# Patient Record
Sex: Female | Born: 1935 | Race: White | Hispanic: No | Marital: Married | Smoking: Former smoker
Health system: Southern US, Community
[De-identification: ages and names within clinical notes are randomized; demographics above are authoritative.]

## PROBLEM LIST (undated history)

## (undated) DIAGNOSIS — K5520 Angiodysplasia of colon without hemorrhage: Secondary | ICD-10-CM

## (undated) DIAGNOSIS — G500 Trigeminal neuralgia: Secondary | ICD-10-CM

## (undated) DIAGNOSIS — L03116 Cellulitis of left lower limb: Secondary | ICD-10-CM

## (undated) DIAGNOSIS — N183 Chronic kidney disease, stage 3 unspecified: Secondary | ICD-10-CM

## (undated) DIAGNOSIS — D369 Benign neoplasm, unspecified site: Secondary | ICD-10-CM

## (undated) DIAGNOSIS — I739 Peripheral vascular disease, unspecified: Secondary | ICD-10-CM

## (undated) DIAGNOSIS — I429 Cardiomyopathy, unspecified: Secondary | ICD-10-CM

## (undated) DIAGNOSIS — A498 Other bacterial infections of unspecified site: Secondary | ICD-10-CM

## (undated) DIAGNOSIS — Q279 Congenital malformation of peripheral vascular system, unspecified: Secondary | ICD-10-CM

## (undated) DIAGNOSIS — T8859XA Other complications of anesthesia, initial encounter: Secondary | ICD-10-CM

## (undated) DIAGNOSIS — I10 Essential (primary) hypertension: Secondary | ICD-10-CM

## (undated) DIAGNOSIS — C539 Malignant neoplasm of cervix uteri, unspecified: Secondary | ICD-10-CM

## (undated) DIAGNOSIS — E1122 Type 2 diabetes mellitus with diabetic chronic kidney disease: Secondary | ICD-10-CM

## (undated) DIAGNOSIS — I509 Heart failure, unspecified: Secondary | ICD-10-CM

## (undated) DIAGNOSIS — D509 Iron deficiency anemia, unspecified: Secondary | ICD-10-CM

## (undated) DIAGNOSIS — E785 Hyperlipidemia, unspecified: Secondary | ICD-10-CM

## (undated) DIAGNOSIS — R945 Abnormal results of liver function studies: Secondary | ICD-10-CM

## (undated) DIAGNOSIS — M1990 Unspecified osteoarthritis, unspecified site: Secondary | ICD-10-CM

## (undated) DIAGNOSIS — D7589 Other specified diseases of blood and blood-forming organs: Secondary | ICD-10-CM

## (undated) DIAGNOSIS — I2510 Atherosclerotic heart disease of native coronary artery without angina pectoris: Secondary | ICD-10-CM

## (undated) DIAGNOSIS — Z9289 Personal history of other medical treatment: Secondary | ICD-10-CM

## (undated) DIAGNOSIS — K589 Irritable bowel syndrome without diarrhea: Secondary | ICD-10-CM

## (undated) DIAGNOSIS — R0989 Other specified symptoms and signs involving the circulatory and respiratory systems: Secondary | ICD-10-CM

## (undated) DIAGNOSIS — K760 Fatty (change of) liver, not elsewhere classified: Secondary | ICD-10-CM

## (undated) DIAGNOSIS — Z720 Tobacco use: Secondary | ICD-10-CM

## (undated) DIAGNOSIS — I350 Nonrheumatic aortic (valve) stenosis: Secondary | ICD-10-CM

## (undated) DIAGNOSIS — K219 Gastro-esophageal reflux disease without esophagitis: Secondary | ICD-10-CM

## (undated) DIAGNOSIS — J449 Chronic obstructive pulmonary disease, unspecified: Secondary | ICD-10-CM

## (undated) DIAGNOSIS — M109 Gout, unspecified: Secondary | ICD-10-CM

## (undated) DIAGNOSIS — T4145XA Adverse effect of unspecified anesthetic, initial encounter: Secondary | ICD-10-CM

## (undated) DIAGNOSIS — K279 Peptic ulcer, site unspecified, unspecified as acute or chronic, without hemorrhage or perforation: Secondary | ICD-10-CM

## (undated) DIAGNOSIS — N6019 Diffuse cystic mastopathy of unspecified breast: Secondary | ICD-10-CM

## (undated) DIAGNOSIS — H348122 Central retinal vein occlusion, left eye, stable: Secondary | ICD-10-CM

## (undated) DIAGNOSIS — L03115 Cellulitis of right lower limb: Secondary | ICD-10-CM

## (undated) DIAGNOSIS — K922 Gastrointestinal hemorrhage, unspecified: Secondary | ICD-10-CM

## (undated) DIAGNOSIS — R609 Edema, unspecified: Secondary | ICD-10-CM

## (undated) DIAGNOSIS — Z87891 Personal history of nicotine dependence: Secondary | ICD-10-CM

## (undated) DIAGNOSIS — R7989 Other specified abnormal findings of blood chemistry: Secondary | ICD-10-CM

## (undated) HISTORY — DX: Angiodysplasia of colon without hemorrhage: K55.20

## (undated) HISTORY — DX: Cellulitis of right lower limb: L03.115

## (undated) HISTORY — PX: NASAL SINUS SURGERY: SHX719

## (undated) HISTORY — DX: Other bacterial infections of unspecified site: A49.8

## (undated) HISTORY — DX: Fatty (change of) liver, not elsewhere classified: K76.0

## (undated) HISTORY — DX: Gastro-esophageal reflux disease without esophagitis: K21.9

## (undated) HISTORY — DX: Type 2 diabetes mellitus with diabetic chronic kidney disease: E11.22

## (undated) HISTORY — DX: Congenital malformation of peripheral vascular system, unspecified: Q27.9

## (undated) HISTORY — DX: Iron deficiency anemia, unspecified: D50.9

## (undated) HISTORY — DX: Chronic kidney disease, stage 3 unspecified: N18.30

## (undated) HISTORY — DX: Irritable bowel syndrome, unspecified: K58.9

## (undated) HISTORY — DX: Benign neoplasm, unspecified site: D36.9

## (undated) HISTORY — DX: Diffuse cystic mastopathy of unspecified breast: N60.19

## (undated) HISTORY — DX: Malignant neoplasm of cervix uteri, unspecified: C53.9

## (undated) HISTORY — DX: Peripheral vascular disease, unspecified: I73.9

## (undated) HISTORY — DX: Gastrointestinal hemorrhage, unspecified: K92.2

## (undated) HISTORY — DX: Personal history of nicotine dependence: Z87.891

## (undated) HISTORY — DX: Atherosclerotic heart disease of native coronary artery without angina pectoris: I25.10

## (undated) HISTORY — DX: Edema, unspecified: R60.9

## (undated) HISTORY — DX: Chronic obstructive pulmonary disease, unspecified: J44.9

## (undated) HISTORY — DX: Other specified symptoms and signs involving the circulatory and respiratory systems: R09.89

## (undated) HISTORY — PX: OTHER SURGICAL HISTORY: SHX169

## (undated) HISTORY — DX: Hyperlipidemia, unspecified: E78.5

## (undated) HISTORY — DX: Gout, unspecified: M10.9

## (undated) HISTORY — PX: CHOLECYSTECTOMY: SHX55

## (undated) HISTORY — DX: Abnormal results of liver function studies: R94.5

## (undated) HISTORY — PX: ESOPHAGOGASTRODUODENOSCOPY: SHX1529

## (undated) HISTORY — DX: Cardiomyopathy, unspecified: I42.9

## (undated) HISTORY — DX: Central retinal vein occlusion, left eye, stable: H34.8122

## (undated) HISTORY — DX: Tobacco use: Z72.0

## (undated) HISTORY — DX: Other specified diseases of blood and blood-forming organs: D75.89

## (undated) HISTORY — DX: Nonrheumatic aortic (valve) stenosis: I35.0

## (undated) HISTORY — DX: Trigeminal neuralgia: G50.0

## (undated) HISTORY — PX: ABDOMINAL HYSTERECTOMY: SHX81

## (undated) HISTORY — DX: Cellulitis of left lower limb: L03.116

## (undated) HISTORY — DX: Peptic ulcer, site unspecified, unspecified as acute or chronic, without hemorrhage or perforation: K27.9

## (undated) HISTORY — DX: Heart failure, unspecified: I50.9

## (undated) HISTORY — DX: Chronic kidney disease, stage 3 (moderate): N18.3

## (undated) HISTORY — DX: Personal history of other medical treatment: Z92.89

## (undated) HISTORY — PX: TONSILLECTOMY: SUR1361

## (undated) HISTORY — DX: Other specified abnormal findings of blood chemistry: R79.89

## (undated) HISTORY — PX: THORACENTESIS: SHX235

## (undated) HISTORY — DX: Unspecified osteoarthritis, unspecified site: M19.90

## (undated) HISTORY — PX: COLONOSCOPY: SHX174

---

## 1997-06-14 DIAGNOSIS — Q279 Congenital malformation of peripheral vascular system, unspecified: Secondary | ICD-10-CM

## 1997-06-14 HISTORY — DX: Congenital malformation of peripheral vascular system, unspecified: Q27.9

## 1997-12-23 HISTORY — PX: BONE MARROW BIOPSY: SHX199

## 2005-12-11 ENCOUNTER — Ambulatory Visit: Payer: Self-pay | Admitting: Internal Medicine

## 2006-10-30 ENCOUNTER — Ambulatory Visit: Payer: Self-pay | Admitting: Internal Medicine

## 2006-12-23 DIAGNOSIS — D509 Iron deficiency anemia, unspecified: Secondary | ICD-10-CM

## 2006-12-23 HISTORY — DX: Iron deficiency anemia, unspecified: D50.9

## 2007-03-17 ENCOUNTER — Ambulatory Visit: Payer: Self-pay | Admitting: Internal Medicine

## 2007-03-24 ENCOUNTER — Ambulatory Visit: Payer: Self-pay | Admitting: Internal Medicine

## 2007-04-23 ENCOUNTER — Ambulatory Visit: Payer: Self-pay | Admitting: Internal Medicine

## 2007-05-06 ENCOUNTER — Ambulatory Visit: Payer: Self-pay | Admitting: Internal Medicine

## 2007-05-24 ENCOUNTER — Ambulatory Visit: Payer: Self-pay | Admitting: Internal Medicine

## 2007-06-23 ENCOUNTER — Ambulatory Visit: Payer: Self-pay | Admitting: Internal Medicine

## 2007-07-24 ENCOUNTER — Ambulatory Visit: Payer: Self-pay | Admitting: Internal Medicine

## 2010-11-01 ENCOUNTER — Ambulatory Visit: Payer: Self-pay | Admitting: Ophthalmology

## 2010-11-12 ENCOUNTER — Ambulatory Visit: Payer: Self-pay | Admitting: Ophthalmology

## 2011-07-03 ENCOUNTER — Ambulatory Visit: Payer: Self-pay | Admitting: Internal Medicine

## 2012-02-07 ENCOUNTER — Observation Stay: Payer: Self-pay | Admitting: Internal Medicine

## 2012-02-17 ENCOUNTER — Ambulatory Visit: Payer: Self-pay | Admitting: Internal Medicine

## 2012-02-17 LAB — CBC CANCER CENTER
Eosinophil #: 0.1 x10 3/mm (ref 0.0–0.7)
Eosinophil: 2 %
Lymphocyte %: 11.2 %
Lymphocytes: 11 %
MCH: 27.3 pg (ref 26.0–34.0)
MCHC: 32.8 g/dL (ref 32.0–36.0)
Monocyte %: 8.4 %
Monocytes: 7 %
Neutrophil %: 78.9 %
Platelet: 144 x10 3/mm — ABNORMAL LOW (ref 150–440)
RBC: 2.69 10*6/uL — ABNORMAL LOW (ref 3.80–5.20)
RDW: 17.1 % — ABNORMAL HIGH (ref 11.5–14.5)
WBC: 5.7 x10 3/mm (ref 3.6–11.0)

## 2012-02-17 LAB — FERRITIN: Ferritin (ARMC): 9 ng/mL (ref 8–388)

## 2012-02-17 LAB — RETICULOCYTES
Absolute Retic Count: 0.119 10*6/uL — ABNORMAL HIGH (ref 0.024–0.084)
Reticulocyte: 4.4 % — ABNORMAL HIGH (ref 0.5–1.5)

## 2012-02-17 LAB — LACTATE DEHYDROGENASE: LDH: 193 U/L (ref 84–246)

## 2012-02-17 LAB — IRON AND TIBC
Iron Bind.Cap.(Total): 461 ug/dL — ABNORMAL HIGH (ref 250–450)
Iron Saturation: 8 %
Unbound Iron-Bind.Cap.: 425 ug/dL

## 2012-02-18 LAB — CANCER CENTER HEMOGLOBIN: HGB: 8.2 g/dL — ABNORMAL LOW (ref 12.0–16.0)

## 2012-02-18 LAB — URINE IEP, RANDOM

## 2012-02-18 LAB — PROT IMMUNOELECTROPHORES(ARMC)

## 2012-02-21 ENCOUNTER — Ambulatory Visit: Payer: Self-pay | Admitting: Internal Medicine

## 2012-02-24 ENCOUNTER — Inpatient Hospital Stay: Payer: Self-pay | Admitting: Internal Medicine

## 2012-02-24 LAB — COMPREHENSIVE METABOLIC PANEL
Albumin: 3.1 g/dL — ABNORMAL LOW (ref 3.4–5.0)
Alkaline Phosphatase: 79 U/L (ref 50–136)
Anion Gap: 12 (ref 7–16)
BUN: 25 mg/dL — ABNORMAL HIGH (ref 7–18)
Bilirubin,Total: 0.5 mg/dL (ref 0.2–1.0)
Calcium, Total: 9.2 mg/dL (ref 8.5–10.1)
Chloride: 99 mmol/L (ref 98–107)
EGFR (African American): 46 — ABNORMAL LOW
EGFR (Non-African Amer.): 38 — ABNORMAL LOW
Potassium: 4.8 mmol/L (ref 3.5–5.1)
SGOT(AST): 18 U/L (ref 15–37)
SGPT (ALT): 20 U/L

## 2012-02-24 LAB — CBC WITH DIFFERENTIAL/PLATELET
Basophil #: 0 10*3/uL (ref 0.0–0.1)
Basophil %: 0.4 %
Eosinophil #: 0 10*3/uL (ref 0.0–0.7)
Eosinophil %: 0.5 %
Lymphocyte %: 11.4 %
MCH: 27.8 pg (ref 26.0–34.0)
Neutrophil #: 4.7 10*3/uL (ref 1.4–6.5)
Neutrophil %: 79 %
Platelet: 136 10*3/uL — ABNORMAL LOW (ref 150–440)
RBC: 2.53 10*6/uL — ABNORMAL LOW (ref 3.80–5.20)
RDW: 18.1 % — ABNORMAL HIGH (ref 11.5–14.5)
WBC: 5.9 10*3/uL (ref 3.6–11.0)

## 2012-02-24 LAB — PROTIME-INR
INR: 1
Prothrombin Time: 13.2 secs (ref 11.5–14.7)

## 2012-02-24 LAB — HEMOGLOBIN: HGB: 7.8 g/dL — ABNORMAL LOW (ref 12.0–16.0)

## 2012-02-25 LAB — CBC WITH DIFFERENTIAL/PLATELET
Basophil %: 0.8 %
Eosinophil %: 1.2 %
HCT: 26.4 % — ABNORMAL LOW (ref 35.0–47.0)
HGB: 8.7 g/dL — ABNORMAL LOW (ref 12.0–16.0)
Lymphocyte #: 0.8 10*3/uL — ABNORMAL LOW (ref 1.0–3.6)
MCH: 27.5 pg (ref 26.0–34.0)
MCHC: 32.8 g/dL (ref 32.0–36.0)
MCV: 84 fL (ref 80–100)
Monocyte #: 0.4 10*3/uL (ref 0.0–0.7)
Neutrophil #: 3.1 10*3/uL (ref 1.4–6.5)
Platelet: 120 10*3/uL — ABNORMAL LOW (ref 150–440)
RBC: 3.15 10*6/uL — ABNORMAL LOW (ref 3.80–5.20)

## 2012-02-26 ENCOUNTER — Ambulatory Visit: Payer: Self-pay | Admitting: Internal Medicine

## 2012-03-02 LAB — CANCER CENTER HEMOGLOBIN: HGB: 9.2 g/dL — ABNORMAL LOW (ref 12.0–16.0)

## 2012-03-05 ENCOUNTER — Ambulatory Visit: Payer: Self-pay | Admitting: Unknown Physician Specialty

## 2012-03-05 DIAGNOSIS — D369 Benign neoplasm, unspecified site: Secondary | ICD-10-CM

## 2012-03-05 HISTORY — DX: Benign neoplasm, unspecified site: D36.9

## 2012-03-05 LAB — HEMOGLOBIN: HGB: 8.9 g/dL — ABNORMAL LOW (ref 12.0–16.0)

## 2012-03-09 LAB — PATHOLOGY REPORT

## 2012-03-13 LAB — BASIC METABOLIC PANEL
BUN: 14 mg/dL (ref 7–18)
Co2: 30 mmol/L (ref 21–32)
Creatinine: 1.35 mg/dL — ABNORMAL HIGH (ref 0.60–1.30)
EGFR (African American): 49 — ABNORMAL LOW
EGFR (Non-African Amer.): 41 — ABNORMAL LOW
Glucose: 191 mg/dL — ABNORMAL HIGH (ref 65–99)
Osmolality: 285 (ref 275–301)
Potassium: 5 mmol/L (ref 3.5–5.1)
Sodium: 140 mmol/L (ref 136–145)

## 2012-03-13 LAB — CANCER CENTER HEMOGLOBIN: HGB: 9 g/dL — ABNORMAL LOW (ref 12.0–16.0)

## 2012-03-23 ENCOUNTER — Ambulatory Visit: Payer: Self-pay | Admitting: Internal Medicine

## 2012-03-26 LAB — CBC CANCER CENTER
Basophil %: 0.8 %
Eosinophil %: 1.5 %
HCT: 26.3 % — ABNORMAL LOW (ref 35.0–47.0)
Lymphocyte #: 0.7 x10 3/mm — ABNORMAL LOW (ref 1.0–3.6)
Lymphocyte %: 11.2 %
MCH: 28.8 pg (ref 26.0–34.0)
MCHC: 33.3 g/dL (ref 32.0–36.0)
MCV: 87 fL (ref 80–100)
Monocyte %: 8.6 %
Neutrophil #: 5 x10 3/mm (ref 1.4–6.5)
Platelet: 170 x10 3/mm (ref 150–440)
RBC: 3.04 10*6/uL — ABNORMAL LOW (ref 3.80–5.20)
RDW: 20.4 % — ABNORMAL HIGH (ref 11.5–14.5)
WBC: 6.4 x10 3/mm (ref 3.6–11.0)

## 2012-04-07 LAB — IRON AND TIBC: Unbound Iron-Bind.Cap.: 341 ug/dL

## 2012-04-07 LAB — OCCULT BLOOD X 1 CARD TO LAB, STOOL: Occult Blood, Feces: POSITIVE

## 2012-04-09 LAB — RETICULOCYTES: Reticulocyte: 4.22 % — ABNORMAL HIGH (ref 0.5–1.5)

## 2012-04-09 LAB — CANCER CENTER HEMOGLOBIN: HGB: 8.4 g/dL — ABNORMAL LOW (ref 12.0–16.0)

## 2012-04-10 ENCOUNTER — Observation Stay: Payer: Self-pay | Admitting: Internal Medicine

## 2012-04-13 LAB — RETICULOCYTES
Absolute Retic Count: 0.1421 10*6/uL — ABNORMAL HIGH (ref 0.024–0.084)
Reticulocyte: 4.07 % — ABNORMAL HIGH (ref 0.5–1.5)

## 2012-04-13 LAB — CANCER CENTER HEMOGLOBIN: HGB: 10.4 g/dL — ABNORMAL LOW (ref 12.0–16.0)

## 2012-04-22 ENCOUNTER — Ambulatory Visit: Payer: Self-pay | Admitting: Internal Medicine

## 2012-04-22 LAB — CANCER CENTER HEMOGLOBIN: HGB: 9.7 g/dL — ABNORMAL LOW (ref 12.0–16.0)

## 2012-04-30 LAB — CREATININE, SERUM: EGFR (African American): 46 — ABNORMAL LOW

## 2012-04-30 LAB — CANCER CENTER HEMOGLOBIN: HGB: 7.7 g/dL — ABNORMAL LOW (ref 12.0–16.0)

## 2012-04-30 LAB — RETICULOCYTES
Absolute Retic Count: 0.1854 10*6/uL — ABNORMAL HIGH (ref 0.024–0.084)
Reticulocyte: 7.45 % — ABNORMAL HIGH (ref 0.5–1.5)

## 2012-05-06 LAB — CANCER CENTER HEMOGLOBIN: HGB: 9.6 g/dL — ABNORMAL LOW (ref 12.0–16.0)

## 2012-05-08 LAB — LACTATE DEHYDROGENASE: LDH: 222 U/L (ref 84–246)

## 2012-05-08 LAB — CANCER CENTER HEMOGLOBIN: HGB: 9.8 g/dL — ABNORMAL LOW (ref 12.0–16.0)

## 2012-05-19 LAB — BILIRUBIN, DIRECT: Bilirubin, Direct: 0.2 mg/dL (ref 0.00–0.20)

## 2012-05-19 LAB — CREATININE, SERUM
Creatinine: 1.68 mg/dL — ABNORMAL HIGH (ref 0.60–1.30)
EGFR (Non-African Amer.): 30 — ABNORMAL LOW

## 2012-05-22 LAB — CANCER CENTER HEMOGLOBIN: HGB: 10.2 g/dL — ABNORMAL LOW (ref 12.0–16.0)

## 2012-05-23 ENCOUNTER — Ambulatory Visit: Payer: Self-pay | Admitting: Internal Medicine

## 2012-05-28 LAB — CANCER CENTER HEMOGLOBIN: HGB: 9.2 g/dL — ABNORMAL LOW (ref 12.0–16.0)

## 2012-05-28 LAB — CREATININE, SERUM
Creatinine: 1.44 mg/dL — ABNORMAL HIGH (ref 0.60–1.30)
EGFR (African American): 41 — ABNORMAL LOW

## 2012-06-01 LAB — RETICULOCYTES
Absolute Retic Count: 0.0909 10*6/uL — ABNORMAL HIGH (ref 0.024–0.084)
Reticulocyte: 2.92 % — ABNORMAL HIGH (ref 0.5–1.5)

## 2012-06-01 LAB — IRON AND TIBC
Iron Saturation: 15 %
Iron: 71 ug/dL (ref 50–170)
Unbound Iron-Bind.Cap.: 395 ug/dL

## 2012-06-01 LAB — BILIRUBIN, TOTAL: Bilirubin,Total: 0.4 mg/dL (ref 0.2–1.0)

## 2012-06-01 LAB — FERRITIN: Ferritin (ARMC): 26 ng/mL (ref 8–388)

## 2012-06-01 LAB — CANCER CENTER HEMOGLOBIN: HGB: 9.4 g/dL — ABNORMAL LOW (ref 12.0–16.0)

## 2012-06-04 LAB — CANCER CENTER HEMOGLOBIN: HGB: 11.7 g/dL — ABNORMAL LOW (ref 12.0–16.0)

## 2012-06-11 LAB — CBC CANCER CENTER
Basophil #: 0 x10 3/mm (ref 0.0–0.1)
Basophil %: 0.9 %
Eosinophil #: 0 x10 3/mm (ref 0.0–0.7)
Lymphocyte %: 15.4 %
MCH: 30.1 pg (ref 26.0–34.0)
Monocyte #: 0.4 x10 3/mm (ref 0.2–0.9)
Neutrophil #: 3.4 x10 3/mm (ref 1.4–6.5)
RDW: 17.4 % — ABNORMAL HIGH (ref 11.5–14.5)
WBC: 4.6 x10 3/mm (ref 3.6–11.0)

## 2012-06-11 LAB — BASIC METABOLIC PANEL
Anion Gap: 7 (ref 7–16)
BUN: 38 mg/dL — ABNORMAL HIGH (ref 7–18)
Chloride: 101 mmol/L (ref 98–107)
EGFR (Non-African Amer.): 30 — ABNORMAL LOW
Glucose: 133 mg/dL — ABNORMAL HIGH (ref 65–99)
Osmolality: 287 (ref 275–301)
Potassium: 4.9 mmol/L (ref 3.5–5.1)
Sodium: 138 mmol/L (ref 136–145)

## 2012-06-11 LAB — LIPID PANEL
Cholesterol: 123 mg/dL (ref 0–200)
HDL Cholesterol: 51 mg/dL (ref 40–60)
Ldl Cholesterol, Calc: 49 mg/dL (ref 0–100)
Triglycerides: 115 mg/dL (ref 0–200)
VLDL Cholesterol, Calc: 23 mg/dL (ref 5–40)

## 2012-06-11 LAB — HEMOGLOBIN A1C: Hemoglobin A1C: 6.1 % (ref 4.2–6.3)

## 2012-06-11 LAB — HEPATIC FUNCTION PANEL A (ARMC)
Alkaline Phosphatase: 128 U/L (ref 50–136)
Bilirubin, Direct: 0.1 mg/dL (ref 0.00–0.20)
Bilirubin,Total: 0.5 mg/dL (ref 0.2–1.0)
SGPT (ALT): 28 U/L

## 2012-06-11 LAB — CANCER CENTER HEMOGLOBIN: HGB: 10.8 g/dL — ABNORMAL LOW (ref 12.0–16.0)

## 2012-06-16 LAB — CANCER CENTER HEMOGLOBIN: HGB: 10.8 g/dL — ABNORMAL LOW (ref 12.0–16.0)

## 2012-06-22 ENCOUNTER — Ambulatory Visit: Payer: Self-pay | Admitting: Internal Medicine

## 2012-06-22 LAB — CANCER CENTER HEMOGLOBIN: HGB: 10.7 g/dL — ABNORMAL LOW (ref 12.0–16.0)

## 2012-06-29 LAB — CANCER CENTER HEMOGLOBIN: HGB: 9.9 g/dL — ABNORMAL LOW (ref 12.0–16.0)

## 2012-07-02 ENCOUNTER — Ambulatory Visit: Payer: Self-pay | Admitting: Surgery

## 2012-07-03 LAB — RETICULOCYTES: Reticulocyte: 5.31 % — ABNORMAL HIGH (ref 0.5–1.5)

## 2012-07-08 LAB — CANCER CENTER HEMOGLOBIN: HGB: 10.5 g/dL — ABNORMAL LOW (ref 12.0–16.0)

## 2012-07-08 LAB — POTASSIUM: Potassium: 4.7 mmol/L (ref 3.5–5.1)

## 2012-07-08 LAB — CREATININE, SERUM
Creatinine: 1.65 mg/dL — ABNORMAL HIGH (ref 0.60–1.30)
EGFR (Non-African Amer.): 30 — ABNORMAL LOW

## 2012-07-08 LAB — PROTIME-INR
INR: 0.9
Prothrombin Time: 12.1 secs (ref 11.5–14.7)

## 2012-07-08 LAB — FERRITIN: Ferritin (ARMC): 147 ng/mL (ref 8–388)

## 2012-07-08 LAB — CANCER CTR PLATELET CT: Platelet: 131 x10 3/mm — ABNORMAL LOW (ref 150–440)

## 2012-07-10 ENCOUNTER — Ambulatory Visit: Payer: Self-pay | Admitting: Surgery

## 2012-07-15 LAB — CANCER CENTER HEMOGLOBIN: HGB: 10.7 g/dL — ABNORMAL LOW (ref 12.0–16.0)

## 2012-07-23 ENCOUNTER — Ambulatory Visit: Payer: Self-pay | Admitting: Internal Medicine

## 2012-08-04 LAB — FERRITIN: Ferritin (ARMC): 40 ng/mL (ref 8–388)

## 2012-08-04 LAB — CANCER CENTER HEMOGLOBIN: HGB: 10.4 g/dL — ABNORMAL LOW (ref 12.0–16.0)

## 2012-08-12 LAB — CANCER CENTER HEMOGLOBIN: HGB: 9.2 g/dL — ABNORMAL LOW (ref 12.0–16.0)

## 2012-08-18 LAB — CBC CANCER CENTER
Basophil #: 0.1 x10 3/mm (ref 0.0–0.1)
Eosinophil #: 0.1 x10 3/mm (ref 0.0–0.7)
HCT: 30.3 % — ABNORMAL LOW (ref 35.0–47.0)
Lymphocyte #: 0.7 x10 3/mm — ABNORMAL LOW (ref 1.0–3.6)
Lymphocyte %: 15.2 %
MCH: 34.4 pg — ABNORMAL HIGH (ref 26.0–34.0)
MCHC: 33 g/dL (ref 32.0–36.0)
MCV: 104 fL — ABNORMAL HIGH (ref 80–100)
Monocyte %: 10.2 %
Neutrophil #: 3.2 x10 3/mm (ref 1.4–6.5)
Neutrophil %: 71.5 %
Platelet: 143 x10 3/mm — ABNORMAL LOW (ref 150–440)
RDW: 18 % — ABNORMAL HIGH (ref 11.5–14.5)
WBC: 4.5 x10 3/mm (ref 3.6–11.0)

## 2012-08-23 ENCOUNTER — Ambulatory Visit: Payer: Self-pay | Admitting: Internal Medicine

## 2012-08-28 LAB — CBC CANCER CENTER
Basophil #: 0.1 x10 3/mm (ref 0.0–0.1)
Eosinophil #: 0.1 x10 3/mm (ref 0.0–0.7)
HCT: 33.2 % — ABNORMAL LOW (ref 35.0–47.0)
Lymphocyte #: 0.9 x10 3/mm — ABNORMAL LOW (ref 1.0–3.6)
Lymphocyte %: 15.2 %
MCHC: 33.3 g/dL (ref 32.0–36.0)
Monocyte #: 0.6 x10 3/mm (ref 0.2–0.9)
Neutrophil #: 4.1 x10 3/mm (ref 1.4–6.5)
Neutrophil %: 72 %
RDW: 16.5 % — ABNORMAL HIGH (ref 11.5–14.5)

## 2012-09-11 LAB — CANCER CENTER HEMOGLOBIN: HGB: 9.9 g/dL — ABNORMAL LOW (ref 12.0–16.0)

## 2012-09-11 LAB — FERRITIN: Ferritin (ARMC): 47 ng/mL (ref 8–388)

## 2012-09-22 ENCOUNTER — Ambulatory Visit: Payer: Self-pay | Admitting: Internal Medicine

## 2012-10-14 LAB — CANCER CENTER HEMOGLOBIN: HGB: 9.7 g/dL — ABNORMAL LOW (ref 12.0–16.0)

## 2012-10-22 LAB — FERRITIN: Ferritin (ARMC): 104 ng/mL (ref 8–388)

## 2012-10-23 ENCOUNTER — Ambulatory Visit: Payer: Self-pay | Admitting: Internal Medicine

## 2012-11-02 LAB — CANCER CENTER HEMOGLOBIN: HGB: 10.3 g/dL — ABNORMAL LOW (ref 12.0–16.0)

## 2012-11-16 LAB — CANCER CENTER HEMOGLOBIN: HGB: 7.5 g/dL — ABNORMAL LOW (ref 12.0–16.0)

## 2012-11-20 LAB — CANCER CENTER HEMOGLOBIN: HGB: 9.2 g/dL — ABNORMAL LOW (ref 12.0–16.0)

## 2012-11-22 ENCOUNTER — Ambulatory Visit: Payer: Self-pay | Admitting: Internal Medicine

## 2012-11-25 LAB — CANCER CENTER HEMOGLOBIN: HGB: 10.1 g/dL — ABNORMAL LOW (ref 12.0–16.0)

## 2012-12-14 LAB — FERRITIN: Ferritin (ARMC): 41 ng/mL (ref 8–388)

## 2012-12-23 ENCOUNTER — Ambulatory Visit: Payer: Self-pay | Admitting: Internal Medicine

## 2012-12-25 LAB — CBC CANCER CENTER
Basophil #: 0 x10 3/mm (ref 0.0–0.1)
Eosinophil #: 0.1 x10 3/mm (ref 0.0–0.7)
Eosinophil %: 1.3 %
HCT: 23 % — ABNORMAL LOW (ref 35.0–47.0)
Lymphocyte %: 13.1 %
MCH: 34.1 pg — ABNORMAL HIGH (ref 26.0–34.0)
MCV: 101 fL — ABNORMAL HIGH (ref 80–100)
Neutrophil %: 75.6 %
Platelet: 150 x10 3/mm (ref 150–440)
WBC: 4.6 x10 3/mm (ref 3.6–11.0)

## 2012-12-31 LAB — CBC CANCER CENTER
Basophil #: 0 x10 3/mm (ref 0.0–0.1)
Basophil %: 0.7 %
Eosinophil #: 0.1 x10 3/mm (ref 0.0–0.7)
Eosinophil %: 2 %
HGB: 9.1 g/dL — ABNORMAL LOW (ref 12.0–16.0)
Lymphocyte #: 0.4 x10 3/mm — ABNORMAL LOW (ref 1.0–3.6)
Lymphocyte %: 10 %
MCH: 32.6 pg (ref 26.0–34.0)
MCV: 97 fL (ref 80–100)
Monocyte #: 0.6 x10 3/mm (ref 0.2–0.9)
Monocyte %: 15.4 %
Platelet: 127 x10 3/mm — ABNORMAL LOW (ref 150–440)

## 2013-01-11 LAB — CBC CANCER CENTER
Eosinophil #: 0.1 x10 3/mm (ref 0.0–0.7)
HCT: 30.5 % — ABNORMAL LOW (ref 35.0–47.0)
Lymphocyte %: 10.3 %
MCHC: 33.3 g/dL (ref 32.0–36.0)
MCV: 95 fL (ref 80–100)
Monocyte #: 0.5 x10 3/mm (ref 0.2–0.9)
Monocyte %: 8.2 %
Neutrophil #: 4.6 x10 3/mm (ref 1.4–6.5)
Neutrophil %: 78.2 %
WBC: 5.8 x10 3/mm (ref 3.6–11.0)

## 2013-01-11 LAB — IRON AND TIBC
Iron Saturation: 22 %
Iron: 79 ug/dL (ref 50–170)

## 2013-01-11 LAB — FERRITIN: Ferritin (ARMC): 57 ng/mL (ref 8–388)

## 2013-01-22 ENCOUNTER — Inpatient Hospital Stay: Payer: Self-pay | Admitting: Internal Medicine

## 2013-01-22 LAB — CREATININE, SERUM
Creatinine: 1.48 mg/dL — ABNORMAL HIGH (ref 0.60–1.30)
EGFR (African American): 40 — ABNORMAL LOW
EGFR (Non-African Amer.): 34 — ABNORMAL LOW

## 2013-01-22 LAB — CBC WITH DIFFERENTIAL/PLATELET
Basophil #: 0.1 10*3/uL (ref 0.0–0.1)
Basophil %: 1.1 %
Eosinophil %: 1.9 %
HGB: 9.1 g/dL — ABNORMAL LOW (ref 12.0–16.0)
Lymphocyte #: 0.6 10*3/uL — ABNORMAL LOW (ref 1.0–3.6)
Lymphocyte %: 12.7 %
MCV: 95 fL (ref 80–100)
Monocyte #: 0.5 x10 3/mm (ref 0.2–0.9)
Neutrophil #: 3.5 10*3/uL (ref 1.4–6.5)
Platelet: 134 10*3/uL — ABNORMAL LOW (ref 150–440)
RDW: 16.2 % — ABNORMAL HIGH (ref 11.5–14.5)
WBC: 4.7 10*3/uL (ref 3.6–11.0)

## 2013-01-22 LAB — HEPATIC FUNCTION PANEL A (ARMC)
Albumin: 3 g/dL — ABNORMAL LOW (ref 3.4–5.0)
Alkaline Phosphatase: 101 U/L (ref 50–136)
Bilirubin,Total: 0.3 mg/dL (ref 0.2–1.0)
SGOT(AST): 24 U/L (ref 15–37)
SGPT (ALT): 23 U/L (ref 12–78)
Total Protein: 6.4 g/dL (ref 6.4–8.2)

## 2013-01-22 LAB — CANCER CENTER HEMOGLOBIN: HGB: 9.3 g/dL — ABNORMAL LOW (ref 12.0–16.0)

## 2013-01-22 LAB — PROTIME-INR: INR: 0.9

## 2013-01-22 LAB — POTASSIUM: Potassium: 4.1 mmol/L (ref 3.5–5.1)

## 2013-01-23 ENCOUNTER — Ambulatory Visit: Payer: Self-pay | Admitting: Internal Medicine

## 2013-01-23 LAB — BASIC METABOLIC PANEL
Chloride: 107 mmol/L (ref 98–107)
Co2: 28 mmol/L (ref 21–32)
EGFR (African American): 45 — ABNORMAL LOW
EGFR (Non-African Amer.): 39 — ABNORMAL LOW

## 2013-01-23 LAB — HEMOGLOBIN: HGB: 10.1 g/dL — ABNORMAL LOW (ref 12.0–16.0)

## 2013-01-24 LAB — CBC WITH DIFFERENTIAL/PLATELET
Basophil %: 1.1 %
Eosinophil #: 0.1 10*3/uL (ref 0.0–0.7)
Eosinophil %: 3.8 %
HCT: 28.6 % — ABNORMAL LOW (ref 35.0–47.0)
Lymphocyte #: 0.6 10*3/uL — ABNORMAL LOW (ref 1.0–3.6)
MCH: 31.3 pg (ref 26.0–34.0)
MCHC: 33.4 g/dL (ref 32.0–36.0)
MCV: 94 fL (ref 80–100)
Monocyte #: 0.4 x10 3/mm (ref 0.2–0.9)
Monocyte %: 11.6 %
Neutrophil %: 67.2 %
Platelet: 128 10*3/uL — ABNORMAL LOW (ref 150–440)
RDW: 17.2 % — ABNORMAL HIGH (ref 11.5–14.5)
WBC: 3.7 10*3/uL (ref 3.6–11.0)

## 2013-01-25 LAB — CBC WITH DIFFERENTIAL/PLATELET
Basophil %: 1.3 %
Eosinophil %: 4.4 %
Lymphocyte #: 0.6 10*3/uL — ABNORMAL LOW (ref 1.0–3.6)
Lymphocyte %: 17.2 %
MCHC: 33.8 g/dL (ref 32.0–36.0)
MCV: 95 fL (ref 80–100)
Monocyte #: 0.5 x10 3/mm (ref 0.2–0.9)
Neutrophil #: 2.4 10*3/uL (ref 1.4–6.5)
Neutrophil %: 64 %
RBC: 3.05 10*6/uL — ABNORMAL LOW (ref 3.80–5.20)
WBC: 3.7 10*3/uL (ref 3.6–11.0)

## 2013-01-26 LAB — BASIC METABOLIC PANEL
Anion Gap: 8 (ref 7–16)
Calcium, Total: 8.7 mg/dL (ref 8.5–10.1)
EGFR (Non-African Amer.): 43 — ABNORMAL LOW
Glucose: 130 mg/dL — ABNORMAL HIGH (ref 65–99)
Osmolality: 283 (ref 275–301)

## 2013-01-26 LAB — HEMOGLOBIN: HGB: 10.4 g/dL — ABNORMAL LOW (ref 12.0–16.0)

## 2013-01-29 ENCOUNTER — Ambulatory Visit: Payer: Self-pay | Admitting: Internal Medicine

## 2013-01-29 LAB — MAGNESIUM: Magnesium: 1.6 mg/dL — ABNORMAL LOW

## 2013-01-29 LAB — POTASSIUM: Potassium: 4.2 mmol/L (ref 3.5–5.1)

## 2013-02-10 LAB — CANCER CENTER HEMOGLOBIN: HGB: 11.1 g/dL — ABNORMAL LOW (ref 12.0–16.0)

## 2013-02-10 LAB — POTASSIUM: Potassium: 3.9 mmol/L (ref 3.5–5.1)

## 2013-02-17 LAB — IRON AND TIBC
Iron Bind.Cap.(Total): 397 ug/dL (ref 250–450)
Iron Saturation: 13 %
Iron: 50 ug/dL (ref 50–170)
Unbound Iron-Bind.Cap.: 347 ug/dL

## 2013-02-17 LAB — MAGNESIUM: Magnesium: 2.2 mg/dL

## 2013-02-17 LAB — FERRITIN: Ferritin (ARMC): 44 ng/mL (ref 8–388)

## 2013-02-18 LAB — CANCER CENTER HEMOGLOBIN: HGB: 9.3 g/dL — ABNORMAL LOW (ref 12.0–16.0)

## 2013-02-19 LAB — CANCER CENTER HEMOGLOBIN: HGB: 10.4 g/dL — ABNORMAL LOW (ref 12.0–16.0)

## 2013-02-20 ENCOUNTER — Ambulatory Visit: Payer: Self-pay | Admitting: Internal Medicine

## 2013-02-22 LAB — CANCER CENTER HEMOGLOBIN: HGB: 10.5 g/dL — ABNORMAL LOW (ref 12.0–16.0)

## 2013-03-01 LAB — CANCER CENTER HEMOGLOBIN: HGB: 9.5 g/dL — ABNORMAL LOW (ref 12.0–16.0)

## 2013-03-05 LAB — CANCER CENTER HEMOGLOBIN: HGB: 11.6 g/dL — ABNORMAL LOW (ref 12.0–16.0)

## 2013-03-22 LAB — CANCER CENTER HEMOGLOBIN: HGB: 10.3 g/dL — ABNORMAL LOW (ref 12.0–16.0)

## 2013-03-23 ENCOUNTER — Ambulatory Visit: Payer: Self-pay | Admitting: Internal Medicine

## 2013-03-26 LAB — CANCER CENTER HEMOGLOBIN: HGB: 10.6 g/dL — ABNORMAL LOW (ref 12.0–16.0)

## 2013-04-08 LAB — CANCER CENTER HEMOGLOBIN: HGB: 10.9 g/dL — ABNORMAL LOW (ref 12.0–16.0)

## 2013-04-15 LAB — CANCER CENTER HEMOGLOBIN: HGB: 10.9 g/dL — ABNORMAL LOW (ref 12.0–16.0)

## 2013-04-22 ENCOUNTER — Ambulatory Visit: Payer: Self-pay | Admitting: Internal Medicine

## 2013-04-26 LAB — FERRITIN: Ferritin (ARMC): 32 ng/mL (ref 8–388)

## 2013-04-29 ENCOUNTER — Ambulatory Visit: Payer: Self-pay | Admitting: Physician Assistant

## 2013-05-06 LAB — BASIC METABOLIC PANEL
BUN: 16 mg/dL (ref 7–18)
Chloride: 98 mmol/L (ref 98–107)
Co2: 36 mmol/L — ABNORMAL HIGH (ref 21–32)
Creatinine: 1.36 mg/dL — ABNORMAL HIGH (ref 0.60–1.30)
EGFR (Non-African Amer.): 38 — ABNORMAL LOW
Glucose: 190 mg/dL — ABNORMAL HIGH (ref 65–99)
Osmolality: 288 (ref 275–301)
Potassium: 3.5 mmol/L (ref 3.5–5.1)
Sodium: 141 mmol/L (ref 136–145)

## 2013-05-06 LAB — FERRITIN: Ferritin (ARMC): 155 ng/mL (ref 8–388)

## 2013-05-19 LAB — CANCER CENTER HEMOGLOBIN: HGB: 11.4 g/dL — ABNORMAL LOW (ref 12.0–16.0)

## 2013-05-23 ENCOUNTER — Ambulatory Visit: Payer: Self-pay | Admitting: Internal Medicine

## 2013-05-26 LAB — BASIC METABOLIC PANEL
Anion Gap: 7 (ref 7–16)
Calcium, Total: 9.1 mg/dL (ref 8.5–10.1)
EGFR (African American): 42 — ABNORMAL LOW
EGFR (Non-African Amer.): 36 — ABNORMAL LOW
Glucose: 173 mg/dL — ABNORMAL HIGH (ref 65–99)
Osmolality: 287 (ref 275–301)
Sodium: 140 mmol/L (ref 136–145)

## 2013-05-26 LAB — CBC CANCER CENTER
Basophil #: 0.1 x10 3/mm (ref 0.0–0.1)
Basophil %: 1.1 %
Eosinophil #: 0.1 x10 3/mm (ref 0.0–0.7)
Eosinophil %: 1.1 %
HCT: 31.6 % — ABNORMAL LOW (ref 35.0–47.0)
HGB: 10.8 g/dL — ABNORMAL LOW (ref 12.0–16.0)
Lymphocyte #: 0.5 x10 3/mm — ABNORMAL LOW (ref 1.0–3.6)
Lymphocyte %: 10.3 %
MCH: 31.8 pg (ref 26.0–34.0)
MCHC: 34.2 g/dL (ref 32.0–36.0)
Monocyte #: 0.5 x10 3/mm (ref 0.2–0.9)
Monocyte %: 10 %
Platelet: 147 x10 3/mm — ABNORMAL LOW (ref 150–440)
RBC: 3.4 10*6/uL — ABNORMAL LOW (ref 3.80–5.20)

## 2013-05-31 ENCOUNTER — Ambulatory Visit: Payer: Self-pay | Admitting: Internal Medicine

## 2013-06-04 ENCOUNTER — Ambulatory Visit: Payer: Self-pay | Admitting: Specialist

## 2013-06-04 LAB — PROTIME-INR
INR: 1
Prothrombin Time: 13.8 secs (ref 11.5–14.7)

## 2013-06-04 LAB — BODY FLUID CELL COUNT WITH DIFFERENTIAL
Neutrophils: 28 %
Nucleated Cell Count: 65 /mm3

## 2013-06-04 LAB — PLATELET COUNT: Platelet: 129 10*3/uL — ABNORMAL LOW (ref 150–440)

## 2013-06-04 LAB — GLUCOSE, SEROUS FLUID: Glucose, Body Fluid: 175 mg/dL

## 2013-06-08 LAB — CANCER CENTER HEMOGLOBIN: HGB: 10.9 g/dL — ABNORMAL LOW (ref 12.0–16.0)

## 2013-06-16 LAB — FERRITIN: Ferritin (ARMC): 129 ng/mL (ref 8–388)

## 2013-06-22 ENCOUNTER — Ambulatory Visit: Payer: Self-pay | Admitting: Internal Medicine

## 2013-07-01 LAB — CANCER CENTER HEMOGLOBIN: HGB: 11.4 g/dL — ABNORMAL LOW (ref 12.0–16.0)

## 2013-07-15 LAB — FERRITIN: Ferritin (ARMC): 43 ng/mL (ref 8–388)

## 2013-07-15 LAB — POTASSIUM: Potassium: 5.1 mmol/L (ref 3.5–5.1)

## 2013-07-22 LAB — FERRITIN: Ferritin (ARMC): 270 ng/mL (ref 8–388)

## 2013-07-23 ENCOUNTER — Ambulatory Visit: Payer: Self-pay | Admitting: Internal Medicine

## 2013-07-23 LAB — BASIC METABOLIC PANEL
BUN: 58 mg/dL — ABNORMAL HIGH (ref 7–18)
Co2: 25 mmol/L (ref 21–32)
Creatinine: 1.77 mg/dL — ABNORMAL HIGH (ref 0.60–1.30)
EGFR (African American): 32 — ABNORMAL LOW
EGFR (Non-African Amer.): 28 — ABNORMAL LOW
Osmolality: 294 (ref 275–301)
Potassium: 5.6 mmol/L — ABNORMAL HIGH (ref 3.5–5.1)
Sodium: 137 mmol/L (ref 136–145)

## 2013-07-23 LAB — CANCER CENTER HEMOGLOBIN: HGB: 8.8 g/dL — ABNORMAL LOW (ref 12.0–16.0)

## 2013-07-28 LAB — CREATININE, SERUM: Creatinine: 2.03 mg/dL — ABNORMAL HIGH (ref 0.60–1.30)

## 2013-07-28 LAB — CANCER CENTER HEMOGLOBIN: HGB: 9.9 g/dL — ABNORMAL LOW (ref 12.0–16.0)

## 2013-07-28 LAB — POTASSIUM: Potassium: 5.3 mmol/L — ABNORMAL HIGH (ref 3.5–5.1)

## 2013-08-02 LAB — FERRITIN: Ferritin (ARMC): 112 ng/mL (ref 8–388)

## 2013-08-02 LAB — CANCER CENTER HEMOGLOBIN: HGB: 10.7 g/dL — ABNORMAL LOW (ref 12.0–16.0)

## 2013-08-09 LAB — CBC CANCER CENTER
Eosinophil #: 0.1 x10 3/mm (ref 0.0–0.7)
MCH: 34.7 pg — ABNORMAL HIGH (ref 26.0–34.0)
MCHC: 35.4 g/dL (ref 32.0–36.0)
MCV: 98 fL (ref 80–100)
Neutrophil #: 3.6 x10 3/mm (ref 1.4–6.5)
Neutrophil %: 75.7 %
Platelet: 132 x10 3/mm — ABNORMAL LOW (ref 150–440)
RDW: 16.1 % — ABNORMAL HIGH (ref 11.5–14.5)
WBC: 4.8 x10 3/mm (ref 3.6–11.0)

## 2013-08-09 LAB — FERRITIN: Ferritin (ARMC): 78 ng/mL (ref 8–388)

## 2013-08-09 LAB — BASIC METABOLIC PANEL
Anion Gap: 4 — ABNORMAL LOW (ref 7–16)
BUN: 53 mg/dL — ABNORMAL HIGH (ref 7–18)
Chloride: 105 mmol/L (ref 98–107)
Co2: 28 mmol/L (ref 21–32)
EGFR (African American): 25 — ABNORMAL LOW
EGFR (Non-African Amer.): 22 — ABNORMAL LOW
Osmolality: 292 (ref 275–301)
Potassium: 4.9 mmol/L (ref 3.5–5.1)
Sodium: 137 mmol/L (ref 136–145)

## 2013-08-20 LAB — CANCER CENTER HEMOGLOBIN: HGB: 10.8 g/dL — ABNORMAL LOW (ref 12.0–16.0)

## 2013-08-23 ENCOUNTER — Ambulatory Visit: Payer: Self-pay | Admitting: Internal Medicine

## 2013-09-05 ENCOUNTER — Emergency Department: Payer: Self-pay | Admitting: Internal Medicine

## 2013-09-05 LAB — COMPREHENSIVE METABOLIC PANEL
Alkaline Phosphatase: 161 U/L — ABNORMAL HIGH (ref 50–136)
Anion Gap: 9 (ref 7–16)
BUN: 30 mg/dL — ABNORMAL HIGH (ref 7–18)
Bilirubin,Total: 0.9 mg/dL (ref 0.2–1.0)
Calcium, Total: 8.7 mg/dL (ref 8.5–10.1)
Chloride: 98 mmol/L (ref 98–107)
EGFR (Non-African Amer.): 29 — ABNORMAL LOW
Glucose: 200 mg/dL — ABNORMAL HIGH (ref 65–99)
Osmolality: 278 (ref 275–301)
Potassium: 3.3 mmol/L — ABNORMAL LOW (ref 3.5–5.1)

## 2013-09-05 LAB — URINALYSIS, COMPLETE
Bilirubin,UR: NEGATIVE
Cellular Cast: 3
Hyaline Cast: 3
Ketone: NEGATIVE
Ph: 5 (ref 4.5–8.0)
Protein: 150
RBC,UR: 15 /HPF (ref 0–5)
Specific Gravity: 1.015 (ref 1.003–1.030)
Squamous Epithelial: 1

## 2013-09-05 LAB — CBC
HGB: 11.9 g/dL — ABNORMAL LOW (ref 12.0–16.0)
Platelet: 127 10*3/uL — ABNORMAL LOW (ref 150–440)
RBC: 3.44 10*6/uL — ABNORMAL LOW (ref 3.80–5.20)
WBC: 9.2 10*3/uL (ref 3.6–11.0)

## 2013-09-05 LAB — LIPASE, BLOOD: Lipase: 107 U/L (ref 73–393)

## 2013-09-13 LAB — FERRITIN: Ferritin (ARMC): 149 ng/mL (ref 8–388)

## 2013-09-13 LAB — CANCER CENTER HEMOGLOBIN: HGB: 11.2 g/dL — ABNORMAL LOW (ref 12.0–16.0)

## 2013-09-22 ENCOUNTER — Ambulatory Visit: Payer: Self-pay | Admitting: Internal Medicine

## 2013-09-29 LAB — CANCER CENTER HEMOGLOBIN: HGB: 11.2 g/dL — ABNORMAL LOW (ref 12.0–16.0)

## 2013-10-13 LAB — HEMOGLOBIN: HGB: 10.9 g/dL — ABNORMAL LOW (ref 12.0–16.0)

## 2013-10-13 LAB — FERRITIN: Ferritin (ARMC): 33 ng/mL (ref 8–388)

## 2013-10-23 ENCOUNTER — Ambulatory Visit: Payer: Self-pay | Admitting: Internal Medicine

## 2013-11-22 ENCOUNTER — Ambulatory Visit: Payer: Self-pay | Admitting: Internal Medicine

## 2013-12-08 LAB — CANCER CENTER HEMOGLOBIN: HGB: 11.4 g/dL — ABNORMAL LOW (ref 12.0–16.0)

## 2013-12-08 LAB — FERRITIN: Ferritin (ARMC): 28 ng/mL (ref 8–388)

## 2013-12-20 LAB — CANCER CENTER HEMOGLOBIN: HGB: 11.7 g/dL — ABNORMAL LOW (ref 12.0–16.0)

## 2013-12-23 ENCOUNTER — Ambulatory Visit: Payer: Self-pay | Admitting: Internal Medicine

## 2013-12-30 LAB — FERRITIN: Ferritin (ARMC): 86 ng/mL (ref 8–388)

## 2013-12-30 LAB — CANCER CENTER HEMOGLOBIN: HGB: 11.8 g/dL — AB (ref 12.0–16.0)

## 2014-01-20 LAB — CANCER CENTER HEMOGLOBIN: HGB: 11.1 g/dL — AB (ref 12.0–16.0)

## 2014-01-20 LAB — FERRITIN: FERRITIN (ARMC): 43 ng/mL (ref 8–388)

## 2014-01-23 ENCOUNTER — Ambulatory Visit: Payer: Self-pay | Admitting: Internal Medicine

## 2014-02-16 LAB — CANCER CENTER HEMOGLOBIN: HGB: 11.7 g/dL — ABNORMAL LOW (ref 12.0–16.0)

## 2014-02-16 LAB — FERRITIN: Ferritin (ARMC): 130 ng/mL (ref 8–388)

## 2014-02-20 ENCOUNTER — Ambulatory Visit: Payer: Self-pay | Admitting: Internal Medicine

## 2014-02-22 IMAGING — CR DG C-ARM 1-60 MIN
1 series · 1 of 1 positions shown · non-contrast
Comparison: none

[cont.]
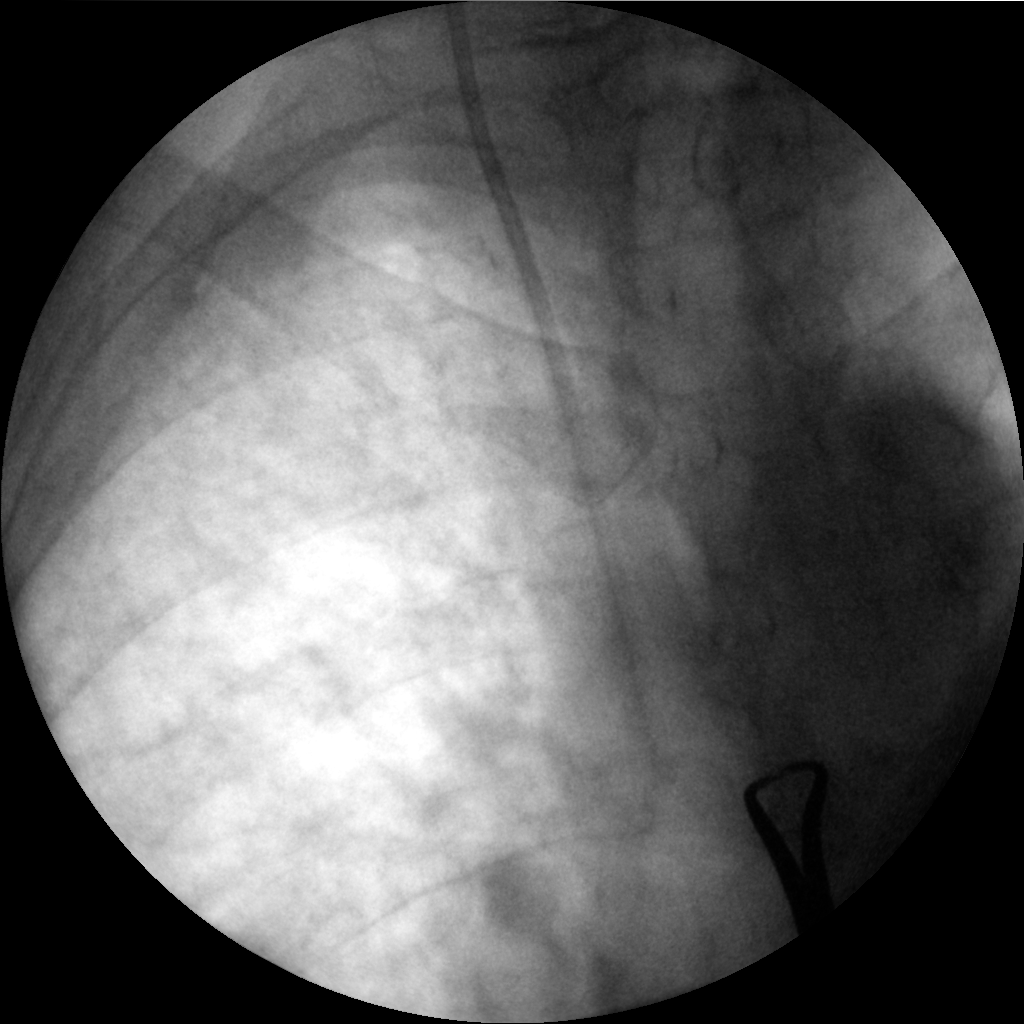

[1 of 1 positions shown; findings below may reference images not displayed]

IMAGES IMPORTED FROM THE [employer] WORKFLOW SYSTEM
NO DICTATION FOR STUDY

## 2014-03-09 LAB — FERRITIN: Ferritin (ARMC): 108 ng/mL (ref 8–388)

## 2014-03-09 LAB — HEMOGLOBIN: HGB: 11.4 g/dL — ABNORMAL LOW (ref 12.0–16.0)

## 2014-03-23 ENCOUNTER — Ambulatory Visit: Payer: Self-pay | Admitting: Internal Medicine

## 2014-03-23 LAB — CBC CANCER CENTER
BASOS ABS: 0.1 x10 3/mm (ref 0.0–0.1)
Basophil %: 1.2 %
EOS ABS: 0.1 x10 3/mm (ref 0.0–0.7)
EOS PCT: 1 %
HCT: 33.7 % — ABNORMAL LOW (ref 35.0–47.0)
HGB: 11.2 g/dL — ABNORMAL LOW (ref 12.0–16.0)
Lymphocyte #: 0.8 x10 3/mm — ABNORMAL LOW (ref 1.0–3.6)
Lymphocyte %: 12.6 %
MCH: 31.7 pg (ref 26.0–34.0)
MCHC: 33.2 g/dL (ref 32.0–36.0)
MCV: 96 fL (ref 80–100)
Monocyte #: 0.5 x10 3/mm (ref 0.2–0.9)
Monocyte %: 7.9 %
Neutrophil #: 4.9 x10 3/mm (ref 1.4–6.5)
Neutrophil %: 77.3 %
Platelet: 166 x10 3/mm (ref 150–440)
RBC: 3.52 10*6/uL — ABNORMAL LOW (ref 3.80–5.20)
RDW: 14.7 % — AB (ref 11.5–14.5)
WBC: 6.3 x10 3/mm (ref 3.6–11.0)

## 2014-03-30 LAB — CANCER CENTER HEMOGLOBIN: HGB: 10.7 g/dL — ABNORMAL LOW (ref 12.0–16.0)

## 2014-03-30 LAB — FERRITIN: FERRITIN (ARMC): 41 ng/mL (ref 8–388)

## 2014-04-08 LAB — CANCER CENTER HEMOGLOBIN: HGB: 10.3 g/dL — ABNORMAL LOW (ref 12.0–16.0)

## 2014-04-22 ENCOUNTER — Ambulatory Visit: Payer: Self-pay | Admitting: Internal Medicine

## 2014-04-22 LAB — FERRITIN: Ferritin (ARMC): 77 ng/mL (ref 8–388)

## 2014-04-22 LAB — CANCER CENTER HEMOGLOBIN: HGB: 9.3 g/dL — ABNORMAL LOW (ref 12.0–16.0)

## 2014-04-28 LAB — CANCER CENTER HEMOGLOBIN: HGB: 10.2 g/dL — AB (ref 12.0–16.0)

## 2014-05-05 LAB — CANCER CENTER HEMOGLOBIN: HGB: 9.9 g/dL — ABNORMAL LOW (ref 12.0–16.0)

## 2014-05-11 LAB — FERRITIN: FERRITIN (ARMC): 191 ng/mL (ref 8–388)

## 2014-05-11 LAB — RETICULOCYTES
ABSOLUTE RETIC COUNT: 0.123 10*6/uL (ref 0.019–0.186)
Reticulocyte: 3.76 % — ABNORMAL HIGH (ref 0.4–3.1)

## 2014-05-11 LAB — CANCER CENTER HEMOGLOBIN: HGB: 10.8 g/dL — ABNORMAL LOW (ref 12.0–16.0)

## 2014-05-18 LAB — CANCER CENTER HEMOGLOBIN: HGB: 10 g/dL — ABNORMAL LOW (ref 12.0–16.0)

## 2014-05-23 ENCOUNTER — Ambulatory Visit: Payer: Self-pay | Admitting: Internal Medicine

## 2014-05-23 LAB — FERRITIN: Ferritin (ARMC): 71 ng/mL (ref 8–388)

## 2014-05-23 LAB — CANCER CENTER HEMOGLOBIN: HGB: 9.4 g/dL — ABNORMAL LOW (ref 12.0–16.0)

## 2014-05-27 LAB — CANCER CENTER HEMOGLOBIN: HGB: 10.7 g/dL — ABNORMAL LOW (ref 12.0–16.0)

## 2014-06-06 LAB — CANCER CENTER HEMOGLOBIN: HGB: 9.9 g/dL — ABNORMAL LOW (ref 12.0–16.0)

## 2014-06-06 LAB — FERRITIN: Ferritin (ARMC): 182 ng/mL (ref 8–388)

## 2014-06-18 DIAGNOSIS — J449 Chronic obstructive pulmonary disease, unspecified: Secondary | ICD-10-CM | POA: Insufficient documentation

## 2014-06-18 DIAGNOSIS — N184 Chronic kidney disease, stage 4 (severe): Secondary | ICD-10-CM

## 2014-06-18 DIAGNOSIS — I429 Cardiomyopathy, unspecified: Secondary | ICD-10-CM | POA: Insufficient documentation

## 2014-06-18 DIAGNOSIS — M109 Gout, unspecified: Secondary | ICD-10-CM | POA: Insufficient documentation

## 2014-06-18 DIAGNOSIS — E1149 Type 2 diabetes mellitus with other diabetic neurological complication: Secondary | ICD-10-CM | POA: Insufficient documentation

## 2014-06-18 DIAGNOSIS — D649 Anemia, unspecified: Secondary | ICD-10-CM | POA: Insufficient documentation

## 2014-06-18 DIAGNOSIS — R270 Ataxia, unspecified: Secondary | ICD-10-CM | POA: Insufficient documentation

## 2014-06-18 DIAGNOSIS — E1122 Type 2 diabetes mellitus with diabetic chronic kidney disease: Secondary | ICD-10-CM | POA: Insufficient documentation

## 2014-06-18 DIAGNOSIS — N183 Chronic kidney disease, stage 3 unspecified: Secondary | ICD-10-CM | POA: Insufficient documentation

## 2014-06-18 DIAGNOSIS — I10 Essential (primary) hypertension: Secondary | ICD-10-CM | POA: Insufficient documentation

## 2014-06-18 DIAGNOSIS — I350 Nonrheumatic aortic (valve) stenosis: Secondary | ICD-10-CM | POA: Insufficient documentation

## 2014-06-18 DIAGNOSIS — I2510 Atherosclerotic heart disease of native coronary artery without angina pectoris: Secondary | ICD-10-CM | POA: Insufficient documentation

## 2014-06-18 DIAGNOSIS — G500 Trigeminal neuralgia: Secondary | ICD-10-CM | POA: Insufficient documentation

## 2014-06-20 LAB — BASIC METABOLIC PANEL
Anion Gap: 7 (ref 7–16)
BUN: 40 mg/dL — AB (ref 7–18)
CREATININE: 1.73 mg/dL — AB (ref 0.60–1.30)
Calcium, Total: 8.8 mg/dL (ref 8.5–10.1)
Chloride: 104 mmol/L (ref 98–107)
Co2: 27 mmol/L (ref 21–32)
EGFR (African American): 33 — ABNORMAL LOW
GFR CALC NON AF AMER: 28 — AB
Glucose: 186 mg/dL — ABNORMAL HIGH (ref 65–99)
Osmolality: 290 (ref 275–301)
Potassium: 4.6 mmol/L (ref 3.5–5.1)
Sodium: 138 mmol/L (ref 136–145)

## 2014-06-20 LAB — HEPATIC FUNCTION PANEL A (ARMC)
Albumin: 3.1 g/dL — ABNORMAL LOW (ref 3.4–5.0)
Alkaline Phosphatase: 125 U/L — ABNORMAL HIGH
Bilirubin,Total: 0.3 mg/dL (ref 0.2–1.0)
SGOT(AST): 32 U/L (ref 15–37)
SGPT (ALT): 31 U/L (ref 12–78)
Total Protein: 6.6 g/dL (ref 6.4–8.2)

## 2014-06-20 LAB — URINALYSIS, COMPLETE
Bilirubin,UR: NEGATIVE
Glucose,UR: 50 mg/dL (ref 0–75)
Ketone: NEGATIVE
Nitrite: NEGATIVE
Ph: 5 (ref 4.5–8.0)
Protein: 100
Specific Gravity: 1.015 (ref 1.003–1.030)
WBC UR: 373 /HPF (ref 0–5)

## 2014-06-20 LAB — RETICULOCYTES
Absolute Retic Count: 0.1445 10*6/uL (ref 0.019–0.186)
Reticulocyte: 5.4 % — ABNORMAL HIGH (ref 0.4–3.1)

## 2014-06-20 LAB — LIPID PANEL
Cholesterol: 144 mg/dL (ref 0–200)
HDL: 59 mg/dL (ref 40–60)
Ldl Cholesterol, Calc: 52 mg/dL (ref 0–100)
Triglycerides: 167 mg/dL (ref 0–200)
VLDL CHOLESTEROL, CALC: 33 mg/dL (ref 5–40)

## 2014-06-20 LAB — FERRITIN: Ferritin (ARMC): 71 ng/mL (ref 8–388)

## 2014-06-20 LAB — CANCER CENTER HEMOGLOBIN: HGB: 8.8 g/dL — ABNORMAL LOW (ref 12.0–16.0)

## 2014-06-20 LAB — HEMOGLOBIN A1C: HEMOGLOBIN A1C: 6.2 % (ref 4.2–6.3)

## 2014-06-22 ENCOUNTER — Ambulatory Visit: Payer: Self-pay | Admitting: Internal Medicine

## 2014-06-23 LAB — CANCER CENTER HEMOGLOBIN: HGB: 10.3 g/dL — AB (ref 12.0–16.0)

## 2014-06-24 LAB — URINE CULTURE

## 2014-06-30 LAB — FERRITIN: FERRITIN (ARMC): 215 ng/mL (ref 8–388)

## 2014-06-30 LAB — CANCER CENTER HEMOGLOBIN: HGB: 9.1 g/dL — ABNORMAL LOW (ref 12.0–16.0)

## 2014-07-04 LAB — CANCER CENTER HEMOGLOBIN: HGB: 9.7 g/dL — ABNORMAL LOW (ref 12.0–16.0)

## 2014-07-06 ENCOUNTER — Ambulatory Visit: Payer: Self-pay | Admitting: Unknown Physician Specialty

## 2014-07-08 ENCOUNTER — Ambulatory Visit: Payer: Self-pay | Admitting: Unknown Physician Specialty

## 2014-07-08 LAB — FERRITIN: FERRITIN (ARMC): 201 ng/mL (ref 8–388)

## 2014-07-08 LAB — CANCER CENTER HEMOGLOBIN: HGB: 10.8 g/dL — AB (ref 12.0–16.0)

## 2014-07-12 LAB — CANCER CENTER HEMOGLOBIN: HGB: 11.2 g/dL — AB (ref 12.0–16.0)

## 2014-07-20 LAB — CANCER CENTER HEMOGLOBIN: HGB: 10.2 g/dL — ABNORMAL LOW (ref 12.0–16.0)

## 2014-07-20 LAB — FERRITIN: Ferritin (ARMC): 98 ng/mL (ref 8–388)

## 2014-07-23 ENCOUNTER — Ambulatory Visit: Payer: Self-pay | Admitting: Internal Medicine

## 2014-07-27 LAB — LACTATE DEHYDROGENASE: LDH: 165 U/L (ref 81–246)

## 2014-07-27 LAB — CANCER CENTER HEMOGLOBIN
HGB: 8.6 g/dL — ABNORMAL LOW (ref 12.0–16.0)
HGB: 8.2 g/dL — ABNORMAL LOW (ref 12.0–16.0)

## 2014-07-27 LAB — RETICULOCYTES
ABSOLUTE RETIC COUNT: 0.1345 10*6/uL (ref 0.019–0.186)
Reticulocyte: 5.55 % — ABNORMAL HIGH (ref 0.4–3.1)

## 2014-07-29 LAB — CANCER CENTER HEMOGLOBIN: HGB: 10.2 g/dL — ABNORMAL LOW (ref 12.0–16.0)

## 2014-08-01 LAB — CANCER CENTER HEMOGLOBIN: HGB: 10.6 g/dL — ABNORMAL LOW (ref 12.0–16.0)

## 2014-08-01 LAB — FERRITIN: Ferritin (ARMC): 235 ng/mL (ref 8–388)

## 2014-08-15 LAB — CANCER CENTER HEMOGLOBIN
HGB: 8.1 g/dL — AB (ref 12.0–16.0)
HGB: 9.5 g/dL — ABNORMAL LOW (ref 12.0–16.0)

## 2014-08-15 LAB — FERRITIN: FERRITIN (ARMC): 82 ng/mL (ref 8–388)

## 2014-08-22 LAB — CANCER CENTER HEMOGLOBIN: HGB: 9.3 g/dL — AB (ref 12.0–16.0)

## 2014-08-23 ENCOUNTER — Ambulatory Visit: Payer: Self-pay | Admitting: Internal Medicine

## 2014-08-31 LAB — RETICULOCYTES
Absolute Retic Count: 0.1143 10*6/uL (ref 0.019–0.186)
Reticulocyte: 3.86 % — ABNORMAL HIGH (ref 0.4–3.1)

## 2014-08-31 LAB — FERRITIN: Ferritin (ARMC): 166 ng/mL (ref 8–388)

## 2014-08-31 LAB — CANCER CENTER HEMOGLOBIN: HGB: 10 g/dL — AB (ref 12.0–16.0)

## 2014-09-07 LAB — RETICULOCYTES
Absolute Retic Count: 0.221 10*6/uL — ABNORMAL HIGH (ref 0.019–0.186)
RETICULOCYTE: 8.19 % — AB (ref 0.4–3.1)

## 2014-09-07 LAB — CANCER CENTER HEMOGLOBIN: HGB: 9.2 g/dL — ABNORMAL LOW (ref 12.0–16.0)

## 2014-09-07 LAB — FERRITIN: Ferritin (ARMC): 88 ng/mL (ref 8–388)

## 2014-09-12 LAB — CANCER CENTER HEMOGLOBIN: HGB: 10.7 g/dL — AB (ref 12.0–16.0)

## 2014-09-19 LAB — CANCER CENTER HEMOGLOBIN: HGB: 9.8 g/dL — AB (ref 12.0–16.0)

## 2014-09-19 LAB — FERRITIN: Ferritin (ARMC): 185 ng/mL (ref 8–388)

## 2014-09-22 ENCOUNTER — Ambulatory Visit: Payer: Self-pay | Admitting: Internal Medicine

## 2014-09-23 LAB — CANCER CENTER HEMOGLOBIN: HGB: 8.6 g/dL — ABNORMAL LOW (ref 12.0–16.0)

## 2014-09-26 LAB — FERRITIN: FERRITIN (ARMC): 130 ng/mL (ref 8–388)

## 2014-09-26 LAB — CANCER CENTER HEMOGLOBIN: HGB: 10.1 g/dL — AB (ref 12.0–16.0)

## 2014-10-03 LAB — CANCER CENTER HEMOGLOBIN: HGB: 9.4 g/dL — AB (ref 12.0–16.0)

## 2014-10-12 LAB — FERRITIN: Ferritin (ARMC): 54 ng/mL (ref 8–388)

## 2014-10-12 LAB — CANCER CENTER HEMOGLOBIN: HGB: 9.6 g/dL — ABNORMAL LOW (ref 12.0–16.0)

## 2014-10-20 LAB — CANCER CENTER HEMATOCRIT: HCT: 28.2 % — AB (ref 35.0–47.0)

## 2014-10-20 LAB — CANCER CENTER HEMOGLOBIN: HGB: 9.5 g/dL — AB (ref 12.0–16.0)

## 2014-10-20 LAB — FERRITIN: FERRITIN (ARMC): 320 ng/mL (ref 8–388)

## 2014-10-23 ENCOUNTER — Ambulatory Visit: Payer: Self-pay | Admitting: Internal Medicine

## 2014-10-25 DIAGNOSIS — E119 Type 2 diabetes mellitus without complications: Secondary | ICD-10-CM | POA: Insufficient documentation

## 2014-10-26 ENCOUNTER — Ambulatory Visit: Payer: Self-pay | Admitting: Ophthalmology

## 2014-10-26 DIAGNOSIS — I10 Essential (primary) hypertension: Secondary | ICD-10-CM

## 2014-10-26 LAB — HEMOGLOBIN: HGB: 10.6 g/dL — AB (ref 12.0–16.0)

## 2014-10-26 LAB — POTASSIUM: POTASSIUM: 4.3 mmol/L (ref 3.5–5.1)

## 2014-10-31 ENCOUNTER — Ambulatory Visit: Payer: Self-pay | Admitting: Ophthalmology

## 2014-11-04 ENCOUNTER — Ambulatory Visit: Payer: Self-pay | Admitting: Internal Medicine

## 2014-11-04 LAB — CANCER CENTER HEMOGLOBIN: HGB: 10.8 g/dL — ABNORMAL LOW (ref 12.0–16.0)

## 2014-11-04 LAB — FERRITIN: FERRITIN (ARMC): 137 ng/mL (ref 8–388)

## 2014-11-10 LAB — FERRITIN: Ferritin (ARMC): 127 ng/mL (ref 8–388)

## 2014-11-10 LAB — CANCER CENTER HEMOGLOBIN: HGB: 11.1 g/dL — AB (ref 12.0–16.0)

## 2014-11-21 LAB — FERRITIN: FERRITIN (ARMC): 70 ng/mL (ref 8–388)

## 2014-11-21 LAB — CANCER CENTER HEMOGLOBIN: HGB: 10.8 g/dL — ABNORMAL LOW (ref 12.0–16.0)

## 2014-11-22 ENCOUNTER — Ambulatory Visit: Payer: Self-pay | Admitting: Internal Medicine

## 2014-11-28 LAB — CANCER CENTER HEMOGLOBIN: HGB: 10.9 g/dL — ABNORMAL LOW (ref 12.0–16.0)

## 2014-12-08 LAB — FERRITIN: Ferritin (ARMC): 174 ng/mL (ref 8–388)

## 2014-12-08 LAB — CANCER CENTER HEMOGLOBIN: HGB: 11.2 g/dL — AB (ref 12.0–16.0)

## 2014-12-22 LAB — CANCER CENTER HEMOGLOBIN: HGB: 10.7 g/dL — ABNORMAL LOW (ref 12.0–16.0)

## 2014-12-22 LAB — FERRITIN: FERRITIN (ARMC): 106 ng/mL (ref 8–388)

## 2014-12-23 ENCOUNTER — Ambulatory Visit: Payer: Self-pay | Admitting: Internal Medicine

## 2014-12-27 DIAGNOSIS — R609 Edema, unspecified: Secondary | ICD-10-CM | POA: Insufficient documentation

## 2015-01-04 LAB — FERRITIN: Ferritin (ARMC): 60 ng/mL (ref 8–388)

## 2015-01-04 LAB — CANCER CENTER HEMOGLOBIN: HGB: 9.1 g/dL — ABNORMAL LOW (ref 12.0–16.0)

## 2015-01-05 LAB — CANCER CENTER HEMOGLOBIN: HGB: 9.4 g/dL — ABNORMAL LOW (ref 12.0–16.0)

## 2015-01-06 LAB — CANCER CENTER HEMOGLOBIN: HGB: 9.6 g/dL — ABNORMAL LOW (ref 12.0–16.0)

## 2015-01-10 LAB — CANCER CENTER HEMOGLOBIN: HGB: 10.9 g/dL — ABNORMAL LOW (ref 12.0–16.0)

## 2015-01-17 ENCOUNTER — Inpatient Hospital Stay: Payer: Self-pay | Admitting: Internal Medicine

## 2015-01-17 LAB — HEMOGLOBIN
HGB: 9 g/dL — AB (ref 12.0–16.0)
HGB: 9.5 g/dL — ABNORMAL LOW (ref 12.0–16.0)

## 2015-01-17 LAB — CANCER CENTER HEMOGLOBIN: HGB: 9 g/dL — ABNORMAL LOW (ref 12.0–16.0)

## 2015-01-18 LAB — CBC WITH DIFFERENTIAL/PLATELET
BASOS ABS: 0.1 10*3/uL (ref 0.0–0.1)
Basophil %: 1 %
Eosinophil #: 0.1 10*3/uL (ref 0.0–0.7)
Eosinophil %: 1.1 %
HCT: 29.9 % — AB (ref 35.0–47.0)
HGB: 10.1 g/dL — ABNORMAL LOW (ref 12.0–16.0)
LYMPHS ABS: 0.6 10*3/uL — AB (ref 1.0–3.6)
Lymphocyte %: 8 %
MCH: 31.9 pg (ref 26.0–34.0)
MCHC: 33.9 g/dL (ref 32.0–36.0)
MCV: 94 fL (ref 80–100)
Monocyte #: 0.6 x10 3/mm (ref 0.2–0.9)
Monocyte %: 8.9 %
NEUTROS ABS: 5.7 10*3/uL (ref 1.4–6.5)
Neutrophil %: 81 %
Platelet: 135 10*3/uL — ABNORMAL LOW (ref 150–440)
RBC: 3.17 10*6/uL — ABNORMAL LOW (ref 3.80–5.20)
RDW: 16.4 % — AB (ref 11.5–14.5)
WBC: 7 10*3/uL (ref 3.6–11.0)

## 2015-01-18 LAB — BASIC METABOLIC PANEL
ANION GAP: 9 (ref 7–16)
BUN: 29 mg/dL — AB (ref 7–18)
Calcium, Total: 8.7 mg/dL (ref 8.5–10.1)
Chloride: 105 mmol/L (ref 98–107)
Co2: 25 mmol/L (ref 21–32)
Creatinine: 1.8 mg/dL — ABNORMAL HIGH (ref 0.60–1.30)
EGFR (African American): 35 — ABNORMAL LOW
EGFR (Non-African Amer.): 29 — ABNORMAL LOW
GLUCOSE: 179 mg/dL — AB (ref 65–99)
Osmolality: 288 (ref 275–301)
Potassium: 3.9 mmol/L (ref 3.5–5.1)
Sodium: 139 mmol/L (ref 136–145)

## 2015-01-18 LAB — HEMOGLOBIN
HGB: 9 g/dL — ABNORMAL LOW (ref 12.0–16.0)
HGB: 8.7 g/dL — AB (ref 12.0–16.0)

## 2015-01-18 LAB — PROTIME-INR
INR: 1.1
Prothrombin Time: 13.6 secs (ref 11.5–14.7)

## 2015-01-19 LAB — HEMOGLOBIN: HGB: 9.5 g/dL — ABNORMAL LOW (ref 12.0–16.0)

## 2015-01-23 ENCOUNTER — Ambulatory Visit: Payer: Self-pay | Admitting: Internal Medicine

## 2015-01-23 LAB — CANCER CENTER HEMOGLOBIN: HGB: 12.1 g/dL (ref 12.0–16.0)

## 2015-02-21 ENCOUNTER — Ambulatory Visit
Admit: 2015-02-21 | Disposition: A | Discharge status: Home / Self Care | Payer: Self-pay | Attending: Internal Medicine | Admitting: Internal Medicine

## 2015-03-02 DIAGNOSIS — A498 Other bacterial infections of unspecified site: Secondary | ICD-10-CM

## 2015-03-02 HISTORY — DX: Other bacterial infections of unspecified site: A49.8

## 2015-03-06 DIAGNOSIS — E7800 Pure hypercholesterolemia, unspecified: Secondary | ICD-10-CM | POA: Insufficient documentation

## 2015-03-17 LAB — CANCER CENTER HEMOGLOBIN: HGB: 11.5 g/dL — ABNORMAL LOW (ref 12.0–16.0)

## 2015-03-22 LAB — CANCER CENTER HEMOGLOBIN: HGB: 11.2 g/dL — AB (ref 12.0–16.0)

## 2015-03-24 ENCOUNTER — Ambulatory Visit
Admit: 2015-03-24 | Disposition: A | Discharge status: Home / Self Care | Payer: Self-pay | Attending: Internal Medicine | Admitting: Internal Medicine

## 2015-04-03 LAB — FERRITIN: Ferritin (ARMC): 67 ng/mL

## 2015-04-03 LAB — CANCER CENTER HEMOGLOBIN: HGB: 10.5 g/dL — ABNORMAL LOW (ref 12.0–16.0)

## 2015-04-10 LAB — CANCER CENTER HEMOGLOBIN: HGB: 11 g/dL — AB (ref 12.0–16.0)

## 2015-04-11 NOTE — Op Note (Signed)
PATIENT NAME:  [name], [name] MR#:  [id number] DATE OF BIRTH:  Aug 14, 1935  DATE OF PROCEDURE:  07/10/2012  PREOPERATIVE DIAGNOSIS: Anemia.   POSTOPERATIVE DIAGNOSIS: Anemia.   PROCEDURE PERFORMED: Insertion of central venous catheter with subcutaneous infusion port.   SURGEON: Rochel Brome, M.D.   ANESTHESIA: Local 1% Xylocaine with monitored anesthesia care.   INDICATIONS: This 80 year old female has a history of slow blood loss from her gastrointestinal tract and iron deficiency anemia needing frequent blood transfusions and needing intravenous iron infusion and insertion of a catheter was recommended for intravenous access.  DESCRIPTION OF PROCEDURE: The patient was placed on the operating table in the supine position. A rolled sheet was placed behind the shoulder blades. Her head was placed on a doughnut ring so that the neck was extended. The head was turned 20 degrees to the left. The right side of her neck and the subclavian areas were prepared with ChloraPrep and draped in a sterile manner.   The skin beneath the clavicle was infiltrated with 1% Xylocaine. A transversely oriented 3 cm incision was made and carried down through subcutaneous tissues. A subcutaneous pouch was created just anterior to the deep fascia large enough to admit the PFM port. Next, the jugular vein was identified with ultrasound, also identified the carotid artery, both of which appeared normal. The skin overlying the jugular vein was infiltrated with 1% Xylocaine. A transversely oriented 6 mm incision was made. Next, with the patient in the Trendelenburg position and using ultrasound guidance, a needle was inserted into the jugular vein, a guidewire was inserted and the needle removed. An ultrasound image was saved for the paper chart. Next, fluoroscopy was used to see the location of the wire in the vena cava. The dilator and introducer sheath were advanced over the wire. The wire and dilator were removed. The  catheter was advanced down through the sheath and the sheath was peeled away. Fluoroscopy was used to position the tip in the vena cava and a fluoroscopic image was saved for the paper chart. Next, the catheter was tunneled down to the subclavian incision and cut to fit and was attached to the PFM port using the accompanying sleeve. The port was accessed with a Huber needle, aspirated a trace of blood, and then flushed with 10 mL of saline solution. The port was placed into the subcutaneous pouch and was sutured to the deep fascia with 4-0 silk. It is noted during the course of the procedure a number of small bleeding points were cauterized. Hemostasis was subsequently intact. The pouch was closed with 5-0 Vicryl and then both skin incisions were closed with 5-0 Vicryl subcuticular suture and Dermabond. The patient tolerated surgery satisfactorily and was then prepared for transfer to the recovery room. ____________________________ Lenna Sciara. Rochel Brome, MD jws:slb D: 07/10/2012 10:07:12 ET T: 07/10/2012 11:40:28 ET JOB#: [id number]  cc: Loreli Dollar, MD, <Dictator> Loreli Dollar MD ELECTRONICALLY SIGNED 07/11/2012 13:59

## 2015-04-14 NOTE — Discharge Summary (Signed)
PATIENT NAME:  Morgan [name], Morgan [name] MR#:  [id number] DATE OF BIRTH:  26-Apr-1935  DATE OF ADMISSION:  01/22/2013  DATE OF DISCHARGE:  01/26/2013  FINAL DIAGNOSES: 1.  Gastrointestinal bleed, recurrent and acute on chronic from arteriovenous malformations.  2.  Mild chronic thrombocytopenia.  3.  Diabetes.  4.  Stable hypertension.  5.  Chronic obstructive pulmonary disease and shortness of breath.  6.  Mild electrolyte disorder, hypokalemia and hypomagnesemia.   HISTORY AND PHYSICAL:  As dictated on admission.   CONSULTATIONS:  With pulmonary medicine, Dr. Raul Del; and with cardiology, Dr. Nehemiah Massed; and GI, Dr. Gustavo Lah.   PROCEDURES: EGD was performed by Dr. Gustavo Lah, and patient also received blood transfusion.   HOSPITAL COURSE:  As in the admission history, the patient was admitted. A unit of blood had been given in the clinic with Lasix, and a second unit of blood was given the evening of admission. Oral iron was held initially. Zocor was held. Nexium was held. The patient was given IV Protonix and Carafate was added. After fluids, these medication changes, her shortness of breath and bilateral extremity edema was noted. Cardiology and pulmonary medicine also evaluated the patient, respiratory medications were added in the form of inhalers. On the day of discharge, the patient was stable, lower extremity edema had improved, the patient subjectively reported that she felt better than she has in some time. She had not had a bowel movement in 2 days. There was still bilateral 1+ lower extremity edema. There was no rhonchi. There was decreased air entry.  The heart was regular. Abdomen was benign. On labs, her phosphorus was 3.2, magnesium was 1.4, creatinine was 1.23. The blood pressure was under good control. Was tolerating p.o.   DISCHARGE MEDICATIONS:  She discharged home on the following medications, see also discharge instructions. Diltiazem 240 mg extended-release once a day at lunch time,  lisinopril 40 mg once a day at lunchtime, torsemide 20 mg once a day, Colace 100 mg twice a day, Actos 45 mg once a day at lunch, Carafate, initially prescribed liquid, but the patient wanted tablets, then agreed to take the liquid 1 gram in 10 mL orally 4 times a day. Budesonide 180 mcg inhaler 1 puff every 12 hours, Protonix 40 mg 1 twice a day.  Tandem, Nexium and Zocor were held. Aspirin was held with instructions to hold aspirin so she has a bowel movement that is brown, not black, and then she is to resume her aspirin.   FOLLOWUP PLANS:  Will be seen in the cancer center with labs and hemoglobin and electrolytes. She was also to call Dr. Raul Del for a followup appointment within 2 weeks.     ____________________________ Simonne Come Inez Pilgrim, MD rgg:dm D: 02/10/2013 10:48:13 ET T: 02/10/2013 11:23:00 ET JOB#: [id number]  cc: Simonne Come. Inez Pilgrim, MD, <Dictator> Dallas Schimke MD ELECTRONICALLY SIGNED 02/12/2013 14:19

## 2015-04-14 NOTE — H&P (Signed)
PATIENT NAME:  Morgan [name], Morgan [name] MR#:  [id number] DATE OF BIRTH:  01-Aug-1935  DATE OF ADMISSION:  01/22/2013  HISTORY OF PRESENT ILLNESS:  The patient is a 80 year old patient well known to me and was admitted today after evaluation in the clinic and later re-evaluated up in the hospital floor.  The patient has a past history of angiodysplasias of the colon and has had chronic intermittent bleeding requiring iron and transfusion support.  In the past she had had a therapeutic procedure, but later with additional angiodysplasias being noted, supportive care was recommended.  Recently however her requirement for iron and then recently for blood had increased and earlier this week had melena stool, received blood in the clinic two days ago and was referred back to GI.  Yesterday and then today patient had melena stool and hemoglobin was  at 8.1 and 8.6, and after transfusion was 9.3.  It is recognized that patient has an increased blood requirement, even hemoglobin 9.3 was weak with orthostasis, slight dizziness, shortness of breath on minimal exertion, although she did not have any palpitations or retrosternal pain or focal weakness.  The patient is hospitalized for fluid support as needed.  Additional blood, at least one more unit tonight, GI consultation and possible therapeutic intervention.   PAST MEDICAL HISTORY:  Also includes diabetes, reflux and peptic ulcer disease, cervix cancer in the past, irritable bowel, hyperlipidemia and fatty liver plus coronary artery disease and stenting years ago.   ALLERGIES:  TO DARVON, REACTION UNKNOWN.   RECENT MEDICATIONS:  Aspirin 81 mg daily, although did not take an aspirin today, oral tandem tablets one a day, diltiazem CD 240 mg daily, lisinopril 40 mg daily, Nexium 40 mg daily, torsemide 20 mg daily, Colace 100 mg twice daily, Zocor 20 mg daily, Actos 45 mg daily and had taken lactulose as needed for constipation.   FAMILY HISTORY:  Noncontributory.  One member  had lung cancer.   SOCIAL HISTORY:  No alcohol, was a smoker about 1/2 pack of cigarettes a day for over 50 years.   ADDITIONAL REVIEW OF SYSTEMS:   Patient did not have headache or visual disturbances.  No cough, wheezing, no hemoptysis.  No palpitations.  No retrosternal chest pain.  No abdominal pain.  No vomiting.  No dysuria or hematuria.  She has chronic lower extremity symmetric edema that is stable.  No rash.  No bruising.   PHYSICAL EXAMINATION: GENERAL:  Alert and cooperative, had pallor.  No jaundice.  HEENT:  Sclerae clear.  MOUTH:  No thrush.  LYMPHATIC:  No palpable lymph nodes in the neck, supraclavicular, submandibular or axillae.  LUNGS:  Clear.  No wheezing or rales.  NEUROLOGIC:  Grossly nonfocal.  Cranial nerves intact.  Some general weakness.  HEART:  Regular.  ABDOMEN:  Nontender with no palpable mass, organomegaly.  EXTREMITIES:  Have bilateral 2+ to 3 symmetric edema.  No rash.     LABORATORY DATA:  The hemoglobin was 8.6 earlier and after a unit of blood was 9.3.  The creatinine was 1.46.  At the time of admission full CBC was pending and then showing platelets 133,000, which is consistent with a prior chronic mild thrombocytopenia.  Pro Time is pending.   IMPRESSION AND PLAN:  The patient with previous iron deficiency ferriheme has > replaced and chronic gastrointestinal bleed with arteriovenous malformations previously known in the colon.  Also colon polyps.  Mild chronic thrombocytopenia.  Prior work-ups including a negative HIV and hepatitis C.  There  has been fatty liver in the past.  Currently with recently increased bleeding and symptomatic as in current illness.  Also underlying diabetes, on oral agents.   PLAN:  The patient is admitted.  IV port is accessed.  A unit of blood was given at clinic.  60 mg by mouth Lasix was given and then good urine output after delay.  A second unit of blood is given tonight.  The patient will continue on diltiazem, lisinopril,  torsemide, Colace, Actos.  Can hold tandem, can hold Zocor.  We will hold Nexium and as discussed with GI would instead give Protonix IV.  Add Carafate.  Consult GI.  I have discussed with Dr. Gustavo Lah.  Clear liquid diet.  Telemetry.  Vital signs every 4 hours.     ____________________________ Simonne Come Inez Pilgrim, MD rgg:ea D: 01/22/2013 21:21:33 ET T: 01/22/2013 23:32:21 ET JOB#: [id number]  cc: Simonne Come. Inez Pilgrim, MD, <Dictator> Dallas Schimke MD ELECTRONICALLY SIGNED 02/12/2013 14:22

## 2015-04-14 NOTE — Consult Note (Signed)
General Aspect 80 year old female admitted with acute on chronic GI bleed with history of AV malformations with history of CAD PAD that has not been seen in cardiology since 2005 at which time she had a cardiac catheterization showing restenosis of the mid RCA and PDA with mild decrease in LV function receiving 2 new Cypher stents in the area of restenosis.  Patient's  has been off Plavix and aspirin for  some time when she started having intermittent GI bleeds.  When she is more anemic she does complain of feeling more weak having shortness of breath and feeling heavy in her shoulders.  Now in the hospital with hemoglobin at 9.7 she feels much improved and does not have any worrisome cardiac symptoms.  The patient is deconditioned to the point that she usually does not walk too far without experiencing  shortness of breath.  She is mildly short of breath at baseline but becomes very short of breath when she is anemic.  GI has seen patient  and is planning to do an endoscopy and is  requesting cardiac clearance.  Patient did have a chemical stress test in March of 2012, which showed normal perfusion.  But right after that patient had significant GI distress with bleed and blames the stress test and states she will never do another chemical stress test.  She does have a systolic murmur with her last echo in 2011 showing mild elevated pulmonary pressures, LVH and normal LV function with mild aortic stenosis.She does have intermittent lower extremity edema, which is worse when she is more anemic.  Her past medical history includes diabetes mellitus hypertension and GERD IBS hyperlipidemia PVD status post right endarterectomy in 2001 and previous tobacco abuse COPD.  Telemetry strips have shown normal sinus rhythm.   Physical Exam:   GEN well developed, no acute distress    HEENT pink conjunctivae    RESP normal resp effort  clear BS    CARD Regular rate and rhythm  Normal, S1, S2  Murmur    Murmur  Systolic    Systolic Murmur Out flow    ABD denies tenderness  normal BS    EXTR positive edema, mild edema    SKIN skin turgor decreased    NEURO cranial nerves intact, motor/sensory function intact    PSYCH alert, A+O to time, place, person   Review of Systems:   Subjective/Chief Complaint Anemia, weakness    General: Fatigue  Weakness    Skin: Color changes  lower extremities    ENT: No Complaints    Eyes: No Complaints    Neck: No Complaints    Respiratory: No Complaints    Cardiovascular: shoulder heaviness when most anemic    Gastrointestinal: Black tarry stools    Genitourinary: No Complaints    Vascular: No Complaints    Musculoskeletal: No Complaints    Neurologic: No Complaints    Hematologic: history of anemia    Endocrine: No Complaints    Psychiatric: No Complaints    Review of Systems: All other systems were reviewed and found to be negative    Medications/Allergies Reviewed Medications/Allergies reviewed    Darvon: Other  Other -Explain in Comment Field: Unknown  Vital Signs/Nurse's Notes: **Vital Signs.:   03-Feb-14 13:11   Temperature Temperature (F) 97.8   Celsius 36.5   Temperature Source oral   Pulse Pulse 67   Respirations Respirations 18   Systolic BP Systolic BP 878   Diastolic BP (mmHg) Diastolic BP (mmHg) 64  Mean BP 94   Pulse Ox % Pulse Ox % 95   Pulse Ox Activity Level  At rest   Oxygen Delivery Room Air/ 21 %     Impression 80 year old female with history of anemia and GI bleed with AV malformations also with history of CAD,  mild aortic stenosis, diabetes mellitus PAD, s/p endarterectomy, prior tobacco abuse ,  COPD, admitted with acute on chronic GI bleed much improved after transfusion.  Patient is pending evaluation with endoscopy and needs cardiac clearance.  She does have much shortness of breath, fatigue and some shoulder heaviness when she is most anemic probably secondary to anemia.  However, she denies any  recent exertional chest pain, recent episodes of congestive heart failure,  tachyarrhythmias and appears stable from a cardiac standpoint. She has exertional dyspnea at baseline which is usually worsened with acute anemia.  Currently she appears stable and is thought to be at low risk to proceed to endoscopy and or discharge home depending on hematology/ GI plans.  Patient will be followed up in cardiology at a later date for possible ETT and/or  stress test, but are not necessary at this point for patient to proceed with endoscopy and/ or discharge home.   Patient was discussed with Dr. Nehemiah Massed who also interviewed/ examined patient and devised plan of care.   Electronic Signatures: Roderic Palau (NP)  (Signed 03-Feb-14 14:38)  Authored: General Aspect/Present Illness, History and Physical Exam, Review of System, Allergies, Vital Signs/Nurse's Notes, Impression/Plan   Last Updated: 03-Feb-14 14:38 by Roderic Palau (NP)

## 2015-04-14 NOTE — Consult Note (Signed)
Chief Complaint:   Subjective/Chief Complaint seen for anemia and melena.  Patient with a watery black bm earlier today.  denies nausea and abdominal pain.  becomes very short of breath with little effort.   VITAL SIGNS/ANCILLARY NOTES: **Vital Signs.:   02-Feb-14 13:20   Vital Signs Type Routine   Temperature Temperature (F) 97.8   Celsius 36.5   Temperature Source Oral   Pulse Pulse 70   Respirations Respirations 20   Systolic BP Systolic BP 354   Diastolic BP (mmHg) Diastolic BP (mmHg) 52   Mean BP 81   Pulse Ox % Pulse Ox % 93   Pulse Ox Activity Level  At rest   Oxygen Delivery Room Air/ 21 %  *Intake and Output.:   02-Feb-14 12:00   Stool  x1 large dark loose   Brief Assessment:   Cardiac Regular    Respiratory wheezing  rhonchi    Gastrointestinal details normal Soft  Nontender  Nondistended  Bowel sounds normal  No rebound tenderness   Lab Results: Routine Chem:  02-Feb-14 04:01    Result Comment LABS - This specimen was collected through an   - indwelling catheter or arterial line.  - A minimum of 56ms of blood was wasted prior    - to collecting the sample.  Interpret  - results with caution.  Result(s) reported on 24 Jan 2013 at 04:24AM.  Routine Coag:  31-Jan-14 18:19    INR 0.9 (INR reference interval applies to patients on anticoagulant therapy. A single INR therapeutic range for coumarins is not optimal for all indications; however, the suggested range for most indications is 2.0 - 3.0. Exceptions to the INR Reference Range may include: Prosthetic heart valves, acute myocardial infarction, prevention of myocardial infarction, and combinations of aspirin and anticoagulant. The need for a higher or lower target INR must be assessed individually. Reference: The Pharmacology and Management of the Vitamin K  antagonists: the seventh ACCP Conference on Antithrombotic and Thrombolytic Therapy. CSFKCL.2751Sept:126 (3suppl): [id number] A HCT value >55% may  artifactually increase the PT.  In one study,  the increase was an average of 25%. Reference:  "Effect on Routine and Special Coagulation Testing Values of Citrate Anticoagulant Adjustment in Patients with High HCT Values." American Journal of Clinical Pathology 2006;126:400-405.)  Routine Hem:  02-Feb-14 04:01    WBC (CBC) 3.7   RBC (CBC)  3.06   Hemoglobin (CBC)  9.6   Hematocrit (CBC)  28.6   Platelet Count (CBC)  128   MCV 94   MCH 31.3   MCHC 33.4   RDW  17.2   Neutrophil % 67.2   Lymphocyte % 16.3   Monocyte % 11.6   Eosinophil % 3.8   Basophil % 1.1   Neutrophil # 2.5   Lymphocyte #  0.6   Monocyte # 0.4   Eosinophil # 0.1   Basophil # 0.0   Assessment/Plan:  Assessment/Plan:   Assessment 1) anemia in the setting of known h/o colonic avms.  patietn presenting with black stool, more c/w upper GI blood loss, such as ulcer or possible gastric involvement.  Patient did take more asa than useal for several days prior to black stools.   2) mutliple medical problems to include CAD/stents, fatty liver, DM,  3) marked dyspnea on exertion, atypical chest pain symptoms.    Plan 1) agree with further evaluation via egd, however I am very concerned in regard to her shortness of breath and atypical chest pain.  lungs  do not sound "wet" but there is periphaeal edema and evidence of LE circulatory issues. Recommend cardiology evaluation, possible pulmonary evaluation prior to sedated luminal evaluation.  It is of note that the hgb has been stable. Discusse at length with patietn who is reticent to see cardiology, even though she has not seen her cardiologist for quite some time per patiene.   Electronic Signatures: Loistine Simas (MD)  (Signed 02-Feb-14 15:39)  Authored: Chief Complaint, VITAL SIGNS/ANCILLARY NOTES, Brief Assessment, Lab Results, Assessment/Plan   Last Updated: 02-Feb-14 15:39 by Loistine Simas (MD)

## 2015-04-14 NOTE — Consult Note (Signed)
Chief Complaint:   Subjective/Chief Complaint Seen for anemia and melena. breathing some better today. no nausea or abdominal pain.   VITAL SIGNS/ANCILLARY NOTES: **Vital Signs.:   04-Feb-14 09:32   Vital Signs Type Q 4hr   Temperature Temperature (F) 97.7   Celsius 36.5   Temperature Source oral   Pulse Pulse 67   Respirations Respirations 20   Systolic BP Systolic BP 299   Diastolic BP (mmHg) Diastolic BP (mmHg) 62   Mean BP 91   Pulse Ox % Pulse Ox % 94   Pulse Ox Activity Level  At rest   Oxygen Delivery Room Air/ 21 %   Brief Assessment:   Cardiac Regular    Respiratory clear BS    Gastrointestinal details normal Soft  Nontender  Nondistended  No masses palpable  Bowel sounds normal   Lab Results: Routine Chem:  04-Feb-14 05:27    Phosphorus, Serum 3.2 (Result(s) reported on 26 Jan 2013 at 05:59AM.)   Magnesium, Serum  1.5 (1.8-2.4 THERAPEUTIC RANGE: 4-7 mg/dL TOXIC: > 10 mg/dL  -----------------------)  Routine Hem:  04-Feb-14 05:27    Hemoglobin (CBC)  9.6 (Result(s) reported on 26 Jan 2013 at [facility].)   Assessment/Plan:  Assessment/Plan:   Assessment 1) melena, anemia-stable.  h.o avms in colon.  h/o transfusion dependant anemia.    Plan 1) egd today.  I have discussed the risks benefits and complicatiosn of egd to include not  limited to bleeding infection perofration and sedation and she wishes to proceed.   Electronic Signatures: Loistine Simas (MD)  (Signed 04-Feb-14 12:47)  Authored: Chief Complaint, VITAL SIGNS/ANCILLARY NOTES, Brief Assessment, Lab Results, Assessment/Plan   Last Updated: 04-Feb-14 12:47 by Loistine Simas (MD)

## 2015-04-14 NOTE — Consult Note (Signed)
Brief Consult Note: Diagnosis: anemia, failure to respond to transfusion.   Patient was seen by consultant.   Recommend further assessment or treatment.   Comments: Please see full GI consult.  Patient admitted to hospital with black stool/melena.  Known h/o colonic avms, treated by cautery in the past.  No other GI sx.  It is of note that pateient states she had episode of bilaterla shoulder pain and pressure last week prior to the development of the black stools, and increased her asa to twice a day.   This is likely contributory to cuttent clinical situation.  Recommend bid ppi and carafate.  EGD when clinically feasible with further evaluation depending on result.  However  with the sx she indiacted needin to increase he asa for, will need cardiac and pulmonary evaluation prior to considering sedated luminal evaluation.  following.  Electronic Signatures: Loistine Simas (MD)  (Signed 01-Feb-14 16:33)  Authored: Brief Consult Note   Last Updated: 01-Feb-14 16:33 by Loistine Simas (MD)

## 2015-04-14 NOTE — Consult Note (Signed)
Chief Complaint:   Subjective/Chief Complaint seen for melena-no bm today, denies abdominal pain or nausea.   VITAL SIGNS/ANCILLARY NOTES: **Vital Signs.:   03-Feb-14 13:11   Temperature Temperature (F) 97.8   Celsius 36.5   Temperature Source oral   Pulse Pulse 67   Respirations Respirations 18   Systolic BP Systolic BP 416   Diastolic BP (mmHg) Diastolic BP (mmHg) 64   Mean BP 94   Pulse Ox % Pulse Ox % 95   Pulse Ox Activity Level  At rest   Oxygen Delivery Room Air/ 21 %   Brief Assessment:   Cardiac Regular    Respiratory clear BS    Gastrointestinal details normal Soft  Nontender  Nondistended  No masses palpable  Bowel sounds normal   Lab Results: Routine BB:  03-Feb-14 04:17    ABO Group + Rh Type B Positive   Antibody Screen NEGATIVE (Result(s) reported on 25 Jan 2013 at [facility].)  Routine Chem:  03-Feb-14 04:17    Result Comment LABS - This specimen was collected through an   - indwelling catheter or arterial line.  - A minimum of 69ms of blood was wasted prior    - to collecting the sample.  Interpret  - results with caution.  Result(s) reported on 25 Jan 2013 at 04:32AM.  Routine Hem:  03-Feb-14 04:17    WBC (CBC) 3.7   RBC (CBC)  3.05   Hemoglobin (CBC)  9.7   Hematocrit (CBC)  28.8   Platelet Count (CBC)  120   MCV 95   MCH 31.9   MCHC 33.8   RDW  16.8   Neutrophil % 64.0   Lymphocyte % 17.2   Monocyte % 13.1   Eosinophil % 4.4   Basophil % 1.3   Neutrophil # 2.4   Lymphocyte #  0.6   Monocyte # 0.5   Eosinophil # 0.2   Basophil # 0.0 (Result(s) reported on 25 Jan 2013 at 04:32AM.)   Assessment/Plan:  Assessment/Plan:   Assessment 1) patietn with h/o avms, chronic anemia with transfusion dependance. stable hgb this week after episode of melena.  Unable to assess with EGD due to marked shortness of breath on exertion and atypical chest pain.  now seen by cardiology, no further cardiology evaluation planned.  also seen this evening by Dr  FRaul Del awaiting further pulmonary input.    Plan 1) awaiting pulmonary input prior to sedated proceedure.  If patient needs more extended period of time to improve pulmonary function, egd can be arranged for a week or 2 in future, however, depending on pulmonary evaluation, could do as early as tomorrow.  will tentatively place on schedule for tomorrow late pm.  I have discussed the risks benefits and complicatiosn of egd to include not limited to bleeding infection perforation and sedation and she wishes to proceed.   Electronic Signatures: SLoistine Simas(MD)  (Signed 03-Feb-14 18:39)  Authored: Chief Complaint, VITAL SIGNS/ANCILLARY NOTES, Brief Assessment, Lab Results, Assessment/Plan   Last Updated: 03-Feb-14 18:39 by SLoistine Simas(MD)

## 2015-04-14 NOTE — Consult Note (Signed)
PATIENT NAME:  Morgan [name], Morgan [name] MR#:  [id number] DATE OF BIRTH:  1935-08-25  DATE OF CONSULTATION:  01/23/2013  REFERRING PHYSICIAN:   CONSULTING PHYSICIAN:  Lollie Sails, MD  REASON FOR CONSULTATION: GI bleed/melena.   HISTORY OF PRESENT ILLNESS: The patient[ is a 80 year old Caucasian female, who has been followed by Dr. Inez Pilgrim for at least a year in regards to iron deficiency anemia in the setting of angiectasias of the gastrointestinal tract. Apparently over the period of the past week or two, there was transfusion given, which did not give appropriate response and the patient related having multiple tarry-type stools. She typically will see dark stools and black stools even on a regular basis; however, has been able to differentiate when she seems to be bleeding further. This past Wednesday she had an iron infusion and was given 1 unit of PRBC, which did not seem to appropriately increase her hemoglobin. She saw Dr. Inez Pilgrim yesterday, related seeing some black stools and was admitted to the hospital for further evaluation. She states she did see some bright red rectal bleeding that occurred once about 3 or 4 weeks ago. She has had extensive GI evaluation in the past, the last time she had an EGD was 02/25/2012 showing some mild gastritis, otherwise normal. She had a colonoscopy on 03/05/2012 showing angiectasias in the ascending colon as well as multiple colon polyps. The polyps were removed and the angiectasias were cauterized. She did have a video capsule endoscopy, which though I do not have a result on this. The patient states that it "didn't show anything." She does have a history of internal hemorrhoids. The patient currently denies any problems with nausea, vomiting, or abdominal pain. There is no heartburn or dysphagia. She does take a single dose of Nexium 40 mg once a day. She has a bowel movement about once a day.   PAST MEDICAL HISTORY: Gastroesophageal reflux, peptic ulcer disease,  remote, diabetes, history of cervical cancer, remote irritable bowel syndrome, hyperlipidemia, fatty liver, coronary artery disease with coronary artery stenting and history of iron deficiency anemia.   FAMILY HISTORY: Her gastrointestinal family history is negative for colorectal cancer, liver disease, or ulcers. She states she has no family members that have similar problems with GI bleed and chronic anemia.   SOCIAL HISTORY: She does not use alcohol. She smokes 1/2 pack of cigarettes daily for 50 years.   OUTPATIENT MEDICATIONS: Actos 45 mg once a day, aspirin 81 mg once a day, Colace 100 mg twice a day, diltiazem CD 240 once a day, lisinopril 40 mg once a day, Nexium 40 mg once a day, Tandem 162 mg/115.2 mg once a day, torsemide 20 mg once a day and Zocor 20 mg once a day.   ALLERGIES: DARVON.   PHYSICAL EXAMINATION:  VITAL SIGNS: Temperature 97.8, pulse 67, respirations 18, blood pressure 151/64 and pulse oximetry 93%.  GENERAL: She is a 80 year old Caucasian female in no acute distress. However, the patient had just returned from going to the bathroom and was quite short of breath. She states that "she is like this all the time."  HEENT: Normocephalic, atraumatic. Eyes: Anicteric. Nose: Septum midline. Oropharynx: No lesions.  NECK: No JVD.  HEART: Regular rate and rhythm.  LUNGS: Clear to occasional coarse wheeze and rhonchi.  ABDOMEN: Soft, nontender, nondistended. Bowel sounds positive, normoactive.  RECTAL: Shows a watery blackish effluent that is brightly Hemoccult positive.  EXTREMITIES: The patient has a peripheral vascular disease/stasis dermatitis distally in her lower extremities. There  is no ulceration. There is a 3+ lower extremity indurated edema. There is no clubbing, cyanosis, or edema.  NEUROLOGICAL: Cranial nerves II through XII grossly intact. Muscle strength bilaterally equal and symmetric. Deep tendon reflexes bilaterally equal and symmetric.   LABORATORIES: Include  the following: Yesterday, she had a creatinine of 1.48, potassium 4.1. Hepatic profile was normal with an albumin slightly low at 3.0. Her hemogram showed a white count of 4.7, hemoglobin and hematocrit 9.1/27.1, platelet count 134, MCV was 95 and RDW is slightly up at 16.2. Her pro time was 12.9, INR 0.9. Repeat laboratories this morning showed a glucose of 117, BUN of 30, creatinine 1.33, sodium 143, potassium 3.9, chloride 107, bicarbonate 28, hemoglobin 9.8. She has had no imaging studies since admission.   ASSESSMENT: Melena/Hemoccult-positive stool in the setting of known iron deficiency anemia secondary to angiectasias. Angiectasias were treated less than a year ago in the colon. Other evaluation has been uninformative. It is of note that the patient states that before she had this episode of black, tarry stools that she had been noticing some increased chest pressure and pressure in her shoulders and stated she doubled her aspirin she normally takes for at least 4 days. Currently, the patient is hemodynamically stable and her hemoglobin is up from her pre-transfusion level. There is evidence of a strong Hemoccult-positive stool. It is of note as above,  the patient had increased her aspirin prior to the development of the blacker stools.   RECOMMENDATION:  1.  Consideration of holding aspirin-type products due to her chronic anemia and chronic GI bleeding/blood loss. She seems to be fairly sensitive to aspirin products.  2,  I would recommend repeat EGD in this clinical setting. However, the patient has a very brittle respiratory status currently and would require consultation with cardiology and/or pulmonology prior to sedated procedure. We will discuss further with Dr. Inez Pilgrim. Continue daily hemoglobins.    ____________________________ Lollie Sails, MD mus:aw D: 01/23/2013 16:28:03 ET T: 01/24/2013 13:18:40 ET JOB#: [id number]  cc: Lollie Sails, MD, <Dictator> Simonne Come. Inez Pilgrim,  MD Lollie Sails MD ELECTRONICALLY SIGNED 01/29/2013 6:15

## 2015-04-15 NOTE — Op Note (Signed)
PATIENT NAME:  Morgan [name], Morgan [name] MR#:  [id number] DATE OF BIRTH:  17-Aug-1935  DATE OF PROCEDURE:  10/31/2014  PREOPERATIVE DIAGNOSIS: Cataract left eye.   POSTOPERATIVE DIAGNOSIS: Cataract left eye.   PROCEDURE PERFORMED: Extracapsular cataract extraction using phacoemulsification with placement of Alcon [id number], 23.5 diopter posterior chamber lens, serial number [id number].    ANESTHESIA: 4% lidocaine and 0.75% Marcaine, 50-50 mixture with 10 units/mL of Hylenex  added given as a peribulbar.   ANESTHESIOLOGIST: Dr. Boston Service.   COMPLICATIONS: None.   ESTIMATED BLOOD LOSS: Less than 1 mL.   DESCRIPTION OF PROCEDURE:  The patient was brought to the operating room and given a peribulbar block.  The patient was then prepped and draped in the usual fashion.  The vertical rectus muscles were imbricated using 5-0 silk sutures.  These sutures were then clamped to the sterile drapes as bridle sutures.  A limbal peritomy was performed extending two clock hours and hemostasis was obtained with cautery.  A partial thickness scleral groove was made at the surgical limbus and dissected anteriorly in a lamellar dissection using an Alcon crescent knife.  The anterior chamber was entered supero-temporally with a Superblade and through the lamellar dissection with a 2.6 mm keratome.  DisCoVisc was used to replace the aqueous and a continuous tear capsulorrhexis was carried out.  Hydrodissection and hydrodelineation were carried out with balanced salt and a 27 gauge canula.  The nucleus was rotated to confirm the effectiveness of the hydrodissection.  Phacoemulsification was carried out using a divide-and-conquer technique.  Total ultrasound time was 1 minutes and 57 seconds with an average power of 24.8 percent.  CDE of 59.61.   Irrigation/aspiration was used to remove the residual cortex.  DisCoVisc was used to inflate the capsule and the internal incision was enlarged to 3 mm with the crescent knife.  The  intraocular lens was folded and inserted into the capsular bag using the AcrySert delivery system.  Irrigation/aspiration was used to remove the residual DisCoVisc.  Miostat was injected into the anterior chamber through the paracentesis track to inflate the anterior chamber and induce miosis.  0.1 mL of cefuroxime containing 1 mg of drug was injected via the paracentesis track.  The wound was checked for leaks and none were found. The conjunctiva was closed with cautery and the bridle sutures were removed.  Two drops of 0.3% Vigamox were placed on the eye.   An eye shield was placed on the eye.  The patient was discharged to the recovery room in good condition.   ____________________________ Loura Back Katheryn Culliton, MD sad:bu D: 10/31/2014 13:14:00 ET T: 10/31/2014 14:08:51 ET JOB#: [id number]  cc: Remo Lipps A. Antonius Hartlage, MD, <Dictator> Martie Lee MD ELECTRONICALLY SIGNED 11/14/2014 13:31

## 2015-04-16 NOTE — H&P (Signed)
PATIENT NAME:  Morgan [name], Morgan [name] MR#:  [id number] DATE OF BIRTH:  04-13-1935  DATE OF ADMISSION:  02/24/2012  HISTORY OF PRESENT ILLNESS: Morgan [name] is a 80 year old patient who takes primary care with Dr. Ouida Sills who is known to me from prior and office evaluation today with iron deficiency anemia. The patient has had recent hospitalizations, which includes 2 units of blood, in mid February, and was documented to have iron deficiency. She was seen back on 02/17/2012 in the clinic, had a unit of blood, started on oral iron, is followed today, but is hospitalized now because of symptomatic anemia and persistent anemia. Hemoglobin was 7.2 last week. After transfusion it was 8.2. Today it is back down to 7.2 after one week. The patient saw no gross bleeding and stools were slightly dark from oral iron, but the patient is now admitting that prior to starting iron her stools had been intermittently dark. The patient also complains of feeling slightly lightheaded and swimmy headed today with clear orthostasis and slight dizziness when sitting. No palpitations, no retrosternal chest pain, not short of breath at rest but short of breath on mild to minimal exertion. No ear, jaw, or back pain. No nausea or vomiting, but she has had loose stools in the last few days since starting iron. The patient has persistent lower extremity edema and says her legs are more swollen since course of prednisone early in February and she has had waxing and waning similar symmetric edema in the past. No fever, chills, or sweats. She is not coughing, not wheezing.  PAST MEDICAL HISTORY:  1. Hypertension. 2. Diabetes. 3. Cervical cancer.  4. Gastroesophageal reflux disease and peptic ulcer disease. 5. Fibrocystic breast disease. 6. Irritable bowel syndrome. 7. Heart murmur.  8. Hyperlipidemia.  9. Prior macrocytosis. 10. Anxiety. 11. Fatty liver. 12. Coronary artery disease and prior stent placement. Cardiac catheterization in 2004  and 2005. Cardiolite stress test in March 2012.  13. Iron deficiency anemia in the past. One time prior to 1999 was iron deficient. Bone marrow in 1999 was unremarkable. In 2008 had iron deficiency. In 2004 had endoscopy and colonoscopy. In March 2012 the patient said she had about three days of gross blood, rectal bleeding, which she did not call to medical attention.   FAMILY HISTORY: A family member had lung cancer. Otherwise noncontributory.   SOCIAL HISTORY: She is a smoker, but no alcohol.   ALLERGIES: Hydrochlorothiazide and Darvon.   MEDICATIONS:  1. Recently placed on ferrous fumarate and polysaccharide iron. 2. Zocor 20 mg in the evening. 3. Nexium 40 mg daily.  4. Lisinopril 40 mg daily.  5. Actos 45 mg daily.  6. Plavix 75 mg daily.  7. Diltiazem CD 240 mg. 8. Torsemide 10 mg daily, but recently on her own she has doubled up and been taking 20 mg daily.  9. Lactulose p.r.n., but has not needed it.  10. Recent Lasix with transfusion.   ADDITIONAL REVIEW OF SYSTEMS: She denies visual disturbances. No retrosternal chest pain. No palpitations. No vomiting. Appetite is adequate. No chills, sweats, or fever. No focal weakness. No back pain. She has dry skin on the legs and static edema. She has easy bruising in the extremities. She does not feel anxious currently.   PHYSICAL EXAMINATION:   VITAL SIGNS: Stable.   GENERAL: She is alert and cooperative. There is pallor.   HEENT: Sclerae no jaundice. Mouth no thrush.   NECK: No mass in the neck.   LYMPH: No palpable  lymph nodes in the neck, supraclavicular, submandibular, or axilla.   LUNGS: Decreased air entry and few rhonchi that clear with cough. No wheezing. No rales.   ABDOMEN: Benign. Nontender. No obvious palpable mass or organomegaly.   NEUROLOGIC: Grossly nonfocal. Moving all extremities, but did not test the gait. The patient is currently in a wheelchair.   HEART: Regular. There is left sternal border murmur. Her  affect and mood are appropriate. There is 2+ symmetric lower extremity edema with pitting and dry skin in the lower extremities.   LABS/STUDIES: Recently on 02/17/2012 the ferritin was 9 and the iron saturation was also low. The hemoglobin was 7.4. A unit of blood was given and the next day the hemoglobin was 8.2. Today the hemoglobin is 7.2. Platelets are 148,000. White count is unremarkable.   IMPRESSION: The patient is with symptomatic anemia. She is iron deficient. There is a history of prior bleeding. There is a history of prior iron deficiency. There is no clear history of recent gross bleeding, but there has been intermittent dark stools prior to taking oral iron. The patient has dropped her hemoglobin 1 gram in a week since transfused last week. The patient has some orthostasis, some dizziness, and some fatigue and is symptomatic of anemia. The patient has borderline thrombocytopenia that is likely spurious, consumption could be due to iron deficiency. The patient had borderline lymphopenia that is likely spurious or due to recent steroids. There is underlying heart disease, although she had been evaluated with a stress test in March 2012 and coronary artery disease in a patient status post a stent.   PLAN: The patient will be hospitalized initially for 1 unit of blood. Watch the vital signs and the hemoglobin. Recheck the platelets. Recheck the lymphocytes. Could add a HIV to blood check because of the lymphopenia. We will check stool guaiacs. We are planning a gastrointestinal consultation, which the patient prior declined but now agrees, even if the stool guaiacs are negative as there was prior gross bleeding last year not worked up and recent dark stools prior to taking iron. Would plan to likely supplement oral iron with intravenous iron. If stools are          negative and the patient does not need luminal examination, would continue p.o. iron, but otherwise looks like intravenous iron  would be appropriate to potentially overt for further transfusions or drop in hemoglobin.  ____________________________ Simonne Come Inez Pilgrim, MD rgg:slb D: 02/24/2012 15:08:04 ET T: 02/24/2012 15:42:36 ET JOB#: [id number]  cc: Simonne Come. Inez Pilgrim, MD, <Dictator> Dallas Schimke MD ELECTRONICALLY SIGNED 02/25/2012 14:08

## 2015-04-16 NOTE — Consult Note (Signed)
PATIENT NAME:  Morgan [name], Morgan [name] MR#:  [id number] DATE OF BIRTH:  21-Jul-1935  DATE OF CONSULTATION:  02/24/2012  REFERRING PHYSICIAN:   CONSULTING PHYSICIAN:  Manya Silvas, MD  HISTORY OF PRESENT ILLNESS: Patient is a 80 year old white female patient of Dr. Frazier Richards who has a history of iron deficiency anemia, recent hospitalization, transfused 2 units in mid February, another unit in late February and now hemoglobin is down to 7 and she has been readmitted for repeat transfusion. I was asked to see her in consultation for her iron deficiency anemia.   REVIEW OF SYSTEMS: She reports some lightheadedness, slight dizziness, mild shortness of breath with minimal exertion, swelling of her legs, reflux at times although taking Nexium. She denies any retrosternal pain, however, she does complain that when her hemoglobin gets low she has a pressure-like feeling in her upper chest and in her shoulders.   HABITS: Smokes. Does not drink.   FAMILY HISTORY: Positive for lung cancer.   PAST MEDICAL HISTORY:  1. Coronary artery disease with stent placement, cardiac catheterization 2004, 2005. 2. Colonoscopy, upper endoscopy performed last was in 2004.  3. She was told she had some birthmarks.   OTHER MEDICAL PROBLEMS: 1. Hypertension.  2. Diabetes.  3. History of cervical cancer. 4. Previous history of duodenal ulcer years ago.  5. Irritable bowel syndrome.  6. History of heart murmur.  7. History of thickening of the heart muscle.  8. Fatty liver.   MEDICATIONS:  1. Iron pills. 2. Zocor 20 mg at evening.  3. Nexium 40 mg a day.  4. Lisinopril 40 mg a day.  5. Actos 45 mg a day.  6. Plavix 75 mg a day.  7. Diltiazem CD 240 mg a day.  8. Torsemide 10 mg a day. She increased IT to 20 mg a day because of swelling of the legs.  9. Lactulose p.r.n.   PHYSICAL EXAMINATION:  GENERAL: White female looks somewhat pale, no acute distress.   HEENT: Sclera nonicteric. Conjunctivae  somewhat pale. Tongue is somewhat pale. Trachea is in the midline.   CHEST: Clear.   HEART: 2/6 murmur, few irregular beats.   ABDOMEN: Soft. Bowel sounds present. No hepatosplenomegaly. No masses. No bruits.   EXTREMITIES: Show edema with swelling of her legs as well.   LABORATORY, DIAGNOSTIC AND RADIOLOGICAL DATA: Glucose 168, BUN 25, creatinine 1.44, sodium 140, potassium 4.8, chloride 99, CO2 29, total protein 6.7, albumin 3.1, total bilirubin 0.5, alkaline phosphatase 79, SGOT 18, SGPT 20, white blood count 5.9, hemoglobin 7 that is after a unit of blood, platelet count 136. B+ blood with negative antibody screen. PT 13.2.   ASSESSMENT: Recurrent iron deficiency anemia with a history of possible AVMs.   PLAN:  1. Do an upper endoscopy for tomorrow depending on the findings of that do a colonoscopy possibly the following day or Thursday. Because of her history of duodenal ulcer I would like to be sure she does not have a duodenal ulcer or AVMs of the upper GI tract before proceeding with the colonoscopy prep.  2. Recommend transfuse hemoglobin above 8 to give some mild cushion for any further oozing. Dr. Inez Pilgrim informed me that she has been on iron and it may be very difficult to clean her colon out to the degree of necessary visualization to make an effective procedure. We may have to postpone her colonoscopy for a week.  ____________________________ Manya Silvas, MD rte:cms D: 02/24/2012 19:57:01 ET T: 02/25/2012 05:35:20 ET JOB#:  [id number]  cc: Manya Silvas, MD, <Dictator> Simonne Come. Inez Pilgrim, MD Ocie Cornfield. Ouida Sills, MD Manya Silvas MD ELECTRONICALLY SIGNED 02/26/2012 14:51

## 2015-04-16 NOTE — Consult Note (Signed)
Hgb up to 8.7, EGD showed no area of bleeding.  Talked to Dr. Cynda Acres and will try to do colonoscopy next week as outpatient.    Electronic Signatures: Manya Silvas (MD)  (Signed on 05-Mar-13 11:51)  Authored  Last Updated: 05-Mar-13 11:51 by Manya Silvas (MD)

## 2015-04-17 LAB — SURGICAL PATHOLOGY

## 2015-04-19 LAB — FERRITIN: Ferritin (ARMC): 191 ng/mL

## 2015-04-19 LAB — CANCER CENTER HEMOGLOBIN: HGB: 10.2 g/dL — ABNORMAL LOW (ref 12.0–16.0)

## 2015-04-23 NOTE — Discharge Summary (Signed)
PATIENT NAME:  Morgan [name], Morgan [name] MR#:  [id number] DATE OF BIRTH:  1935/09/26  DATE OF ADMISSION:  01/17/2015 DATE OF DISCHARGE:  01/19/2015  DISCHARGE DIAGNOSIS: Anemia, gastrointestinal bleed, had endoscopic evaluation by Dr. Vira Agar.   HISTORY OF PRESENT ILLNESS: The patient is a 80 year old female with known history of recurrent anemia and history of GI bleeding (status post EGD and colonoscopy in the past with history of colon polyps, angiodysplasias in the colon) who follows with Dr. Inez Pilgrim on a regular basis and gets transfusion support to maintain hemoglobin close to 10 or higher. The patient was seen at [facility] on January 26th in the absence of Dr. Inez Pilgrim when her hemoglobin had dropped to 9 grams from 10.9 grams on January 19th. The patient also complained of significant  amount of bright red blood in the stools and significant weakness and intermittent dizziness on getting up and ambulating. She was admitted to the hospital for further treatment. For her past medical history, social history, home medication, physical exam and review of systems please refer to history and physical for details.   HOSPITAL COURSE: Hemoglobin on admission was 9 grams, Dr. Inez Pilgrim had maintenance hemoglobin target close to 10 or higher since the patient developed significant symptoms below this number. Given her worsening fatigue and some dyspnea on exertion along with ongoing GI bleed, the patient was given transfusion support to try and get hemoglobin as close to 10 as possible. Gastroenterology was consulted. Dr. Vira Agar saw the patient and EGD done on January 28th reported 2 nonbleeding angiectasias in the stomach treated with argon plasma coagulation, and colonoscopy done on January 27th reported multiple nonbleeding colonic angiectasias treated with argon plasma coagulation, 1 small polyp in the transverse colon resected, internal hemorrhoids. The patient's diet was slowly advanced as per GI recommendations. On  January 28th, hemoglobin was 9.5, the patient received 1 more unit of packed red blood cells given her significant weakness and otherwise she was feeling better overall. She was tolerating full liquid diet well. She was therefore discharged home in stable condition with frequent follow-up appointments given at [facility] for continued anemia monitoring.   DISCHARGE MEDICATIONS: Diltiazem/hydrochloride CD 240 mg extended-release 1 daily, lisinopril 40 mg p.o. once daily, simvastatin 20 mg p.o. at bedtime, Tylenol 500 mg at bedtime p.r.n. for inability to sleep, docusate 50 mg p.o. b.i.d. as needed, pantoprazole 40 mg delayed release 1 tablet daily, Klor-Con 10 mEq extended release 1 tablet b.i.d., torsemide 20 mg 2 tablets q. a.m.   DISCHARGE DIET: Rest by gastroenterology, Dr. Vira Agar.   DISCHARGE ACTIVITY: As tolerated.   DISCHARGE FOLLOWUP: Lab appointment at the [facility] on February 1st for hemoglobin monitoring. Keep appointment on February 3rd with Dr. Inez Pilgrim, the same as already scheduled.   In between this patient was advised to call or come to the ER in case of any recurrent bleeding or acute sickness.   ____________________________ Rhett Bannister Ma Hillock, MD srp:sb D: 01/24/2015 09:00:58 ET T: 01/24/2015 10:54:54 ET JOB#: [id number]  cc: Luisfernando Brightwell R. Ma Hillock, MD, <Dictator> Alveta Heimlich MD ELECTRONICALLY SIGNED 01/26/2015 15:35

## 2015-04-23 NOTE — Consult Note (Signed)
Pt here for bleeding, hx of AVM, has aortic stenosis.  Chest clear, heart 3/6 systolic murmur. Plan to do colonoscopy and EGD tomorrow.  Electronic Signatures: Manya Silvas (MD)  (Signed on 26-Jan-16 17:25)  Authored  Last Updated: 26-Jan-16 17:25 by Manya Silvas (MD)

## 2015-04-23 NOTE — Consult Note (Signed)
Pt with AVM of stomach (small) cauterized and 2 in proximal ascending colon and 3 larger ones in the cecum that were difficult to [facility] in position and some of these were destroyed but not sure about how much remains.  Also 3 polyps removed from cecum and this confused the issue due to bleeding from the polyp removal.  Discussed with patient and family.  Can't be sure none in small bowel.  AVM like these are often seen more frequently in the presence of aortic stenosis.  Will need to repeat procedure in a few weeks.    Electronic Signatures: Manya Silvas (MD)  (Signed on 27-Jan-16 18:31)  Authored  Last Updated: 27-Jan-16 18:31 by Manya Silvas (MD)

## 2015-04-23 NOTE — H&P (Signed)
PATIENT NAME:  Morgan [name], Morgan [name] MR#:  [id number] DATE OF BIRTH:  25-Mar-1935  CHIEF COMPLAINT AND REASON FOR ADMISSION: Persistent, large, bright red blood in stools last few days, drop in hemoglobin from 10.9 to 9.0 grams in 1 week, diarrhea.   HISTORY OF PRESENT ILLNESS: Morgan [name] is a 80 year old female with past medical history remarkable for recurrent anemia with history of GI bleeding (status post EGD and colonoscopy in the past with history of colon polyps, angiodysplasias in the colon), who follows with Dr. Inez Pilgrim on a very regular basis and gets transfusion support to maintain hemoglobin close to 10 or higher, since she otherwise becomes very symptomatic from the anemia. She also has medical history remarkable for hypertension, GERD, hyperlipidemia, coronary artery disease, fatty liver, IBS, diabetes mellitus, partial hysterectomy, cardiac stent placement, and smoking.   The patient came to cancer center today and had labs drawn which showed hemoglobin had dropped to 9.0 grams from 10.9 grams on January 19. She stated that last few days she has had significant amount of bright red blood in the stools, along with 3-4 bowel movements daily with lower abdominal discomfort and pressure, but no major pain. She denies any vomiting or hematemesis. No fever or chills. She feels very weak, sometimes dizzy on getting up and ambulating. She has dyspnea on exertion, but no chest pain, palpitations, orthopnea, or PND.   PAST MEDICAL AND SURGICAL HISTORY: As in HPI above.   FAMILY HISTORY: Noncontributory.   SOCIAL HISTORY: History of chronic smoking. Denies alcohol usage. Limited physical activity.   ALLERGIES: INCLUDE DARVON, CONTRAST DYE.   HOME MEDICATIONS:  Diltiazem CD 240 mg daily, docusate 50 mg b.i.d. p.r.n., Klor-Con 10 mEq b.i.d., lisinopril 40 mg daily, pantoprazole extended release 40 mg daily, simvastatin 20 mg daily, torsemide 20 mg 2 tablets q. a.m. Tylenol 500 mg at bedtime p.r.n.    REVIEW OF SYSTEMS: CONSTITUTIONAL: As in HPI. No fevers, chills.  HEENT: No headaches, epistaxis, ear, or jaw pain. No sinus symptoms.  CARDIAC: As in HPI. LUNGS: No new cough, dyspnea at rest, hemoptysis, or chest pain.  GASTROINTESTINAL: As in HPI. GENITOURINARY: No dysuria or hematuria.  SKIN: No new rashes or pruritus.  HEMATOLOGIC: GI bleeding as above.  EXTREMITIES: No new pain or swelling.  MUSCULOSKELETAL: No new joint pains or swelling.  NEUROLOGIC: No new focal weakness, seizures, or loss of consciousness.  ENDOCRINE: No polyuria or polydipsia.   PHYSICAL EXAMINATION: GENERAL: Patient is sitting in wheelchair, weak, and tired-looking, otherwise alert and oriented and converses appropriately. No acute distress at rest. No icterus. Pallor present.  VITAL SIGNS: 97.6, 65, 18, 125/71, 99% on room air.  HEENT: Normocephalic, atraumatic. Extraocular movements intact. No icterus.  NECK: Supple without lymphadenopathy.  CARDIOVASCULAR: S1, S2, regular rate and rhythm.  LUNGS: Show bilateral good air entry, no crepitations or rhonchi.  ABDOMEN: Soft, nontender. No hepatomegaly. Bowel sounds present.  EXTREMITIES: Shows trace edema, no cyanosis.  SKIN: Shows no major bruising or ecchymosis.  NEUROLOGIC: Limited exam. Cranial nerves seem intact. Moves all extremities spontaneously. Gait not checked.   LABORATORY DATA: Hemoglobin 9.0 (was 10.9 on January 19).   IMPRESSION AND PLAN: 1.  Recurrent anemia, history of iron deficiency, and history of gastrointestinal blood loss in the past with ongoing bright red bleeding in the stools. The patient is having significant worsening of fatigue along with some dyspnea on exertion in the last week or so. Hemoglobin is down from 10.90 to 9.0. Given this, we will admit  to hospital and give 1 unit packed red blood cell transfusion, since Dr. Inez Pilgrim usually tries to maintain hemoglobin close to 10 or higher to control her symptoms. The patient was  explained about this and also about benefits and risks of blood transfusion. She is agreeable to pursue this and signed consent form. We will monitor hemoglobin once every 6 hours and continue transfusion as indicated.  2.  Consult gastroenterology. The patient is well known to Dr. Vira Agar, to see if she needs endoscopic evaluation. We will keep n.p.o. except ice and medications. We will keep in intravenous 1/2 normal saline 100 mL per hour for maintenance.  3.  Hypertension, blood pressure currently doing steady. Continue to monitor.  4.  Continue other home medications same as before. We will hold Lasix at this time given ongoing gastrointestinal bleed.   The patient and family present explained about details. They are agreeable to this plan.    ____________________________ Rhett Bannister. Ma Hillock, MD srp:LT D: 01/17/2015 17:25:06 ET T: 01/17/2015 18:42:41 ET JOB#: [id number]  cc: Chaundra Abreu R. Ma Hillock, MD, <Dictator> Alveta Heimlich MD ELECTRONICALLY SIGNED 01/18/2015 13:55

## 2015-04-23 NOTE — Consult Note (Signed)
PATIENT NAME:  Morgan [name], Morgan [name] MR#:  [id number] DATE OF BIRTH:  1935-05-10  DATE OF CONSULTATION:  01/17/2015  REFERRING PHYSICIAN:   CONSULTING PHYSICIAN:  Joelene Millin A. Jerelene Redden, ANP (Adult Nurse Practitioner)  REFERRING PHYSICIAN:  Leia Alf, MD.   CONSULTING PHYSICIAN: Gaylyn Cheers, MD/Gesselle Fitzsimons Jerelene Redden, ANP.    REASON FOR CONSULTATION: Bright red blood per rectum, acute on chronic anemia with known history of gastric and colonic angiectasias.   HISTORY OF PRESENT ILLNESS: This 80 year old patient reports that she had not experienced melena or rectal bleeding from October 20 2014 through 01/04/2015. She did have a drift in hemoglobin from 13 to 9 range and received 1 unit PRBC on January 14 and 01/06/2015. Last Thursday, she passed large amounts of bright red blood per rectum, that occurred intermittently, most recently yesterday x 4. She says every day she was passing more blood. There is blood with mucus and stools are loose. She has a heavy feeling in the abdomen. The patient was seen in the hematologist office today for her next unit of blood and mentioned this history. She was subsequently admitted for further evaluation and management. Vital signs are stable. The patient does notice mild shortness of breath, but no chest pain. She has had no further significant rectal bleeding since last night.   This patient has a long history of GI bleed and had an upper endoscopy done for melena most recently 07/06/2014 performed by Dr. Vira Agar, showing a normal esophagus, a single medium-sized angiectasia without bleeding found in the greater curvature of the gastric body. This was coagulated. The duodenum was normal. Her most recent colonoscopy was performed 03/05/2012 for heme-positive stool, IDA secondary to chronic GI blood loss and this revealed 9 small polyps, a few nonbleeding colonic angiectasias treated with thermal therapy as well as internal hemorrhoids. The patient reports she has had 2 capsule  endoscopies in the past. She avoids aspirin and other anticoagulants, nonsteroidal anti-inflammatory drugs,  and alcohol. She does report antibiotics last month for a UTI, and her stools have been more loose than normal and just not exactly normal in the last month. She does note decreased appetite and  food does not taste well.  The patient does disclose that she ate a bag of popcorn 3 weeks ago and she does actively eat nuts on a regular basis. She has had no nausea or vomiting, heartburn, reflux, dysphagia, or upper GI complaints. She thinks her dry weight has been stable.   PAST MEDICAL HISTORY:  1. Hyperlipidemia.  2. Chronic IDA.  3. CAD with stent.  4. PUD.  5. GERD.  6. Chronic angiectasia.  7. Fatty liver.  8. History of cervical cancer.  9. IBS.  10. Nicotine abuse.  11. Hypertension.  12. Diabetes mellitus.  13. Aortic Stenosis with murmur  PAST SURGICAL HISTORY:  1. Partial hysterectomy.  2. Cardiac catheterization with coronary artery stent.  3.  Port a Cath placed > 1 year ago  HABITS: Negative tobacco. Positive tobacco 1/2 pack per day x 50 years.   FAMILY HISTORY: Negative for colon cancer.   REVIEW OF SYSTEMS: 12 systems reviewed and negative with the exceptions as noted in the history of present illness. She does also reports that she is off-balance, but no falls.   PHYSICAL EXAMINATION:  VITAL SIGNS: 97.6, 65, 18, 125/71. Pulse oximetry on room air is 99%.  GENERAL: Elderly Caucasian female resting in bed, looks comfortable.  HEENT: Head is normocephalic. Conjunctivae are pink. Sclerae are anicteric. Oral  mucosa moist and intact.  NECK: Supple. Trachea is midline.  CARDIAC: S1, S2 with systolic murmur which is not new.  LUNGS: CTA. Respirations are eupneic.  ABDOMEN: Soft, positive adiposity noted, no organomegaly, no significant tenderness.  RECTAL: Deferred with pending colonoscopy tomorrow.  EXTREMITIES: Lower extremities with thickened legs, she reports  this is not edema.  SKIN:  Warm, very dry and scaly in the lower extremities. Some bruising noted.  CHEST:  Right anterior chest wall with Port-A-Cath intact and in use.  MUSCULOSKELETAL: No joint inflammation or swelling. She is ambulating to the bathroom with nursing assistance.  NEUROLOGIC: Cranial nerves II through XII grossly intact. The patient able to assist movement in bed.  PSYCHIATRIC: Affect and mood within normal, very pleasant.   LABORATORY DATA: Admission blood work per hematology with hemoglobin 9.0. No other laboratory studies to report.   IMPRESSION:  1.  The patient admitted with acute on chronic anemia with known history of gastric and colonic angiodysplasia. She has required cauterization with most recent esophagogastroduodenoscopy 07/06/2014 and colonoscopy 03/05/2012.  2.  She reports change in bowel habits a month proceeding her bright red blood per rectum last week. This is more frequent, but not severe diarrhea, no nocturnal awakening.  3.  Gastroesophageal reflux disease.   4.  History of coronary artery disease with stent, aortic stenosis, very slight shortness of breath. Stable vital signs.   PLAN:  1.  Recommend a.m. pro time with INR, we will check platelet count with CBC in the morning. 2.  Recommend colonoscopy and upper endoscopy early tomorrow afternoon by Dr. Vira Agar. The patient will be on clear liquids today, and drink 3 liters of her TriLyte this afternoon and 1 liter starting at 0600 tomorrow. Then she should be n.p.o. for the procedure.  3.  This case was discussed with Dr. Vira Agar in collaboration  of care.  4.  She does not require antibiotic prophylaxsis 5.  Follow up per Dr. Vira Agar pending study results.   Thank you for this consultation.   These services provided by Joelene Millin A. Jerelene Redden, MS, APRN, BC, ANP under collaborative agreement with Gaylyn Cheers, MD.     ____________________________ Janalyn Harder Jerelene Redden, ANP (Adult Nurse  Practitioner) kam:bu D: 01/17/2015 15:31:24 ET T: 01/17/2015 16:07:25 ET JOB#: [id number]  cc: Joelene Millin A. Jerelene Redden, ANP (Adult Nurse Practitioner), <Dictator> Janalyn Harder Sherlyn Hay, MSN, ANP-BC Adult Nurse Practitioner ELECTRONICALLY SIGNED 01/18/2015 10:03

## 2015-04-25 ENCOUNTER — Other Ambulatory Visit: Payer: Self-pay | Admitting: *Deleted

## 2015-04-25 ENCOUNTER — Encounter: Payer: Self-pay | Admitting: Emergency Medicine

## 2015-04-25 ENCOUNTER — Inpatient Hospital Stay: Payer: Medicare Other | Attending: Internal Medicine

## 2015-04-25 ENCOUNTER — Emergency Department
Admission: EM | Admit: 2015-04-25 | Discharge: 2015-04-25 | Disposition: A | Discharge status: Home / Self Care | Payer: Medicare Other | Attending: Emergency Medicine | Admitting: Emergency Medicine

## 2015-04-25 ENCOUNTER — Telehealth: Payer: Self-pay | Admitting: *Deleted

## 2015-04-25 ENCOUNTER — Other Ambulatory Visit: Payer: Self-pay | Admitting: Internal Medicine

## 2015-04-25 DIAGNOSIS — D509 Iron deficiency anemia, unspecified: Secondary | ICD-10-CM | POA: Diagnosis not present

## 2015-04-25 DIAGNOSIS — E119 Type 2 diabetes mellitus without complications: Secondary | ICD-10-CM | POA: Diagnosis not present

## 2015-04-25 DIAGNOSIS — K625 Hemorrhage of anus and rectum: Secondary | ICD-10-CM | POA: Diagnosis present

## 2015-04-25 DIAGNOSIS — Z79899 Other long term (current) drug therapy: Secondary | ICD-10-CM | POA: Insufficient documentation

## 2015-04-25 DIAGNOSIS — K31811 Angiodysplasia of stomach and duodenum with bleeding: Secondary | ICD-10-CM

## 2015-04-25 DIAGNOSIS — I10 Essential (primary) hypertension: Secondary | ICD-10-CM | POA: Insufficient documentation

## 2015-04-25 DIAGNOSIS — D649 Anemia, unspecified: Secondary | ICD-10-CM | POA: Diagnosis not present

## 2015-04-25 DIAGNOSIS — Z87891 Personal history of nicotine dependence: Secondary | ICD-10-CM | POA: Diagnosis not present

## 2015-04-25 HISTORY — DX: Essential (primary) hypertension: I10

## 2015-04-25 LAB — SAMPLE TO BLOOD BANK

## 2015-04-25 LAB — HEMOGLOBIN: Hemoglobin: 9.4 g/dL — ABNORMAL LOW (ref 12.0–16.0)

## 2015-04-25 MED ORDER — SODIUM CHLORIDE 0.9 % IV SOLN: Freq: Once | INTRAVENOUS | Status: DC

## 2015-04-25 NOTE — Telephone Encounter (Signed)
Patient in clinic. hgb is 9.4. Pt feels short of breath today and fatigue. She thinks she has a "GI bleed again." Waiting on md orders to proceed. Pt states that she can not do a blood transfusion on Thursdays.  She is requesting to come back tomorrow for blood transfusion.

## 2015-04-25 NOTE — Discharge Instructions (Signed)
As we discussed, Dr. Inez Pilgrim has arranged for you to come to the same-day surgery clinic at 10 AM tomorrow for a blood transfusion.  Information about how to get to that location as included in these documents. He will also most likely want to follow-up with you on Friday and will help you arrange that when he sees you tomorrow, or urine call his clinic for more details. If he develop new or worsening symptoms in the meantime, please return to the emergency department. Please note that as we discussed, are medical director, Dr. Corky Downs, plans to waive your ED visit charges today. Please keep these papers for future reference.  Anemia, Nonspecific Anemia is a condition in which the concentration of red blood cells or hemoglobin in the blood is below normal. Hemoglobin is a substance in red blood cells that carries oxygen to the tissues of the body. Anemia results in not enough oxygen reaching these tissues.  CAUSES  Common causes of anemia include:   Excessive bleeding. Bleeding may be internal or external. This includes excessive bleeding from periods (in women) or from the intestine.   Poor nutrition.   Chronic kidney, thyroid, and liver disease.  Bone marrow disorders that decrease red blood cell production.  Cancer and treatments for cancer.  HIV, AIDS, and their treatments.  Spleen problems that increase red blood cell destruction.  Blood disorders.  Excess destruction of red blood cells due to infection, medicines, and autoimmune disorders. SIGNS AND SYMPTOMS   Minor weakness.   Dizziness.   Headache.  Palpitations.   Shortness of breath, especially with exercise.   Paleness.  Cold sensitivity.  Indigestion.  Nausea.  Difficulty sleeping.  Difficulty concentrating. Symptoms may occur suddenly or they may develop slowly.  DIAGNOSIS  Additional blood tests are often needed. These help your health care provider determine the best treatment. Your health care  provider will check your stool for blood and look for other causes of blood loss.  TREATMENT  Treatment varies depending on the cause of the anemia. Treatment can include:   Supplements of iron, vitamin H06, or folic acid.   Hormone medicines.   A blood transfusion. This may be needed if blood loss is severe.   Hospitalization. This may be needed if there is significant continual blood loss.   Dietary changes.  Spleen removal. HOME CARE INSTRUCTIONS Keep all follow-up appointments. It often takes many weeks to correct anemia, and having your health care provider check on your condition and your response to treatment is very important. SEEK IMMEDIATE MEDICAL CARE IF:   You develop extreme weakness, shortness of breath, or chest pain.   You become dizzy or have trouble concentrating.  You develop heavy vaginal bleeding.   You develop a rash.   You have bloody or black, tarry stools.   You faint.   You vomit up blood.   You vomit repeatedly.   You have abdominal pain.  You have a fever or persistent symptoms for more than 2-3 days.   You have a fever and your symptoms suddenly get worse.   You are dehydrated.  MAKE SURE YOU:  Understand these instructions.  Will watch your condition.  Will get help right away if you are not doing well or get worse. Document Released: 01/16/2005 Document Revised: 08/11/2013 Document Reviewed: 06/04/2013 [facility] Patient Information 2015 [place], [place]. This information is not intended to replace advice given to you by your health care provider. Make sure you discuss any questions you have with your  health care provider.  Iron Deficiency Anemia Anemia is a condition in which there are less red blood cells or hemoglobin in the blood than normal. Hemoglobin is the part of red blood cells that carries oxygen. Iron deficiency anemia is anemia caused by too little iron. It is the most common type of anemia. It may leave you  tired and short of breath. CAUSES   Lack of iron in the diet.  Poor absorption of iron, as seen with intestinal disorders.  Intestinal bleeding.  Heavy periods. SIGNS AND SYMPTOMS  Mild anemia may not be noticeable. Symptoms may include:  Fatigue.  Headache.  Pale skin.  Weakness.  Tiredness.  Shortness of breath.  Dizziness.  Cold hands and feet.  Fast or irregular heartbeat. DIAGNOSIS  Diagnosis requires a thorough evaluation and physical exam by your health care provider. Blood tests are generally used to confirm iron deficiency anemia. Additional tests may be done to find the underlying cause of your anemia. These may include:  Testing for blood in the stool (fecal occult blood test).  A procedure to see inside the colon and rectum (colonoscopy).  A procedure to see inside the esophagus and stomach (endoscopy). TREATMENT  Iron deficiency anemia is treated by correcting the cause of the deficiency. Treatment may involve:  Adding iron-rich foods to your diet.  Taking iron supplements. Pregnant or breastfeeding women need to take extra iron because their normal diet usually does not provide the required amount.  Taking vitamins. Vitamin C improves the absorption of iron. Your health care provider may recommend that you take your iron tablets with a glass of orange juice or vitamin C supplement.  Medicines to make heavy menstrual flow lighter.  Surgery. HOME CARE INSTRUCTIONS   Take iron as directed by your health care provider.  If you cannot tolerate taking iron supplements by mouth, talk to your health care provider about taking them through a vein (intravenously) or an injection into a muscle.  For the best iron absorption, iron supplements should be taken on an empty stomach. If you cannot tolerate them on an empty stomach, you may need to take them with food.  Do not drink milk or take antacids at the same time as your iron supplements. Milk and  antacids may interfere with the absorption of iron.  Iron supplements can cause constipation. Make sure to include fiber in your diet to prevent constipation. A stool softener may also be recommended.  Take vitamins as directed by your health care provider.  Eat a diet rich in iron. Foods high in iron include liver, lean beef, whole-grain bread, eggs, dried fruit, and dark green leafy vegetables. SEEK IMMEDIATE MEDICAL CARE IF:   You faint. If this happens, do not drive. Call your local emergency services (911 in [place] if no other help is available.  You have chest pain.  You feel nauseous or vomit.  You have severe or increased shortness of breath with activity.  You feel weak.  You have a rapid heartbeat.  You have unexplained sweating.  You become light-headed when getting up from a chair or bed. MAKE SURE YOU:   Understand these instructions.  Will watch your condition.  Will get help right away if you are not doing well or get worse. Document Released: 12/06/2000 Document Revised: 12/14/2013 Document Reviewed: 08/16/2013 [facility] Patient Information 2015 [place], [place]. This information is not intended to replace advice given to you by your health care provider. Make sure you discuss any questions you have  with your health care provider. ° °

## 2015-04-25 NOTE — Telephone Encounter (Signed)
Dr. Inez Pilgrim returned call at 1200 noon. He advises that patient needs to be transfused today. However, he prefers that the patient go to the ED to be evaluated for bright red rectal bleeding.  Given the fact that she has multiple medical problems including heart failure, he feels that the ED would be the best place to manage the patient's care.  I spoke to the patient.  She is agreeable to transfer to the ED. Will proceed with call report to ED RN.

## 2015-04-25 NOTE — Telephone Encounter (Signed)
Spoke with patient and her brother. She is agreeable to go to ED.  Patient escorted to the ED at 1245. Handoff to ED RN Vaughan Basta performed. Lab to send extracrossmatch tube to blood bank band.  Pt needs evaluation of GI bleed and possible supportive blood products per Dr. Inez Pilgrim.

## 2015-04-25 NOTE — ED Provider Notes (Signed)
[facility] Emergency Department Provider Note    ____________________________________________  Time seen: 15:10  I have reviewed the triage vital signs and the nursing notes.   HISTORY  Chief Complaint Rectal Bleeding  acute on chronic anemia   HPI Morgan [name] is a 80 y.o. female with a history of persistent gastrointestinal bleeding, chronic anemia, and history of frequent blood transfusions who presents [facility] a [facility] transfusion. She has a history of this issue requiring weekly hemoglobin checks and frequent iron and/or PRBC transfusions; she reports that she has had a total of 52 pints of blood transfused over the last few years. She was sent to the emergency department today because the transfusion clinic is struggling with the recent computer changes and had limited the number of chairs they have available in their clinic for their chronic patients.  In this scenario where her hemoglobin has dropped slightly, they transfuse a unit of PRBCs and then recheck in about 2 days for another possible transfusion. Of note, she has been worked up extensively by Dr. Vira Agar (GI) as an inpatient and as an outpatient to discover the source of her GI bleeding.  The patient denies any acute symptoms symptoms including no weakness, fever, chills, chest pain, shortness of breath, nausea, vomiting, abdominal pain, or dysuria. She is comfortable and eating [employer] in the exam room.    Past Medical History  Diagnosis Date  . Blood transfusion without reported diagnosis   . Diabetes mellitus without complication   . Hypertension   . Cancer     cervical    There are no active problems to display for this patient.   Past Surgical History  Procedure Laterality Date  . Abdominal hysterectomy      No current outpatient prescriptions on file.  Allergies Darvon and Nylon  History reviewed. No pertinent family history.  Social  History History  Substance Use Topics  . Smoking status: Former Research scientist (life sciences)  . Smokeless tobacco: Not on file  . Alcohol Use: No    Review of Systems  Constitutional: Negative for fever. Eyes: Negative for visual changes. ENT: Negative for sore throat. Cardiovascular: Negative for chest pain. Respiratory: Negative for shortness of breath. Gastrointestinal: Negative for abdominal pain, vomiting and diarrhea. Genitourinary: Negative for dysuria. Musculoskeletal: Negative for back pain. Skin: Negative for rash. Neurological: Negative for headaches, focal weakness or numbness.   10-point ROS otherwise negative.  ____________________________________________   PHYSICAL EXAM:  VITAL SIGNS: ED Triage Vitals  Enc Vitals Group     BP 04/25/15 1330 147/78 mmHg     Pulse Rate 04/25/15 1330 68     Resp 04/25/15 1330 18     Temp 04/25/15 1330 97.9 F (36.6 C)     Temp Source 04/25/15 1330 Oral     SpO2 04/25/15 1330 97 %     Weight 04/25/15 1330 182 lb (82.555 kg)     Height 04/25/15 1330 '5\' 4"'$  (1.626 m)     Head Cir --      Peak Flow --      Pain Score 04/25/15 1331 0     Pain Loc --      Pain Edu? --      Excl. in [place]? --      Constitutional: Alert and oriented. Well appearing and in no distress. Eyes: Conjunctivae are normal. PERRL. Normal extraocular movements. ENT   Head: Normocephalic and atraumatic.   Nose: No congestion/rhinnorhea.   Mouth/Throat: Mucous membranes are moist.  Neck: No stridor. Hematological/Lymphatic/Immunilogical: No cervical lymphadenopathy. Cardiovascular: Normal rate, regular rhythm. Normal and symmetric distal pulses are present in all extremities. No murmurs, rubs, or gallops. Respiratory: Normal respiratory effort without tachypnea nor retractions. Breath sounds are clear and equal bilaterally. No wheezes/rales/rhonchi. Gastrointestinal: Soft and nontender. No distention. No abdominal bruits. There is no CVA tenderness. The patient  declined a rectal exam, stating that she knows she has GI bleeding and it would not tell us anything useful. Genitourinary: Deferred. Musculoskeletal: Nontender with normal range of motion in all extremities. No joint effusions.  No lower extremity tenderness nor edema. Neurologic:  Normal speech and language. No gross focal neurologic deficits are appreciated. Speech is normal. No gait instability. Skin:  Skin is warm, dry and intact. No rash noted. Psychiatric: Mood and affect are normal. Speech and behavior are normal. Patient exhibits appropriate insight and judgment.  ____________________________________________    LABS (pertinent positives/negatives)  Hemoglobin 9.4 (performed earlier today in the clinic)  ____________________________________________   EKG  Not indicated  ____________________________________________    RADIOLOGY  Not indicated  ____________________________________________   PROCEDURES  Procedure(s) performed: None  Critical Care performed: No  ____________________________________________   INITIAL IMPRESSION / ASSESSMENT AND PLAN / ED COURSE  Pertinent labs & imaging results that were available during my care of the patient were reviewed by me and considered in my medical decision making (see chart for details).  I paged Dr. Inez Pilgrim to discuss the case.  He explained more of the issue as described in history of present illness, which essentially is a result of computer changes with Epic and limited number of transfusion chairs in the clinic. He was unaware that the same-day surgery clinic and also perform an outpatient transfusions for stable patients. I expressed my concern that she would be better served in an outpatient setting as she is hemodynamically stable, well-appearing, in no acute distress, and has had this problem for years. After some discussions with hospital administration and schedulers, Dr. Inez Pilgrim stated that he was able to  accommodate her tomorrow morning at 10 AM in the same-day surgery clinic for transfusion and will see her on Friday as well to follow up.  I discussed the situation with the patient and her daughter and offered a unit of PRBCs transfusion here in the emergency department today or to follow up tomorrow at 10 AM as offered by Dr. Inez Pilgrim, and the patient prefers to leave today in follow-up tomorrow.  I discussed the case with Dr.  Corky Downs, the ED medical director, who stated that he would make sure that the patient's ED visit charges were waved given the confusion and the misunderstanding. The patient and her daughter understand and agree with the plan.  ____________________________________________   FINAL CLINICAL IMPRESSION(S) / ED DIAGNOSES  Final diagnoses:  Anemia, unspecified anemia type     Hinda Kehr, MD 04/25/15 [phone]

## 2015-04-25 NOTE — ED Notes (Signed)
Pt reports that she goes to the cancer center, she gets her blood checked. She was told that it was 9.4 down 10.2 from last Thursday. She gets transfusions when she gets below 10 at the cancer center, they told her to come here because they got a new computer system and they could not work her in. She does not want to be admitted.

## 2015-04-26 ENCOUNTER — Ambulatory Visit
Admit: 2015-04-26 | Discharge: 2015-04-26 | Disposition: A | Discharge status: Home / Self Care | Payer: Medicare Other | Source: Ambulatory Visit | Attending: Internal Medicine | Admitting: Internal Medicine

## 2015-04-26 ENCOUNTER — Other Ambulatory Visit: Payer: Self-pay | Admitting: Family Medicine

## 2015-04-26 VITALS — BP 157/77 | HR 60 | Temp 97.8°F | Resp 16

## 2015-04-26 DIAGNOSIS — D500 Iron deficiency anemia secondary to blood loss (chronic): Secondary | ICD-10-CM | POA: Diagnosis not present

## 2015-04-26 DIAGNOSIS — D509 Iron deficiency anemia, unspecified: Secondary | ICD-10-CM | POA: Diagnosis present

## 2015-04-26 LAB — ABO/RH: ABO/RH(D): B POS

## 2015-04-26 MED ORDER — SODIUM CHLORIDE 0.9 % IJ SOLN
INTRAMUSCULAR | Status: AC
  Filled 2015-04-26: qty 3

## 2015-04-26 MED ORDER — HEPARIN SOD (PORK) LOCK FLUSH 100 UNIT/ML IV SOLN
500.0000 [IU] | Freq: Every day | INTRAVENOUS | Status: AC | PRN
  Administered 2015-04-26: 500 [IU]
  Filled 2015-04-26: qty 5

## 2015-04-26 MED ORDER — SODIUM CHLORIDE 0.9 % IJ SOLN
3.0000 mL | INTRAMUSCULAR | Status: AC | PRN
  Administered 2015-04-26: 6 mL

## 2015-04-26 MED ORDER — SODIUM CHLORIDE 0.9 % IJ SOLN
INTRAMUSCULAR | Status: AC
  Administered 2015-04-26: 6 mL
  Filled 2015-04-26: qty 6

## 2015-04-26 MED ORDER — HEPARIN SOD (PORK) LOCK FLUSH 100 UNIT/ML IV SOLN
250.0000 [IU] | INTRAVENOUS | Status: DC | PRN
  Filled 2015-04-26: qty 3

## 2015-04-26 MED ORDER — FUROSEMIDE 10 MG/ML IJ SOLN
20.0000 mg | Freq: Once | INTRAMUSCULAR | Status: AC
  Administered 2015-04-26: 20 mg via INTRAVENOUS

## 2015-04-26 MED ORDER — FUROSEMIDE 10 MG/ML IJ SOLN
INTRAMUSCULAR | Status: AC
  Administered 2015-04-26: 20 mg via INTRAVENOUS
  Filled 2015-04-26: qty 2

## 2015-04-26 MED ORDER — ACETAMINOPHEN 325 MG PO TABS
650.0000 mg | ORAL_TABLET | Freq: Once | ORAL | Status: AC
  Administered 2015-04-26: 650 mg via ORAL

## 2015-04-26 MED ORDER — SODIUM CHLORIDE 0.9 % IV SOLN
250.0000 mL | Freq: Once | INTRAVENOUS | Status: AC
  Administered 2015-04-26: 500 mL via INTRAVENOUS

## 2015-04-26 MED ORDER — DIPHENHYDRAMINE HCL 50 MG/ML IJ SOLN
25.0000 mg | Freq: Once | INTRAMUSCULAR | Status: AC
  Administered 2015-04-26: 25 mg via INTRAVENOUS

## 2015-04-26 MED ORDER — SODIUM CHLORIDE 0.9 % IJ SOLN
10.0000 mL | INTRAMUSCULAR | Status: AC | PRN
  Administered 2015-04-26: 5 mL

## 2015-04-26 MED ORDER — DIPHENHYDRAMINE HCL 50 MG/ML IJ SOLN
INTRAMUSCULAR | Status: AC
  Administered 2015-04-26: 25 mg via INTRAVENOUS
  Filled 2015-04-26: qty 1

## 2015-04-26 MED ORDER — ACETAMINOPHEN 325 MG PO TABS
ORAL_TABLET | ORAL | Status: AC
  Administered 2015-04-26: 650 mg via ORAL
  Filled 2015-04-26: qty 2

## 2015-04-26 MED ORDER — SODIUM CHLORIDE 0.9 % IJ SOLN
INTRAMUSCULAR | Status: AC
  Administered 2015-04-26: 5 mL
  Filled 2015-04-26: qty 3

## 2015-04-28 ENCOUNTER — Ambulatory Visit: Payer: Medicare Other

## 2015-04-28 ENCOUNTER — Inpatient Hospital Stay: Payer: Medicare Other

## 2015-04-28 ENCOUNTER — Inpatient Hospital Stay: Payer: Medicare Other | Admitting: Internal Medicine

## 2015-04-28 DIAGNOSIS — D500 Iron deficiency anemia secondary to blood loss (chronic): Secondary | ICD-10-CM

## 2015-04-28 DIAGNOSIS — D509 Iron deficiency anemia, unspecified: Secondary | ICD-10-CM | POA: Diagnosis not present

## 2015-04-28 DIAGNOSIS — K31811 Angiodysplasia of stomach and duodenum with bleeding: Secondary | ICD-10-CM

## 2015-04-28 LAB — TYPE AND SCREEN
ABO/RH(D): B POS
ABO/RH(D): B POS
Antibody Screen: NEGATIVE
Antibody Screen: NEGATIVE
UNIT DIVISION: 0

## 2015-04-28 LAB — FERRITIN: Ferritin: 118 ng/mL (ref 11–307)

## 2015-04-28 LAB — HEMOGLOBIN AND HEMATOCRIT, BLOOD
HEMATOCRIT: 29.8 % — AB (ref 35.0–47.0)
Hemoglobin: 10.2 g/dL — ABNORMAL LOW (ref 12.0–16.0)

## 2015-05-01 ENCOUNTER — Ambulatory Visit: Payer: Medicare Other | Admitting: Internal Medicine

## 2015-05-01 ENCOUNTER — Other Ambulatory Visit: Payer: Medicare Other

## 2015-05-02 ENCOUNTER — Other Ambulatory Visit: Payer: Self-pay | Admitting: *Deleted

## 2015-05-02 DIAGNOSIS — D500 Iron deficiency anemia secondary to blood loss (chronic): Secondary | ICD-10-CM

## 2015-05-02 LAB — PREPARE RBC (CROSSMATCH)

## 2015-05-02 NOTE — Progress Notes (Signed)
A blood order was entered but an encounter was opened.  An order only encounter should have been opened

## 2015-05-03 ENCOUNTER — Other Ambulatory Visit: Payer: Self-pay | Admitting: Internal Medicine

## 2015-05-03 ENCOUNTER — Inpatient Hospital Stay: Payer: Medicare Other

## 2015-05-03 ENCOUNTER — Inpatient Hospital Stay: Payer: Medicare Other | Admitting: Internal Medicine

## 2015-05-03 DIAGNOSIS — D500 Iron deficiency anemia secondary to blood loss (chronic): Secondary | ICD-10-CM

## 2015-05-03 DIAGNOSIS — D509 Iron deficiency anemia, unspecified: Secondary | ICD-10-CM | POA: Diagnosis not present

## 2015-05-03 LAB — HEMOGLOBIN: Hemoglobin: 9.9 g/dL — ABNORMAL LOW (ref 12.0–16.0)

## 2015-05-03 LAB — SAMPLE TO BLOOD BANK

## 2015-05-04 ENCOUNTER — Other Ambulatory Visit: Payer: Self-pay | Admitting: Internal Medicine

## 2015-05-04 ENCOUNTER — Ambulatory Visit: Payer: Medicare Other

## 2015-05-04 DIAGNOSIS — D500 Iron deficiency anemia secondary to blood loss (chronic): Secondary | ICD-10-CM

## 2015-05-05 ENCOUNTER — Inpatient Hospital Stay: Payer: Medicare Other

## 2015-05-05 VITALS — BP 144/74 | HR 64 | Temp 96.0°F | Resp 18

## 2015-05-05 DIAGNOSIS — D500 Iron deficiency anemia secondary to blood loss (chronic): Secondary | ICD-10-CM

## 2015-05-05 DIAGNOSIS — D509 Iron deficiency anemia, unspecified: Secondary | ICD-10-CM | POA: Diagnosis not present

## 2015-05-05 LAB — PREPARE RBC (CROSSMATCH)

## 2015-05-05 MED ORDER — ACETAMINOPHEN 325 MG PO TABS
650.0000 mg | ORAL_TABLET | Freq: Once | ORAL | Status: AC
  Administered 2015-05-05: 650 mg via ORAL
  Filled 2015-05-05: qty 2

## 2015-05-05 MED ORDER — DIPHENHYDRAMINE HCL 25 MG PO CAPS
25.0000 mg | ORAL_CAPSULE | Freq: Once | ORAL | Status: AC
  Administered 2015-05-05: 25 mg via ORAL
  Filled 2015-05-05: qty 1

## 2015-05-05 MED ORDER — FUROSEMIDE 10 MG/ML IJ SOLN
20.0000 mg | Freq: Once | INTRAMUSCULAR | Status: AC
  Administered 2015-05-05: 20 mg via INTRAVENOUS
  Filled 2015-05-05: qty 2

## 2015-05-05 MED ORDER — SODIUM CHLORIDE 0.9 % IV SOLN
250.0000 mL | Freq: Once | INTRAVENOUS | Status: AC
  Administered 2015-05-05: 250 mL via INTRAVENOUS
  Filled 2015-05-05: qty 250

## 2015-05-05 MED ORDER — HEPARIN SOD (PORK) LOCK FLUSH 100 UNIT/ML IV SOLN
500.0000 [IU] | Freq: Every day | INTRAVENOUS | Status: AC | PRN
  Administered 2015-05-05: 500 [IU]
  Filled 2015-05-05: qty 5

## 2015-05-05 MED ORDER — SODIUM CHLORIDE 0.9 % IJ SOLN
10.0000 mL | INTRAMUSCULAR | Status: DC | PRN
  Filled 2015-05-05: qty 10

## 2015-05-08 ENCOUNTER — Other Ambulatory Visit: Payer: Self-pay | Admitting: *Deleted

## 2015-05-08 DIAGNOSIS — D509 Iron deficiency anemia, unspecified: Secondary | ICD-10-CM

## 2015-05-10 ENCOUNTER — Inpatient Hospital Stay: Payer: Medicare Other | Admitting: Internal Medicine

## 2015-05-10 ENCOUNTER — Inpatient Hospital Stay: Payer: Medicare Other

## 2015-05-10 DIAGNOSIS — D509 Iron deficiency anemia, unspecified: Secondary | ICD-10-CM

## 2015-05-10 DIAGNOSIS — D649 Anemia, unspecified: Secondary | ICD-10-CM

## 2015-05-10 DIAGNOSIS — D500 Iron deficiency anemia secondary to blood loss (chronic): Secondary | ICD-10-CM

## 2015-05-10 LAB — TYPE AND SCREEN
ABO/RH(D): B POS
Antibody Screen: NEGATIVE
Unit division: 0

## 2015-05-10 LAB — SAMPLE TO BLOOD BANK

## 2015-05-10 LAB — HEMOGLOBIN: Hemoglobin: 10.8 g/dL — ABNORMAL LOW (ref 12.0–16.0)

## 2015-05-10 LAB — FERRITIN: Ferritin: 110 ng/mL (ref 11–307)

## 2015-05-10 NOTE — Progress Notes (Signed)
This encounter was created in error - please disregard.

## 2015-05-19 ENCOUNTER — Inpatient Hospital Stay: Payer: Medicare Other

## 2015-05-19 ENCOUNTER — Inpatient Hospital Stay: Payer: Medicare Other | Admitting: Family Medicine

## 2015-05-19 ENCOUNTER — Encounter: Payer: Self-pay | Admitting: Internal Medicine

## 2015-05-19 VITALS — BP 180/67 | HR 76 | Temp 98.7°F | Resp 16 | Wt 182.5 lb

## 2015-05-19 DIAGNOSIS — D500 Iron deficiency anemia secondary to blood loss (chronic): Secondary | ICD-10-CM

## 2015-05-19 DIAGNOSIS — K5520 Angiodysplasia of colon without hemorrhage: Secondary | ICD-10-CM | POA: Insufficient documentation

## 2015-05-19 DIAGNOSIS — D649 Anemia, unspecified: Secondary | ICD-10-CM

## 2015-05-19 DIAGNOSIS — D509 Iron deficiency anemia, unspecified: Secondary | ICD-10-CM | POA: Diagnosis not present

## 2015-05-19 DIAGNOSIS — K760 Fatty (change of) liver, not elsewhere classified: Secondary | ICD-10-CM | POA: Insufficient documentation

## 2015-05-19 DIAGNOSIS — K279 Peptic ulcer, site unspecified, unspecified as acute or chronic, without hemorrhage or perforation: Secondary | ICD-10-CM | POA: Insufficient documentation

## 2015-05-19 DIAGNOSIS — D7589 Other specified diseases of blood and blood-forming organs: Secondary | ICD-10-CM | POA: Insufficient documentation

## 2015-05-19 DIAGNOSIS — C539 Malignant neoplasm of cervix uteri, unspecified: Secondary | ICD-10-CM | POA: Insufficient documentation

## 2015-05-19 LAB — FERRITIN: FERRITIN: 102 ng/mL (ref 11–307)

## 2015-05-19 LAB — SAMPLE TO BLOOD BANK

## 2015-05-19 LAB — HEMOGLOBIN: Hemoglobin: 10.1 g/dL — ABNORMAL LOW (ref 12.0–16.0)

## 2015-05-23 ENCOUNTER — Telehealth: Payer: Self-pay | Admitting: *Deleted

## 2015-05-23 ENCOUNTER — Other Ambulatory Visit: Payer: Self-pay | Admitting: Family Medicine

## 2015-05-23 ENCOUNTER — Emergency Department
Admission: EM | Admit: 2015-05-23 | Discharge: 2015-05-23 | Disposition: A | Discharge status: Home / Self Care | Payer: Medicare Other | Attending: Emergency Medicine | Admitting: Emergency Medicine

## 2015-05-23 DIAGNOSIS — Z87891 Personal history of nicotine dependence: Secondary | ICD-10-CM | POA: Insufficient documentation

## 2015-05-23 DIAGNOSIS — Z79899 Other long term (current) drug therapy: Secondary | ICD-10-CM | POA: Diagnosis not present

## 2015-05-23 DIAGNOSIS — K625 Hemorrhage of anus and rectum: Secondary | ICD-10-CM | POA: Diagnosis present

## 2015-05-23 DIAGNOSIS — E1149 Type 2 diabetes mellitus with other diabetic neurological complication: Secondary | ICD-10-CM | POA: Diagnosis not present

## 2015-05-23 DIAGNOSIS — I10 Essential (primary) hypertension: Secondary | ICD-10-CM | POA: Insufficient documentation

## 2015-05-23 DIAGNOSIS — K922 Gastrointestinal hemorrhage, unspecified: Secondary | ICD-10-CM | POA: Diagnosis not present

## 2015-05-23 LAB — CBC
HCT: 29.2 % — ABNORMAL LOW (ref 35.0–47.0)
Hemoglobin: 9.8 g/dL — ABNORMAL LOW (ref 12.0–16.0)
MCH: 32.3 pg (ref 26.0–34.0)
MCHC: 33.5 g/dL (ref 32.0–36.0)
MCV: 96.4 fL (ref 80.0–100.0)
Platelets: 137 10*3/uL — ABNORMAL LOW (ref 150–440)
RBC: 3.03 MIL/uL — ABNORMAL LOW (ref 3.80–5.20)
RDW: 14.2 % (ref 11.5–14.5)
WBC: 7.1 10*3/uL (ref 3.6–11.0)

## 2015-05-23 LAB — COMPREHENSIVE METABOLIC PANEL
ALBUMIN: 3.5 g/dL (ref 3.5–5.0)
ALK PHOS: 128 U/L — AB (ref 38–126)
ALT: 21 U/L (ref 14–54)
ANION GAP: 10 (ref 5–15)
AST: 24 U/L (ref 15–41)
BUN: 28 mg/dL — AB (ref 6–20)
CHLORIDE: 102 mmol/L (ref 101–111)
CO2: 28 mmol/L (ref 22–32)
Calcium: 9.2 mg/dL (ref 8.9–10.3)
Creatinine, Ser: 1.58 mg/dL — ABNORMAL HIGH (ref 0.44–1.00)
GFR calc Af Amer: 35 mL/min — ABNORMAL LOW (ref >60)
GFR calc non Af Amer: 30 mL/min — ABNORMAL LOW (ref >60)
GLUCOSE: 195 mg/dL — AB (ref 65–99)
Potassium: 5.1 mmol/L (ref 3.5–5.1)
Sodium: 140 mmol/L (ref 135–145)
Total Bilirubin: 0.4 mg/dL (ref 0.3–1.2)
Total Protein: 7.2 g/dL (ref 6.5–8.1)

## 2015-05-23 LAB — TYPE AND SCREEN
ABO/RH(D): B POS
Antibody Screen: NEGATIVE

## 2015-05-23 NOTE — ED Provider Notes (Signed)
[facility] Emergency Department Provider Note   ____________________________________________  Time seen: 6:15 PM I have reviewed the triage vital signs and the triage nursing note.  HISTORY  Chief Complaint GI Bleeding   Historian Patient and her son  HPI Verbie Babic Treiber is a 80 y.o. female who came in for multiple days of bright red blood per rectum, since Friday. She's had multiple loose stools usually in the mornings. She has had this on and off and has had a workup with GI. She has had a history of numerous blood transfusions over the past 3 years. She's not had any dizziness, lightheadedness, passing out, chest pain, or shortness breath. She's had no abdominal pain, she's had no vomiting, she's had no diarrhea or constipation. Symptoms are mild, however she did want to check and see if her hemoglobin had dropped.    Past Medical History  Diagnosis Date  . Blood transfusion without reported diagnosis   . Diabetes mellitus without complication   . Hypertension   . Cancer     cervical    Patient Active Problem List   Diagnosis Date Noted  . Angiodysplasia 05/19/2015  . CA cervix 05/19/2015  . Fatty infiltration of liver 05/19/2015  . Increased MCV 05/19/2015  . Gastroduodenal ulcer 05/19/2015  . Hypercholesterolemia without hypertriglyceridemia 03/06/2015  . Accumulation of fluid in tissues 12/27/2014  . Diabetes 10/25/2014  . Absolute anemia 06/18/2014  . Aortic heart valve narrowing 06/18/2014  . 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 Arteriosclerosis of coronary artery 06/18/2014  . Cardiomyopathy 06/18/2014  . Chronic kidney disease (CKD), stage III (moderate) 06/18/2014  . CAFL (chronic airflow limitation) 06/18/2014  . Type II diabetes mellitus with neurological manifestations 06/18/2014  . Gout 06/18/2014  . Benign hypertension 06/18/2014  . Fothergill's neuralgia 06/18/2014    Past Surgical History  Procedure Laterality Date  .  Abdominal hysterectomy      Current Outpatient Rx  Name  Route  Sig  Dispense  Refill  . CARTIA XT 240 MG 24 hr capsule   Oral   Take 240 mg by mouth daily.           Dispense as written.   Marland Kitchen lisinopril (PRINIVIL,ZESTRIL) 40 MG tablet   Oral   Take 40 mg by mouth daily.         . simvastatin (ZOCOR) 20 MG tablet   Oral   Take 20 mg by mouth at bedtime.            Allergies Atenolol; Darvon; Hydrochlorothiazide; Iodinated diagnostic agents; Nylon; Other; and Pravastatin  No family history on file.  Social History History  Substance Use Topics  . Smoking status: Former Research scientist (life sciences)  . Smokeless tobacco: Not on file  . Alcohol Use: No    Review of Systems  Constitutional: Negative for fever. Eyes: Negative for visual changes. ENT: Negative for sore throat. Cardiovascular: Negative for chest pain. Respiratory: Negative for shortness of breath. Gastrointestinal: Negative for abdominal pain, vomiting and diarrhea. Genitourinary: Negative for dysuria. Musculoskeletal: Negative for back pain. Skin: Negative for rash. Neurological: Negative for headaches, focal weakness or numbness.  ____________________________________________   PHYSICAL EXAM:  VITAL SIGNS: ED Triage Vitals  Enc Vitals Group     BP 05/23/15 1416 173/73 mmHg     Pulse Rate 05/23/15 1416 76     Resp 05/23/15 1416 18     Temp 05/23/15 1416 97.8 F (36.6 C)     Temp Source 05/23/15 1416 Oral  SpO2 05/23/15 1416 99 %     Weight 05/23/15 1416 182 lb (82.555 kg)     Height 05/23/15 1416 '5\' 4"'$  (1.626 m)     Head Cir --      Peak Flow --      Pain Score 05/23/15 1418 0     Pain Loc --      Pain Edu? --      Excl. in [place]? --      Constitutional: Alert and oriented. Well appearing and in no distress. Eyes: Conjunctivae are normal. PERRL. Normal extraocular movements. ENT   Head: Normocephalic and atraumatic.   Nose: No congestion/rhinnorhea.   Mouth/Throat: Mucous membranes are  moist.   Neck: No stridor. Cardiovascular: Normal rate, regular rhythm.  No murmurs, rubs, or gallops. Respiratory: Normal respiratory effort without tachypnea nor retractions. Breath sounds are clear and equal bilaterally. No wheezes/rales/rhonchi. Gastrointestinal: Soft and nontender. No distention.  Genitourinary: Tiny external hemorrhoid which is not irritated. Rectal exam is nontender. There is small pink streaks on the glove. Musculoskeletal: Nontender with normal range of motion in all extremities. No joint effusions.  No lower extremity tenderness nor edema. Neurologic:  Normal speech and language. No gross focal neurologic deficits are appreciated. Skin:  Skin is warm, dry and intact. No rash noted. Psychiatric: Mood and affect are normal. Speech and behavior are normal. Patient exhibits appropriate insight and judgment.  ____________________________________________   EKG   ____________________________________________  LABS (pertinent positives/negatives)  Hemoglobin 9.8 BUN 28 and creatinine 1.58 with electrolytes within normal limits  ____________________________________________  RADIOLOGY Radiologist results reviewed  None __________________________________________  PROCEDURES  Procedure(s) performed: None Critical Care performed: None  ____________________________________________   ED COURSE / ASSESSMENT AND PLAN  Pertinent labs & imaging results that were available during my care of the patient were reviewed by me and considered in my medical decision making (see chart for details).   The patient has stable vital signs, soft and nontender abdomen, and a hemoglobin of 9.8 which is relatively unchanged from her recent hemoglobin a few days ago of 10.1. I discussed with her whether or not to be observed overnight versus going ahead and go home since she's been having bleeding for several days now and her hemoglobin is essentially stable and she is a  symptomatically otherwise. The patient lives with her son and they would both like for her to go home tonight. I've asked her to follow up with the cancer center or her primary care doctor or the emergency department in 1-2 days for recheck of her hemoglobin. She understands return precautions with respect to any additional bleeding or other symptoms including abdominal pain.   ___________________________________________   FINAL CLINICAL IMPRESSION(S) / ED DIAGNOSES   Final diagnoses:  Lower GI bleeding      Lisa Roca, MD 05/24/15 0031

## 2015-05-23 NOTE — Telephone Encounter (Signed)
Pt spoke with Georgeanne Nim, AGNP-C and myself; was advised to go straight to the ER. Pt refused at first, but then agreed to go on in and be evaluated and treated.Marland KitchenMarland Kitchen

## 2015-05-23 NOTE — ED Notes (Signed)
Pt c/o bright red bleeding from rectum since Friday..states she has had 3 loose red stools today..states seen at [facility] center on Friday and [facility] 10..denies pain at present.

## 2015-05-23 NOTE — ED Notes (Signed)
E sig pad not working - pt received instructions and had all questions answered prior to dc

## 2015-05-23 NOTE — Telephone Encounter (Signed)
States she was expecting a call from [facility] on Friday regarding her Ferritin level. Also states she will need an appt this week for labs since she is having rectal bleeding since Thursday

## 2015-05-23 NOTE — Discharge Instructions (Signed)
Your blood pressure heart rate, and hemoglobin are reassuring and stable today after your bloody stools for the past few mornings. We discussed admission overnight for observation or going home and decided to go ahead and send you home. Return to the emergency department for any dizziness, passing out chest pain, shortness breath, abdominal pain, black stools, or any worsening bloody stools. You will need to have your hemoglobin rechecked in one to 3 days. If you're having no symptoms you could probably wait until Friday when you have your appointment, however if you're having any worsening bleeding or any other symptoms she should come to the emergency department for recheck.  Bloody Stools Bloody stools often mean that there is a problem in the digestive tract. Your caregiver may use the term "melena" to describe black, tarry, and bad smelling stools or "hematochezia" to describe red or maroon-colored stools. Blood seen in the stool can be caused by bleeding anywhere along the intestinal tract.  A black stool usually means that blood is coming from the upper part of the gastrointestinal tract (esophagus, stomach, or small bowel). Passing maroon-colored stools or bright red blood usually means that blood is coming from lower down in the large bowel or the rectum. However, sometimes massive bleeding in the stomach or small intestine can cause bright red bloody stools.  Consuming black licorice, lead, iron pills, medicines containing bismuth subsalicylate, or blueberries can also cause black stools. Your caregiver can test black stools to see if blood is present. It is important that the cause of the bleeding be found. Treatment can then be started, and the problem can be corrected. Rectal bleeding may not be serious, but you should not assume everything is okay until you know the cause.It is very important to follow up with your caregiver or a specialist in gastrointestinal problems. CAUSES  Blood in the  stools can come from various underlying causes.Often, the cause is not found during your first visit. Testing is often needed to discover the cause of bleeding in the gastrointestinal tract. Causes range from simple to serious or even life-threatening.Possible causes include:  Hemorrhoids.These are veins that are full of blood (engorged) in the rectum. They cause pain, inflammation, and may bleed.  Anal fissures.These are areas of painful tearing which may bleed. They are often caused by passing hard stool.  Diverticulosis.These are pouches that form on the colon over time, with age, and may bleed significantly.  Diverticulitis.This is inflammation in areas with diverticulosis. It can cause pain, fever, and bloody stools, although bleeding is rare.  Proctitis and colitis. These are inflamed areas of the rectum or colon. They may cause pain, fever, and bloody stools.  Polyps and cancer. Colon cancer is a leading cause of preventable cancer death.It often starts out as precancerous polyps that can be removed during a colonoscopy, preventing progression into cancer. Sometimes, polyps and cancer may cause rectal bleeding.  Gastritis and ulcers.Bleeding from the upper gastrointestinal tract (near the stomach) may travel through the intestines and produce black, sometimes tarry, often bad smelling stools. In certain cases, if the bleeding is fast enough, the stools may not be black, but red and the condition may be life-threatening. SYMPTOMS  You may have stools that are bright red and bloody, that are normal color with blood on them, or that are dark black and tarry. In some cases, you may only have blood in the toilet bowl. Any of these cases need medical care. You may also have:  Pain at the anus  or anywhere in the rectum.  Lightheadedness or feeling faint.  Extreme weakness.  Nausea or vomiting.  Fever. DIAGNOSIS Your caregiver may use the following methods to find the cause of your  bleeding:  Taking a medical history. Age is important. Older people tend to develop polyps and cancer more often. If there is anal pain and a hard, large stool associated with bleeding, a tear of the anus may be the cause. If blood drips into the toilet after a bowel movement, bleeding hemorrhoids may be the problem. The color and frequency of the bleeding are additional considerations. In most cases, the medical history provides clues, but seldom the final answer.  A visual and finger (digital) exam. Your caregiver will inspect the anal area, looking for tears and hemorrhoids. A finger exam can provide information when there is tenderness or a growth inside. In men, the prostate is also examined.  Endoscopy. Several types of small, long scopes (endoscopes) are used to view the colon.  In the office, your caregiver may use a rigid, or more commonly, a flexible viewing sigmoidoscope. This exam is called flexible sigmoidoscopy. It is performed in 5 to 10 minutes.  A more thorough exam is accomplished with a colonoscope. It allows your caregiver to view the entire 5 to 6 foot long colon. Medicine to help you relax (sedative) is usually given for this exam. Frequently, a bleeding lesion may be present beyond the reach of the sigmoidoscope. So, a colonoscopy may be the best exam to start with. Both exams are usually done on an outpatient basis. This means the patient does not stay overnight in the hospital or surgery center.  An upper endoscopy may be needed to examine your stomach. Sedation is used and a flexible endoscope is put in your mouth, down to your stomach.  A barium enema X-ray. This is an X-ray exam. It uses liquid barium inserted by enema into the rectum. This test alone may not identify an actual bleeding point. X-rays highlight abnormal shadows, such as those made by lumps (tumors), diverticuli, or colitis. TREATMENT  Treatment depends on the cause of your bleeding.   For bleeding from  the stomach or colon, the caregiver doing your endoscopy or colonoscopy may be able to stop the bleeding as part of the procedure.  Inflammation or infection of the colon can be treated with medicines.  Many rectal problems can be treated with creams, suppositories, or warm baths.  Surgery is sometimes needed.  Blood transfusions are sometimes needed if you have lost a lot of blood.  For any bleeding problem, let your caregiver know if you take aspirin or other blood thinners regularly. HOME CARE INSTRUCTIONS   Take any medicines exactly as prescribed.  Keep your stools soft by eating a diet high in fiber. Prunes (1 to 3 a day) work well for many people.  Drink enough water and fluids to keep your urine clear or pale yellow.  Take sitz baths if advised. A sitz bath is when you sit in a bathtub with warm water for 10 to 15 minutes to soak, soothe, and cleanse the rectal area.  If enemas or suppositories are advised, be sure you know how to use them. Tell your caregiver if you have problems with this.  Monitor your bowel movements to look for signs of improvement or worsening. SEEK MEDICAL CARE IF:   You do not improve in the time expected.  Your condition worsens after initial improvement.  You develop any new symptoms. SEEK  IMMEDIATE MEDICAL CARE IF:   You develop severe or prolonged rectal bleeding.  You vomit blood.  You feel weak or faint.  You have a fever. MAKE SURE YOU:  Understand these instructions.  Will watch your condition.  Will get help right away if you are not doing well or get worse. Document Released: 11/29/2002 Document Revised: 03/02/2012 Document Reviewed: 04/26/2011 [facility] Patient Information 2015 [place], [place]. This information is not intended to replace advice given to you by your health care provider. Make sure you discuss any questions you have with your health care provider.

## 2015-05-24 ENCOUNTER — Other Ambulatory Visit: Payer: Self-pay | Admitting: Family Medicine

## 2015-05-24 DIAGNOSIS — D500 Iron deficiency anemia secondary to blood loss (chronic): Secondary | ICD-10-CM

## 2015-05-25 ENCOUNTER — Other Ambulatory Visit: Payer: Self-pay | Admitting: Family Medicine

## 2015-05-25 ENCOUNTER — Inpatient Hospital Stay: Payer: Medicare Other | Attending: Internal Medicine

## 2015-05-25 DIAGNOSIS — E119 Type 2 diabetes mellitus without complications: Secondary | ICD-10-CM | POA: Diagnosis not present

## 2015-05-25 DIAGNOSIS — Z79899 Other long term (current) drug therapy: Secondary | ICD-10-CM | POA: Insufficient documentation

## 2015-05-25 DIAGNOSIS — I129 Hypertensive chronic kidney disease with stage 1 through stage 4 chronic kidney disease, or unspecified chronic kidney disease: Secondary | ICD-10-CM | POA: Insufficient documentation

## 2015-05-25 DIAGNOSIS — Z87891 Personal history of nicotine dependence: Secondary | ICD-10-CM | POA: Diagnosis not present

## 2015-05-25 DIAGNOSIS — D509 Iron deficiency anemia, unspecified: Secondary | ICD-10-CM

## 2015-05-25 DIAGNOSIS — D696 Thrombocytopenia, unspecified: Secondary | ICD-10-CM | POA: Diagnosis not present

## 2015-05-25 DIAGNOSIS — Z8541 Personal history of malignant neoplasm of cervix uteri: Secondary | ICD-10-CM | POA: Insufficient documentation

## 2015-05-25 DIAGNOSIS — K5521 Angiodysplasia of colon with hemorrhage: Secondary | ICD-10-CM | POA: Insufficient documentation

## 2015-05-25 DIAGNOSIS — D631 Anemia in chronic kidney disease: Secondary | ICD-10-CM | POA: Diagnosis not present

## 2015-05-25 DIAGNOSIS — D500 Iron deficiency anemia secondary to blood loss (chronic): Secondary | ICD-10-CM

## 2015-05-25 DIAGNOSIS — N189 Chronic kidney disease, unspecified: Secondary | ICD-10-CM | POA: Insufficient documentation

## 2015-05-25 LAB — CBC WITH DIFFERENTIAL/PLATELET
Basophils Absolute: 0.1 10*3/uL (ref 0–0.1)
Basophils Relative: 1 %
EOS ABS: 0.1 10*3/uL (ref 0–0.7)
Eosinophils Relative: 2 %
HCT: 26 % — ABNORMAL LOW (ref 35.0–47.0)
HEMOGLOBIN: 8.6 g/dL — AB (ref 12.0–16.0)
Lymphocytes Relative: 11 %
Lymphs Abs: 0.6 10*3/uL — ABNORMAL LOW (ref 1.0–3.6)
MCH: 32 pg (ref 26.0–34.0)
MCHC: 33.1 g/dL (ref 32.0–36.0)
MCV: 96.7 fL (ref 80.0–100.0)
Monocytes Absolute: 0.3 10*3/uL (ref 0.2–0.9)
Monocytes Relative: 6 %
NEUTROS PCT: 80 %
Neutro Abs: 4.5 10*3/uL (ref 1.4–6.5)
PLATELETS: 140 10*3/uL — AB (ref 150–440)
RBC: 2.69 MIL/uL — AB (ref 3.80–5.20)
RDW: 14.8 % — ABNORMAL HIGH (ref 11.5–14.5)
WBC: 5.6 10*3/uL (ref 3.6–11.0)

## 2015-05-25 LAB — IRON AND TIBC
Iron: 75 ug/dL (ref 28–170)
Saturation Ratios: 25 % (ref 10.4–31.8)
TIBC: 296 ug/dL (ref 250–450)
UIBC: 221 ug/dL

## 2015-05-25 LAB — PREPARE RBC (CROSSMATCH)

## 2015-05-25 LAB — FERRITIN: FERRITIN: 74 ng/mL (ref 11–307)

## 2015-05-26 ENCOUNTER — Inpatient Hospital Stay: Payer: Medicare Other

## 2015-05-26 ENCOUNTER — Ambulatory Visit: Payer: Medicare Other | Admitting: Family Medicine

## 2015-05-26 ENCOUNTER — Ambulatory Visit: Payer: Medicare Other

## 2015-05-26 ENCOUNTER — Inpatient Hospital Stay (HOSPITAL_BASED_OUTPATIENT_CLINIC_OR_DEPARTMENT_OTHER): Payer: Medicare Other | Admitting: Internal Medicine

## 2015-05-26 ENCOUNTER — Encounter (INDEPENDENT_AMBULATORY_CARE_PROVIDER_SITE_OTHER): Payer: Self-pay

## 2015-05-26 ENCOUNTER — Other Ambulatory Visit: Payer: Medicare Other

## 2015-05-26 VITALS — BP 154/73

## 2015-05-26 VITALS — BP 185/63 | HR 69 | Temp 97.0°F | Ht 64.0 in | Wt 183.0 lb

## 2015-05-26 DIAGNOSIS — D509 Iron deficiency anemia, unspecified: Secondary | ICD-10-CM | POA: Diagnosis not present

## 2015-05-26 DIAGNOSIS — D631 Anemia in chronic kidney disease: Secondary | ICD-10-CM | POA: Diagnosis not present

## 2015-05-26 DIAGNOSIS — D696 Thrombocytopenia, unspecified: Secondary | ICD-10-CM

## 2015-05-26 DIAGNOSIS — E119 Type 2 diabetes mellitus without complications: Secondary | ICD-10-CM

## 2015-05-26 DIAGNOSIS — I129 Hypertensive chronic kidney disease with stage 1 through stage 4 chronic kidney disease, or unspecified chronic kidney disease: Secondary | ICD-10-CM | POA: Diagnosis not present

## 2015-05-26 DIAGNOSIS — N189 Chronic kidney disease, unspecified: Secondary | ICD-10-CM

## 2015-05-26 DIAGNOSIS — D500 Iron deficiency anemia secondary to blood loss (chronic): Secondary | ICD-10-CM

## 2015-05-26 DIAGNOSIS — Z8541 Personal history of malignant neoplasm of cervix uteri: Secondary | ICD-10-CM

## 2015-05-26 LAB — TYPE AND SCREEN
ABO/RH(D): B POS
Antibody Screen: NEGATIVE
UNIT DIVISION: 0

## 2015-05-26 MED ORDER — DIPHENHYDRAMINE HCL 25 MG PO CAPS: 25.0000 mg | ORAL_CAPSULE | Freq: Once | ORAL | Status: DC

## 2015-05-26 MED ORDER — EPOETIN ALFA 10000 UNIT/ML IJ SOLN
10000.0000 [IU] | Freq: Once | INTRAMUSCULAR | Status: AC
  Administered 2015-05-26: 10000 [IU] via SUBCUTANEOUS
  Filled 2015-05-26: qty 2

## 2015-05-26 MED ORDER — SODIUM CHLORIDE 0.9 % IV SOLN
250.0000 mL | Freq: Once | INTRAVENOUS | Status: DC
  Filled 2015-05-26: qty 250

## 2015-05-26 MED ORDER — HEPARIN SOD (PORK) LOCK FLUSH 100 UNIT/ML IV SOLN: 250.0000 [IU] | INTRAVENOUS | Status: DC | PRN

## 2015-05-26 MED ORDER — ACETAMINOPHEN 325 MG PO TABS: 650.0000 mg | ORAL_TABLET | Freq: Once | ORAL | Status: DC

## 2015-05-31 ENCOUNTER — Inpatient Hospital Stay: Payer: Medicare Other

## 2015-05-31 DIAGNOSIS — I129 Hypertensive chronic kidney disease with stage 1 through stage 4 chronic kidney disease, or unspecified chronic kidney disease: Secondary | ICD-10-CM | POA: Diagnosis not present

## 2015-05-31 DIAGNOSIS — D500 Iron deficiency anemia secondary to blood loss (chronic): Secondary | ICD-10-CM

## 2015-05-31 LAB — CBC WITH DIFFERENTIAL/PLATELET
Basophils Absolute: 0 10*3/uL (ref 0–0.1)
Basophils Relative: 1 %
EOS ABS: 0.1 10*3/uL (ref 0–0.7)
Eosinophils Relative: 2 %
HCT: 26.1 % — ABNORMAL LOW (ref 35.0–47.0)
HEMOGLOBIN: 8.7 g/dL — AB (ref 12.0–16.0)
LYMPHS PCT: 9 %
Lymphs Abs: 0.5 10*3/uL — ABNORMAL LOW (ref 1.0–3.6)
MCH: 32.6 pg (ref 26.0–34.0)
MCHC: 33.2 g/dL (ref 32.0–36.0)
MCV: 98 fL (ref 80.0–100.0)
Monocytes Absolute: 0.4 10*3/uL (ref 0.2–0.9)
Monocytes Relative: 8 %
Neutro Abs: 4.2 10*3/uL (ref 1.4–6.5)
Neutrophils Relative %: 80 %
PLATELETS: 119 10*3/uL — AB (ref 150–440)
RBC: 2.66 MIL/uL — ABNORMAL LOW (ref 3.80–5.20)
RDW: 15.1 % — ABNORMAL HIGH (ref 11.5–14.5)
WBC: 5.2 10*3/uL (ref 3.6–11.0)

## 2015-05-31 LAB — FERRITIN: Ferritin: 52 ng/mL (ref 11–307)

## 2015-06-04 ENCOUNTER — Other Ambulatory Visit: Payer: Self-pay | Admitting: *Deleted

## 2015-06-04 DIAGNOSIS — D509 Iron deficiency anemia, unspecified: Secondary | ICD-10-CM

## 2015-06-07 ENCOUNTER — Inpatient Hospital Stay: Payer: Medicare Other

## 2015-06-07 VITALS — BP 125/73 | HR 61 | Temp 97.3°F | Resp 17

## 2015-06-07 DIAGNOSIS — I129 Hypertensive chronic kidney disease with stage 1 through stage 4 chronic kidney disease, or unspecified chronic kidney disease: Secondary | ICD-10-CM | POA: Diagnosis not present

## 2015-06-07 DIAGNOSIS — D509 Iron deficiency anemia, unspecified: Secondary | ICD-10-CM

## 2015-06-07 DIAGNOSIS — D500 Iron deficiency anemia secondary to blood loss (chronic): Secondary | ICD-10-CM

## 2015-06-07 LAB — HEMOGLOBIN: HEMOGLOBIN: 9.2 g/dL — AB (ref 12.0–16.0)

## 2015-06-07 MED ORDER — EPOETIN ALFA 10000 UNIT/ML IJ SOLN
10000.0000 [IU] | Freq: Once | INTRAMUSCULAR | Status: AC
  Administered 2015-06-07: 10000 [IU] via SUBCUTANEOUS
  Filled 2015-06-07: qty 2

## 2015-06-11 NOTE — Progress Notes (Signed)
[facility]  Telephone:(336) [phone] Fax:(336) [phone]     ID: VINCIE LINN OB: [birth year]  MR#: [id number]  WYO#:[id number]  Patient Care Team: Kirk Ruths, MD as PCP - General (Internal Medicine)  CHIEF COMPLAINT/DIAGNOSIS:  Anemia - secondary to chronic renal insufficiency and anemia of iron deficiency, h/o GI blood loss possible AV malformations.. Followed by Dr.Gittin until now - HX IDA IN THE PAST,  2. ANEMIA , BM BX DONE 1999, NORMAL, LATER IDA 02/2007, 3. MILD TROMBOCYTOPENIA    HIV AND HEP C NEG  4. LYMPHOPENIA  5. EGD AND COLONOSCOPY 02/2012 , .ANGIODYSPLASIA COLON, CAUTERY , RESECTION 9 SMALL POLYPS, 03/05/12   6 CONTINUED TRANSFUSION REQUIREMENT. NO EVIDENCE HEMOLYSIS OR OTHER BM DISORDER, REPEAT EPO LEVEL LOWER, RETICS HAVE SHOWN ACTIVE MARROW, ALL CONSISTENT WITH CONTINUED INTERMITTENT GI BLOOD LOSS.  CAMERA STUDY  NEW FINDINGS OF 2 MORE ANGIODYSPLASIA. 7. HOSPITALIZED [facility] 01/2013, 2 UNITS PRBC, EGD DONE.. May 28, evaluated by cardiology ,  angiogram cardiac stenting, placed on home oxygen. On june 4, thoracentesis.  Continue to follow for iron support and monitior hgb, transfuse prn, procrit prn..first dose procrit 9/9. 8. GIB 12/2014, EGD AND COLON.  HISTORY OF PRESENT ILLNESS:  Patient returns for continued hemat. Repeat labs on 6/2 shows Hb low at 8.6 but overall steady. Patient has been getting PRBC tx to keep Hb closer to 10 but after detailed discussion today states she does not feel much benefit whether Hb is 9 or 10. She sometimes notices pinkish staining of water after BMs but denies BRBPR or clots. Denies other bleeding symptoms. Has chronic weakness and dyspnea on exertion. Denies angina, orthopnea or PND. No paresthesias in extremities.   REVIEW OF SYSTEMS:   ROS As in HPI above. In addition, no fever, chills. No new headaches or focal weakness.  No new sore throat, cough, hemoptysis or chest pain. No dizziness or palpitation. No abdominal pain,  constipation, diarrhea, dysuria or hematuria. No new skin rash or bleeding symptoms. No new paresthesias in extremities.    PAST MEDICAL HISTORY: Reviewed Past Medical History  Diagnosis Date  . Blood transfusion without reported diagnosis   . Diabetes mellitus without complication   . Hypertension   . Cancer     cervical    PAST SURGICAL HISTORY:Reviewed Past Surgical History  Procedure Laterality Date  . Abdominal hysterectomy      FAMILY HISTORY:Reviewed No family history on file.  ADVANCED DIRECTIVES:  <no information>  SOCIAL HISTORY: Reviewed History  Substance Use Topics  . Smoking status: Former Research scientist (life sciences)  . Smokeless tobacco: Not on file  . Alcohol Use: No    Allergies  Allergen Reactions  . Atenolol Other (See Comments)    fatigue  . Darvon [Propoxyphene] Hives  . Hydrochlorothiazide Other (See Comments)    Fatigue and cramps  . Iodinated Diagnostic Agents Other (See Comments)    Questionable GI bleed  . Nylon Hives  . Other Other (See Comments)    Nylon sutures  . Pravastatin Other (See Comments)    Severe cramping    Current Outpatient Prescriptions  Medication Sig Dispense Refill  . CARTIA XT 240 MG 24 hr capsule Take 240 mg by mouth daily.    Marland Kitchen lisinopril (PRINIVIL,ZESTRIL) 40 MG tablet Take 40 mg by mouth daily.    . pantoprazole (PROTONIX) 20 MG tablet Take 20 mg by mouth daily.    . simvastatin (ZOCOR) 20 MG tablet Take 20 mg by mouth at bedtime.     Marland Kitchen  torsemide (DEMADEX) 20 MG tablet Take 20 mg by mouth daily.     No current facility-administered medications for this visit.    OBJECTIVE: Filed Vitals:   05/26/15 0908  BP: 185/63  Pulse: 69  Temp: 97 F (36.1 C)     Body mass index is 31.4 kg/(m^2).       GENERAL: Patient is alert and oriented and in no acute distress. There is no icterus. Pallor present. HEENT: EOMs intact. No cervical lymphadenopathy. CVS: S1S2, regular LUNGS: Bilaterally clear to auscultation, no  rhonchi. ABDOMEN: Soft, nontender. No hepatosplenomegaly clinically.  EXTREMITIES: No pedal edema.  LAB RESULTS: 6/2 - WBC 5.6, Hb 8.6, plts 140. Ferritin 74, serum iron 75, iron saturation 25%.    Component Value Date/Time   NA 140 05/23/2015 1433   NA 139 01/18/2015 0545   K 5.1 05/23/2015 1433   K 3.9 01/18/2015 0545   CL 102 05/23/2015 1433   CL 105 01/18/2015 0545   CO2 28 05/23/2015 1433   CO2 25 01/18/2015 0545   GLUCOSE 195* 05/23/2015 1433   GLUCOSE 179* 01/18/2015 0545   BUN 28* 05/23/2015 1433   BUN 29* 01/18/2015 0545   CREATININE 1.58* 05/23/2015 1433   CREATININE 1.80* 01/18/2015 0545   CALCIUM 9.2 05/23/2015 1433   CALCIUM 8.7 01/18/2015 0545   PROT 7.2 05/23/2015 1433   PROT 6.6 06/20/2014 1101   ALBUMIN 3.5 05/23/2015 1433   ALBUMIN 3.1* 06/20/2014 1101   AST 24 05/23/2015 1433   AST 32 06/20/2014 1101   ALT 21 05/23/2015 1433   ALT 31 06/20/2014 1101   ALKPHOS 128* 05/23/2015 1433   ALKPHOS 125* 06/20/2014 1101   BILITOT 0.4 05/23/2015 1433   GFRNONAA 30* 05/23/2015 1433   GFRNONAA 28* 06/20/2014 1101   GFRAA 35* 05/23/2015 1433   GFRAA 33* 06/20/2014 1101     STUDIES:   ASSESSMENT / PLAN:   1.  Anemia - secondary to chronic renal insufficiency and anemia of iron deficiency, h/o GI blood loss possible AV malformations. Recent serum Creatinine 1.58 on 05/23/15, calculated Cr clearance 35-39 mL/min). Serum EPO level 29.9 on 07/27/14 - reviewed labs and d/w patient in detail. Recent iron study on 05/25/15 is unremarkable. No obvious ongoing/active GI bleeding. Patient was explained that anemia is also from underlying renal insufficiency and serum EPO level is inappropriately low for degree of anemia. Have also explained that with frequent PRBC tx she may develop alloimmunization and recommended judicious usage of blood tx. Have recommended trying ESA therapy with Procrit to see if it will improve Hb closer to 10 and help avoid transfusions as much as possible  and also explained possible response rates to Procrit and possible side effects including but not limited to risk of thromboembolic phenomena. Patient is agreeable to start with low dose Procrit and dose escalate if no side effects, will try Procrit 10000 units SQ qweekly if Hb is <10. Next MD f/u on 07/05/15 with labs and make further plan of management. 2. Thrombocytopenia - mild. Prior w/u by Dr.Gittin did not reveal obvious etiology. Continue to monitor fo now. 3. In between visits, she was advised to call or come to ER in case of progressive anemia symptoms or acute sickness. Patient is agreeable to this plan.    Leia Alf, MD   06/11/2015 11:21 PM

## 2015-06-13 ENCOUNTER — Other Ambulatory Visit: Payer: Self-pay

## 2015-06-14 ENCOUNTER — Inpatient Hospital Stay: Payer: Medicare Other

## 2015-06-14 VITALS — BP 144/82 | HR 70 | Temp 97.0°F | Resp 18

## 2015-06-14 DIAGNOSIS — D500 Iron deficiency anemia secondary to blood loss (chronic): Secondary | ICD-10-CM

## 2015-06-14 DIAGNOSIS — D509 Iron deficiency anemia, unspecified: Secondary | ICD-10-CM

## 2015-06-14 DIAGNOSIS — I129 Hypertensive chronic kidney disease with stage 1 through stage 4 chronic kidney disease, or unspecified chronic kidney disease: Secondary | ICD-10-CM | POA: Diagnosis not present

## 2015-06-14 LAB — HEMOGLOBIN: HEMOGLOBIN: 9 g/dL — AB (ref 12.0–16.0)

## 2015-06-14 MED ORDER — SODIUM CHLORIDE 0.9 % IJ SOLN
10.0000 mL | Freq: Once | INTRAMUSCULAR | Status: AC
  Administered 2015-06-14: 10 mL
  Filled 2015-06-14: qty 10

## 2015-06-14 MED ORDER — HEPARIN SOD (PORK) LOCK FLUSH 100 UNIT/ML IV SOLN
500.0000 [IU] | Freq: Once | INTRAVENOUS | Status: AC
  Administered 2015-06-14: 500 [IU] via INTRAVENOUS

## 2015-06-14 MED ORDER — EPOETIN ALFA 10000 UNIT/ML IJ SOLN
10000.0000 [IU] | Freq: Once | INTRAMUSCULAR | Status: AC
  Administered 2015-06-14: 10000 [IU] via SUBCUTANEOUS
  Filled 2015-06-14: qty 2

## 2015-06-21 ENCOUNTER — Inpatient Hospital Stay: Payer: Medicare Other

## 2015-06-21 VITALS — BP 157/72 | HR 69

## 2015-06-21 DIAGNOSIS — I129 Hypertensive chronic kidney disease with stage 1 through stage 4 chronic kidney disease, or unspecified chronic kidney disease: Secondary | ICD-10-CM | POA: Diagnosis not present

## 2015-06-21 DIAGNOSIS — D509 Iron deficiency anemia, unspecified: Secondary | ICD-10-CM

## 2015-06-21 DIAGNOSIS — D500 Iron deficiency anemia secondary to blood loss (chronic): Secondary | ICD-10-CM

## 2015-06-21 LAB — HEMOGLOBIN: Hemoglobin: 9.3 g/dL — ABNORMAL LOW (ref 12.0–16.0)

## 2015-06-21 MED ORDER — EPOETIN ALFA 10000 UNIT/ML IJ SOLN
10000.0000 [IU] | Freq: Once | INTRAMUSCULAR | Status: AC
  Administered 2015-06-21: 10000 [IU] via SUBCUTANEOUS
  Filled 2015-06-21: qty 2

## 2015-06-28 ENCOUNTER — Other Ambulatory Visit: Payer: Self-pay

## 2015-06-28 ENCOUNTER — Inpatient Hospital Stay: Payer: Medicare Other

## 2015-06-28 ENCOUNTER — Inpatient Hospital Stay: Payer: Medicare Other | Attending: Internal Medicine

## 2015-06-28 VITALS — BP 150/76 | HR 71 | Temp 97.0°F | Resp 18

## 2015-06-28 DIAGNOSIS — N189 Chronic kidney disease, unspecified: Secondary | ICD-10-CM | POA: Insufficient documentation

## 2015-06-28 DIAGNOSIS — D631 Anemia in chronic kidney disease: Secondary | ICD-10-CM | POA: Diagnosis not present

## 2015-06-28 DIAGNOSIS — Z87891 Personal history of nicotine dependence: Secondary | ICD-10-CM | POA: Diagnosis not present

## 2015-06-28 DIAGNOSIS — Z79899 Other long term (current) drug therapy: Secondary | ICD-10-CM | POA: Diagnosis not present

## 2015-06-28 DIAGNOSIS — I350 Nonrheumatic aortic (valve) stenosis: Secondary | ICD-10-CM | POA: Diagnosis not present

## 2015-06-28 DIAGNOSIS — D696 Thrombocytopenia, unspecified: Secondary | ICD-10-CM | POA: Diagnosis not present

## 2015-06-28 DIAGNOSIS — E119 Type 2 diabetes mellitus without complications: Secondary | ICD-10-CM | POA: Diagnosis not present

## 2015-06-28 DIAGNOSIS — D509 Iron deficiency anemia, unspecified: Secondary | ICD-10-CM | POA: Diagnosis not present

## 2015-06-28 DIAGNOSIS — D500 Iron deficiency anemia secondary to blood loss (chronic): Secondary | ICD-10-CM

## 2015-06-28 DIAGNOSIS — I129 Hypertensive chronic kidney disease with stage 1 through stage 4 chronic kidney disease, or unspecified chronic kidney disease: Secondary | ICD-10-CM | POA: Diagnosis not present

## 2015-06-28 LAB — HEMOGLOBIN: Hemoglobin: 8.6 g/dL — ABNORMAL LOW (ref 12.0–16.0)

## 2015-06-28 MED ORDER — EPOETIN ALFA 10000 UNIT/ML IJ SOLN: 10000.0000 [IU] | Freq: Once | INTRAMUSCULAR | Status: DC

## 2015-06-28 MED ORDER — EPOETIN ALFA 10000 UNIT/ML IJ SOLN
10000.0000 [IU] | Freq: Once | INTRAMUSCULAR | Status: AC
Start: 2015-06-28 — End: 2015-06-28
  Administered 2015-06-28: 10000 [IU] via SUBCUTANEOUS
  Filled 2015-06-28: qty 2

## 2015-07-05 ENCOUNTER — Inpatient Hospital Stay (HOSPITAL_BASED_OUTPATIENT_CLINIC_OR_DEPARTMENT_OTHER): Payer: Medicare Other | Admitting: Internal Medicine

## 2015-07-05 ENCOUNTER — Inpatient Hospital Stay: Payer: Medicare Other

## 2015-07-05 VITALS — BP 151/80 | HR 76 | Temp 95.1°F | Resp 18 | Ht 64.0 in | Wt 187.4 lb

## 2015-07-05 DIAGNOSIS — D649 Anemia, unspecified: Secondary | ICD-10-CM

## 2015-07-05 DIAGNOSIS — D509 Iron deficiency anemia, unspecified: Secondary | ICD-10-CM | POA: Diagnosis not present

## 2015-07-05 DIAGNOSIS — I129 Hypertensive chronic kidney disease with stage 1 through stage 4 chronic kidney disease, or unspecified chronic kidney disease: Secondary | ICD-10-CM

## 2015-07-05 DIAGNOSIS — D696 Thrombocytopenia, unspecified: Secondary | ICD-10-CM

## 2015-07-05 DIAGNOSIS — D631 Anemia in chronic kidney disease: Secondary | ICD-10-CM | POA: Diagnosis not present

## 2015-07-05 DIAGNOSIS — N189 Chronic kidney disease, unspecified: Secondary | ICD-10-CM

## 2015-07-05 DIAGNOSIS — Z79899 Other long term (current) drug therapy: Secondary | ICD-10-CM

## 2015-07-05 DIAGNOSIS — D500 Iron deficiency anemia secondary to blood loss (chronic): Secondary | ICD-10-CM

## 2015-07-05 LAB — CBC WITH DIFFERENTIAL/PLATELET
BASOS ABS: 0 10*3/uL (ref 0–0.1)
Basophils Relative: 1 %
EOS ABS: 0.1 10*3/uL (ref 0–0.7)
Eosinophils Relative: 1 %
HCT: 25.6 % — ABNORMAL LOW (ref 35.0–47.0)
Hemoglobin: 8.4 g/dL — ABNORMAL LOW (ref 12.0–16.0)
LYMPHS ABS: 0.5 10*3/uL — AB (ref 1.0–3.6)
LYMPHS PCT: 8 %
MCH: 30.9 pg (ref 26.0–34.0)
MCHC: 33 g/dL (ref 32.0–36.0)
MCV: 93.8 fL (ref 80.0–100.0)
Monocytes Absolute: 0.5 10*3/uL (ref 0.2–0.9)
Monocytes Relative: 8 %
Neutro Abs: 4.7 10*3/uL (ref 1.4–6.5)
Neutrophils Relative %: 82 %
PLATELETS: 134 10*3/uL — AB (ref 150–440)
RBC: 2.73 MIL/uL — ABNORMAL LOW (ref 3.80–5.20)
RDW: 14.3 % (ref 11.5–14.5)
WBC: 5.7 10*3/uL (ref 3.6–11.0)

## 2015-07-05 LAB — SAMPLE TO BLOOD BANK

## 2015-07-05 LAB — IRON AND TIBC
IRON: 36 ug/dL (ref 28–170)
SATURATION RATIOS: 10 % — AB (ref 10.4–31.8)
TIBC: 360 ug/dL (ref 250–450)
UIBC: 324 ug/dL

## 2015-07-05 LAB — FERRITIN: Ferritin: 21 ng/mL (ref 11–307)

## 2015-07-05 LAB — PREPARE RBC (CROSSMATCH)

## 2015-07-05 MED ORDER — EPOETIN ALFA 20000 UNIT/ML IJ SOLN
20000.0000 [IU] | Freq: Once | INTRAMUSCULAR | Status: AC
  Administered 2015-07-05: 20000 [IU] via SUBCUTANEOUS
  Filled 2015-07-05: qty 1

## 2015-07-05 NOTE — Progress Notes (Signed)
Patient states that she has been feeling very weak and fatigued. She states that she can hardly "go". She also states that she gets SOB very easily. She thinks that the procrit is not helping her very much.

## 2015-07-06 ENCOUNTER — Inpatient Hospital Stay: Payer: Medicare Other

## 2015-07-06 ENCOUNTER — Ambulatory Visit: Payer: Medicare Other

## 2015-07-06 DIAGNOSIS — I129 Hypertensive chronic kidney disease with stage 1 through stage 4 chronic kidney disease, or unspecified chronic kidney disease: Secondary | ICD-10-CM | POA: Diagnosis not present

## 2015-07-06 DIAGNOSIS — D649 Anemia, unspecified: Secondary | ICD-10-CM

## 2015-07-06 LAB — TYPE AND SCREEN
ABO/RH(D): B POS
ANTIBODY SCREEN: NEGATIVE
Unit division: 0

## 2015-07-07 ENCOUNTER — Other Ambulatory Visit: Payer: Self-pay | Admitting: Internal Medicine

## 2015-07-07 ENCOUNTER — Inpatient Hospital Stay: Payer: Medicare Other

## 2015-07-07 VITALS — BP 185/68 | HR 78 | Temp 97.0°F | Resp 20

## 2015-07-07 DIAGNOSIS — I129 Hypertensive chronic kidney disease with stage 1 through stage 4 chronic kidney disease, or unspecified chronic kidney disease: Secondary | ICD-10-CM | POA: Diagnosis not present

## 2015-07-07 DIAGNOSIS — D649 Anemia, unspecified: Secondary | ICD-10-CM

## 2015-07-07 MED ORDER — DIPHENHYDRAMINE HCL 25 MG PO CAPS
25.0000 mg | ORAL_CAPSULE | Freq: Once | ORAL | Status: AC
  Administered 2015-07-07: 25 mg via ORAL
  Filled 2015-07-07: qty 1

## 2015-07-07 MED ORDER — HEPARIN SOD (PORK) LOCK FLUSH 100 UNIT/ML IV SOLN
500.0000 [IU] | Freq: Once | INTRAVENOUS | Status: AC
  Administered 2015-07-07: 500 [IU] via INTRAVENOUS

## 2015-07-07 MED ORDER — SODIUM CHLORIDE 0.9 % IV SOLN
INTRAVENOUS | Status: DC
Start: 2015-07-07 — End: 2015-07-07
  Administered 2015-07-07: 09:00:00 via INTRAVENOUS
  Filled 2015-07-07: qty 1000

## 2015-07-07 MED ORDER — HEPARIN SOD (PORK) LOCK FLUSH 100 UNIT/ML IV SOLN
INTRAVENOUS | Status: AC
  Filled 2015-07-07: qty 5

## 2015-07-07 MED ORDER — ACETAMINOPHEN 325 MG PO TABS
650.0000 mg | ORAL_TABLET | Freq: Once | ORAL | Status: AC
  Administered 2015-07-07: 650 mg via ORAL
  Filled 2015-07-07: qty 2

## 2015-07-09 LAB — TYPE AND SCREEN
ABO/RH(D): B POS
Antibody Screen: NEGATIVE
Unit division: 0

## 2015-07-10 ENCOUNTER — Inpatient Hospital Stay: Payer: Medicare Other

## 2015-07-10 VITALS — BP 162/72 | HR 75 | Resp 20

## 2015-07-10 DIAGNOSIS — D500 Iron deficiency anemia secondary to blood loss (chronic): Secondary | ICD-10-CM

## 2015-07-10 DIAGNOSIS — I129 Hypertensive chronic kidney disease with stage 1 through stage 4 chronic kidney disease, or unspecified chronic kidney disease: Secondary | ICD-10-CM | POA: Diagnosis not present

## 2015-07-10 MED ORDER — SODIUM CHLORIDE 0.9 % IV SOLN
INTRAVENOUS | Status: DC
  Administered 2015-07-10: 09:00:00 via INTRAVENOUS
  Filled 2015-07-10: qty 1000

## 2015-07-10 MED ORDER — HEPARIN SOD (PORK) LOCK FLUSH 100 UNIT/ML IV SOLN
INTRAVENOUS | Status: AC
  Filled 2015-07-10: qty 5

## 2015-07-10 MED ORDER — SODIUM CHLORIDE 0.9 % IV SOLN
500.0000 mg | Freq: Once | INTRAVENOUS | Status: AC
  Administered 2015-07-10: 500 mg via INTRAVENOUS
  Filled 2015-07-10: qty 25

## 2015-07-10 MED ORDER — HEPARIN SOD (PORK) LOCK FLUSH 100 UNIT/ML IV SOLN
500.0000 [IU] | Freq: Once | INTRAVENOUS | Status: AC
  Administered 2015-07-10: 500 [IU] via INTRAVENOUS

## 2015-07-10 NOTE — Progress Notes (Signed)
This encounter was created in error - please disregard.

## 2015-07-11 NOTE — Progress Notes (Signed)
[name]  Telephone:(336) [phone] Fax:(336) [phone]     ID: PRICELLA GAUGH OB: [birth year]  MR#: [id number]  JHE#:[id number]  Patient Care Team: Kirk Ruths, MD as PCP - General (Internal Medicine)  CHIEF COMPLAINT/DIAGNOSIS:  Anemia - secondary to chronic renal insufficiency and anemia of iron deficiency, h/o GI blood loss possible AV malformations.. Followed by Dr.Gittin until now - HX IDA IN THE PAST,  2. ANEMIA , BM BX DONE 1999, NORMAL, LATER IDA 02/2007, 3. MILD TROMBOCYTOPENIA    HIV AND HEP C NEG  4. LYMPHOPENIA  5. EGD AND COLONOSCOPY 02/2012 , .ANGIODYSPLASIA COLON, CAUTERY , RESECTION 9 SMALL POLYPS, 03/05/12   6 CONTINUED TRANSFUSION REQUIREMENT. NO EVIDENCE HEMOLYSIS OR OTHER BM DISORDER, REPEAT EPO LEVEL LOWER, RETICS HAVE SHOWN ACTIVE MARROW, ALL CONSISTENT WITH CONTINUED INTERMITTENT GI BLOOD LOSS.  CAMERA STUDY  NEW FINDINGS OF 2 MORE ANGIODYSPLASIA. 7. HOSPITALIZED [facility] 01/2013, 2 UNITS PRBC, EGD DONE.. May 28, evaluated by cardiology ,  angiogram cardiac stenting, placed on home oxygen. On june 4, thoracentesis.  Continue to follow for iron support and monitior hgb, transfuse prn, procrit prn..first dose procrit 9/9. 8. GIB 12/2014, EGD AND COLON.  HISTORY OF PRESENT ILLNESS:  Patient returns for continued hemaology followup. She had some improvement in anemia upto 9.3 g but again drifted back down to 8.6. She is feeling more fatigued, states she gets more dyspnea when Hb falls esp since she also has AS. She sometimes notices pinkish staining of water after BMs but denies BRBPR or clots. Denies other bleeding symptoms. Has chronic weakness and dyspnea on exertion. Denies orthopnea or PND. No paresthesias in extremities. No fevers.  REVIEW OF SYSTEMS:   ROS  As in HPI above. In addition, no new headaches or focal weakness.  No new sore throat, cough, hemoptysis or chest pain. No dizziness or palpitation. No abdominal pain, constipation, diarrhea, dysuria or  hematuria. No new skin rash or bleeding symptoms. No new paresthesias in extremities.    PAST MEDICAL HISTORY: Reviewed Past Medical History  Diagnosis Date  . Blood transfusion without reported diagnosis   . Diabetes mellitus without complication   . Hypertension   . Cancer     cervical    PAST SURGICAL HISTORY:Reviewed Past Surgical History  Procedure Laterality Date  . Abdominal hysterectomy      FAMILY HISTORY:Reviewed No family history on file.  SOCIAL HISTORY: Reviewed History  Substance Use Topics  . Smoking status: Former Research scientist (life sciences)  . Smokeless tobacco: Not on file  . Alcohol Use: No    Allergies  Allergen Reactions  . Atenolol Other (See Comments)    fatigue  . Darvon [Propoxyphene] Hives  . Hydrochlorothiazide Other (See Comments)    Fatigue and cramps  . Iodinated Diagnostic Agents Other (See Comments)    Questionable GI bleed  . Nylon Hives  . Other Other (See Comments)    Nylon sutures  . Pravastatin Other (See Comments)    Severe cramping    Current Outpatient Prescriptions  Medication Sig Dispense Refill  . CARTIA XT 240 MG 24 hr capsule Take 240 mg by mouth daily.    Marland Kitchen lisinopril (PRINIVIL,ZESTRIL) 40 MG tablet Take 40 mg by mouth daily.    . pantoprazole (PROTONIX) 20 MG tablet Take 20 mg by mouth daily.    . simvastatin (ZOCOR) 20 MG tablet Take 20 mg by mouth at bedtime.     . torsemide (DEMADEX) 20 MG tablet Take 20 mg by mouth daily.  No current facility-administered medications for this visit.    OBJECTIVE: Filed Vitals:   07/05/15 1104  BP: 151/80  Pulse: 76  Temp: 95.1 F (35.1 C)  Resp: 18     Body mass index is 32.15 kg/(m^2).       GENERAL: Patient is chronically weak looking, sitting in wheelchair, alert and oriented and in no acute distress. There is no icterus. Pallor present. HEENT: EOMs intact. No cervical lymphadenopathy. CVS: S1S2, regular, SM + LUNGS: Bilaterally clear to auscultation, no rhonchi. ABDOMEN: Soft,  nontender.   EXTREMITIES: No pedal edema.  LAB RESULTS: 7/13 -  WBC 5.7, Hb 8.4, plts 134.    Component Value Date/Time   NA 140 05/23/2015 1433   NA 139 01/18/2015 0545   K 5.1 05/23/2015 1433   K 3.9 01/18/2015 0545   CL 102 05/23/2015 1433   CL 105 01/18/2015 0545   CO2 28 05/23/2015 1433   CO2 25 01/18/2015 0545   GLUCOSE 195* 05/23/2015 1433   GLUCOSE 179* 01/18/2015 0545   BUN 28* 05/23/2015 1433   BUN 29* 01/18/2015 0545   CREATININE 1.58* 05/23/2015 1433   CREATININE 1.80* 01/18/2015 0545   CALCIUM 9.2 05/23/2015 1433   CALCIUM 8.7 01/18/2015 0545   PROT 7.2 05/23/2015 1433   PROT 6.6 06/20/2014 1101   ALBUMIN 3.5 05/23/2015 1433   ALBUMIN 3.1* 06/20/2014 1101   AST 24 05/23/2015 1433   AST 32 06/20/2014 1101   ALT 21 05/23/2015 1433   ALT 31 06/20/2014 1101   ALKPHOS 128* 05/23/2015 1433   ALKPHOS 125* 06/20/2014 1101   BILITOT 0.4 05/23/2015 1433   BILITOT 0.3 06/20/2014 1101   GFRNONAA 30* 05/23/2015 1433   GFRNONAA 28* 06/20/2014 1101   GFRAA 35* 05/23/2015 1433   GFRAA 33* 06/20/2014 1101     STUDIES:   ASSESSMENT / PLAN:   1.  Anemia - secondary to chronic renal insufficiency and anemia of iron deficiency, h/o GI blood loss possible AV malformations. Recent serum Creatinine 1.58 on 05/23/15, calculated Cr clearance 35-39 mL/min). Serum EPO level 29.9 on 07/27/14 - reviewed labs and d/w patient in detail. Given severe symptoms and underlying aortic stenosis, will transfuse 1 unit PRBC to try improve Hb and above symptoms. Will f/u iron study and if iron deficienct she will need IV iron therapy also. In the meantime, will increase Procrit to 20000 units SQ if Hb is < 10.  Patient is agreeable to this plan. Next MD f/u at 8 weeks with labs and make further plan of management. 2. Thrombocytopenia - mild. Prior w/u by Dr.Gittin did not reveal obvious etiology. Continue to monitor fo now. 3. In between visits, she was advised to call or come to ER in case of  progressive anemia symptoms or acute sickness. Patient is agreeable to this plan.    Morgan Alf, MD   07/11/2015 10:31 PM

## 2015-07-12 ENCOUNTER — Inpatient Hospital Stay: Payer: Medicare Other

## 2015-07-12 VITALS — BP 134/81 | HR 66 | Resp 20

## 2015-07-12 DIAGNOSIS — D631 Anemia in chronic kidney disease: Secondary | ICD-10-CM

## 2015-07-12 DIAGNOSIS — N189 Chronic kidney disease, unspecified: Principal | ICD-10-CM

## 2015-07-12 DIAGNOSIS — I129 Hypertensive chronic kidney disease with stage 1 through stage 4 chronic kidney disease, or unspecified chronic kidney disease: Secondary | ICD-10-CM | POA: Diagnosis not present

## 2015-07-12 DIAGNOSIS — D649 Anemia, unspecified: Secondary | ICD-10-CM

## 2015-07-12 LAB — SAMPLE TO BLOOD BANK

## 2015-07-12 LAB — HEMOGLOBIN: Hemoglobin: 9.7 g/dL — ABNORMAL LOW (ref 12.0–16.0)

## 2015-07-12 MED ORDER — EPOETIN ALFA 20000 UNIT/ML IJ SOLN
20000.0000 [IU] | Freq: Once | INTRAMUSCULAR | Status: AC
  Administered 2015-07-12: 20000 [IU] via SUBCUTANEOUS
  Filled 2015-07-12: qty 1

## 2015-07-17 LAB — FERRITIN: FERRITIN (ARMC): 139 ng/mL

## 2015-07-19 ENCOUNTER — Inpatient Hospital Stay: Payer: Medicare Other

## 2015-07-19 DIAGNOSIS — I129 Hypertensive chronic kidney disease with stage 1 through stage 4 chronic kidney disease, or unspecified chronic kidney disease: Secondary | ICD-10-CM | POA: Diagnosis not present

## 2015-07-19 DIAGNOSIS — D649 Anemia, unspecified: Secondary | ICD-10-CM

## 2015-07-19 LAB — HEMOGLOBIN: Hemoglobin: 10.5 g/dL — ABNORMAL LOW (ref 12.0–16.0)

## 2015-07-19 LAB — SAMPLE TO BLOOD BANK

## 2015-07-26 ENCOUNTER — Inpatient Hospital Stay: Payer: Medicare Other | Attending: Internal Medicine

## 2015-07-26 ENCOUNTER — Inpatient Hospital Stay: Payer: Medicare Other

## 2015-07-26 DIAGNOSIS — D696 Thrombocytopenia, unspecified: Secondary | ICD-10-CM | POA: Insufficient documentation

## 2015-07-26 DIAGNOSIS — E119 Type 2 diabetes mellitus without complications: Secondary | ICD-10-CM | POA: Insufficient documentation

## 2015-07-26 DIAGNOSIS — D509 Iron deficiency anemia, unspecified: Secondary | ICD-10-CM | POA: Insufficient documentation

## 2015-07-26 DIAGNOSIS — I350 Nonrheumatic aortic (valve) stenosis: Secondary | ICD-10-CM | POA: Insufficient documentation

## 2015-07-26 DIAGNOSIS — K5521 Angiodysplasia of colon with hemorrhage: Secondary | ICD-10-CM | POA: Insufficient documentation

## 2015-07-26 DIAGNOSIS — Q2733 Arteriovenous malformation of digestive system vessel: Secondary | ICD-10-CM | POA: Diagnosis not present

## 2015-07-26 DIAGNOSIS — D649 Anemia, unspecified: Secondary | ICD-10-CM

## 2015-07-26 DIAGNOSIS — D631 Anemia in chronic kidney disease: Secondary | ICD-10-CM | POA: Insufficient documentation

## 2015-07-26 DIAGNOSIS — Z79899 Other long term (current) drug therapy: Secondary | ICD-10-CM | POA: Diagnosis not present

## 2015-07-26 DIAGNOSIS — I129 Hypertensive chronic kidney disease with stage 1 through stage 4 chronic kidney disease, or unspecified chronic kidney disease: Secondary | ICD-10-CM | POA: Diagnosis present

## 2015-07-26 DIAGNOSIS — N189 Chronic kidney disease, unspecified: Secondary | ICD-10-CM | POA: Insufficient documentation

## 2015-07-26 DIAGNOSIS — Z86010 Personal history of colonic polyps: Secondary | ICD-10-CM | POA: Diagnosis not present

## 2015-07-26 DIAGNOSIS — Z87891 Personal history of nicotine dependence: Secondary | ICD-10-CM | POA: Diagnosis not present

## 2015-07-26 LAB — SAMPLE TO BLOOD BANK

## 2015-07-26 LAB — HEMOGLOBIN: Hemoglobin: 10.5 g/dL — ABNORMAL LOW (ref 12.0–16.0)

## 2015-07-26 NOTE — Progress Notes (Unsigned)
Patient's Hgb is 10.5. Reviewed with Dr. Beverly Gust nurse, Merrily Pew. In notes it states to give if hgb <10.

## 2015-08-02 ENCOUNTER — Inpatient Hospital Stay: Payer: Medicare Other

## 2015-08-02 VITALS — BP 154/82 | HR 85 | Temp 97.0°F | Resp 20

## 2015-08-02 DIAGNOSIS — D649 Anemia, unspecified: Secondary | ICD-10-CM

## 2015-08-02 DIAGNOSIS — N189 Chronic kidney disease, unspecified: Principal | ICD-10-CM

## 2015-08-02 DIAGNOSIS — D631 Anemia in chronic kidney disease: Secondary | ICD-10-CM

## 2015-08-02 DIAGNOSIS — I129 Hypertensive chronic kidney disease with stage 1 through stage 4 chronic kidney disease, or unspecified chronic kidney disease: Secondary | ICD-10-CM | POA: Diagnosis not present

## 2015-08-02 LAB — SAMPLE TO BLOOD BANK

## 2015-08-02 LAB — HEMOGLOBIN: Hemoglobin: 9.6 g/dL — ABNORMAL LOW (ref 12.0–16.0)

## 2015-08-02 MED ORDER — EPOETIN ALFA 20000 UNIT/ML IJ SOLN
20000.0000 [IU] | Freq: Once | INTRAMUSCULAR | Status: AC
  Administered 2015-08-02: 20000 [IU] via SUBCUTANEOUS
  Filled 2015-08-02: qty 1

## 2015-08-09 ENCOUNTER — Inpatient Hospital Stay: Payer: Medicare Other

## 2015-08-09 VITALS — BP 156/95 | HR 78 | Temp 97.1°F | Resp 20

## 2015-08-09 DIAGNOSIS — D631 Anemia in chronic kidney disease: Secondary | ICD-10-CM

## 2015-08-09 DIAGNOSIS — I129 Hypertensive chronic kidney disease with stage 1 through stage 4 chronic kidney disease, or unspecified chronic kidney disease: Secondary | ICD-10-CM | POA: Diagnosis not present

## 2015-08-09 DIAGNOSIS — D649 Anemia, unspecified: Secondary | ICD-10-CM

## 2015-08-09 DIAGNOSIS — N189 Chronic kidney disease, unspecified: Principal | ICD-10-CM

## 2015-08-09 LAB — FERRITIN: FERRITIN: 34 ng/mL (ref 11–307)

## 2015-08-09 LAB — SAMPLE TO BLOOD BANK

## 2015-08-09 LAB — IRON AND TIBC
Iron: 46 ug/dL (ref 28–170)
Saturation Ratios: 14 % (ref 10.4–31.8)
TIBC: 323 ug/dL (ref 250–450)
UIBC: 277 ug/dL

## 2015-08-09 LAB — HEMOGLOBIN: HEMOGLOBIN: 9.4 g/dL — AB (ref 12.0–16.0)

## 2015-08-09 MED ORDER — EPOETIN ALFA 20000 UNIT/ML IJ SOLN
20000.0000 [IU] | Freq: Once | INTRAMUSCULAR | Status: AC
  Administered 2015-08-09: 20000 [IU] via SUBCUTANEOUS
  Filled 2015-08-09: qty 1

## 2015-08-10 ENCOUNTER — Other Ambulatory Visit: Payer: Self-pay | Admitting: Internal Medicine

## 2015-08-10 ENCOUNTER — Telehealth: Payer: Self-pay | Admitting: *Deleted

## 2015-08-10 NOTE — Telephone Encounter (Signed)
Patient would like to speak with you regarding not getting iron infusion stating she always got iron if her level was <100.

## 2015-08-10 NOTE — Telephone Encounter (Signed)
Reviewed labs, iron study is in the normal range and do not see the need for IV iron at this time. Please keep scheduled appts and Procrit appts the same. Thanks.

## 2015-08-10 NOTE — Telephone Encounter (Signed)
Have d/w patient and explained that iron levels are in the low normal range. Patient is concerned that iron levels are low normal and had been getting maintenance iron in the past. Also states she was told by Dr.Fleming that part of her DOE is the anemia and she would like a small maintenance dose of IV iron. Please make appt for 15-minute IV Venofer Rx tomorrow (08/11/15) and notify patient. I will enter the orders.

## 2015-08-10 NOTE — Telephone Encounter (Signed)
Asking for a call back to get her result of her Ferritin level. Results are 34 hgb 9.4 Will she need any tx?

## 2015-08-11 ENCOUNTER — Inpatient Hospital Stay: Payer: Medicare Other

## 2015-08-11 VITALS — BP 189/75 | HR 81 | Temp 96.4°F | Resp 20

## 2015-08-11 DIAGNOSIS — I129 Hypertensive chronic kidney disease with stage 1 through stage 4 chronic kidney disease, or unspecified chronic kidney disease: Secondary | ICD-10-CM | POA: Diagnosis not present

## 2015-08-11 DIAGNOSIS — D500 Iron deficiency anemia secondary to blood loss (chronic): Secondary | ICD-10-CM

## 2015-08-11 MED ORDER — SODIUM CHLORIDE 0.9 % IJ SOLN
10.0000 mL | Freq: Once | INTRAMUSCULAR | Status: AC
Start: 2015-08-11 — End: 2015-08-11
  Administered 2015-08-11: 10 mL via INTRAVENOUS
  Filled 2015-08-11: qty 10

## 2015-08-11 MED ORDER — SODIUM CHLORIDE 0.9 % IV SOLN
INTRAVENOUS | Status: DC
  Administered 2015-08-11: 14:00:00 via INTRAVENOUS
  Filled 2015-08-11: qty 1000

## 2015-08-11 MED ORDER — HEPARIN SOD (PORK) LOCK FLUSH 100 UNIT/ML IV SOLN
500.0000 [IU] | Freq: Once | INTRAVENOUS | Status: AC
  Administered 2015-08-11: 500 [IU] via INTRAVENOUS
  Filled 2015-08-11: qty 5

## 2015-08-11 MED ORDER — SODIUM CHLORIDE 0.9 % IV SOLN
100.0000 mg | Freq: Once | INTRAVENOUS | Status: AC
  Administered 2015-08-11: 100 mg via INTRAVENOUS
  Filled 2015-08-11: qty 5

## 2015-08-16 ENCOUNTER — Inpatient Hospital Stay: Payer: Medicare Other

## 2015-08-16 DIAGNOSIS — D649 Anemia, unspecified: Secondary | ICD-10-CM

## 2015-08-16 DIAGNOSIS — I129 Hypertensive chronic kidney disease with stage 1 through stage 4 chronic kidney disease, or unspecified chronic kidney disease: Secondary | ICD-10-CM | POA: Diagnosis not present

## 2015-08-16 DIAGNOSIS — D631 Anemia in chronic kidney disease: Secondary | ICD-10-CM

## 2015-08-16 DIAGNOSIS — N189 Chronic kidney disease, unspecified: Principal | ICD-10-CM

## 2015-08-16 LAB — SAMPLE TO BLOOD BANK

## 2015-08-16 LAB — HEMOGLOBIN: Hemoglobin: 10.1 g/dL — ABNORMAL LOW (ref 12.0–16.0)

## 2015-08-16 MED ORDER — EPOETIN ALFA 20000 UNIT/ML IJ SOLN: 20000.0000 [IU] | Freq: Once | INTRAMUSCULAR | Status: DC

## 2015-08-23 ENCOUNTER — Inpatient Hospital Stay: Payer: Medicare Other

## 2015-08-23 VITALS — BP 152/75 | HR 70 | Temp 96.6°F

## 2015-08-23 DIAGNOSIS — D631 Anemia in chronic kidney disease: Secondary | ICD-10-CM

## 2015-08-23 DIAGNOSIS — N189 Chronic kidney disease, unspecified: Principal | ICD-10-CM

## 2015-08-23 DIAGNOSIS — I129 Hypertensive chronic kidney disease with stage 1 through stage 4 chronic kidney disease, or unspecified chronic kidney disease: Secondary | ICD-10-CM | POA: Diagnosis not present

## 2015-08-23 DIAGNOSIS — D649 Anemia, unspecified: Secondary | ICD-10-CM

## 2015-08-23 LAB — SAMPLE TO BLOOD BANK

## 2015-08-23 LAB — HEMOGLOBIN: HEMOGLOBIN: 9.6 g/dL — AB (ref 12.0–16.0)

## 2015-08-23 MED ORDER — EPOETIN ALFA 20000 UNIT/ML IJ SOLN
20000.0000 [IU] | Freq: Once | INTRAMUSCULAR | Status: AC
  Administered 2015-08-23: 20000 [IU] via SUBCUTANEOUS
  Filled 2015-08-23: qty 1

## 2015-08-30 ENCOUNTER — Inpatient Hospital Stay (HOSPITAL_BASED_OUTPATIENT_CLINIC_OR_DEPARTMENT_OTHER): Payer: Medicare Other | Admitting: Internal Medicine

## 2015-08-30 ENCOUNTER — Inpatient Hospital Stay: Payer: Medicare Other

## 2015-08-30 ENCOUNTER — Inpatient Hospital Stay: Payer: Medicare Other | Attending: Internal Medicine

## 2015-08-30 VITALS — BP 146/80 | HR 81 | Temp 98.4°F | Resp 18 | Ht 64.0 in | Wt 190.3 lb

## 2015-08-30 DIAGNOSIS — D696 Thrombocytopenia, unspecified: Secondary | ICD-10-CM | POA: Insufficient documentation

## 2015-08-30 DIAGNOSIS — Z79899 Other long term (current) drug therapy: Secondary | ICD-10-CM | POA: Diagnosis not present

## 2015-08-30 DIAGNOSIS — I129 Hypertensive chronic kidney disease with stage 1 through stage 4 chronic kidney disease, or unspecified chronic kidney disease: Secondary | ICD-10-CM | POA: Diagnosis not present

## 2015-08-30 DIAGNOSIS — I350 Nonrheumatic aortic (valve) stenosis: Secondary | ICD-10-CM | POA: Diagnosis not present

## 2015-08-30 DIAGNOSIS — D509 Iron deficiency anemia, unspecified: Secondary | ICD-10-CM

## 2015-08-30 DIAGNOSIS — D631 Anemia in chronic kidney disease: Secondary | ICD-10-CM

## 2015-08-30 DIAGNOSIS — N189 Chronic kidney disease, unspecified: Secondary | ICD-10-CM | POA: Diagnosis not present

## 2015-08-30 DIAGNOSIS — E119 Type 2 diabetes mellitus without complications: Secondary | ICD-10-CM | POA: Insufficient documentation

## 2015-08-30 DIAGNOSIS — D649 Anemia, unspecified: Secondary | ICD-10-CM

## 2015-08-30 DIAGNOSIS — Z87891 Personal history of nicotine dependence: Secondary | ICD-10-CM

## 2015-08-30 DIAGNOSIS — D500 Iron deficiency anemia secondary to blood loss (chronic): Secondary | ICD-10-CM

## 2015-08-30 LAB — CBC WITH DIFFERENTIAL/PLATELET
Basophils Absolute: 0.1 10*3/uL (ref 0–0.1)
Basophils Relative: 1 %
EOS PCT: 2 %
Eosinophils Absolute: 0.1 10*3/uL (ref 0–0.7)
HCT: 26.9 % — ABNORMAL LOW (ref 35.0–47.0)
Hemoglobin: 8.9 g/dL — ABNORMAL LOW (ref 12.0–16.0)
LYMPHS ABS: 0.4 10*3/uL — AB (ref 1.0–3.6)
LYMPHS PCT: 8 %
MCH: 29.6 pg (ref 26.0–34.0)
MCHC: 33 g/dL (ref 32.0–36.0)
MCV: 89.5 fL (ref 80.0–100.0)
MONO ABS: 0.4 10*3/uL (ref 0.2–0.9)
Monocytes Relative: 7 %
Neutro Abs: 4.5 10*3/uL (ref 1.4–6.5)
Neutrophils Relative %: 82 %
PLATELETS: 118 10*3/uL — AB (ref 150–440)
RBC: 3.01 MIL/uL — AB (ref 3.80–5.20)
RDW: 16.9 % — ABNORMAL HIGH (ref 11.5–14.5)
WBC: 5.5 10*3/uL (ref 3.6–11.0)

## 2015-08-30 MED ORDER — EPOETIN ALFA 20000 UNIT/ML IJ SOLN
20000.0000 [IU] | Freq: Once | INTRAMUSCULAR | Status: AC
  Administered 2015-08-30: 20000 [IU] via SUBCUTANEOUS
  Filled 2015-08-30: qty 1

## 2015-08-30 NOTE — Progress Notes (Signed)
Patient states that she feels like her Hgb is going to be down today. She states that she has started having SOB again and has been weak and fatigued.

## 2015-09-06 ENCOUNTER — Other Ambulatory Visit: Payer: Self-pay | Admitting: Internal Medicine

## 2015-09-06 ENCOUNTER — Other Ambulatory Visit: Payer: Self-pay

## 2015-09-06 ENCOUNTER — Inpatient Hospital Stay: Payer: Medicare Other

## 2015-09-06 VITALS — BP 139/73 | HR 85 | Resp 20

## 2015-09-06 DIAGNOSIS — D500 Iron deficiency anemia secondary to blood loss (chronic): Secondary | ICD-10-CM

## 2015-09-06 DIAGNOSIS — I129 Hypertensive chronic kidney disease with stage 1 through stage 4 chronic kidney disease, or unspecified chronic kidney disease: Secondary | ICD-10-CM | POA: Diagnosis not present

## 2015-09-06 DIAGNOSIS — D649 Anemia, unspecified: Secondary | ICD-10-CM

## 2015-09-06 LAB — IRON AND TIBC
Iron: 55 ug/dL (ref 28–170)
Saturation Ratios: 15 % (ref 10.4–31.8)
TIBC: 366 ug/dL (ref 250–450)
UIBC: 311 ug/dL

## 2015-09-06 LAB — PREPARE RBC (CROSSMATCH)

## 2015-09-06 LAB — HEMOGLOBIN: Hemoglobin: 7.5 g/dL — ABNORMAL LOW (ref 12.0–16.0)

## 2015-09-06 LAB — FERRITIN: Ferritin: 21 ng/mL (ref 11–307)

## 2015-09-06 LAB — SAMPLE TO BLOOD BANK

## 2015-09-06 MED ORDER — EPOETIN ALFA 20000 UNIT/ML IJ SOLN
20000.0000 [IU] | Freq: Once | INTRAMUSCULAR | Status: AC
  Administered 2015-09-06: 20000 [IU] via SUBCUTANEOUS
  Filled 2015-09-06: qty 1

## 2015-09-06 NOTE — Addendum Note (Signed)
Addended by: Lorrine Kin A on: 09/06/2015 03:41 PM   Modules accepted: Orders

## 2015-09-06 NOTE — Addendum Note (Signed)
Addended by: Lorrine Kin A on: 09/06/2015 04:23 PM   Modules accepted: Orders

## 2015-09-07 ENCOUNTER — Inpatient Hospital Stay: Payer: Medicare Other

## 2015-09-07 VITALS — BP 145/90 | HR 78 | Temp 97.2°F | Resp 20

## 2015-09-07 DIAGNOSIS — I129 Hypertensive chronic kidney disease with stage 1 through stage 4 chronic kidney disease, or unspecified chronic kidney disease: Secondary | ICD-10-CM | POA: Diagnosis not present

## 2015-09-07 DIAGNOSIS — D500 Iron deficiency anemia secondary to blood loss (chronic): Secondary | ICD-10-CM

## 2015-09-07 MED ORDER — HEPARIN SOD (PORK) LOCK FLUSH 100 UNIT/ML IV SOLN
500.0000 [IU] | Freq: Every day | INTRAVENOUS | Status: AC | PRN
  Administered 2015-09-07: 500 [IU]
  Filled 2015-09-07: qty 5

## 2015-09-07 MED ORDER — ACETAMINOPHEN 325 MG PO TABS
650.0000 mg | ORAL_TABLET | Freq: Once | ORAL | Status: AC
  Administered 2015-09-07: 650 mg via ORAL
  Filled 2015-09-07: qty 2

## 2015-09-07 MED ORDER — SODIUM CHLORIDE 0.9 % IV SOLN
250.0000 mL | Freq: Once | INTRAVENOUS | Status: AC
  Administered 2015-09-07: 250 mL via INTRAVENOUS
  Filled 2015-09-07: qty 250

## 2015-09-07 MED ORDER — DIPHENHYDRAMINE HCL 25 MG PO CAPS
25.0000 mg | ORAL_CAPSULE | Freq: Once | ORAL | Status: AC
  Administered 2015-09-07: 25 mg via ORAL
  Filled 2015-09-07: qty 1

## 2015-09-07 MED ORDER — SODIUM CHLORIDE 0.9 % IJ SOLN
10.0000 mL | INTRAMUSCULAR | Status: AC | PRN
  Administered 2015-09-07: 10 mL
  Filled 2015-09-07: qty 10

## 2015-09-08 LAB — TYPE AND SCREEN
ABO/RH(D): B POS
ANTIBODY SCREEN: NEGATIVE
UNIT DIVISION: 0
UNIT DIVISION: 0
Unit division: 0
Unit division: 0

## 2015-09-13 ENCOUNTER — Inpatient Hospital Stay: Payer: Medicare Other

## 2015-09-13 VITALS — BP 143/79 | HR 71 | Temp 97.0°F | Resp 20

## 2015-09-13 DIAGNOSIS — I129 Hypertensive chronic kidney disease with stage 1 through stage 4 chronic kidney disease, or unspecified chronic kidney disease: Secondary | ICD-10-CM | POA: Diagnosis not present

## 2015-09-13 DIAGNOSIS — D500 Iron deficiency anemia secondary to blood loss (chronic): Secondary | ICD-10-CM

## 2015-09-13 LAB — SAMPLE TO BLOOD BANK

## 2015-09-13 LAB — HEMOGLOBIN: HEMOGLOBIN: 9.4 g/dL — AB (ref 12.0–16.0)

## 2015-09-13 MED ORDER — EPOETIN ALFA 20000 UNIT/ML IJ SOLN
20000.0000 [IU] | Freq: Once | INTRAMUSCULAR | Status: AC
  Administered 2015-09-13: 20000 [IU] via SUBCUTANEOUS
  Filled 2015-09-13: qty 1

## 2015-09-14 NOTE — Progress Notes (Signed)
[facility]  Telephone:(336) [phone] Fax:(336) [phone]     ID: SHALIN LINDERS OB: 11/26/1935  MR#: [id number]  EVO#:[id number]  Patient Care Team: Kirk Ruths, MD as PCP - General (Internal Medicine)  CHIEF COMPLAINT/DIAGNOSIS:  Anemia - secondary to chronic renal insufficiency and anemia of iron deficiency, h/o GI blood loss possible AV malformations.. Followed by Dr.Gittin until now - HX IDA IN THE PAST,  2. ANEMIA , BM BX DONE 1999, NORMAL, LATER IDA 02/2007, 3. MILD TROMBOCYTOPENIA    HIV AND HEP C NEG  4. LYMPHOPENIA  5. EGD AND COLONOSCOPY 02/2012 , .ANGIODYSPLASIA COLON, CAUTERY , RESECTION 9 SMALL POLYPS, 03/05/12   6 CONTINUED TRANSFUSION REQUIREMENT. NO EVIDENCE HEMOLYSIS OR OTHER BM DISORDER, REPEAT EPO LEVEL LOWER, RETICS HAVE SHOWN ACTIVE MARROW, ALL CONSISTENT WITH CONTINUED INTERMITTENT GI BLOOD LOSS.  CAMERA STUDY  NEW FINDINGS OF 2 MORE ANGIODYSPLASIA. 7. HOSPITALIZED [facility] 01/2013, 2 UNITS PRBC, EGD DONE.. May 28, evaluated by cardiology ,  angiogram cardiac stenting, placed on home oxygen. On june 4, thoracentesis.  Continue to follow for iron support and monitior hgb, transfuse prn, procrit prn..first dose procrit 9/9. 8. GIB 12/2014, EGD AND COLON.  HISTORY OF PRESENT ILLNESS:  Patient returns for continued hemaology followup. She is on procrit, Hb lately has been better, it was 9.4 on 8/17, 10.1 on 8/24, but slightly lower at 8.9 today. States her fatigue and dyspnea is more than usual this week, she also has Aortic Stenosis. She sometimes notices pinkish staining of water after BMs but denies BRBPR or clots. Denies other bleeding symptoms. Has chronic weakness and dyspnea on exertion. Denies orthopnea or PND. No paresthesias in extremities.    REVIEW OF SYSTEMS:   ROS As in HPI above. In addition, no fevers. No new headaches or focal weakness.  No new sore throat, cough, hemoptysis or chest pain. No dizziness or palpitation. No abdominal pain, constipation,  diarrhea, dysuria or hematuria. No new skin rash. No polyuria polydipsia.     PAST MEDICAL HISTORY: Reviewed Past Medical History  Diagnosis Date  . Blood transfusion without reported diagnosis   . Diabetes mellitus without complication   . Hypertension   . Cancer     cervical    PAST SURGICAL HISTORY:Reviewed Past Surgical History  Procedure Laterality Date  . Abdominal hysterectomy      FAMILY HISTORY:Reviewed Noncontributory.   SOCIAL HISTORY: Reviewed Social History  Substance Use Topics  . Smoking status: Former Research scientist (life sciences)  . Smokeless tobacco: Not on file  . Alcohol Use: No    Allergies  Allergen Reactions  . Atenolol Other (See Comments)    fatigue  . Darvon [Propoxyphene] Hives  . Hydrochlorothiazide Other (See Comments)    Fatigue and cramps  . Iodinated Diagnostic Agents Other (See Comments)    Questionable GI bleed  . Nylon Hives  . Other Other (See Comments)    Nylon sutures  . Pravastatin Other (See Comments)    Severe cramping    Current Outpatient Prescriptions  Medication Sig Dispense Refill  . CARTIA XT 240 MG 24 hr capsule Take 240 mg by mouth daily.    Marland Kitchen lisinopril (PRINIVIL,ZESTRIL) 40 MG tablet Take 40 mg by mouth daily.    . pantoprazole (PROTONIX) 20 MG tablet Take 20 mg by mouth daily.    . simvastatin (ZOCOR) 20 MG tablet Take 20 mg by mouth at bedtime.     . torsemide (DEMADEX) 20 MG tablet Take 20 mg by mouth daily.  No current facility-administered medications for this visit.    OBJECTIVE: Filed Vitals:   08/30/15 1041  BP: 146/80  Pulse: 81  Temp: 98.4 F (36.9 C)  Resp: 18     Body mass index is 32.64 kg/(m^2).       GENERAL: Chronically weak looking, sitting in wheelchair, alert and oriented and in no acute distress. No icterus. Pallor present. HEENT: EOMs intact. No oral thrush. CVS: S1S2, regular, SM + LUNGS: Bilaterally clear to auscultation, no creps or rhonchi. ABDOMEN: Soft, nontender.   EXTREMITIES: No pedal  edema.  LAB RESULTS: WBC 5.5, Hb 8.9, plts 118.    Component Value Date/Time   NA 140 05/23/2015 1433   NA 139 01/18/2015 0545   K 5.1 05/23/2015 1433   K 3.9 01/18/2015 0545   CL 102 05/23/2015 1433   CL 105 01/18/2015 0545   CO2 28 05/23/2015 1433   CO2 25 01/18/2015 0545   GLUCOSE 195* 05/23/2015 1433   GLUCOSE 179* 01/18/2015 0545   BUN 28* 05/23/2015 1433   BUN 29* 01/18/2015 0545   CREATININE 1.58* 05/23/2015 1433   CREATININE 1.80* 01/18/2015 0545   CALCIUM 9.2 05/23/2015 1433   CALCIUM 8.7 01/18/2015 0545   PROT 7.2 05/23/2015 1433   PROT 6.6 06/20/2014 1101   ALBUMIN 3.5 05/23/2015 1433   ALBUMIN 3.1* 06/20/2014 1101   AST 24 05/23/2015 1433   AST 32 06/20/2014 1101   ALT 21 05/23/2015 1433   ALT 31 06/20/2014 1101   ALKPHOS 128* 05/23/2015 1433   ALKPHOS 125* 06/20/2014 1101   BILITOT 0.4 05/23/2015 1433   BILITOT 0.3 06/20/2014 1101   GFRNONAA 30* 05/23/2015 1433   GFRNONAA 29* 01/18/2015 0545   GFRNONAA 28* 06/20/2014 1101   GFRAA 35* 05/23/2015 1433   GFRAA 35* 01/18/2015 0545   GFRAA 33* 06/20/2014 1101   08/09/15 - ferritin 34, serum Fe 46, iron sat 14%, TIBC 323.   ASSESSMENT / PLAN:   1.  Anemia - secondary to chronic renal insufficiency and anemia of iron deficiency, h/o GI blood loss possible AV malformations. Recent serum Creatinine 1.58 on 05/23/15, calculated Cr clearance 35-39 mL/min). Serum EPO level 29.9 on 07/27/14 - reviewed labs and d/w patient in detail. Hb seems to be fluctuating in a better range, slightly lower at 8.9 g today. Given severe symptoms and underlying aortic stenosis, will transfuse if Hb drops lower or if she has worsening symptoms. Recent iron study on 8/17 unremarkable, continue to monitor intermittently and puruse IV iron therapy as indicated. She does not want to increase Procrit dose, will continue on 20000 units SQ if Hb is < 10. Patient is agreeable to this plan. Next MD f/u at 8 weeks with labs and make further plan of  management. 2. Thrombocytopenia - mild. Prior w/u by Dr.Gittin did not reveal obvious etiology. Continue to monitor. 3. In between visits, she was advised to call or come to ER in case of progressive anemia symptoms or acute sickness. Patient is agreeable to this plan.    Leia Alf, MD   09/14/2015 9:30 AM

## 2015-09-15 ENCOUNTER — Other Ambulatory Visit: Payer: Self-pay | Admitting: Physician Assistant

## 2015-09-15 ENCOUNTER — Ambulatory Visit
Admission: RE | Admit: 2015-09-15 | Discharge: 2015-09-15 | Disposition: A | Discharge status: Home / Self Care | Payer: Medicare Other | Source: Ambulatory Visit | Attending: Physician Assistant | Admitting: Physician Assistant

## 2015-09-15 DIAGNOSIS — R600 Localized edema: Secondary | ICD-10-CM | POA: Diagnosis present

## 2015-09-20 ENCOUNTER — Inpatient Hospital Stay: Payer: Medicare Other

## 2015-09-20 ENCOUNTER — Encounter: Payer: Self-pay | Admitting: *Deleted

## 2015-09-20 VITALS — BP 150/84 | HR 75 | Resp 20

## 2015-09-20 DIAGNOSIS — I129 Hypertensive chronic kidney disease with stage 1 through stage 4 chronic kidney disease, or unspecified chronic kidney disease: Secondary | ICD-10-CM | POA: Diagnosis not present

## 2015-09-20 DIAGNOSIS — D500 Iron deficiency anemia secondary to blood loss (chronic): Secondary | ICD-10-CM

## 2015-09-20 LAB — HEMOGLOBIN: HEMOGLOBIN: 9.4 g/dL — AB (ref 12.0–16.0)

## 2015-09-20 LAB — SAMPLE TO BLOOD BANK

## 2015-09-20 MED ORDER — EPOETIN ALFA 20000 UNIT/ML IJ SOLN
20000.0000 [IU] | Freq: Once | INTRAMUSCULAR | Status: AC
  Administered 2015-09-20: 20000 [IU] via SUBCUTANEOUS
  Filled 2015-09-20: qty 1

## 2015-09-20 NOTE — Progress Notes (Signed)
Patient came for lab only/procrit today; she requested to see a nurse to discuss her concerns about her symptoms of GI distress and hx of anemia. She has a complicated history of anemia r/t to GI blood loss/AV malformation, multiple medical complications.   Today, she c/o Gi distress and frequent episodes of diarrhea with some noticeable bright red blood in stools. She has had 4-5 loose stools per day. She personally attributes the episodes of diarrhea to her "procrit injection as this is listed as possible side effect of procrit." she states "I just do not think my procrit is working and I don't know how much longer I agree to keep taking the procrit injections." "I want a new blood doctor to manage my symptoms and a second opinion on the treatment. I would like to transfer my services to Dr. Rogue [name]."  Her hgb was stable today at 9.4 and she agreed to receive her weekly procrit injections of 20,000 units. He has received blood transfusions in the past; however this is not needed today.  She also states that she is currently being treatment for left leg cellulitis by Dr. Ouida Sills, who prescribed doxcycline. She has an appointment to see Dr. Ouida Sills today at 1pm. I encouraged the patient to keep this appointment for further evaluation of the left leg cellulitis.  Patient requesting that Dr. Rogue [name] tx her cellulitis. RN explained that Dr. Ouida Sills needs to tx the cellulitis.  On RN evaluation, this cellulitis extends from her left ankle to mid-shin; edema is present; leg is hot to touch. The patient states that the cellulitis is "getting worse and her legs are more swollen than yesterday."  I explained that the antibiotics can also cause GI distress and diarrhea. However, she had had GI distress can occur for a variety of reasons including infections. We discussed that her GI distress and GI bleed had been an ongoing concern.  She has not seen Dr. Vira Agar at [place] since last December 2015. However, she  has continued to decline a colonoscopy or further GI workup as she "doesn't want anyone poking around my colon." She stated this morning that she is "still notices blood in her stool"; however, "she doesn't want to go back to GI as she is concerned it will cause more problems and complications if a colonoscopy is performed."  RN spoke to Dr. Rogue [name] about patient concerns. MD is aware of patient's concerns. He is agreeable to see the patient next week at the time of her appointment for the procrit injection to establish care.  Scheduling will call the patient with an appointment with Dr. Rogue [name].

## 2015-09-26 ENCOUNTER — Other Ambulatory Visit: Payer: Self-pay | Admitting: *Deleted

## 2015-09-26 ENCOUNTER — Encounter: Payer: Self-pay | Admitting: *Deleted

## 2015-09-27 ENCOUNTER — Inpatient Hospital Stay: Payer: Medicare Other | Attending: Internal Medicine

## 2015-09-27 ENCOUNTER — Inpatient Hospital Stay (HOSPITAL_BASED_OUTPATIENT_CLINIC_OR_DEPARTMENT_OTHER): Payer: Medicare Other | Admitting: Internal Medicine

## 2015-09-27 ENCOUNTER — Encounter: Payer: Self-pay | Admitting: Internal Medicine

## 2015-09-27 ENCOUNTER — Telehealth: Payer: Self-pay | Admitting: Pharmacist

## 2015-09-27 ENCOUNTER — Inpatient Hospital Stay: Payer: Medicare Other

## 2015-09-27 VITALS — BP 181/72 | HR 77 | Temp 97.0°F | Ht 63.0 in | Wt 190.0 lb

## 2015-09-27 VITALS — BP 130/79 | HR 82

## 2015-09-27 DIAGNOSIS — I739 Peripheral vascular disease, unspecified: Secondary | ICD-10-CM | POA: Diagnosis not present

## 2015-09-27 DIAGNOSIS — E1122 Type 2 diabetes mellitus with diabetic chronic kidney disease: Secondary | ICD-10-CM | POA: Insufficient documentation

## 2015-09-27 DIAGNOSIS — D631 Anemia in chronic kidney disease: Secondary | ICD-10-CM | POA: Insufficient documentation

## 2015-09-27 DIAGNOSIS — I129 Hypertensive chronic kidney disease with stage 1 through stage 4 chronic kidney disease, or unspecified chronic kidney disease: Secondary | ICD-10-CM | POA: Diagnosis not present

## 2015-09-27 DIAGNOSIS — R197 Diarrhea, unspecified: Secondary | ICD-10-CM

## 2015-09-27 DIAGNOSIS — M109 Gout, unspecified: Secondary | ICD-10-CM | POA: Insufficient documentation

## 2015-09-27 DIAGNOSIS — D500 Iron deficiency anemia secondary to blood loss (chronic): Secondary | ICD-10-CM

## 2015-09-27 DIAGNOSIS — N183 Chronic kidney disease, stage 3 (moderate): Secondary | ICD-10-CM | POA: Diagnosis not present

## 2015-09-27 DIAGNOSIS — D509 Iron deficiency anemia, unspecified: Secondary | ICD-10-CM | POA: Diagnosis not present

## 2015-09-27 DIAGNOSIS — L03116 Cellulitis of left lower limb: Secondary | ICD-10-CM

## 2015-09-27 DIAGNOSIS — D696 Thrombocytopenia, unspecified: Secondary | ICD-10-CM | POA: Diagnosis not present

## 2015-09-27 DIAGNOSIS — I2510 Atherosclerotic heart disease of native coronary artery without angina pectoris: Secondary | ICD-10-CM | POA: Diagnosis not present

## 2015-09-27 DIAGNOSIS — J449 Chronic obstructive pulmonary disease, unspecified: Secondary | ICD-10-CM | POA: Diagnosis not present

## 2015-09-27 DIAGNOSIS — Z79899 Other long term (current) drug therapy: Secondary | ICD-10-CM | POA: Diagnosis not present

## 2015-09-27 DIAGNOSIS — I509 Heart failure, unspecified: Secondary | ICD-10-CM | POA: Diagnosis not present

## 2015-09-27 DIAGNOSIS — K589 Irritable bowel syndrome without diarrhea: Secondary | ICD-10-CM | POA: Diagnosis not present

## 2015-09-27 DIAGNOSIS — Z87891 Personal history of nicotine dependence: Secondary | ICD-10-CM | POA: Diagnosis not present

## 2015-09-27 DIAGNOSIS — N189 Chronic kidney disease, unspecified: Secondary | ICD-10-CM

## 2015-09-27 DIAGNOSIS — E785 Hyperlipidemia, unspecified: Secondary | ICD-10-CM | POA: Insufficient documentation

## 2015-09-27 DIAGNOSIS — Z8541 Personal history of malignant neoplasm of cervix uteri: Secondary | ICD-10-CM | POA: Diagnosis not present

## 2015-09-27 DIAGNOSIS — I429 Cardiomyopathy, unspecified: Secondary | ICD-10-CM | POA: Insufficient documentation

## 2015-09-27 DIAGNOSIS — K219 Gastro-esophageal reflux disease without esophagitis: Secondary | ICD-10-CM | POA: Insufficient documentation

## 2015-09-27 LAB — IRON AND TIBC
IRON: 41 ug/dL (ref 28–170)
SATURATION RATIOS: 11 % (ref 10.4–31.8)
TIBC: 361 ug/dL (ref 250–450)
UIBC: 321 ug/dL

## 2015-09-27 LAB — SAMPLE TO BLOOD BANK

## 2015-09-27 LAB — HEMOGLOBIN: Hemoglobin: 9.6 g/dL — ABNORMAL LOW (ref 12.0–16.0)

## 2015-09-27 LAB — FERRITIN: FERRITIN: 20 ng/mL (ref 11–307)

## 2015-09-27 MED ORDER — EPOETIN ALFA 20000 UNIT/ML IJ SOLN
20000.0000 [IU] | Freq: Once | INTRAMUSCULAR | Status: DC
  Filled 2015-09-27: qty 1

## 2015-09-27 NOTE — Patient Instructions (Signed)
Clostridium Difficile Infection Clostridium difficile (C. difficile or C. diff) is a germ found in the intestines. C. difficile infection can happen after taking antibiotic medicines. Antiobiotics may cause the C. difficile to grow out of control and make a toxin that causes the infection. C. difficile infection can cause watery poop (diarrhea) or severe disease. HOME CARE  Drink enough fluids to keep your pee (urine) clear or pale yellow. Avoid milk, caffeine, and alcohol.  Ask your doctor how to replace the body fluids you lost (rehydrate).  Eat small meals often. Avoid eating large meals.  Take your antibiotics as told. Finish them even if you start to feel better.  Do not  take medicines that help watery poop. These medicines may stop you from healing as fast or cause problems.  Wash your hands after using the bathroom and before touching food. Make sure people who live with you wash their hands often.  Clean all surfaces in your home. Use a product that has chlorine bleach in it. GET HELP IF:  Your watery poop lasts longer than expected.  Your watery poop comes back after you finish your antibiotic medicine.  You feel very dry or thirsty (dehydrated).  You have a fever. GET HELP RIGHT AWAY IF:   You have more stomach pain or tenderness.  You have blood in your poop (stool). Your poop may look black and tar-like.  You cannot eat or drink without throwing up (vomiting).   This information is not intended to replace advice given to you by your health care provider. Make sure you discuss any questions you have with your health care provider.   Document Released: 10/06/2009 Document Revised: 12/30/2014 Document Reviewed: 06/12/2015 Elsevier Interactive Patient Education [employer].

## 2015-09-27 NOTE — Progress Notes (Signed)
Patient here for follow up. Multiple complaints  today cellulitis of left leg seeing PCP for treatment, edema of left leg, shortness of breath with ambulation, restless leg syndrome causing sleep issues.

## 2015-09-27 NOTE — Progress Notes (Signed)
[facility] OFFICE PROGRESS NOTE  Patient Care Team: Kirk Ruths, MD as PCP - General (Internal Medicine) Cammie Sickle, MD as Consulting Physician (Hematology and Oncology)   SUMMARY OF HEMATOLOGIC HISTORY:  # CHRONIC ANEMIA sec Hx GIB/CKD [ CKD STAGE III]; Jan-2016 [colo/EGD- Dr.Elliot]; capsule x2 - angiodysplasia; BMBx- 1999  # CHRONIC CELLULITIS/ AORTIC STENOSIS/ LIMITED MOBILITY   INTERVAL HISTORY:  This is my first interaction with the patient since I joined the practice September 2016. I reviewed the patient's prior chart/pertinent labs/imaging in detail; findings are summarized.  80 year old female patient with multiple medical problems and above history of chronic anemia from chronic GI blood loss/chronic kidney disease on iron and Procrit is here for follow-up.  In the interim patient had been diagnosed with recurrent cellulitis of the left lower extremity; she is currently on Levaquin. She States to have diarrhea "profuse"every time she gets "Procrit". As per the patient this has been complicated by the recent antibiotics. Patient is reluctant to get Procrit today.  Patient also admits to intermittent blood in stools; last approximately 3 weeks ago. Patient had multiple colonoscopies/EGDs in the past; most recent in January 2016.    REVIEW OF SYSTEMS:  patient feels tired; this is chronic she has chronic shortness of breath. Chronic leg swellings/slightly improved since starting on antibiotics recently. A complete 10 point review of system is done which is negative except mentioned above/history of present illness.   PAST MEDICAL HISTORY :  Past Medical History  Diagnosis Date  . Hypertension   . Cervical cancer ([place])     cervical cancer history  . Iron deficiency anemia 2008  . GI bleed   . History of blood transfusion   . Bilateral cellulitis of lower leg   . CAD (coronary artery disease)   . Fatty liver   . Hyperlipidemia   . GERD  (gastroesophageal reflux disease)   . History of nicotine use   . Chronic edema   . Diabetes mellitus with stage 3 chronic kidney disease ([place])   . Trigeminal neuralgia   . Aortic stenosis   . Cardiomyopathy ([place])   . Gout   . Peptic ulcer disease   . Angiodysplasia   . Vascular malformation 06/14/1997  . Adenomatous polyps 03/05/2012  . COPD (chronic obstructive pulmonary disease) ([place])   . CHF (congestive heart failure) ([place])   . Fibrocystic breast changes   . Irritable bowel syndrome   . Arthritis   . Carotid bruit   . Tobacco abuse   . Osteoarthritis   . Macrocytosis   . Abnormal LFTs (liver function tests)   . PVD (peripheral vascular disease) ([place])     PAST SURGICAL HISTORY :   Past Surgical History  Procedure Laterality Date  . Abdominal hysterectomy    . Bone marrow biopsy  1999  . Angiogram cardiac stenting    . Thoracentesis    . Esophagogastroduodenoscopy  12/2014, 2014, 2013, 2009, 2004  . Colonoscopy  12/2014, 2014, 2013, 2008, 2004    03/05/2012-Adenomatous Polyps;09/23/2003-Polyp remains intact;   . Cholecystectomy    . Tonsillectomy    . Nasal sinus surgery    . Endoscopic carpal tunnel release      FAMILY HISTORY :   Family History  Problem Relation Age of Onset  . Parkinsonism Mother   . Heart attack Mother   . Lung cancer Father   . Diabetes Mellitus II Father   . Asthma Sister   . Asthma Brother   .  Heart disease Son     SOCIAL HISTORY:   Social History  Substance Use Topics  . Smoking status: Former Smoker -- 0.50 packs/day for 56 years    Types: Cigarettes    Quit date: 05/23/2013  . Smokeless tobacco: Never Used  . Alcohol Use: No    ALLERGIES:  is allergic to atenolol; darvon; hydrochlorothiazide; iodinated diagnostic agents; nylon; other; and pravastatin.  MEDICATIONS:  Current Outpatient Prescriptions  Medication Sig Dispense Refill  . CARTIA XT 240 MG 24 hr capsule Take 240 mg by mouth daily.    Marland Kitchen levofloxacin (LEVAQUIN)  500 MG tablet Take 1 tablet by mouth daily. x10 days    . lisinopril (PRINIVIL,ZESTRIL) 40 MG tablet Take 40 mg by mouth daily.    . pantoprazole (PROTONIX) 20 MG tablet Take 20 mg by mouth daily.    . simvastatin (ZOCOR) 20 MG tablet Take 20 mg by mouth at bedtime.     Marland Kitchen SPIRIVA HANDIHALER 18 MCG inhalation capsule Place 1 capsule into inhaler and inhale daily.    Marland Kitchen torsemide (DEMADEX) 20 MG tablet Take 20 mg by mouth daily.    Marland Kitchen albuterol (PROAIR HFA) 108 (90 BASE) MCG/ACT inhaler Inhale 2 Inhalers into the lungs every 6 (six) hours as needed. wheezing    . ANORO ELLIPTA 62.5-25 MCG/INH AEPB      No current facility-administered medications for this visit.    PHYSICAL EXAMINATION: ECOG PERFORMANCE STATUS: 2 - Symptomatic, <50% confined to bed  BP 181/72 mmHg  Pulse 77  Temp(Src) 97 F (36.1 C) (Tympanic)  Ht 5' 3"  (1.6 m)  Wt 190 lb (86.183 kg)  BMI 33.67 kg/m2  SpO2 96%  Filed Weights   09/27/15 1013  Weight: 190 lb (86.183 kg)    GENERAL: Well-nourished well-developed; Alert, no distress and comfortable.   She is accompanied by her brother. She is in a wheelchair. She cannot sit on the exam table.  EYES: no pallor or icterus OROPHARYNX: no thrush or ulceration; good dentition  NECK: supple, no masses felt LYMPH:  no palpable lymphadenopathy in the cervical,  LUNGS: clear to auscultation and  No wheeze or crackles HEART/CVS: regular rate & rhythm and positive for systolic murmurs; bilateral lower extremity swelling ; left larger than the right /erythema noted on the left leg.  ABDOMEN:abdomen soft, non-tender and normal bowel sounds Musculoskeletal:no cyanosis of digits and no clubbing  PSYCH: alert & oriented x 3 with fluent speech NEURO: no focal motor/sensory deficits SKIN:  erythema of the left lower extremity /improving.   LABORATORY DATA:  I have reviewed the data as listed    Component Value Date/Time   NA 140 05/23/2015 1433   NA 139 01/18/2015 0545   K 5.1  05/23/2015 1433   K 3.9 01/18/2015 0545   CL 102 05/23/2015 1433   CL 105 01/18/2015 0545   CO2 28 05/23/2015 1433   CO2 25 01/18/2015 0545   GLUCOSE 195* 05/23/2015 1433   GLUCOSE 179* 01/18/2015 0545   BUN 28* 05/23/2015 1433   BUN 29* 01/18/2015 0545   CREATININE 1.58* 05/23/2015 1433   CREATININE 1.80* 01/18/2015 0545   CALCIUM 9.2 05/23/2015 1433   CALCIUM 8.7 01/18/2015 0545   PROT 7.2 05/23/2015 1433   PROT 6.6 06/20/2014 1101   ALBUMIN 3.5 05/23/2015 1433   ALBUMIN 3.1* 06/20/2014 1101   AST 24 05/23/2015 1433   AST 32 06/20/2014 1101   ALT 21 05/23/2015 1433   ALT 31 06/20/2014 1101   ALKPHOS  128* 05/23/2015 1433   ALKPHOS 125* 06/20/2014 1101   BILITOT 0.4 05/23/2015 1433   BILITOT 0.3 06/20/2014 1101   GFRNONAA 30* 05/23/2015 1433   GFRNONAA 29* 01/18/2015 0545   GFRNONAA 28* 06/20/2014 1101   GFRAA 35* 05/23/2015 1433   GFRAA 35* 01/18/2015 0545   GFRAA 33* 06/20/2014 1101    No results found for: SPEP, UPEP  Lab Results  Component Value Date   WBC 5.5 08/30/2015   NEUTROABS 4.5 08/30/2015   HGB 9.6* 09/27/2015   HCT 26.9* 08/30/2015   MCV 89.5 08/30/2015   PLT 118* 08/30/2015      Chemistry      Component Value Date/Time   NA 140 05/23/2015 1433   NA 139 01/18/2015 0545   K 5.1 05/23/2015 1433   K 3.9 01/18/2015 0545   CL 102 05/23/2015 1433   CL 105 01/18/2015 0545   CO2 28 05/23/2015 1433   CO2 25 01/18/2015 0545   BUN 28* 05/23/2015 1433   BUN 29* 01/18/2015 0545   CREATININE 1.58* 05/23/2015 1433   CREATININE 1.80* 01/18/2015 0545      Component Value Date/Time   CALCIUM 9.2 05/23/2015 1433   CALCIUM 8.7 01/18/2015 0545   ALKPHOS 128* 05/23/2015 1433   ALKPHOS 125* 06/20/2014 1101   AST 24 05/23/2015 1433   AST 32 06/20/2014 1101   ALT 21 05/23/2015 1433   ALT 31 06/20/2014 1101   BILITOT 0.4 05/23/2015 1433   BILITOT 0.3 06/20/2014 1101       RADIOGRAPHIC STUDIES: I have personally reviewed the radiological images as  listed and agreed with the findings in the report. No results found.   ASSESSMENT & PLAN:   # CHRONIC ANEMIA- secondary to GI bleed/AV malformations; iron deficiency and chronic kidney disease. Patient's last IV iron was in 08/11/2015; Procrit 28th of September. Today hemoglobin is 9.6/fairly stable at this time.  Patient claims to have vague intolerance to Procrit with the diarrhea. As per the patient wishes we will hold Procrit today; but would recommend IV iron Venofer on a weekly basis.  # Diarrhea-less likely from [place]. Since patient is on antibiotics for cellulitis- recommend checking for C. Difficile. Recommend probiotics/yogurt for now.  # Thrombocytopenia platelets 118 most recently in September; unclear etiology. Monitor for now.  # Patient will follow-up with me in approximately 6 weeks.    No orders of the defined types were placed in this encounter.   All questions were answered. The patient knows to call the clinic with any problems, questions or concerns. No barriers to learning was detected. I spent 25 minutes counseling the patient face to face. The total time spent in the appointment was 30 minutes and more than 50% was on counseling and review of test results     Cammie Sickle, MD 09/27/2015 10:28 AM

## 2015-09-28 ENCOUNTER — Other Ambulatory Visit: Payer: Self-pay

## 2015-09-28 ENCOUNTER — Inpatient Hospital Stay: Payer: Medicare Other

## 2015-09-28 VITALS — BP 119/73 | HR 97 | Temp 98.3°F | Resp 20

## 2015-09-28 DIAGNOSIS — D500 Iron deficiency anemia secondary to blood loss (chronic): Secondary | ICD-10-CM

## 2015-09-28 DIAGNOSIS — I129 Hypertensive chronic kidney disease with stage 1 through stage 4 chronic kidney disease, or unspecified chronic kidney disease: Secondary | ICD-10-CM | POA: Diagnosis not present

## 2015-09-28 DIAGNOSIS — R197 Diarrhea, unspecified: Secondary | ICD-10-CM

## 2015-09-28 LAB — C DIFFICILE QUICK SCREEN W PCR REFLEX
C DIFFICILE (CDIFF) INTERP: NEGATIVE
C Diff antigen: NEGATIVE
C Diff toxin: NEGATIVE

## 2015-09-28 MED ORDER — SODIUM CHLORIDE 0.9 % IV SOLN
INTRAVENOUS | Status: DC
  Administered 2015-09-28: 14:00:00 via INTRAVENOUS
  Filled 2015-09-28: qty 1000

## 2015-09-28 MED ORDER — HEPARIN SOD (PORK) LOCK FLUSH 100 UNIT/ML IV SOLN
INTRAVENOUS | Status: AC
  Filled 2015-09-28: qty 5

## 2015-09-28 MED ORDER — SODIUM CHLORIDE 0.9 % IV SOLN
200.0000 mg | Freq: Once | INTRAVENOUS | Status: AC
  Administered 2015-09-28: 200 mg via INTRAVENOUS
  Filled 2015-09-28: qty 10

## 2015-09-28 MED ORDER — HEPARIN SOD (PORK) LOCK FLUSH 10 UNIT/ML IV SOLN
10.0000 [IU] | Freq: Once | INTRAVENOUS | Status: AC
  Administered 2015-09-28: 10 [IU]

## 2015-09-28 MED ORDER — SODIUM CHLORIDE 0.9 % IJ SOLN
10.0000 mL | INTRAMUSCULAR | Status: DC | PRN
  Filled 2015-09-28: qty 10

## 2015-10-02 ENCOUNTER — Telehealth: Payer: Self-pay | Admitting: *Deleted

## 2015-10-02 NOTE — Telephone Encounter (Signed)
Called patient. Left voice mail message. Patient's stool for c-diff is negative.

## 2015-10-04 ENCOUNTER — Inpatient Hospital Stay: Payer: Medicare Other

## 2015-10-04 VITALS — BP 128/68 | HR 73 | Temp 98.4°F

## 2015-10-04 DIAGNOSIS — I129 Hypertensive chronic kidney disease with stage 1 through stage 4 chronic kidney disease, or unspecified chronic kidney disease: Secondary | ICD-10-CM | POA: Diagnosis not present

## 2015-10-04 DIAGNOSIS — D500 Iron deficiency anemia secondary to blood loss (chronic): Secondary | ICD-10-CM

## 2015-10-04 LAB — SAMPLE TO BLOOD BANK

## 2015-10-04 LAB — FERRITIN: Ferritin: 63 ng/mL (ref 11–307)

## 2015-10-04 LAB — HEMOGLOBIN: HEMOGLOBIN: 9.7 g/dL — AB (ref 12.0–16.0)

## 2015-10-04 LAB — LACTATE DEHYDROGENASE: LDH: 156 U/L (ref 98–192)

## 2015-10-04 MED ORDER — SODIUM CHLORIDE 0.9 % IV SOLN: 200.0000 mg | Freq: Once | INTRAVENOUS | Status: DC

## 2015-10-04 MED ORDER — EPOETIN ALFA 20000 UNIT/ML IJ SOLN
20000.0000 [IU] | Freq: Once | INTRAMUSCULAR | Status: AC
  Administered 2015-10-04: 20000 [IU] via SUBCUTANEOUS
  Filled 2015-10-04: qty 1

## 2015-10-05 ENCOUNTER — Inpatient Hospital Stay: Payer: Medicare Other

## 2015-10-05 VITALS — BP 109/79 | HR 76 | Temp 96.4°F | Resp 18

## 2015-10-05 DIAGNOSIS — D500 Iron deficiency anemia secondary to blood loss (chronic): Secondary | ICD-10-CM

## 2015-10-05 DIAGNOSIS — I129 Hypertensive chronic kidney disease with stage 1 through stage 4 chronic kidney disease, or unspecified chronic kidney disease: Secondary | ICD-10-CM | POA: Diagnosis not present

## 2015-10-05 MED ORDER — SODIUM CHLORIDE 0.9 % IV SOLN
200.0000 mg | Freq: Once | INTRAVENOUS | Status: AC
  Administered 2015-10-05: 200 mg via INTRAVENOUS
  Filled 2015-10-05: qty 10

## 2015-10-05 MED ORDER — HEPARIN SOD (PORK) LOCK FLUSH 100 UNIT/ML IV SOLN
500.0000 [IU] | Freq: Once | INTRAVENOUS | Status: AC
  Administered 2015-10-05: 500 [IU] via INTRAVENOUS
  Filled 2015-10-05: qty 5

## 2015-10-05 MED ORDER — SODIUM CHLORIDE 0.9 % IJ SOLN
10.0000 mL | INTRAMUSCULAR | Status: DC | PRN
  Administered 2015-10-05: 10 mL via INTRAVENOUS
  Filled 2015-10-05: qty 10

## 2015-10-05 MED ORDER — SODIUM CHLORIDE 0.9 % IV SOLN
INTRAVENOUS | Status: DC
  Administered 2015-10-05: 14:00:00 via INTRAVENOUS
  Filled 2015-10-05: qty 1000

## 2015-10-11 ENCOUNTER — Inpatient Hospital Stay: Payer: Medicare Other

## 2015-10-11 ENCOUNTER — Inpatient Hospital Stay: Payer: Medicare Other | Admitting: *Deleted

## 2015-10-11 ENCOUNTER — Other Ambulatory Visit: Payer: Self-pay | Admitting: *Deleted

## 2015-10-11 VITALS — BP 140/83 | HR 79 | Temp 97.0°F | Resp 18

## 2015-10-11 DIAGNOSIS — I129 Hypertensive chronic kidney disease with stage 1 through stage 4 chronic kidney disease, or unspecified chronic kidney disease: Secondary | ICD-10-CM | POA: Diagnosis not present

## 2015-10-11 DIAGNOSIS — D500 Iron deficiency anemia secondary to blood loss (chronic): Secondary | ICD-10-CM

## 2015-10-11 LAB — SAMPLE TO BLOOD BANK

## 2015-10-11 LAB — IRON AND TIBC
IRON: 41 ug/dL (ref 28–170)
Saturation Ratios: 12 % (ref 10.4–31.8)
TIBC: 331 ug/dL (ref 250–450)
UIBC: 290 ug/dL

## 2015-10-11 LAB — HEMOGLOBIN: HEMOGLOBIN: 9.7 g/dL — AB (ref 12.0–16.0)

## 2015-10-11 LAB — FERRITIN: FERRITIN: 69 ng/mL (ref 11–307)

## 2015-10-11 MED ORDER — EPOETIN ALFA 20000 UNIT/ML IJ SOLN
20000.0000 [IU] | Freq: Once | INTRAMUSCULAR | Status: AC
  Administered 2015-10-11: 20000 [IU] via SUBCUTANEOUS
  Filled 2015-10-11: qty 1

## 2015-10-12 ENCOUNTER — Inpatient Hospital Stay: Payer: Medicare Other

## 2015-10-12 VITALS — BP 145/71 | HR 74 | Temp 98.7°F | Resp 18

## 2015-10-12 DIAGNOSIS — I129 Hypertensive chronic kidney disease with stage 1 through stage 4 chronic kidney disease, or unspecified chronic kidney disease: Secondary | ICD-10-CM | POA: Diagnosis not present

## 2015-10-12 DIAGNOSIS — D500 Iron deficiency anemia secondary to blood loss (chronic): Secondary | ICD-10-CM

## 2015-10-12 MED ORDER — SODIUM CHLORIDE 0.9 % IJ SOLN
10.0000 mL | INTRAMUSCULAR | Status: DC | PRN
  Administered 2015-10-12: 10 mL via INTRAVENOUS
  Filled 2015-10-12: qty 10

## 2015-10-12 MED ORDER — SODIUM CHLORIDE 0.9 % IV SOLN
INTRAVENOUS | Status: DC
  Administered 2015-10-12: 14:00:00 via INTRAVENOUS
  Filled 2015-10-12: qty 1000

## 2015-10-12 MED ORDER — HEPARIN SOD (PORK) LOCK FLUSH 100 UNIT/ML IV SOLN
500.0000 [IU] | Freq: Once | INTRAVENOUS | Status: AC
  Administered 2015-10-12: 500 [IU] via INTRAVENOUS
  Filled 2015-10-12: qty 5

## 2015-10-12 MED ORDER — SODIUM CHLORIDE 0.9 % IV SOLN
200.0000 mg | Freq: Once | INTRAVENOUS | Status: AC
  Administered 2015-10-12: 200 mg via INTRAVENOUS
  Filled 2015-10-12: qty 10

## 2015-10-18 ENCOUNTER — Inpatient Hospital Stay: Payer: Medicare Other

## 2015-10-18 DIAGNOSIS — I129 Hypertensive chronic kidney disease with stage 1 through stage 4 chronic kidney disease, or unspecified chronic kidney disease: Secondary | ICD-10-CM | POA: Diagnosis not present

## 2015-10-18 DIAGNOSIS — D500 Iron deficiency anemia secondary to blood loss (chronic): Secondary | ICD-10-CM

## 2015-10-18 LAB — HEMOGLOBIN: HEMOGLOBIN: 10.6 g/dL — AB (ref 12.0–16.0)

## 2015-10-18 LAB — SAMPLE TO BLOOD BANK

## 2015-10-19 ENCOUNTER — Inpatient Hospital Stay: Payer: Medicare Other

## 2015-10-19 VITALS — BP 161/78 | HR 76 | Temp 96.0°F | Resp 20

## 2015-10-19 DIAGNOSIS — I129 Hypertensive chronic kidney disease with stage 1 through stage 4 chronic kidney disease, or unspecified chronic kidney disease: Secondary | ICD-10-CM | POA: Diagnosis not present

## 2015-10-19 DIAGNOSIS — D500 Iron deficiency anemia secondary to blood loss (chronic): Secondary | ICD-10-CM

## 2015-10-19 MED ORDER — SODIUM CHLORIDE 0.9 % IJ SOLN
10.0000 mL | INTRAMUSCULAR | Status: AC | PRN
  Administered 2015-10-19: 10 mL
  Filled 2015-10-19: qty 10

## 2015-10-19 MED ORDER — SODIUM CHLORIDE 0.9 % IV SOLN
INTRAVENOUS | Status: DC
  Administered 2015-10-19: 14:00:00 via INTRAVENOUS
  Filled 2015-10-19: qty 1000

## 2015-10-19 MED ORDER — HEPARIN SOD (PORK) LOCK FLUSH 100 UNIT/ML IV SOLN
500.0000 [IU] | INTRAVENOUS | Status: AC | PRN
  Administered 2015-10-19: 500 [IU]

## 2015-10-19 MED ORDER — SODIUM CHLORIDE 0.9 % IV SOLN
200.0000 mg | Freq: Once | INTRAVENOUS | Status: DC
  Filled 2015-10-19: qty 10

## 2015-10-19 MED ORDER — SODIUM CHLORIDE 0.9 % IV SOLN
200.0000 mg | Freq: Once | INTRAVENOUS | Status: AC
  Administered 2015-10-19: 200 mg via INTRAVENOUS
  Filled 2015-10-19: qty 10

## 2015-10-19 MED ORDER — HEPARIN SOD (PORK) LOCK FLUSH 100 UNIT/ML IV SOLN
INTRAVENOUS | Status: AC
  Filled 2015-10-19: qty 5

## 2015-10-25 ENCOUNTER — Ambulatory Visit: Payer: Medicare Other

## 2015-10-25 ENCOUNTER — Other Ambulatory Visit: Payer: Medicare Other

## 2015-10-26 ENCOUNTER — Other Ambulatory Visit: Payer: Self-pay | Admitting: Internal Medicine

## 2015-10-26 ENCOUNTER — Inpatient Hospital Stay: Payer: Medicare Other | Attending: Internal Medicine

## 2015-10-26 DIAGNOSIS — D696 Thrombocytopenia, unspecified: Secondary | ICD-10-CM | POA: Diagnosis not present

## 2015-10-26 DIAGNOSIS — K589 Irritable bowel syndrome without diarrhea: Secondary | ICD-10-CM | POA: Diagnosis not present

## 2015-10-26 DIAGNOSIS — I509 Heart failure, unspecified: Secondary | ICD-10-CM | POA: Insufficient documentation

## 2015-10-26 DIAGNOSIS — E785 Hyperlipidemia, unspecified: Secondary | ICD-10-CM | POA: Insufficient documentation

## 2015-10-26 DIAGNOSIS — K219 Gastro-esophageal reflux disease without esophagitis: Secondary | ICD-10-CM | POA: Diagnosis not present

## 2015-10-26 DIAGNOSIS — M109 Gout, unspecified: Secondary | ICD-10-CM | POA: Diagnosis not present

## 2015-10-26 DIAGNOSIS — I739 Peripheral vascular disease, unspecified: Secondary | ICD-10-CM | POA: Insufficient documentation

## 2015-10-26 DIAGNOSIS — D631 Anemia in chronic kidney disease: Secondary | ICD-10-CM | POA: Diagnosis not present

## 2015-10-26 DIAGNOSIS — I429 Cardiomyopathy, unspecified: Secondary | ICD-10-CM | POA: Insufficient documentation

## 2015-10-26 DIAGNOSIS — N183 Chronic kidney disease, stage 3 (moderate): Secondary | ICD-10-CM | POA: Insufficient documentation

## 2015-10-26 DIAGNOSIS — I2510 Atherosclerotic heart disease of native coronary artery without angina pectoris: Secondary | ICD-10-CM | POA: Diagnosis not present

## 2015-10-26 DIAGNOSIS — J449 Chronic obstructive pulmonary disease, unspecified: Secondary | ICD-10-CM | POA: Diagnosis not present

## 2015-10-26 DIAGNOSIS — Z79899 Other long term (current) drug therapy: Secondary | ICD-10-CM | POA: Diagnosis not present

## 2015-10-26 DIAGNOSIS — Z87891 Personal history of nicotine dependence: Secondary | ICD-10-CM | POA: Insufficient documentation

## 2015-10-26 DIAGNOSIS — I129 Hypertensive chronic kidney disease with stage 1 through stage 4 chronic kidney disease, or unspecified chronic kidney disease: Secondary | ICD-10-CM | POA: Insufficient documentation

## 2015-10-26 DIAGNOSIS — D500 Iron deficiency anemia secondary to blood loss (chronic): Secondary | ICD-10-CM

## 2015-10-26 DIAGNOSIS — Z8541 Personal history of malignant neoplasm of cervix uteri: Secondary | ICD-10-CM | POA: Insufficient documentation

## 2015-10-26 DIAGNOSIS — E1122 Type 2 diabetes mellitus with diabetic chronic kidney disease: Secondary | ICD-10-CM | POA: Diagnosis not present

## 2015-10-26 MED ORDER — SODIUM CHLORIDE 0.9 % IV SOLN
Freq: Once | INTRAVENOUS | Status: AC
Start: 2015-10-26 — End: 2015-10-26
  Administered 2015-10-26: 15:00:00 via INTRAVENOUS
  Filled 2015-10-26: qty 1000

## 2015-10-26 MED ORDER — SODIUM CHLORIDE 0.9 % IV SOLN: 500.0000 mg | Freq: Once | INTRAVENOUS | Status: DC

## 2015-10-26 MED ORDER — HEPARIN SOD (PORK) LOCK FLUSH 100 UNIT/ML IV SOLN
500.0000 [IU] | Freq: Once | INTRAVENOUS | Status: AC
  Administered 2015-10-26: 500 [IU] via INTRAVENOUS

## 2015-10-26 MED ORDER — HEPARIN SOD (PORK) LOCK FLUSH 100 UNIT/ML IV SOLN
INTRAVENOUS | Status: AC
  Filled 2015-10-26: qty 5

## 2015-10-26 MED ORDER — FERUMOXYTOL INJECTION 510 MG/17 ML
510.0000 mg | Freq: Once | INTRAVENOUS | Status: AC
  Administered 2015-10-26: 510 mg via INTRAVENOUS
  Filled 2015-10-26: qty 17

## 2015-11-02 ENCOUNTER — Other Ambulatory Visit: Payer: Self-pay | Admitting: Internal Medicine

## 2015-11-02 ENCOUNTER — Inpatient Hospital Stay: Payer: Medicare Other

## 2015-11-02 VITALS — BP 115/61 | HR 79 | Temp 97.5°F | Resp 20

## 2015-11-02 DIAGNOSIS — D500 Iron deficiency anemia secondary to blood loss (chronic): Secondary | ICD-10-CM

## 2015-11-02 DIAGNOSIS — I129 Hypertensive chronic kidney disease with stage 1 through stage 4 chronic kidney disease, or unspecified chronic kidney disease: Secondary | ICD-10-CM | POA: Diagnosis not present

## 2015-11-02 MED ORDER — SODIUM CHLORIDE 0.9 % IV SOLN
Freq: Once | INTRAVENOUS | Status: AC
  Administered 2015-11-02: 14:00:00 via INTRAVENOUS
  Filled 2015-11-02: qty 1000

## 2015-11-02 MED ORDER — SODIUM CHLORIDE 0.9 % IV SOLN
510.0000 mg | Freq: Once | INTRAVENOUS | Status: AC
  Administered 2015-11-02: 510 mg via INTRAVENOUS
  Filled 2015-11-02: qty 17

## 2015-11-02 MED ORDER — SODIUM CHLORIDE 0.9 % IJ SOLN
10.0000 mL | INTRAMUSCULAR | Status: DC | PRN
  Administered 2015-11-02: 10 mL via INTRAVENOUS
  Filled 2015-11-02: qty 10

## 2015-11-02 MED ORDER — HEPARIN SOD (PORK) LOCK FLUSH 100 UNIT/ML IV SOLN
500.0000 [IU] | Freq: Once | INTRAVENOUS | Status: AC
  Administered 2015-11-02: 500 [IU] via INTRAVENOUS
  Filled 2015-11-02: qty 5

## 2015-11-02 NOTE — Progress Notes (Signed)
Spoke with Dr. Rogue Bussing and he said to go ahead with current Feraheme orders in the supportive therapy plan today. LJ

## 2015-11-08 ENCOUNTER — Inpatient Hospital Stay: Payer: Medicare Other

## 2015-11-08 ENCOUNTER — Inpatient Hospital Stay (HOSPITAL_BASED_OUTPATIENT_CLINIC_OR_DEPARTMENT_OTHER): Payer: Medicare Other | Admitting: Internal Medicine

## 2015-11-08 ENCOUNTER — Other Ambulatory Visit: Payer: Self-pay | Admitting: *Deleted

## 2015-11-08 VITALS — BP 175/75 | HR 60 | Temp 98.0°F | Resp 18 | Ht 63.0 in | Wt 175.0 lb

## 2015-11-08 DIAGNOSIS — D696 Thrombocytopenia, unspecified: Secondary | ICD-10-CM | POA: Diagnosis not present

## 2015-11-08 DIAGNOSIS — N183 Chronic kidney disease, stage 3 unspecified: Secondary | ICD-10-CM

## 2015-11-08 DIAGNOSIS — D500 Iron deficiency anemia secondary to blood loss (chronic): Secondary | ICD-10-CM

## 2015-11-08 DIAGNOSIS — I129 Hypertensive chronic kidney disease with stage 1 through stage 4 chronic kidney disease, or unspecified chronic kidney disease: Secondary | ICD-10-CM | POA: Diagnosis not present

## 2015-11-08 DIAGNOSIS — Z79899 Other long term (current) drug therapy: Secondary | ICD-10-CM

## 2015-11-08 DIAGNOSIS — D509 Iron deficiency anemia, unspecified: Secondary | ICD-10-CM

## 2015-11-08 DIAGNOSIS — D631 Anemia in chronic kidney disease: Secondary | ICD-10-CM | POA: Diagnosis not present

## 2015-11-08 DIAGNOSIS — Z8541 Personal history of malignant neoplasm of cervix uteri: Secondary | ICD-10-CM

## 2015-11-08 LAB — HEMOGLOBIN: HEMOGLOBIN: 10.8 g/dL — AB (ref 12.0–16.0)

## 2015-11-08 LAB — SAMPLE TO BLOOD BANK

## 2015-11-08 LAB — FERRITIN: Ferritin: 556 ng/mL — ABNORMAL HIGH (ref 11–307)

## 2015-11-08 NOTE — Progress Notes (Signed)
[facility] OFFICE PROGRESS NOTE  Patient Care Team: Morgan Ruths, MD as PCP - General (Internal Medicine) Cammie Sickle, MD as Consulting Physician (Hematology and Oncology)   SUMMARY OF HEMATOLOGIC HISTORY:  # CHRONIC ANEMIA sec Hx GIB/CKD [ CKD STAGE III]; Jan-2016 [colo/EGD- Dr.Elliot]; capsule x2 - angiodysplasia; BMBx- 1999; IV iron & procrit; Declined procrit [sep 2016]; last NOV 2016; [place]]  # CHRONIC CELLULITIS/ AORTIC STENOSIS/ LIMITED MOBILITY   INTERVAL HISTORY:  80 year old female patient with multiple medical problems and above history of chronic anemia from chronic GI blood loss/chronic kidney disease on iron and Procrit is here for follow-up. Patient declined Procrit since September 2016  In the interim patient received IV iron approximately 2 weeks ago; symptomatically she feels better. Denies any shortness of breath or chest pain.  Swelling in legs are improved; she is on diuretics were PCP. No active bleeding.Patient had multiple colonoscopies/EGDs in the past; most recent in January 2016.    REVIEW OF SYSTEMS:  A complete 10 point review of system is done which is negative except mentioned above/history of present illness.   PAST MEDICAL HISTORY :  Past Medical History  Diagnosis Date  . Hypertension   . Cervical cancer ([place])     cervical cancer history  . Iron deficiency anemia 2008  . GI bleed   . History of blood transfusion   . Bilateral cellulitis of lower leg   . CAD (coronary artery disease)   . Fatty liver   . Hyperlipidemia   . GERD (gastroesophageal reflux disease)   . History of nicotine use   . Chronic edema   . Diabetes mellitus with stage 3 chronic kidney disease ([place])   . Trigeminal neuralgia   . Aortic stenosis   . Cardiomyopathy ([place])   . Gout   . Peptic ulcer disease   . Angiodysplasia   . Vascular malformation 06/14/1997  . Adenomatous polyps 03/05/2012  . COPD (chronic obstructive pulmonary  disease) ([place])   . CHF (congestive heart failure) ([place])   . Fibrocystic breast changes   . Irritable bowel syndrome   . Arthritis   . Carotid bruit   . Tobacco abuse   . Osteoarthritis   . Macrocytosis   . Abnormal LFTs (liver function tests)   . PVD (peripheral vascular disease) ([place])   . AV malformation of GI tract   . Clostridium difficile infection March 02, 2015    PAST SURGICAL HISTORY :   Past Surgical History  Procedure Laterality Date  . Abdominal hysterectomy    . Bone marrow biopsy  1999  . Angiogram cardiac stenting    . Thoracentesis    . Esophagogastroduodenoscopy  12/2014, 2014, 2013, 2009, 2004  . Colonoscopy  12/2014, 2014, 2013, 2008, 2004    03/05/2012-Adenomatous Polyps;09/23/2003-Polyp remains intact;   . Cholecystectomy    . Tonsillectomy    . Nasal sinus surgery    . Endoscopic carpal tunnel release      FAMILY HISTORY :   Family History  Problem Relation Age of Onset  . Parkinsonism Mother   . Heart attack Mother   . Lung cancer Father   . Diabetes Mellitus II Father   . Asthma Sister   . Asthma Brother   . Heart disease Son     SOCIAL HISTORY:   Social History  Substance Use Topics  . Smoking status: Former Smoker -- 0.50 packs/day for 56 years    Types: Cigarettes    Quit date: 05/23/2013  .  Smokeless tobacco: Never Used  . Alcohol Use: No    ALLERGIES:  is allergic to aspirin; atenolol; clopidogrel bisulfate; darvon; hydrochlorothiazide; iodinated diagnostic agents; nylon; other; and pravastatin.  MEDICATIONS:  Current Outpatient Prescriptions  Medication Sig Dispense Refill  . lisinopril (PRINIVIL,ZESTRIL) 40 MG tablet Take 40 mg by mouth daily.    . metolazone (ZAROXOLYN) 5 MG tablet Take 1 tablet by mouth daily.    . metoprolol succinate (TOPROL-XL) 50 MG 24 hr tablet Take 1 tablet by mouth daily.    . pantoprazole (PROTONIX) 20 MG tablet Take 20 mg by mouth daily.    . pantoprazole (PROTONIX) 40 MG tablet Take 1 tablet by  mouth daily.    . potassium chloride (K-DUR) 10 MEQ tablet Take 1 tablet by mouth daily.    . simvastatin (ZOCOR) 20 MG tablet Take 20 mg by mouth at bedtime.     . torsemide (DEMADEX) 20 MG tablet Take 1 tablet by mouth 3 (three) times daily.     No current facility-administered medications for this visit.   Facility-Administered Medications Ordered in Other Visits  Medication Dose Route Frequency Provider Last Rate Last Dose  . epoetin alfa (EPOGEN,PROCRIT) injection 20,000 Units  20,000 Units Subcutaneous Once Leia Alf, MD   20,000 Units at 09/27/15 1205    PHYSICAL EXAMINATION: ECOG PERFORMANCE STATUS: 2 - Symptomatic, <50% confined to bed  BP 175/75 mmHg  Pulse 60  Temp(Src) 98 F (36.7 C) (Oral)  Resp 18  Ht _0  (1.6 m)  Wt 175 lb 0.7 oz (79.4 kg)  BMI 31.02 kg/m2  SpO2 98%  Filed Weights   11/08/15 1330  Weight: 175 lb 0.7 oz (79.4 kg)    GENERAL: Well-nourished well-developed; Alert, no distress and comfortable.   She is accompanied by her brother. She is in a wheelchair. She cannot sit on the exam table.  EYES: no pallor or icterus OROPHARYNX: no thrush or ulceration; good dentition  NECK: supple, no masses felt LYMPH:  no palpable lymphadenopathy in the cervical,  LUNGS: clear to auscultation and  No wheeze or crackles HEART/CVS: regular rate & rhythm and positive for systolic murmurs; bilateral lower extremity swelling ; left larger than the right /erythema noted on the left leg.  ABDOMEN:abdomen soft, non-tender and normal bowel sounds Musculoskeletal:no cyanosis of digits and no clubbing  PSYCH: alert & oriented x 3 with fluent speech NEURO: no focal motor/sensory deficits SKIN:  erythema of the left lower extremity /improving.   LABORATORY DATA:  I have reviewed the data as listed    Component Value Date/Time   NA 140 05/23/2015 1433   NA 139 01/18/2015 0545   K 5.1 05/23/2015 1433   K 3.9 01/18/2015 0545   CL 102 05/23/2015 1433   CL 105  01/18/2015 0545   CO2 28 05/23/2015 1433   CO2 25 01/18/2015 0545   GLUCOSE 195* 05/23/2015 1433   GLUCOSE 179* 01/18/2015 0545   BUN 28* 05/23/2015 1433   BUN 29* 01/18/2015 0545   CREATININE 1.58* 05/23/2015 1433   CREATININE 1.80* 01/18/2015 0545   CALCIUM 9.2 05/23/2015 1433   CALCIUM 8.7 01/18/2015 0545   PROT 7.2 05/23/2015 1433   PROT 6.6 06/20/2014 1101   ALBUMIN 3.5 05/23/2015 1433   ALBUMIN 3.1* 06/20/2014 1101   AST 24 05/23/2015 1433   AST 32 06/20/2014 1101   ALT 21 05/23/2015 1433   ALT 31 06/20/2014 1101   ALKPHOS 128* 05/23/2015 1433   ALKPHOS 125* 06/20/2014 1101  BILITOT 0.4 05/23/2015 1433   BILITOT 0.3 06/20/2014 1101   GFRNONAA 30* 05/23/2015 1433   GFRNONAA 29* 01/18/2015 0545   GFRNONAA 28* 06/20/2014 1101   GFRAA 35* 05/23/2015 1433   GFRAA 35* 01/18/2015 0545   GFRAA 33* 06/20/2014 1101    No results found for: SPEP, UPEP  Lab Results  Component Value Date   WBC 5.5 08/30/2015   NEUTROABS 4.5 08/30/2015   HGB 10.8* 11/08/2015   HCT 26.9* 08/30/2015   MCV 89.5 08/30/2015   PLT 118* 08/30/2015      Chemistry      Component Value Date/Time   NA 140 05/23/2015 1433   NA 139 01/18/2015 0545   K 5.1 05/23/2015 1433   K 3.9 01/18/2015 0545   CL 102 05/23/2015 1433   CL 105 01/18/2015 0545   CO2 28 05/23/2015 1433   CO2 25 01/18/2015 0545   BUN 28* 05/23/2015 1433   BUN 29* 01/18/2015 0545   CREATININE 1.58* 05/23/2015 1433   CREATININE 1.80* 01/18/2015 0545      Component Value Date/Time   CALCIUM 9.2 05/23/2015 1433   CALCIUM 8.7 01/18/2015 0545   ALKPHOS 128* 05/23/2015 1433   ALKPHOS 125* 06/20/2014 1101   AST 24 05/23/2015 1433   AST 32 06/20/2014 1101   ALT 21 05/23/2015 1433   ALT 31 06/20/2014 1101   BILITOT 0.4 05/23/2015 1433   BILITOT 0.3 06/20/2014 1101       RADIOGRAPHIC STUDIES: I have personally reviewed the radiological images as listed and agreed with the findings in the report. No results found.    ASSESSMENT & PLAN:   # CHRONIC ANEMIA- secondary to GI bleed/AV malformations; iron deficiency and chronic kidney disease.  Patient status post IV iron approximately 2 weeks ago [place]et [place] 2016 Feraheme 2]. Today hemoglobin is 10.6. Ferritin pending. Patient continues to decline Procrit secondary to "diarrhea".   # Will check CBC in approximately 3 weeks; IV Venofer if needed at that time. Follow-up with me in 6 weeks.  # Artery stenosis -Patient is awaiting an evaluation at [facility] for a possible heart valve replacement.    # Thrombocytopenia platelets 118 most recently in September; unclear etiology. Monitor for now. We'll repeat CBC in 3 weeks.   # Patient will follow-up with me in approximately 6 weeks.    No orders of the defined types were placed in this encounter.   All questions were answered. The patient knows to call the clinic with any problems, questions or concerns. No barriers to learning was detected.  I spent 25 minutes counseling the patient face to face. The total time spent in the appointment was 30 minutes and more than 50% was on counseling and review of test results     Cammie Sickle, MD 11/08/2015 2:04 PM

## 2015-11-23 DIAGNOSIS — I340 Nonrheumatic mitral (valve) insufficiency: Secondary | ICD-10-CM | POA: Insufficient documentation

## 2015-11-23 DIAGNOSIS — I342 Nonrheumatic mitral (valve) stenosis: Secondary | ICD-10-CM | POA: Insufficient documentation

## 2015-11-29 ENCOUNTER — Inpatient Hospital Stay: Payer: Medicare Other

## 2015-11-29 ENCOUNTER — Inpatient Hospital Stay: Payer: Medicare Other | Attending: Internal Medicine

## 2015-11-29 DIAGNOSIS — M109 Gout, unspecified: Secondary | ICD-10-CM | POA: Insufficient documentation

## 2015-11-29 DIAGNOSIS — K219 Gastro-esophageal reflux disease without esophagitis: Secondary | ICD-10-CM | POA: Insufficient documentation

## 2015-11-29 DIAGNOSIS — E785 Hyperlipidemia, unspecified: Secondary | ICD-10-CM | POA: Diagnosis not present

## 2015-11-29 DIAGNOSIS — I739 Peripheral vascular disease, unspecified: Secondary | ICD-10-CM | POA: Diagnosis not present

## 2015-11-29 DIAGNOSIS — I2510 Atherosclerotic heart disease of native coronary artery without angina pectoris: Secondary | ICD-10-CM | POA: Insufficient documentation

## 2015-11-29 DIAGNOSIS — Z79899 Other long term (current) drug therapy: Secondary | ICD-10-CM | POA: Insufficient documentation

## 2015-11-29 DIAGNOSIS — K589 Irritable bowel syndrome without diarrhea: Secondary | ICD-10-CM | POA: Diagnosis not present

## 2015-11-29 DIAGNOSIS — Z87891 Personal history of nicotine dependence: Secondary | ICD-10-CM | POA: Insufficient documentation

## 2015-11-29 DIAGNOSIS — D500 Iron deficiency anemia secondary to blood loss (chronic): Secondary | ICD-10-CM

## 2015-11-29 DIAGNOSIS — J449 Chronic obstructive pulmonary disease, unspecified: Secondary | ICD-10-CM | POA: Insufficient documentation

## 2015-11-29 DIAGNOSIS — I129 Hypertensive chronic kidney disease with stage 1 through stage 4 chronic kidney disease, or unspecified chronic kidney disease: Secondary | ICD-10-CM | POA: Diagnosis present

## 2015-11-29 DIAGNOSIS — I509 Heart failure, unspecified: Secondary | ICD-10-CM | POA: Diagnosis not present

## 2015-11-29 DIAGNOSIS — Z8541 Personal history of malignant neoplasm of cervix uteri: Secondary | ICD-10-CM | POA: Insufficient documentation

## 2015-11-29 DIAGNOSIS — D509 Iron deficiency anemia, unspecified: Secondary | ICD-10-CM | POA: Diagnosis not present

## 2015-11-29 DIAGNOSIS — D631 Anemia in chronic kidney disease: Secondary | ICD-10-CM | POA: Insufficient documentation

## 2015-11-29 DIAGNOSIS — I429 Cardiomyopathy, unspecified: Secondary | ICD-10-CM | POA: Insufficient documentation

## 2015-11-29 DIAGNOSIS — N183 Chronic kidney disease, stage 3 unspecified: Secondary | ICD-10-CM

## 2015-11-29 DIAGNOSIS — D696 Thrombocytopenia, unspecified: Secondary | ICD-10-CM | POA: Insufficient documentation

## 2015-11-29 DIAGNOSIS — E1122 Type 2 diabetes mellitus with diabetic chronic kidney disease: Secondary | ICD-10-CM | POA: Insufficient documentation

## 2015-11-29 LAB — COMPREHENSIVE METABOLIC PANEL
ALT: 16 U/L (ref 14–54)
AST: 23 U/L (ref 15–41)
Albumin: 3.2 g/dL — ABNORMAL LOW (ref 3.5–5.0)
Alkaline Phosphatase: 110 U/L (ref 38–126)
Anion gap: 6 (ref 5–15)
BILIRUBIN TOTAL: 0.7 mg/dL (ref 0.3–1.2)
BUN: 26 mg/dL — AB (ref 6–20)
CO2: 29 mmol/L (ref 22–32)
CREATININE: 1.72 mg/dL — AB (ref 0.44–1.00)
Calcium: 8.6 mg/dL — ABNORMAL LOW (ref 8.9–10.3)
Chloride: 99 mmol/L — ABNORMAL LOW (ref 101–111)
GFR calc Af Amer: 32 mL/min — ABNORMAL LOW (ref >60)
GFR, EST NON AFRICAN AMERICAN: 27 mL/min — AB (ref >60)
Glucose, Bld: 227 mg/dL — ABNORMAL HIGH (ref 65–99)
Potassium: 4.2 mmol/L (ref 3.5–5.1)
Sodium: 134 mmol/L — ABNORMAL LOW (ref 135–145)
TOTAL PROTEIN: 6.8 g/dL (ref 6.5–8.1)

## 2015-11-29 LAB — CBC WITH DIFFERENTIAL/PLATELET
BASOS ABS: 0 10*3/uL (ref 0–0.1)
Basophils Relative: 1 %
Eosinophils Absolute: 0.1 10*3/uL (ref 0–0.7)
Eosinophils Relative: 2 %
HEMATOCRIT: 30.8 % — AB (ref 35.0–47.0)
HEMOGLOBIN: 10.4 g/dL — AB (ref 12.0–16.0)
LYMPHS PCT: 10 %
Lymphs Abs: 0.5 10*3/uL — ABNORMAL LOW (ref 1.0–3.6)
MCH: 30.5 pg (ref 26.0–34.0)
MCHC: 33.8 g/dL (ref 32.0–36.0)
MCV: 90.3 fL (ref 80.0–100.0)
Monocytes Absolute: 0.3 10*3/uL (ref 0.2–0.9)
Monocytes Relative: 7 %
NEUTROS ABS: 3.9 10*3/uL (ref 1.4–6.5)
NEUTROS PCT: 80 %
Platelets: 112 10*3/uL — ABNORMAL LOW (ref 150–440)
RBC: 3.41 MIL/uL — AB (ref 3.80–5.20)
RDW: 17.2 % — ABNORMAL HIGH (ref 11.5–14.5)
WBC: 4.9 10*3/uL (ref 3.6–11.0)

## 2015-11-29 LAB — SAMPLE TO BLOOD BANK

## 2015-11-29 MED ORDER — SODIUM CHLORIDE 0.9 % IV SOLN
200.0000 mg | Freq: Once | INTRAVENOUS | Status: AC
  Administered 2015-11-29: 200 mg via INTRAVENOUS
  Filled 2015-11-29: qty 10

## 2015-11-29 MED ORDER — SODIUM CHLORIDE 0.9 % IJ SOLN
10.0000 mL | INTRAMUSCULAR | Status: DC | PRN
  Administered 2015-11-29: 10 mL
  Filled 2015-11-29: qty 10

## 2015-11-29 MED ORDER — SODIUM CHLORIDE 0.9 % IV SOLN
Freq: Once | INTRAVENOUS | Status: AC
  Administered 2015-11-29: 10:00:00 via INTRAVENOUS
  Filled 2015-11-29: qty 1000

## 2015-11-29 MED ORDER — HEPARIN SOD (PORK) LOCK FLUSH 100 UNIT/ML IV SOLN
500.0000 [IU] | Freq: Once | INTRAVENOUS | Status: AC | PRN
  Administered 2015-11-29: 500 [IU]
  Filled 2015-11-29: qty 5

## 2015-12-19 ENCOUNTER — Ambulatory Visit
Admission: RE | Admit: 2015-12-19 | Discharge: 2015-12-19 | Disposition: A | Discharge status: Home / Self Care | Payer: Medicare Other | Source: Ambulatory Visit | Attending: Internal Medicine | Admitting: Internal Medicine

## 2015-12-19 ENCOUNTER — Encounter
Admission: RE | Disposition: A | Discharge status: Home / Self Care | Payer: Self-pay | Source: Ambulatory Visit | Attending: Internal Medicine

## 2015-12-19 ENCOUNTER — Encounter: Payer: Self-pay | Admitting: *Deleted

## 2015-12-19 DIAGNOSIS — E118 Type 2 diabetes mellitus with unspecified complications: Secondary | ICD-10-CM | POA: Diagnosis not present

## 2015-12-19 DIAGNOSIS — K760 Fatty (change of) liver, not elsewhere classified: Secondary | ICD-10-CM | POA: Insufficient documentation

## 2015-12-19 DIAGNOSIS — I509 Heart failure, unspecified: Secondary | ICD-10-CM | POA: Diagnosis not present

## 2015-12-19 DIAGNOSIS — I2510 Atherosclerotic heart disease of native coronary artery without angina pectoris: Secondary | ICD-10-CM | POA: Diagnosis not present

## 2015-12-19 DIAGNOSIS — Z90710 Acquired absence of both cervix and uterus: Secondary | ICD-10-CM | POA: Insufficient documentation

## 2015-12-19 DIAGNOSIS — Z820 Family history of epilepsy and other diseases of the nervous system: Secondary | ICD-10-CM | POA: Diagnosis not present

## 2015-12-19 DIAGNOSIS — K5520 Angiodysplasia of colon without hemorrhage: Secondary | ICD-10-CM | POA: Insufficient documentation

## 2015-12-19 DIAGNOSIS — M1990 Unspecified osteoarthritis, unspecified site: Secondary | ICD-10-CM | POA: Insufficient documentation

## 2015-12-19 DIAGNOSIS — Z8711 Personal history of peptic ulcer disease: Secondary | ICD-10-CM | POA: Insufficient documentation

## 2015-12-19 DIAGNOSIS — Z87891 Personal history of nicotine dependence: Secondary | ICD-10-CM | POA: Diagnosis not present

## 2015-12-19 DIAGNOSIS — I071 Rheumatic tricuspid insufficiency: Secondary | ICD-10-CM | POA: Insufficient documentation

## 2015-12-19 DIAGNOSIS — K219 Gastro-esophageal reflux disease without esophagitis: Secondary | ICD-10-CM | POA: Diagnosis not present

## 2015-12-19 DIAGNOSIS — Z833 Family history of diabetes mellitus: Secondary | ICD-10-CM | POA: Diagnosis not present

## 2015-12-19 DIAGNOSIS — I340 Nonrheumatic mitral (valve) insufficiency: Secondary | ICD-10-CM | POA: Insufficient documentation

## 2015-12-19 DIAGNOSIS — Z8249 Family history of ischemic heart disease and other diseases of the circulatory system: Secondary | ICD-10-CM | POA: Diagnosis not present

## 2015-12-19 DIAGNOSIS — Z86010 Personal history of colonic polyps: Secondary | ICD-10-CM | POA: Insufficient documentation

## 2015-12-19 DIAGNOSIS — Z825 Family history of asthma and other chronic lower respiratory diseases: Secondary | ICD-10-CM | POA: Diagnosis not present

## 2015-12-19 DIAGNOSIS — Z801 Family history of malignant neoplasm of trachea, bronchus and lung: Secondary | ICD-10-CM | POA: Insufficient documentation

## 2015-12-19 DIAGNOSIS — R0602 Shortness of breath: Secondary | ICD-10-CM | POA: Insufficient documentation

## 2015-12-19 DIAGNOSIS — Z955 Presence of coronary angioplasty implant and graft: Secondary | ICD-10-CM | POA: Insufficient documentation

## 2015-12-19 DIAGNOSIS — Z8541 Personal history of malignant neoplasm of cervix uteri: Secondary | ICD-10-CM | POA: Insufficient documentation

## 2015-12-19 DIAGNOSIS — J449 Chronic obstructive pulmonary disease, unspecified: Secondary | ICD-10-CM | POA: Diagnosis not present

## 2015-12-19 DIAGNOSIS — E785 Hyperlipidemia, unspecified: Secondary | ICD-10-CM | POA: Diagnosis not present

## 2015-12-19 DIAGNOSIS — F419 Anxiety disorder, unspecified: Secondary | ICD-10-CM | POA: Insufficient documentation

## 2015-12-19 DIAGNOSIS — K589 Irritable bowel syndrome without diarrhea: Secondary | ICD-10-CM | POA: Insufficient documentation

## 2015-12-19 DIAGNOSIS — Z9049 Acquired absence of other specified parts of digestive tract: Secondary | ICD-10-CM | POA: Diagnosis not present

## 2015-12-19 DIAGNOSIS — I739 Peripheral vascular disease, unspecified: Secondary | ICD-10-CM | POA: Insufficient documentation

## 2015-12-19 HISTORY — PX: TEE WITHOUT CARDIOVERSION: SHX5443

## 2015-12-19 SURGERY — ECHOCARDIOGRAM, TRANSESOPHAGEAL
Anesthesia: Moderate Sedation

## 2015-12-19 MED ORDER — FENTANYL CITRATE (PF) 100 MCG/2ML IJ SOLN
INTRAMUSCULAR | Status: AC | PRN
  Administered 2015-12-19: 25 ug via INTRAVENOUS
  Administered 2015-12-19: 50 ug via INTRAVENOUS
  Administered 2015-12-19: 25 ug via INTRAVENOUS

## 2015-12-19 MED ORDER — BUTAMBEN-TETRACAINE-BENZOCAINE 2-2-14 % EX AERO
INHALATION_SPRAY | CUTANEOUS | Status: AC
  Filled 2015-12-19: qty 40

## 2015-12-19 MED ORDER — MIDAZOLAM HCL 5 MG/5ML IJ SOLN
INTRAMUSCULAR | Status: AC
  Filled 2015-12-19: qty 5

## 2015-12-19 MED ORDER — FENTANYL CITRATE (PF) 100 MCG/2ML IJ SOLN
INTRAMUSCULAR | Status: AC
  Filled 2015-12-19: qty 4

## 2015-12-19 MED ORDER — HEPARIN SOD (PORK) LOCK FLUSH 100 UNIT/ML IV SOLN
INTRAVENOUS | Status: AC
  Filled 2015-12-19: qty 5

## 2015-12-19 MED ORDER — SODIUM CHLORIDE 0.9 % IV SOLN
INTRAVENOUS | Status: DC
  Administered 2015-12-19: 07:00:00 via INTRAVENOUS

## 2015-12-19 MED ORDER — MIDAZOLAM HCL 2 MG/2ML IJ SOLN
INTRAMUSCULAR | Status: AC | PRN
  Administered 2015-12-19: 1 mg via INTRAVENOUS
  Administered 2015-12-19: 2 mg via INTRAVENOUS
  Administered 2015-12-19: 1 mg via INTRAVENOUS

## 2015-12-19 MED ORDER — LIDOCAINE VISCOUS 2 % MT SOLN
OROMUCOSAL | Status: AC
  Filled 2015-12-19: qty 15

## 2015-12-19 NOTE — Procedures (Signed)
Procedure:  TEE  Indication:  Aortic and Mitral valve disease  Sedation:  4 mg iv versed, 100 mg iv fentanyl; Topical cetacaine spray   After infromed consent, time out protocol, and adequate sedation, tee probe inserted into the posterior pharynx, esophagous and stomach with no obstruction of complications. Images obtained. Probe removed. No immediate complications.   Preliminary Findings:  Aortic valve moderately stenotic with trivial ai Mitral valve anterior leaflet prolapse with moderate to moderately severe mr. No evidence of significant ms.   No thrombus noted.  Agitated saline contrast showed no evidence of intraatrial shunt.   Tricuspid valve intact with mild to moderate tr.

## 2015-12-19 NOTE — Discharge Summary (Signed)
  TEE performed. No immediate complications.

## 2015-12-19 NOTE — Progress Notes (Signed)
*  PRELIMINARY RESULTS* Echocardiogram 2D Echocardiogram has been performed.  Morgan [name] 12/19/2015, 8:28 AM

## 2015-12-20 ENCOUNTER — Encounter: Payer: Self-pay | Admitting: Internal Medicine

## 2015-12-20 ENCOUNTER — Inpatient Hospital Stay: Payer: Medicare Other

## 2015-12-20 ENCOUNTER — Inpatient Hospital Stay (HOSPITAL_BASED_OUTPATIENT_CLINIC_OR_DEPARTMENT_OTHER): Payer: Medicare Other | Admitting: Internal Medicine

## 2015-12-20 VITALS — BP 185/68 | HR 62 | Temp 98.9°F | Resp 22 | Ht 63.0 in | Wt 180.0 lb

## 2015-12-20 DIAGNOSIS — Z79899 Other long term (current) drug therapy: Secondary | ICD-10-CM

## 2015-12-20 DIAGNOSIS — D509 Iron deficiency anemia, unspecified: Secondary | ICD-10-CM

## 2015-12-20 DIAGNOSIS — N183 Chronic kidney disease, stage 3 unspecified: Secondary | ICD-10-CM

## 2015-12-20 DIAGNOSIS — D696 Thrombocytopenia, unspecified: Secondary | ICD-10-CM

## 2015-12-20 DIAGNOSIS — I129 Hypertensive chronic kidney disease with stage 1 through stage 4 chronic kidney disease, or unspecified chronic kidney disease: Secondary | ICD-10-CM

## 2015-12-20 DIAGNOSIS — Z8541 Personal history of malignant neoplasm of cervix uteri: Secondary | ICD-10-CM

## 2015-12-20 DIAGNOSIS — D631 Anemia in chronic kidney disease: Secondary | ICD-10-CM

## 2015-12-20 LAB — COMPREHENSIVE METABOLIC PANEL
ALT: 15 U/L (ref 14–54)
AST: 17 U/L (ref 15–41)
Albumin: 3 g/dL — ABNORMAL LOW (ref 3.5–5.0)
Alkaline Phosphatase: 112 U/L (ref 38–126)
Anion gap: 8 (ref 5–15)
BILIRUBIN TOTAL: 0.7 mg/dL (ref 0.3–1.2)
BUN: 24 mg/dL — AB (ref 6–20)
CHLORIDE: 97 mmol/L — AB (ref 101–111)
CO2: 28 mmol/L (ref 22–32)
CREATININE: 1.78 mg/dL — AB (ref 0.44–1.00)
Calcium: 8.5 mg/dL — ABNORMAL LOW (ref 8.9–10.3)
GFR, EST AFRICAN AMERICAN: 30 mL/min — AB (ref >60)
GFR, EST NON AFRICAN AMERICAN: 26 mL/min — AB (ref >60)
Glucose, Bld: 275 mg/dL — ABNORMAL HIGH (ref 65–99)
Potassium: 3.7 mmol/L (ref 3.5–5.1)
Sodium: 133 mmol/L — ABNORMAL LOW (ref 135–145)
TOTAL PROTEIN: 6.8 g/dL (ref 6.5–8.1)

## 2015-12-20 LAB — CBC WITH DIFFERENTIAL/PLATELET
BASOS ABS: 0.1 10*3/uL (ref 0–0.1)
Basophils Relative: 1 %
EOS PCT: 1 %
Eosinophils Absolute: 0.1 10*3/uL (ref 0–0.7)
HCT: 28.1 % — ABNORMAL LOW (ref 35.0–47.0)
Hemoglobin: 9.6 g/dL — ABNORMAL LOW (ref 12.0–16.0)
LYMPHS ABS: 0.5 10*3/uL — AB (ref 1.0–3.6)
LYMPHS PCT: 7 %
MCH: 31.5 pg (ref 26.0–34.0)
MCHC: 34 g/dL (ref 32.0–36.0)
MCV: 92.6 fL (ref 80.0–100.0)
MONO ABS: 0.5 10*3/uL (ref 0.2–0.9)
Monocytes Relative: 7 %
NEUTROS ABS: 5.8 10*3/uL (ref 1.4–6.5)
Neutrophils Relative %: 84 %
PLATELETS: 153 10*3/uL (ref 150–440)
RBC: 3.04 MIL/uL — ABNORMAL LOW (ref 3.80–5.20)
RDW: 15.4 % — AB (ref 11.5–14.5)
WBC: 7 10*3/uL (ref 3.6–11.0)

## 2015-12-20 LAB — SAMPLE TO BLOOD BANK

## 2015-12-20 NOTE — Progress Notes (Signed)
[name] OFFICE PROGRESS NOTE  Patient Care Team: Kirk Ruths, MD as PCP - General (Internal Medicine) Cammie Sickle, MD as Consulting Physician (Hematology and Oncology)   SUMMARY OF HEMATOLOGIC HISTORY:  # CHRONIC ANEMIA sec Hx GIB/CKD [ CKD STAGE III]; Jan-2016 [colo/EGD- Dr.Elliot]; capsule x2 - angiodysplasia; BMBx- 1999; IV iron & procrit; Declined procrit [sep 2016]; last NOV 2016; Ferrahem]  # CHRONIC CELLULITIS/ AORTIC STENOSIS/ LIMITED MOBILITY   INTERVAL HISTORY:  80 year old female patient with multiple medical problems and above history of chronic anemia from chronic GI blood loss/chronic kidney disease on iron and Procrit is here for follow-up. Patient declined Procrit since September 2016  There has been no significant developments and overall state of health. Morgan [name] was seen at [facility] for consideration for aortic valve replacement. She denies any bleeding, admits to having chronic mild dizziness, denies shortness of breath, chest pain.  REVIEW OF SYSTEMS:  A complete 10 point review of system is done which is negative except mentioned above/history of present illness.   PAST MEDICAL HISTORY :  Past Medical History  Diagnosis Date  . Hypertension   . Cervical cancer ([place])     cervical cancer history  . Iron deficiency anemia 2008  . GI bleed   . History of blood transfusion   . Bilateral cellulitis of lower leg   . CAD (coronary artery disease)   . Fatty liver   . Hyperlipidemia   . GERD (gastroesophageal reflux disease)   . History of nicotine use   . Chronic edema   . Diabetes mellitus with stage 3 chronic kidney disease ([place])   . Trigeminal neuralgia   . Aortic stenosis   . Cardiomyopathy ([place])   . Gout   . Peptic ulcer disease   . Angiodysplasia   . Vascular malformation 06/14/1997  . Adenomatous polyps 03/05/2012  . COPD (chronic obstructive pulmonary disease) ([place])   . CHF (congestive heart failure) ([place])   .  Fibrocystic breast changes   . Irritable bowel syndrome   . Arthritis   . Carotid bruit   . Tobacco abuse   . Osteoarthritis   . Macrocytosis   . Abnormal LFTs (liver function tests)   . PVD (peripheral vascular disease) ([place])   . AV malformation of GI tract   . Clostridium difficile infection March 02, 2015    PAST SURGICAL HISTORY :   Past Surgical History  Procedure Laterality Date  . Abdominal hysterectomy    . Bone marrow biopsy  1999  . Angiogram cardiac stenting    . Thoracentesis    . Esophagogastroduodenoscopy  12/2014, 2014, 2013, 2009, 2004  . Colonoscopy  12/2014, 2014, 2013, 2008, 2004    03/05/2012-Adenomatous Polyps;09/23/2003-Polyp remains intact;   . Cholecystectomy    . Tonsillectomy    . Nasal sinus surgery    . Endoscopic carpal tunnel release      FAMILY HISTORY :   Family History  Problem Relation Age of Onset  . Parkinsonism Mother   . Heart attack Mother   . Lung cancer Father   . Diabetes Mellitus II Father   . Asthma Sister   . Asthma Brother   . Heart disease Son     SOCIAL HISTORY:   Social History  Substance Use Topics  . Smoking status: Former Smoker -- 0.50 packs/day for 56 years    Types: Cigarettes    Quit date: 05/23/2013  . Smokeless tobacco: Never Used  . Alcohol Use: No  ALLERGIES:  is allergic to aspirin; atenolol; clopidogrel bisulfate; darvon; hydrochlorothiazide; iodinated diagnostic agents; nylon; other; and pravastatin.  MEDICATIONS:  No current outpatient prescriptions on file.   No current facility-administered medications for this visit.    PHYSICAL EXAMINATION: ECOG PERFORMANCE STATUS: 2 - Symptomatic, <50% confined to bed  There were no vitals taken for this visit.  There were no vitals filed for this visit.  GENERAL: Well-nourished well-developed; Alert, no distress and comfortable.   She is accompanied by her brother. She is in a wheelchair. She cannot sit on the exam table.  EYES: no pallor or  icterus OROPHARYNX: no thrush or ulceration; good dentition  NECK: supple, no masses felt LYMPH:  no palpable lymphadenopathy in the cervical,  LUNGS: clear to auscultation and  No wheeze or crackles HEART/CVS: regular rate & rhythm and positive for systolic ejection murmur on aorta, mitral regurgitation murmur;  ABDOMEN:abdomen soft, non-tender and normal bowel sounds Musculoskeletal:no cyanosis of digits and no clubbing  PSYCH: alert & oriented x 3 with fluent speech NEURO: no focal motor/sensory deficits SKIN:  erythema of the left lower extremity /improving.   LABORATORY DATA:  I have reviewed the data as listed    Component Value Date/Time   NA 134* 11/29/2015 0946   NA 139 01/18/2015 0545   K 4.2 11/29/2015 0946   K 3.9 01/18/2015 0545   CL 99* 11/29/2015 0946   CL 105 01/18/2015 0545   CO2 29 11/29/2015 0946   CO2 25 01/18/2015 0545   GLUCOSE 227* 11/29/2015 0946   GLUCOSE 179* 01/18/2015 0545   BUN 26* 11/29/2015 0946   BUN 29* 01/18/2015 0545   CREATININE 1.72* 11/29/2015 0946   CREATININE 1.80* 01/18/2015 0545   CALCIUM 8.6* 11/29/2015 0946   CALCIUM 8.7 01/18/2015 0545   PROT 6.8 11/29/2015 0946   PROT 6.6 06/20/2014 1101   ALBUMIN 3.2* 11/29/2015 0946   ALBUMIN 3.1* 06/20/2014 1101   AST 23 11/29/2015 0946   AST 32 06/20/2014 1101   ALT 16 11/29/2015 0946   ALT 31 06/20/2014 1101   ALKPHOS 110 11/29/2015 0946   ALKPHOS 125* 06/20/2014 1101   BILITOT 0.7 11/29/2015 0946   BILITOT 0.3 06/20/2014 1101   GFRNONAA 27* 11/29/2015 0946   GFRNONAA 29* 01/18/2015 0545   GFRNONAA 28* 06/20/2014 1101   GFRAA 32* 11/29/2015 0946   GFRAA 35* 01/18/2015 0545   GFRAA 33* 06/20/2014 1101    No results found for: SPEP, UPEP  Lab Results  Component Value Date   WBC 7.0 12/20/2015   NEUTROABS 5.8 12/20/2015   HGB 9.6* 12/20/2015   HCT 28.1* 12/20/2015   MCV 92.6 12/20/2015   PLT 153 12/20/2015      Chemistry      Component Value Date/Time   NA 134*  11/29/2015 0946   NA 139 01/18/2015 0545   K 4.2 11/29/2015 0946   K 3.9 01/18/2015 0545   CL 99* 11/29/2015 0946   CL 105 01/18/2015 0545   CO2 29 11/29/2015 0946   CO2 25 01/18/2015 0545   BUN 26* 11/29/2015 0946   BUN 29* 01/18/2015 0545   CREATININE 1.72* 11/29/2015 0946   CREATININE 1.80* 01/18/2015 0545      Component Value Date/Time   CALCIUM 8.6* 11/29/2015 0946   CALCIUM 8.7 01/18/2015 0545   ALKPHOS 110 11/29/2015 0946   ALKPHOS 125* 06/20/2014 1101   AST 23 11/29/2015 0946   AST 32 06/20/2014 1101   ALT 16 11/29/2015 0946   ALT  31 06/20/2014 1101   BILITOT 0.7 11/29/2015 0946   BILITOT 0.3 06/20/2014 1101       RADIOGRAPHIC STUDIES: I have personally reviewed the radiological images as listed and agreed with the findings in the report. No results found.   ASSESSMENT & PLAN:   # CHRONIC ANEMIA- secondary to GI bleed/AV malformations; iron deficiency and chronic kidney disease.  Patient status post IV iron  November 2016 Feraheme 2.  Hemoglobin has declined somewhat, but still within acceptable range. MCV is within normal range. Patient does not have any evidence of ongoing GI bleeding. We will recheck her hematologic parameters and ferritin in one month, and decide on whether she requires any intravenous iron supplementation at that point. Patient continues to decline ESA, but at this point hemoglobin is exceeding the threshold for administration of ESA anyway.  # Will check CBC in approximately 3 weeks; IV Venofer if needed at that time. Follow-up with me in 6 weeks.  # Aortic stenosis-patient was seen at [facility], and is in the process of workup for valve replacement. As noted, severe aortic stenosis can contribute to anemia by means of direct destruction of red blood cells due to shear forces  # Thrombocytopenia resolved. Platelet count is normal today  # Patient will follow-up with M.D. in 2 months   No orders of the defined types were placed in this  encounter.   All questions were answered. The patient knows to call the clinic with any problems, questions or concerns. No barriers to learning was detected.  I spent 25 minutes counseling the patient face to face. The total time spent in the appointment was 30 minutes and more than 50% was on counseling and review of test results     Morgan Hires, MD 12/20/2015 9:59 AM

## 2015-12-20 NOTE — Progress Notes (Signed)
The patient has been evaluated by cardiology at [facility]. She had a TEE performed yesterday by Dr. Ubaldo Glassing. She states that she will continue to followup with her cardiologist and primary medical doctor.  She states that she will be referred to Dr. Evelina Dun at [facility] for further evaluate the aortic valve and mitral valve problems. The patient will see Dr. Evelina Dun to be considered for a transcatheter aortic valve replacement.    She denies any GI bleed at the present time.

## 2016-01-08 ENCOUNTER — Telehealth: Payer: Self-pay | Admitting: *Deleted

## 2016-01-08 DIAGNOSIS — D509 Iron deficiency anemia, unspecified: Secondary | ICD-10-CM

## 2016-01-08 NOTE — Telephone Encounter (Signed)
Per Dr Rogue Bussing, pt to come in for CBC and hold tube, she agrees to appt tomorrow at 10:00

## 2016-01-08 NOTE — Telephone Encounter (Addendum)
Reports feeling more tired, SOB with exertion, and light headed. Asking if she can have her labs checked

## 2016-01-09 ENCOUNTER — Inpatient Hospital Stay: Payer: Medicare Other | Attending: Internal Medicine

## 2016-01-09 DIAGNOSIS — N183 Chronic kidney disease, stage 3 (moderate): Secondary | ICD-10-CM | POA: Insufficient documentation

## 2016-01-09 DIAGNOSIS — Z79899 Other long term (current) drug therapy: Secondary | ICD-10-CM | POA: Diagnosis not present

## 2016-01-09 DIAGNOSIS — D696 Thrombocytopenia, unspecified: Secondary | ICD-10-CM | POA: Insufficient documentation

## 2016-01-09 DIAGNOSIS — Z8541 Personal history of malignant neoplasm of cervix uteri: Secondary | ICD-10-CM | POA: Diagnosis not present

## 2016-01-09 DIAGNOSIS — D509 Iron deficiency anemia, unspecified: Secondary | ICD-10-CM

## 2016-01-09 DIAGNOSIS — D631 Anemia in chronic kidney disease: Secondary | ICD-10-CM | POA: Insufficient documentation

## 2016-01-09 DIAGNOSIS — I129 Hypertensive chronic kidney disease with stage 1 through stage 4 chronic kidney disease, or unspecified chronic kidney disease: Secondary | ICD-10-CM | POA: Diagnosis not present

## 2016-01-09 LAB — CBC WITH DIFFERENTIAL/PLATELET
Basophils Absolute: 0 10*3/uL (ref 0–0.1)
Basophils Relative: 1 %
Eosinophils Absolute: 0.1 10*3/uL (ref 0–0.7)
Eosinophils Relative: 2 %
HEMATOCRIT: 29.3 % — AB (ref 35.0–47.0)
Hemoglobin: 9.9 g/dL — ABNORMAL LOW (ref 12.0–16.0)
LYMPHS PCT: 8 %
Lymphs Abs: 0.5 10*3/uL — ABNORMAL LOW (ref 1.0–3.6)
MCH: 30.8 pg (ref 26.0–34.0)
MCHC: 33.7 g/dL (ref 32.0–36.0)
MCV: 91.6 fL (ref 80.0–100.0)
MONO ABS: 0.4 10*3/uL (ref 0.2–0.9)
Monocytes Relative: 6 %
NEUTROS ABS: 5.3 10*3/uL (ref 1.4–6.5)
Neutrophils Relative %: 83 %
PLATELETS: 134 10*3/uL — AB (ref 150–440)
RBC: 3.2 MIL/uL — ABNORMAL LOW (ref 3.80–5.20)
RDW: 14.1 % (ref 11.5–14.5)
WBC: 6.4 10*3/uL (ref 3.6–11.0)

## 2016-01-09 LAB — SAMPLE TO BLOOD BANK

## 2016-01-10 NOTE — Telephone Encounter (Signed)
Pt informed that no iron infusion is needed for yesterday. Labs are stable.

## 2016-01-19 ENCOUNTER — Inpatient Hospital Stay: Payer: Medicare Other

## 2016-01-19 ENCOUNTER — Other Ambulatory Visit: Payer: Self-pay | Admitting: *Deleted

## 2016-01-19 DIAGNOSIS — I129 Hypertensive chronic kidney disease with stage 1 through stage 4 chronic kidney disease, or unspecified chronic kidney disease: Secondary | ICD-10-CM | POA: Diagnosis not present

## 2016-01-19 DIAGNOSIS — Z98818 Other dental procedure status: Secondary | ICD-10-CM

## 2016-01-19 DIAGNOSIS — D500 Iron deficiency anemia secondary to blood loss (chronic): Secondary | ICD-10-CM

## 2016-01-19 DIAGNOSIS — D509 Iron deficiency anemia, unspecified: Secondary | ICD-10-CM

## 2016-01-19 LAB — CBC WITH DIFFERENTIAL/PLATELET
BASOS ABS: 0.1 10*3/uL (ref 0–0.1)
BASOS PCT: 1 %
Eosinophils Absolute: 0.1 10*3/uL (ref 0–0.7)
Eosinophils Relative: 1 %
HEMATOCRIT: 29.2 % — AB (ref 35.0–47.0)
HEMOGLOBIN: 9.9 g/dL — AB (ref 12.0–16.0)
Lymphocytes Relative: 7 %
Lymphs Abs: 0.4 10*3/uL — ABNORMAL LOW (ref 1.0–3.6)
MCH: 31.5 pg (ref 26.0–34.0)
MCHC: 34.1 g/dL (ref 32.0–36.0)
MCV: 92.3 fL (ref 80.0–100.0)
MONOS PCT: 6 %
Monocytes Absolute: 0.3 10*3/uL (ref 0.2–0.9)
NEUTROS ABS: 5.1 10*3/uL (ref 1.4–6.5)
NEUTROS PCT: 85 %
Platelets: 137 10*3/uL — ABNORMAL LOW (ref 150–440)
RBC: 3.16 MIL/uL — AB (ref 3.80–5.20)
RDW: 13.9 % (ref 11.5–14.5)
WBC: 6 10*3/uL (ref 3.6–11.0)

## 2016-01-19 LAB — COMPREHENSIVE METABOLIC PANEL
ALBUMIN: 3.1 g/dL — AB (ref 3.5–5.0)
ALK PHOS: 115 U/L (ref 38–126)
ALT: 15 U/L (ref 14–54)
AST: 20 U/L (ref 15–41)
Anion gap: 10 (ref 5–15)
BILIRUBIN TOTAL: 0.6 mg/dL (ref 0.3–1.2)
BUN: 16 mg/dL (ref 6–20)
CALCIUM: 8.2 mg/dL — AB (ref 8.9–10.3)
CO2: 27 mmol/L (ref 22–32)
CREATININE: 1.49 mg/dL — AB (ref 0.44–1.00)
Chloride: 98 mmol/L — ABNORMAL LOW (ref 101–111)
GFR calc Af Amer: 38 mL/min — ABNORMAL LOW (ref >60)
GFR calc non Af Amer: 32 mL/min — ABNORMAL LOW (ref >60)
GLUCOSE: 241 mg/dL — AB (ref 65–99)
Potassium: 3.7 mmol/L (ref 3.5–5.1)
Sodium: 135 mmol/L (ref 135–145)
TOTAL PROTEIN: 7.1 g/dL (ref 6.5–8.1)

## 2016-01-19 LAB — PROTIME-INR
INR: 1.12
Prothrombin Time: 14.6 seconds (ref 11.4–15.0)

## 2016-01-19 LAB — APTT: aPTT: 30 seconds (ref 24–36)

## 2016-01-19 LAB — SAMPLE TO BLOOD BANK

## 2016-01-19 LAB — FERRITIN: FERRITIN: 88 ng/mL (ref 11–307)

## 2016-01-19 NOTE — Progress Notes (Signed)
Pt came with lab requisition for [facility]. Requested cbc, metc, pt, ptt to be ordered for patient given h/o IDA and bleeding h/o. I spoke with Dr. Rogue Bussing, he gave a verbal order to be added these to the patient's lab draw today under Dr. Aletha Halim name. Results can be faxed to [facility].

## 2016-01-19 NOTE — Progress Notes (Unsigned)
Patient states she does not want to wait for ferritin level to come back and requesting if level is low to reschedule her visit next week.  Dr. Rogue Bussing aware.  Pt left without ferritin level back.

## 2016-01-23 ENCOUNTER — Telehealth: Payer: Self-pay | Admitting: *Deleted

## 2016-01-23 NOTE — Telephone Encounter (Signed)
Dr. Rogue Bussing, Please review patient's labs. Pt did not stay for her lab results to determine whether an IV iron infusion was needed. Her ferritin level was 88 and hgb was stable at 9.9.  Please let me know if patient needs iron. Pt waiting on call back.

## 2016-01-24 NOTE — Telephone Encounter (Signed)
Per Dr. Rogue Bussing, no IV iron needed. Pt needs to keep her appointments in March as directed.   RN called patient. Pt made aware.

## 2016-02-06 ENCOUNTER — Telehealth: Payer: Self-pay | Admitting: *Deleted

## 2016-02-06 DIAGNOSIS — D509 Iron deficiency anemia, unspecified: Secondary | ICD-10-CM

## 2016-02-06 NOTE — Telephone Encounter (Signed)
After discussing with L Herring, AGNP-C patient was advised to go to [facility] or to contact her PCP. She is resistant to this plan and thinks she needs iron infusion because her Ferritin on 1/27 was 88. She di dtell me she would call her PCP or cardiologist whist I asked about whether she needs iron infusion

## 2016-02-06 NOTE — Addendum Note (Signed)
Addended by: Sabino Gasser on: 02/06/2016 12:11 PM   Modules accepted: Orders

## 2016-02-06 NOTE — Telephone Encounter (Signed)
Per Dr Rogue Bussing no iron infusion but will check CBC. Pt refuses to come today or tomorrow, but requests to come Thursday for lab/ port flush. Appt for 1030 2/16 agreed on Pt advised to call PCP and she has agreed to call Dr Ouida Sills

## 2016-02-08 ENCOUNTER — Inpatient Hospital Stay: Payer: Medicare Other | Attending: Internal Medicine

## 2016-02-08 ENCOUNTER — Inpatient Hospital Stay: Payer: Medicare Other

## 2016-02-08 DIAGNOSIS — I129 Hypertensive chronic kidney disease with stage 1 through stage 4 chronic kidney disease, or unspecified chronic kidney disease: Secondary | ICD-10-CM | POA: Diagnosis present

## 2016-02-08 DIAGNOSIS — Z79899 Other long term (current) drug therapy: Secondary | ICD-10-CM | POA: Diagnosis not present

## 2016-02-08 DIAGNOSIS — D509 Iron deficiency anemia, unspecified: Secondary | ICD-10-CM

## 2016-02-08 DIAGNOSIS — Z8541 Personal history of malignant neoplasm of cervix uteri: Secondary | ICD-10-CM | POA: Diagnosis not present

## 2016-02-08 DIAGNOSIS — N183 Chronic kidney disease, stage 3 (moderate): Secondary | ICD-10-CM | POA: Diagnosis not present

## 2016-02-08 DIAGNOSIS — D631 Anemia in chronic kidney disease: Secondary | ICD-10-CM | POA: Diagnosis not present

## 2016-02-08 LAB — CBC WITH DIFFERENTIAL/PLATELET
Basophils Absolute: 0 10*3/uL (ref 0–0.1)
Basophils Relative: 1 %
EOS ABS: 0.1 10*3/uL (ref 0–0.7)
EOS PCT: 1 %
HCT: 29.5 % — ABNORMAL LOW (ref 35.0–47.0)
Hemoglobin: 10 g/dL — ABNORMAL LOW (ref 12.0–16.0)
LYMPHS ABS: 0.4 10*3/uL — AB (ref 1.0–3.6)
LYMPHS PCT: 7 %
MCH: 31.2 pg (ref 26.0–34.0)
MCHC: 33.9 g/dL (ref 32.0–36.0)
MCV: 92 fL (ref 80.0–100.0)
MONO ABS: 0.5 10*3/uL (ref 0.2–0.9)
MONOS PCT: 8 %
Neutro Abs: 4.8 10*3/uL (ref 1.4–6.5)
Neutrophils Relative %: 83 %
PLATELETS: 113 10*3/uL — AB (ref 150–440)
RBC: 3.21 MIL/uL — ABNORMAL LOW (ref 3.80–5.20)
RDW: 14.2 % (ref 11.5–14.5)
WBC: 5.8 10*3/uL (ref 3.6–11.0)

## 2016-02-08 LAB — SAMPLE TO BLOOD BANK

## 2016-02-08 MED ORDER — HEPARIN SOD (PORK) LOCK FLUSH 100 UNIT/ML IV SOLN
500.0000 [IU] | Freq: Once | INTRAVENOUS | Status: AC
  Administered 2016-02-08: 500 [IU] via INTRAVENOUS
  Filled 2016-02-08: qty 5

## 2016-02-08 MED ORDER — SODIUM CHLORIDE 0.9% FLUSH
10.0000 mL | INTRAVENOUS | Status: DC | PRN
  Administered 2016-02-08: 10 mL via INTRAVENOUS
  Filled 2016-02-08: qty 10

## 2016-02-19 ENCOUNTER — Other Ambulatory Visit: Payer: Self-pay | Admitting: Internal Medicine

## 2016-02-19 ENCOUNTER — Other Ambulatory Visit
Admission: RE | Admit: 2016-02-19 | Discharge: 2016-02-19 | Disposition: A | Discharge status: Home / Self Care | Payer: Medicare Other | Source: Ambulatory Visit | Attending: Internal Medicine | Admitting: Internal Medicine

## 2016-02-19 ENCOUNTER — Ambulatory Visit
Admission: RE | Admit: 2016-02-19 | Discharge: 2016-02-19 | Disposition: A | Discharge status: Home / Self Care | Payer: Medicare Other | Source: Ambulatory Visit | Attending: Internal Medicine | Admitting: Internal Medicine

## 2016-02-19 DIAGNOSIS — M7989 Other specified soft tissue disorders: Secondary | ICD-10-CM | POA: Diagnosis not present

## 2016-02-19 LAB — FIBRIN DERIVATIVES D-DIMER (ARMC ONLY): FIBRIN DERIVATIVES D-DIMER (ARMC): 1309 — AB (ref 0–499)

## 2016-02-20 IMAGING — CT CT ABDOMEN W/O CM
2 of 4 series · 16 of 46 positions shown, 18 images · non-contrast
Comparison: None.

CLINICAL DATA: Abdominal pain, GI bleeding

EXAM:
CT ABDOMEN WITHOUT CONTRAST
TECHNIQUE: Multidetector CT imaging of the abdomen was performed following the
standard protocol without IV contrast.

[Series 2: routine abd pel without · axial · non-contrast · 0.77mm/px · z∈[+458,+668]mm · 13 of 46 slices shown, 15 images]
[im 2/46  soft-tissue]
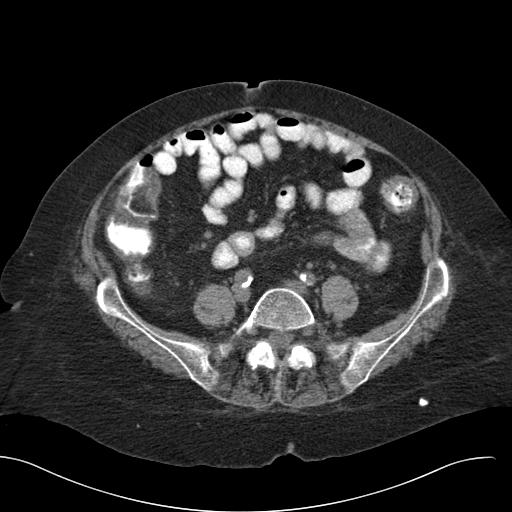
[im 2/46  bone]
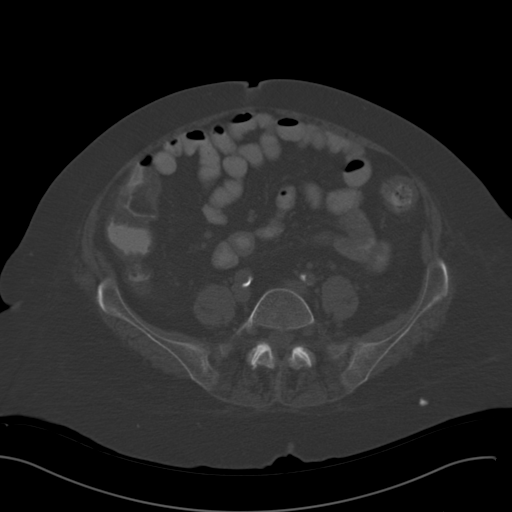
[im 6/46  soft-tissue]
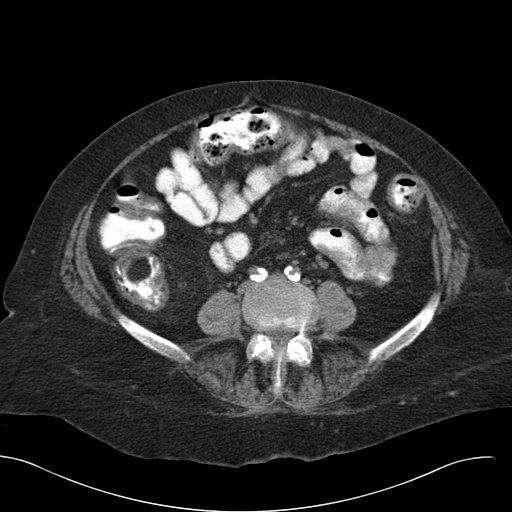
[im 10/46  soft-tissue]
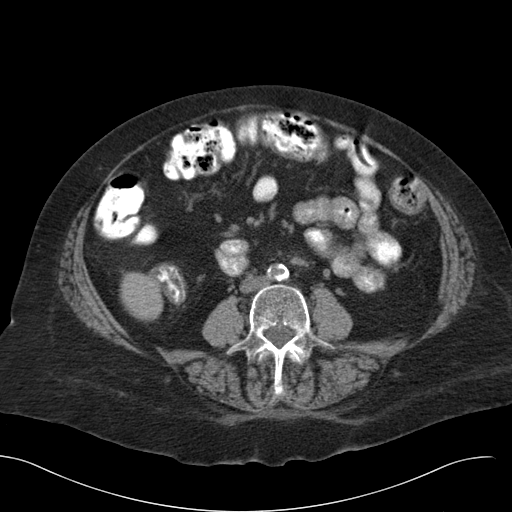
[im 12/46  soft-tissue]
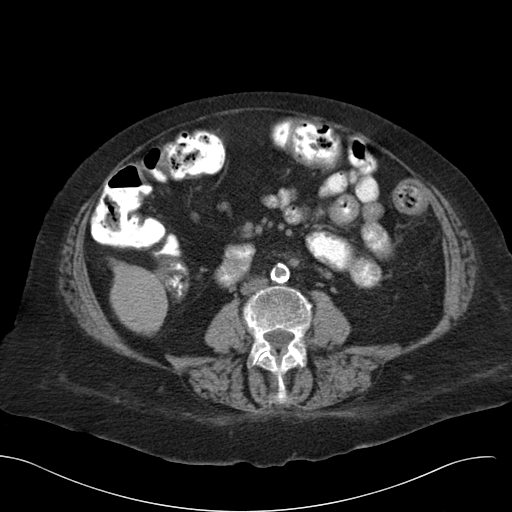
[im 16/46  soft-tissue]
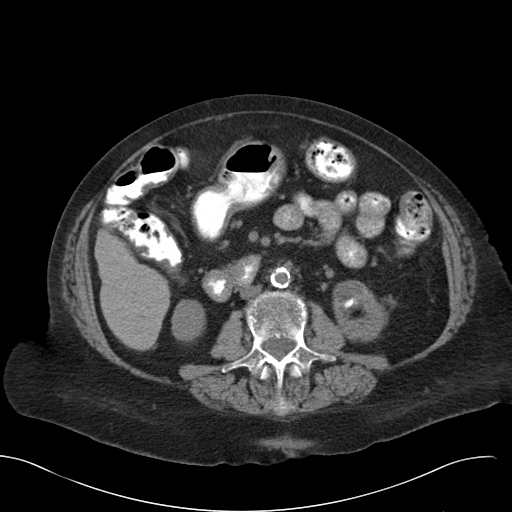
[im 20/46  soft-tissue]
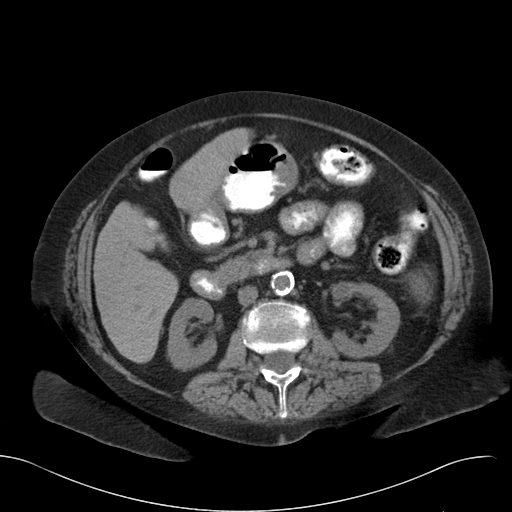
[im 24/46  soft-tissue]
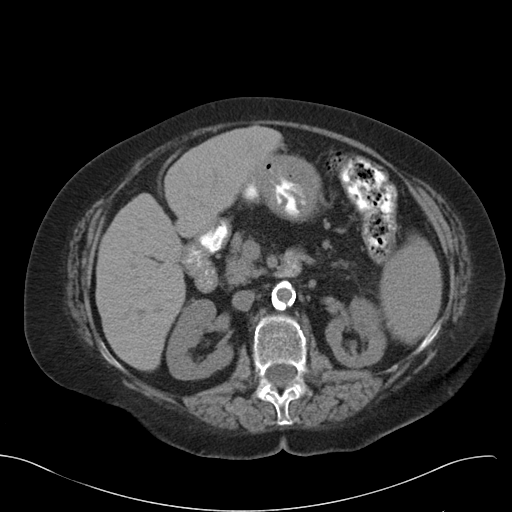
[im 26/46  soft-tissue]
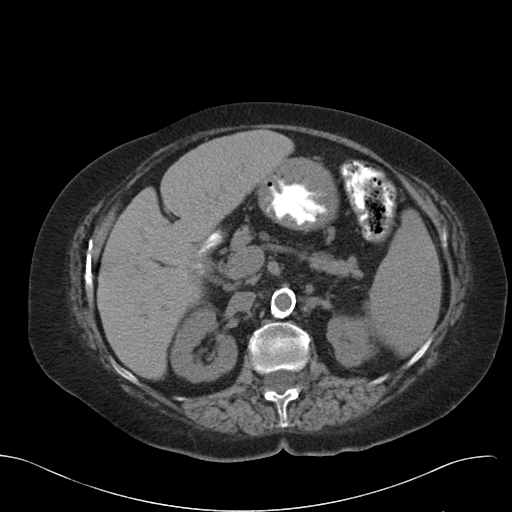
[im 30/46  soft-tissue]
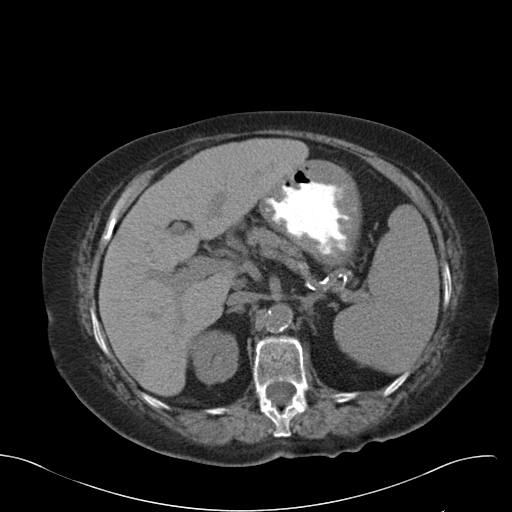
[im 30/46  bone]
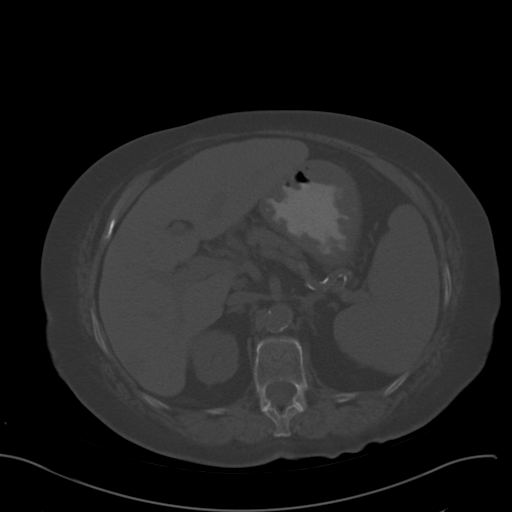
[im 34/46  soft-tissue]
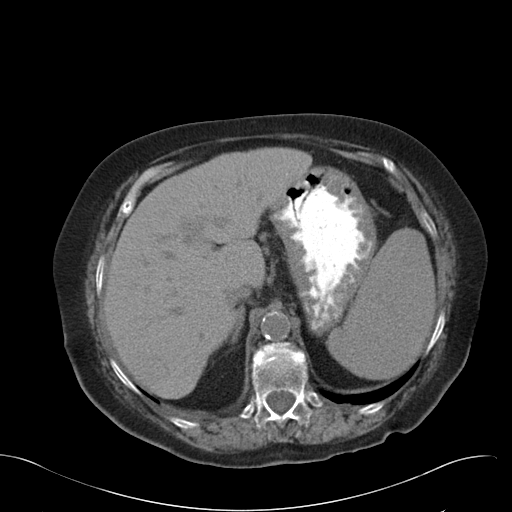
[im 36/46  soft-tissue]
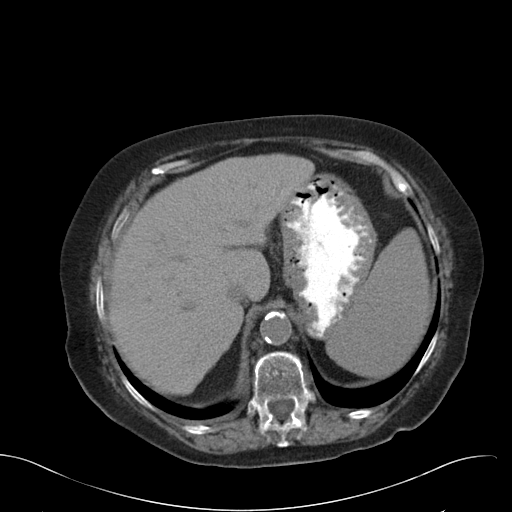
[im 40/46  soft-tissue]
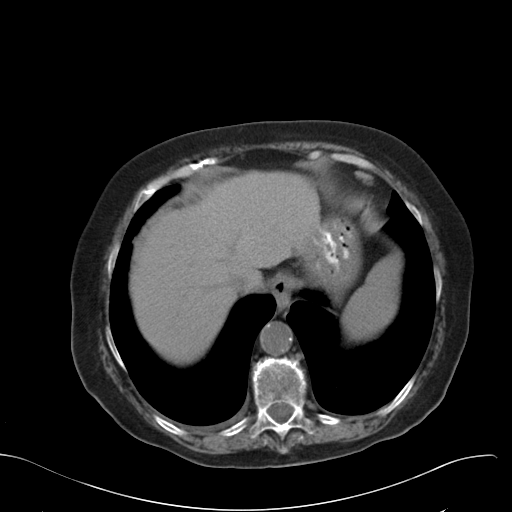
[im 44/46  soft-tissue]
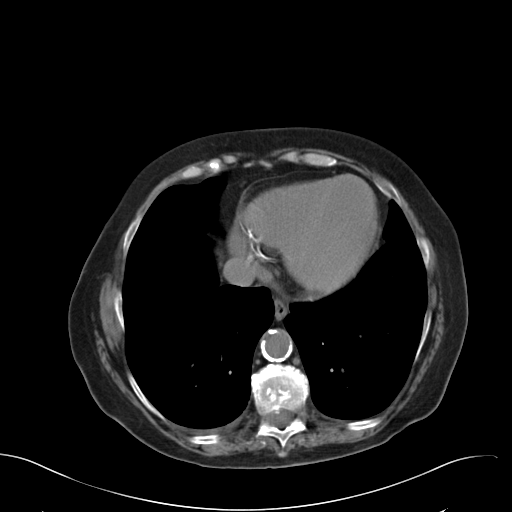

[Series 5: cor routine abd pel wo · coronal · 0.47mm/px · 3 of 141 slices shown]
[im 47/141  soft-tissue]
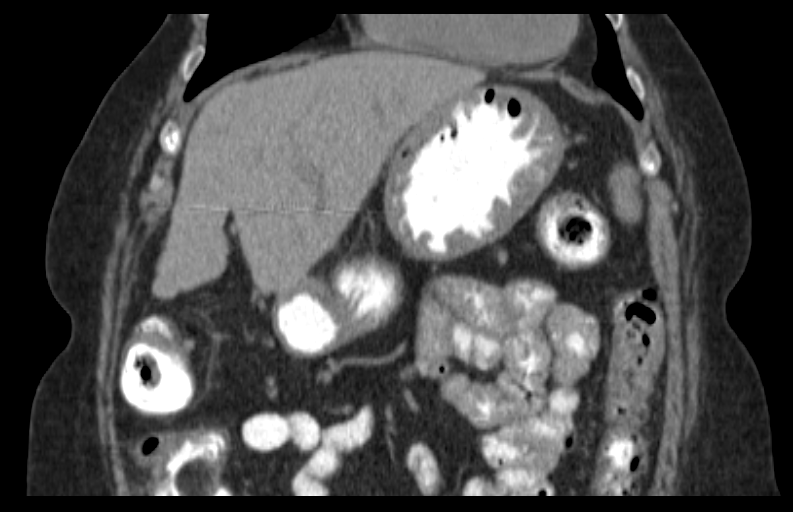
[im 63/141  soft-tissue]
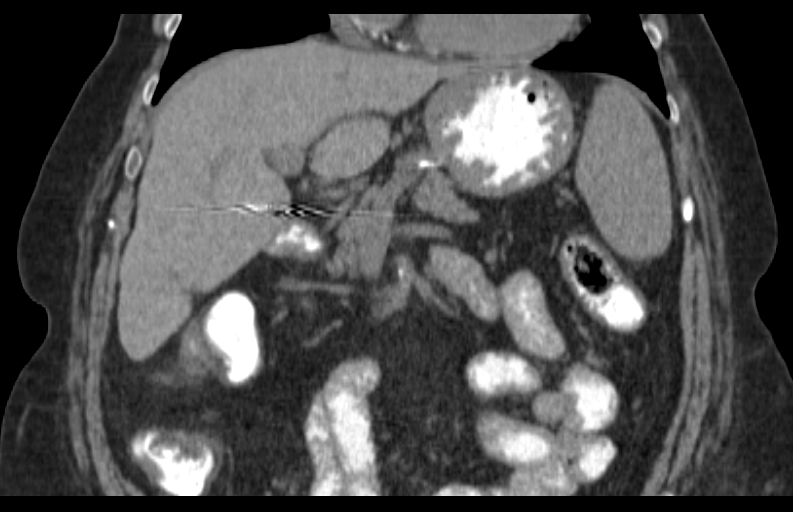
[im 78/141  soft-tissue]
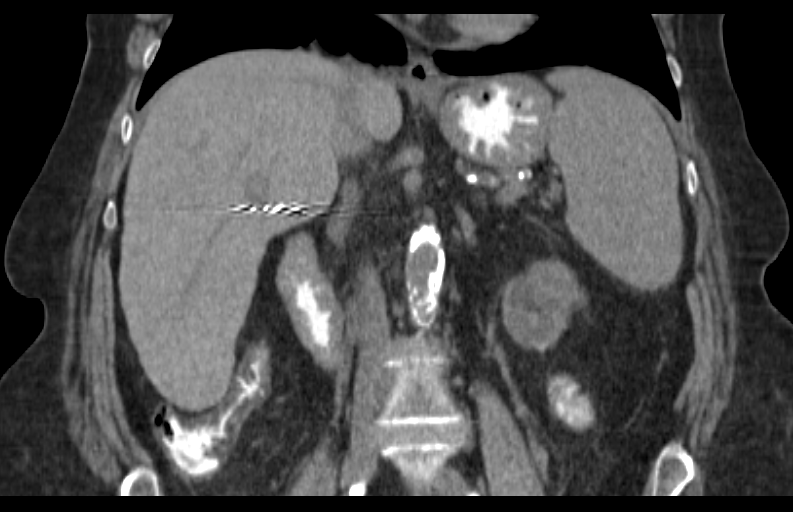

[16 of 46 positions shown; findings below may reference images not displayed]

FINDINGS: Lung bases are essentially clear.

Unenhanced liver is notable for a mildly nodular hepatic contour,
suggesting cirrhosis.

Spleen is within normal limits.

Pancreas and adrenal glands are unremarkable.

Status post cholecystectomy. No intrahepatic or extrahepatic ductal
dilatation.

14 mm nonobstructing left lower pole renal calculus. Right kidney is
unremarkable. No hydronephrosis.

Atherosclerotic calcifications of the abdominal aorta and branch
vessels.

No abdominal ascites.

No suspicious abdominal lymphadenopathy.

Visualized osseous structures are within normal limits.
IMPRESSION: 14 mm nonobstructing left lower pole renal calculus. No
hydronephrosis.

Nodular hepatic contour, suggesting cirrhosis.

No CT findings to account for the patient's abdominal pain.

Please note that the pelvis was not imaged.

## 2016-03-15 ENCOUNTER — Other Ambulatory Visit: Payer: Self-pay | Admitting: *Deleted

## 2016-03-15 DIAGNOSIS — D509 Iron deficiency anemia, unspecified: Secondary | ICD-10-CM

## 2016-03-18 ENCOUNTER — Inpatient Hospital Stay (HOSPITAL_BASED_OUTPATIENT_CLINIC_OR_DEPARTMENT_OTHER): Payer: Medicare Other | Admitting: Internal Medicine

## 2016-03-18 ENCOUNTER — Inpatient Hospital Stay: Payer: Medicare Other

## 2016-03-18 ENCOUNTER — Other Ambulatory Visit: Payer: Self-pay | Admitting: Internal Medicine

## 2016-03-18 ENCOUNTER — Other Ambulatory Visit: Payer: Self-pay | Admitting: *Deleted

## 2016-03-18 ENCOUNTER — Inpatient Hospital Stay: Payer: Medicare Other | Attending: Internal Medicine

## 2016-03-18 VITALS — BP 154/57 | HR 72 | Temp 96.8°F | Resp 18 | Wt 181.0 lb

## 2016-03-18 DIAGNOSIS — N183 Chronic kidney disease, stage 3 unspecified: Secondary | ICD-10-CM

## 2016-03-18 DIAGNOSIS — D696 Thrombocytopenia, unspecified: Secondary | ICD-10-CM | POA: Diagnosis not present

## 2016-03-18 DIAGNOSIS — E785 Hyperlipidemia, unspecified: Secondary | ICD-10-CM | POA: Diagnosis not present

## 2016-03-18 DIAGNOSIS — D509 Iron deficiency anemia, unspecified: Secondary | ICD-10-CM

## 2016-03-18 DIAGNOSIS — Z87891 Personal history of nicotine dependence: Secondary | ICD-10-CM | POA: Diagnosis not present

## 2016-03-18 DIAGNOSIS — I2510 Atherosclerotic heart disease of native coronary artery without angina pectoris: Secondary | ICD-10-CM | POA: Insufficient documentation

## 2016-03-18 DIAGNOSIS — Z79899 Other long term (current) drug therapy: Secondary | ICD-10-CM | POA: Insufficient documentation

## 2016-03-18 DIAGNOSIS — D631 Anemia in chronic kidney disease: Secondary | ICD-10-CM

## 2016-03-18 DIAGNOSIS — K589 Irritable bowel syndrome without diarrhea: Secondary | ICD-10-CM | POA: Diagnosis not present

## 2016-03-18 DIAGNOSIS — I739 Peripheral vascular disease, unspecified: Secondary | ICD-10-CM | POA: Diagnosis not present

## 2016-03-18 DIAGNOSIS — K219 Gastro-esophageal reflux disease without esophagitis: Secondary | ICD-10-CM | POA: Diagnosis not present

## 2016-03-18 DIAGNOSIS — Z955 Presence of coronary angioplasty implant and graft: Secondary | ICD-10-CM | POA: Diagnosis not present

## 2016-03-18 DIAGNOSIS — J449 Chronic obstructive pulmonary disease, unspecified: Secondary | ICD-10-CM | POA: Insufficient documentation

## 2016-03-18 DIAGNOSIS — D500 Iron deficiency anemia secondary to blood loss (chronic): Secondary | ICD-10-CM

## 2016-03-18 DIAGNOSIS — M1990 Unspecified osteoarthritis, unspecified site: Secondary | ICD-10-CM | POA: Insufficient documentation

## 2016-03-18 DIAGNOSIS — I129 Hypertensive chronic kidney disease with stage 1 through stage 4 chronic kidney disease, or unspecified chronic kidney disease: Secondary | ICD-10-CM | POA: Diagnosis not present

## 2016-03-18 DIAGNOSIS — N189 Chronic kidney disease, unspecified: Principal | ICD-10-CM

## 2016-03-18 DIAGNOSIS — M109 Gout, unspecified: Secondary | ICD-10-CM | POA: Diagnosis not present

## 2016-03-18 DIAGNOSIS — I509 Heart failure, unspecified: Secondary | ICD-10-CM | POA: Diagnosis not present

## 2016-03-18 DIAGNOSIS — C801 Malignant (primary) neoplasm, unspecified: Secondary | ICD-10-CM

## 2016-03-18 DIAGNOSIS — E1122 Type 2 diabetes mellitus with diabetic chronic kidney disease: Secondary | ICD-10-CM | POA: Insufficient documentation

## 2016-03-18 LAB — CBC WITH DIFFERENTIAL/PLATELET
BASOS ABS: 0.1 10*3/uL (ref 0–0.1)
Basophils Relative: 1 %
Eosinophils Absolute: 0.1 10*3/uL (ref 0–0.7)
Eosinophils Relative: 1 %
HEMATOCRIT: 18.5 % — AB (ref 35.0–47.0)
Hemoglobin: 6.3 g/dL — ABNORMAL LOW (ref 12.0–16.0)
LYMPHS PCT: 9 %
Lymphs Abs: 0.5 10*3/uL — ABNORMAL LOW (ref 1.0–3.6)
MCH: 30.4 pg (ref 26.0–34.0)
MCHC: 34.3 g/dL (ref 32.0–36.0)
MCV: 88.6 fL (ref 80.0–100.0)
Monocytes Absolute: 0.4 10*3/uL (ref 0.2–0.9)
Monocytes Relative: 7 %
NEUTROS ABS: 4.8 10*3/uL (ref 1.4–6.5)
NEUTROS PCT: 82 %
Platelets: 122 10*3/uL — ABNORMAL LOW (ref 150–440)
RBC: 2.09 MIL/uL — AB (ref 3.80–5.20)
RDW: 14.1 % (ref 11.5–14.5)
WBC: 5.8 10*3/uL (ref 3.6–11.0)

## 2016-03-18 LAB — BASIC METABOLIC PANEL
ANION GAP: 7 (ref 5–15)
BUN: 44 mg/dL — AB (ref 6–20)
CHLORIDE: 97 mmol/L — AB (ref 101–111)
CO2: 25 mmol/L (ref 22–32)
Calcium: 7.8 mg/dL — ABNORMAL LOW (ref 8.9–10.3)
Creatinine, Ser: 2.08 mg/dL — ABNORMAL HIGH (ref 0.44–1.00)
GFR calc Af Amer: 25 mL/min — ABNORMAL LOW (ref >60)
GFR, EST NON AFRICAN AMERICAN: 22 mL/min — AB (ref >60)
Glucose, Bld: 253 mg/dL — ABNORMAL HIGH (ref 65–99)
POTASSIUM: 4.1 mmol/L (ref 3.5–5.1)
Sodium: 129 mmol/L — ABNORMAL LOW (ref 135–145)

## 2016-03-18 LAB — SAMPLE TO BLOOD BANK

## 2016-03-18 LAB — FERRITIN: FERRITIN: 20 ng/mL (ref 11–307)

## 2016-03-18 LAB — PREPARE RBC (CROSSMATCH)

## 2016-03-18 MED ORDER — HEPARIN SOD (PORK) LOCK FLUSH 100 UNIT/ML IV SOLN
500.0000 [IU] | Freq: Once | INTRAVENOUS | Status: AC | PRN
  Administered 2016-03-18: 500 [IU]
  Filled 2016-03-18: qty 5

## 2016-03-18 MED ORDER — SODIUM CHLORIDE 0.9% FLUSH
10.0000 mL | INTRAVENOUS | Status: DC | PRN
Start: 2016-03-18 — End: 2016-03-18
  Administered 2016-03-18: 10 mL via INTRAVENOUS
  Filled 2016-03-18: qty 10

## 2016-03-18 MED ORDER — SODIUM CHLORIDE 0.9 % IV SOLN
200.0000 mg | Freq: Once | INTRAVENOUS | Status: AC
  Administered 2016-03-18: 200 mg via INTRAVENOUS
  Filled 2016-03-18: qty 10

## 2016-03-18 MED ORDER — SODIUM CHLORIDE 0.9 % IV SOLN
Freq: Once | INTRAVENOUS | Status: AC
  Administered 2016-03-18: 12:00:00 via INTRAVENOUS
  Filled 2016-03-18: qty 1000

## 2016-03-18 NOTE — Progress Notes (Signed)
[place] OFFICE PROGRESS NOTE  Patient Care Team: Kirk Ruths, MD as PCP - General (Internal Medicine) Cammie Sickle, MD as Consulting Physician (Hematology and Oncology)   SUMMARY OF HEMATOLOGIC HISTORY:  # CHRONIC ANEMIA sec Hx GIB/CKD [ CKD STAGE III]; Jan-2016 [colo/EGD- Dr.Elliot]; capsule x2 - angiodysplasia; BMBx- 1999; IV iron & procrit; Declined procrit [sep 2016]; last NOV 2016; Ferrahem]  # CHRONIC MILD THROMBOCYTOPENIA-   # CKD [creat 1.4-1.7]  # CHRONIC CELLULITIS/ AORTIC STENOSIS/ LIMITED MOBILITY   INTERVAL HISTORY:  81 year old female patient with multiple medical problems and above history of chronic anemia from chronic GI blood loss/chronic kidney disease on iron and Procrit is here for follow-up. Patient declined Procrit since September 2016.  Patient complains of extreme fatigue. She continues to have intermittent blood in stools with chronic diarrhea. This is really gotten better or worse. Previously she has activated this to Procrit.   Given recent exacerbation of congestive heart failure- patient is on diuretics; noted to have improvement of her shortness of breath/ which she does still get short of breath on exertion.   Swelling in legs are improved;Patient had multiple colonoscopies/EGDs in the past; most recent in January 2016.    REVIEW OF SYSTEMS:  A complete 10 point review of system is done which is negative except mentioned above/history of present illness.   PAST MEDICAL HISTORY :  Past Medical History  Diagnosis Date  . Hypertension   . Cervical cancer ([place])     cervical cancer history  . Iron deficiency anemia 2008  . GI bleed   . History of blood transfusion   . Bilateral cellulitis of lower leg   . CAD (coronary artery disease)   . Fatty liver   . Hyperlipidemia   . GERD (gastroesophageal reflux disease)   . History of nicotine use   . Chronic edema   . Diabetes mellitus with stage 3 chronic kidney  disease ([place])   . Trigeminal neuralgia   . Aortic stenosis   . Cardiomyopathy ([place])   . Gout   . Peptic ulcer disease   . Angiodysplasia   . Vascular malformation 06/14/1997  . Adenomatous polyps 03/05/2012  . COPD (chronic obstructive pulmonary disease) ([place])   . CHF (congestive heart failure) ([place])   . Fibrocystic breast changes   . Irritable bowel syndrome   . Arthritis   . Carotid bruit   . Tobacco abuse   . Osteoarthritis   . Macrocytosis   . Abnormal LFTs (liver function tests)   . PVD (peripheral vascular disease) ([place])   . AV malformation of GI tract   . Clostridium difficile infection March 02, 2015    PAST SURGICAL HISTORY :   Past Surgical History  Procedure Laterality Date  . Abdominal hysterectomy    . Bone marrow biopsy  1999  . Angiogram cardiac stenting    . Thoracentesis    . Esophagogastroduodenoscopy  12/2014, 2014, 2013, 2009, 2004  . Colonoscopy  12/2014, 2014, 2013, 2008, 2004    03/05/2012-Adenomatous Polyps;09/23/2003-Polyp remains intact;   . Cholecystectomy    . Tonsillectomy    . Nasal sinus surgery    . Endoscopic carpal tunnel release    . Tee without cardioversion N/A 12/19/2015    Procedure: TRANSESOPHAGEAL ECHOCARDIOGRAM (TEE);  Surgeon: Yolonda Kida, MD;  Location: [facility] ORS;  Service: Cardiovascular;  Laterality: N/A;    FAMILY HISTORY :   Family History  Problem Relation Age of Onset  . Parkinsonism Mother   .  Heart attack Mother   . Lung cancer Father   . Diabetes Mellitus II Father   . Asthma Sister   . Asthma Brother   . Heart disease Son     SOCIAL HISTORY:   Social History  Substance Use Topics  . Smoking status: Former Smoker -- 0.50 packs/day for 56 years    Types: Cigarettes    Quit date: 05/23/2013  . Smokeless tobacco: Never Used  . Alcohol Use: No    ALLERGIES:  is allergic to aspirin; atenolol; clopidogrel bisulfate; darvon; hydrochlorothiazide; iodinated diagnostic agents; nylon; other; and  pravastatin.  MEDICATIONS:  Current Outpatient Prescriptions  Medication Sig Dispense Refill  . lisinopril (PRINIVIL,ZESTRIL) 40 MG tablet Take 40 mg by mouth daily.    . metolazone (ZAROXOLYN) 5 MG tablet Take 5 mg by mouth 2 (two) times a week.    . metoprolol succinate (TOPROL-XL) 50 MG 24 hr tablet Take 1 tablet by mouth daily.    . pantoprazole (PROTONIX) 20 MG tablet Take 20 mg by mouth daily.    . pantoprazole (PROTONIX) 40 MG tablet Take 1 tablet by mouth daily. Reported on 12/19/2015    . potassium chloride (K-DUR) 10 MEQ tablet Take 10 mEq by mouth 3 (three) times a week.    . simvastatin (ZOCOR) 20 MG tablet Take 20 mg by mouth at bedtime.     . torsemide (DEMADEX) 20 MG tablet Take 1 tablet by mouth 3 (three) times daily.     No current facility-administered medications for this visit.   Facility-Administered Medications Ordered in Other Visits  Medication Dose Route Frequency Provider Last Rate Last Dose  . sodium chloride flush (NS) 0.9 % injection 10 mL  10 mL Intravenous PRN  R , MD   10 mL at 03/18/16 1000    PHYSICAL EXAMINATION: ECOG PERFORMANCE STATUS: 2 - Symptomatic, <50% confined to bed  BP 154/57 mmHg  Pulse 72  Temp(Src) 96.8 F (36 C) (Tympanic)  Resp 18  Wt 181 lb (82.1 kg)  SpO2 97%  Filed Weights   03/18/16 1028  Weight: 181 lb (82.1 kg)    GENERAL: Well-nourished well-developed; Alert, no distress and comfortable.   She is accompanied by her brother. She is in a wheelchair. She cannot sit on the exam table.  EYES: Positive for pallor. OROPHARYNX: no thrush or ulceration; good dentition  NECK: supple, no masses felt LYMPH:  no palpable lymphadenopathy in the cervical,  LUNGS: clear to auscultation and  No wheeze or crackles HEART/CVS: regular rate & rhythm and positive for systolic murmurs; bilateral lower extremity swelling ; left larger than the right /chronic venous stasis changes noted.  ABDOMEN:abdomen soft, non-tender and  normal bowel sounds Musculoskeletal:no cyanosis of digits and no clubbing  PSYCH: alert & oriented x 3 with fluent speech NEURO: no focal motor/sensory deficits SKIN:  erythema of the left lower extremity /improving.   LABORATORY DATA:  I have reviewed the data as listed    Component Value Date/Time   NA 129* 03/18/2016 0954   NA 139 01/18/2015 0545   K 4.1 03/18/2016 0954   K 3.9 01/18/2015 0545   CL 97* 03/18/2016 0954   CL 105 01/18/2015 0545   CO2 25 03/18/2016 0954   CO2 25 01/18/2015 0545   GLUCOSE 253* 03/18/2016 0954   GLUCOSE 179* 01/18/2015 0545   BUN 44* 03/18/2016 0954   BUN 29* 01/18/2015 0545   CREATININE 2.08* 03/18/2016 0954   CREATININE 1.80* 01/18/2015 0545   CALCIUM 7.8*   03/18/2016 0954   CALCIUM 8.7 01/18/2015 0545   PROT 7.1 01/19/2016 1009   PROT 6.6 06/20/2014 1101   ALBUMIN 3.1* 01/19/2016 1009   ALBUMIN 3.1* 06/20/2014 1101   AST 20 01/19/2016 1009   AST 32 06/20/2014 1101   ALT 15 01/19/2016 1009   ALT 31 06/20/2014 1101   ALKPHOS 115 01/19/2016 1009   ALKPHOS 125* 06/20/2014 1101   BILITOT 0.6 01/19/2016 1009   BILITOT 0.3 06/20/2014 1101   GFRNONAA 22* 03/18/2016 0954   GFRNONAA 29* 01/18/2015 0545   GFRNONAA 28* 06/20/2014 1101   GFRAA 25* 03/18/2016 0954   GFRAA 35* 01/18/2015 0545   GFRAA 33* 06/20/2014 1101    No results found for: SPEP, UPEP  Lab Results  Component Value Date   WBC 5.8 03/18/2016   NEUTROABS 4.8 03/18/2016   HGB 6.3* 03/18/2016   HCT 18.5* 03/18/2016   MCV 88.6 03/18/2016   PLT 122* 03/18/2016      Chemistry      Component Value Date/Time   NA 129* 03/18/2016 0954   NA 139 01/18/2015 0545   K 4.1 03/18/2016 0954   K 3.9 01/18/2015 0545   CL 97* 03/18/2016 0954   CL 105 01/18/2015 0545   CO2 25 03/18/2016 0954   CO2 25 01/18/2015 0545   BUN 44* 03/18/2016 0954   BUN 29* 01/18/2015 0545   CREATININE 2.08* 03/18/2016 0954   CREATININE 1.80* 01/18/2015 0545      Component Value Date/Time   CALCIUM  7.8* 03/18/2016 0954   CALCIUM 8.7 01/18/2015 0545   ALKPHOS 115 01/19/2016 1009   ALKPHOS 125* 06/20/2014 1101   AST 20 01/19/2016 1009   AST 32 06/20/2014 1101   ALT 15 01/19/2016 1009   ALT 31 06/20/2014 1101   BILITOT 0.6 01/19/2016 1009   BILITOT 0.3 06/20/2014 1101        ASSESSMENT & PLAN:   # CHRONIC ANEMIA- secondary to GI bleed/AV malformations; iron deficiency/ chronic kidney disease.Today hemoglobin 6.3 symptomatic. Patient had been declining Procrit since September 2016. I recommend IV Venofer today; every 4 weeks. And Aranesp every 2 weeks. I discussed the need for the Aranesp and the IV iron again today. Patient agrees to go back on Aranesp. Will also transfuse blood.  # Artery stenosis - Patient is felt to be at high risk for open heart surgery for aortic stenosis.   # Thrombocytopenia platelets - Mild about 100,000. Question congestive splenomegaly versus others.  Monitor for now.   # patient will follow-up next week for labs and possible Aranesp. CBC every 2 weeks ; follow up with me in 8 weeks for Procrit /IV iron.   # Chronic kidney disease prednisone 1.5-1.7. Today creatinine is 2. Likely secondary to increased diuretics/for CHF. Deferred to PCP for further elevation. We'll send a copy of the labs to PCP's office/ also send a note.     Cammie Sickle, MD 03/18/2016 10:41 AM

## 2016-03-18 NOTE — Progress Notes (Signed)
Per v/o md, RN forwarded the pt's lab results to Dr. Ouida Sills for his review. The plan is to proceed with IV iron today and set patient for a blood transfusion tomorrow.

## 2016-03-18 NOTE — Progress Notes (Signed)
Patient has been noticing blood in stool for the past 3 days with frequent bowel movements being 3-4 or 5-6 times a day.  She knows that her hemoglobin is going to be low because she has been feeling extremely weak and not able to walk thru her house without the feeling of getting choked with feeling like going to fall in the floor at any time.  Had blood drawn at PCP on 03/01/16 and her hemoglobin then was 9.7 (results in [place]).

## 2016-03-19 ENCOUNTER — Inpatient Hospital Stay: Payer: Medicare Other

## 2016-03-19 VITALS — BP 141/66 | HR 79 | Temp 98.6°F | Resp 20

## 2016-03-19 DIAGNOSIS — I129 Hypertensive chronic kidney disease with stage 1 through stage 4 chronic kidney disease, or unspecified chronic kidney disease: Secondary | ICD-10-CM | POA: Diagnosis not present

## 2016-03-19 DIAGNOSIS — D631 Anemia in chronic kidney disease: Secondary | ICD-10-CM

## 2016-03-19 DIAGNOSIS — N189 Chronic kidney disease, unspecified: Principal | ICD-10-CM

## 2016-03-19 MED ORDER — DIPHENHYDRAMINE HCL 25 MG PO CAPS
25.0000 mg | ORAL_CAPSULE | Freq: Once | ORAL | Status: AC
  Administered 2016-03-19: 25 mg via ORAL
  Filled 2016-03-19: qty 1

## 2016-03-19 MED ORDER — HEPARIN SOD (PORK) LOCK FLUSH 100 UNIT/ML IV SOLN
500.0000 [IU] | Freq: Every day | INTRAVENOUS | Status: AC | PRN
  Administered 2016-03-19: 500 [IU]
  Filled 2016-03-19: qty 5

## 2016-03-19 MED ORDER — FUROSEMIDE 10 MG/ML IJ SOLN
20.0000 mg | Freq: Once | INTRAMUSCULAR | Status: AC
  Administered 2016-03-19: 20 mg via INTRAVENOUS
  Filled 2016-03-19: qty 2

## 2016-03-19 MED ORDER — SODIUM CHLORIDE 0.9 % IV SOLN
250.0000 mL | Freq: Once | INTRAVENOUS | Status: AC
  Administered 2016-03-19: 250 mL via INTRAVENOUS
  Filled 2016-03-19: qty 250

## 2016-03-19 MED ORDER — ACETAMINOPHEN 325 MG PO TABS
650.0000 mg | ORAL_TABLET | Freq: Once | ORAL | Status: AC
  Administered 2016-03-19: 650 mg via ORAL
  Filled 2016-03-19: qty 2

## 2016-03-19 MED ORDER — SODIUM CHLORIDE 0.9% FLUSH
10.0000 mL | INTRAVENOUS | Status: AC | PRN
  Administered 2016-03-19: 10 mL
  Filled 2016-03-19: qty 10

## 2016-03-20 LAB — TYPE AND SCREEN
ABO/RH(D): B POS
ANTIBODY SCREEN: NEGATIVE
Unit division: 0
Unit division: 0

## 2016-03-25 ENCOUNTER — Inpatient Hospital Stay: Payer: Medicare Other

## 2016-03-25 ENCOUNTER — Inpatient Hospital Stay: Payer: Medicare Other | Attending: Internal Medicine

## 2016-03-25 VITALS — BP 139/83 | HR 71 | Resp 20

## 2016-03-25 DIAGNOSIS — D696 Thrombocytopenia, unspecified: Secondary | ICD-10-CM | POA: Insufficient documentation

## 2016-03-25 DIAGNOSIS — N183 Chronic kidney disease, stage 3 unspecified: Secondary | ICD-10-CM

## 2016-03-25 DIAGNOSIS — D508 Other iron deficiency anemias: Secondary | ICD-10-CM

## 2016-03-25 DIAGNOSIS — D631 Anemia in chronic kidney disease: Secondary | ICD-10-CM | POA: Insufficient documentation

## 2016-03-25 DIAGNOSIS — I129 Hypertensive chronic kidney disease with stage 1 through stage 4 chronic kidney disease, or unspecified chronic kidney disease: Secondary | ICD-10-CM | POA: Diagnosis not present

## 2016-03-25 DIAGNOSIS — N189 Chronic kidney disease, unspecified: Secondary | ICD-10-CM

## 2016-03-25 DIAGNOSIS — Z79899 Other long term (current) drug therapy: Secondary | ICD-10-CM | POA: Diagnosis not present

## 2016-03-25 DIAGNOSIS — D509 Iron deficiency anemia, unspecified: Secondary | ICD-10-CM

## 2016-03-25 LAB — CBC WITH DIFFERENTIAL/PLATELET
BASOS ABS: 0 10*3/uL (ref 0–0.1)
BASOS PCT: 1 %
EOS ABS: 0.1 10*3/uL (ref 0–0.7)
Eosinophils Relative: 1 %
HCT: 25.6 % — ABNORMAL LOW (ref 35.0–47.0)
Hemoglobin: 8.7 g/dL — ABNORMAL LOW (ref 12.0–16.0)
Lymphocytes Relative: 8 %
Lymphs Abs: 0.4 10*3/uL — ABNORMAL LOW (ref 1.0–3.6)
MCH: 30.1 pg (ref 26.0–34.0)
MCHC: 33.8 g/dL (ref 32.0–36.0)
MCV: 88.9 fL (ref 80.0–100.0)
MONO ABS: 0.4 10*3/uL (ref 0.2–0.9)
MONOS PCT: 7 %
NEUTROS ABS: 4.4 10*3/uL (ref 1.4–6.5)
Neutrophils Relative %: 83 %
Platelets: 99 10*3/uL — ABNORMAL LOW (ref 150–440)
RBC: 2.88 MIL/uL — ABNORMAL LOW (ref 3.80–5.20)
RDW: 16.4 % — AB (ref 11.5–14.5)
WBC: 5.4 10*3/uL (ref 3.6–11.0)

## 2016-03-25 LAB — SAMPLE TO BLOOD BANK

## 2016-03-25 MED ORDER — DARBEPOETIN ALFA 200 MCG/0.4ML IJ SOSY
200.0000 ug | PREFILLED_SYRINGE | Freq: Once | INTRAMUSCULAR | Status: AC
Start: 2016-03-25 — End: 2016-03-25
  Administered 2016-03-25: 200 ug via SUBCUTANEOUS
  Filled 2016-03-25: qty 0.4

## 2016-04-01 ENCOUNTER — Inpatient Hospital Stay: Payer: Medicare Other

## 2016-04-01 DIAGNOSIS — D509 Iron deficiency anemia, unspecified: Secondary | ICD-10-CM

## 2016-04-01 DIAGNOSIS — N183 Chronic kidney disease, stage 3 unspecified: Secondary | ICD-10-CM

## 2016-04-01 DIAGNOSIS — N189 Chronic kidney disease, unspecified: Secondary | ICD-10-CM

## 2016-04-01 DIAGNOSIS — D631 Anemia in chronic kidney disease: Secondary | ICD-10-CM

## 2016-04-01 DIAGNOSIS — I129 Hypertensive chronic kidney disease with stage 1 through stage 4 chronic kidney disease, or unspecified chronic kidney disease: Secondary | ICD-10-CM | POA: Diagnosis not present

## 2016-04-01 LAB — CBC WITH DIFFERENTIAL/PLATELET
BASOS ABS: 0 10*3/uL (ref 0–0.1)
Basophils Relative: 1 %
EOS PCT: 1 %
Eosinophils Absolute: 0.1 10*3/uL (ref 0–0.7)
HEMATOCRIT: 25 % — AB (ref 35.0–47.0)
Hemoglobin: 8.3 g/dL — ABNORMAL LOW (ref 12.0–16.0)
LYMPHS ABS: 0.5 10*3/uL — AB (ref 1.0–3.6)
LYMPHS PCT: 10 %
MCH: 29.7 pg (ref 26.0–34.0)
MCHC: 33.3 g/dL (ref 32.0–36.0)
MCV: 89.2 fL (ref 80.0–100.0)
MONO ABS: 0.4 10*3/uL (ref 0.2–0.9)
Monocytes Relative: 8 %
NEUTROS PCT: 80 %
Neutro Abs: 4 10*3/uL (ref 1.4–6.5)
PLATELETS: 112 10*3/uL — AB (ref 150–440)
RBC: 2.8 MIL/uL — ABNORMAL LOW (ref 3.80–5.20)
RDW: 16.5 % — AB (ref 11.5–14.5)
WBC: 5 10*3/uL (ref 3.6–11.0)

## 2016-04-01 LAB — SAMPLE TO BLOOD BANK

## 2016-04-01 NOTE — Progress Notes (Signed)
Patient does not want injection today (Aranasp).  Dr. Burlene Arnt aware of this .  He gave order to hold injection and have patient keep next lab appointment.  Patient agreed.

## 2016-04-08 ENCOUNTER — Telehealth: Payer: Self-pay | Admitting: Internal Medicine

## 2016-04-08 ENCOUNTER — Other Ambulatory Visit: Payer: Self-pay | Admitting: Internal Medicine

## 2016-04-08 ENCOUNTER — Inpatient Hospital Stay: Payer: Medicare Other

## 2016-04-08 ENCOUNTER — Other Ambulatory Visit: Payer: Self-pay | Admitting: *Deleted

## 2016-04-08 DIAGNOSIS — N183 Chronic kidney disease, stage 3 unspecified: Secondary | ICD-10-CM

## 2016-04-08 DIAGNOSIS — D509 Iron deficiency anemia, unspecified: Secondary | ICD-10-CM

## 2016-04-08 DIAGNOSIS — I129 Hypertensive chronic kidney disease with stage 1 through stage 4 chronic kidney disease, or unspecified chronic kidney disease: Secondary | ICD-10-CM | POA: Diagnosis not present

## 2016-04-08 DIAGNOSIS — N189 Chronic kidney disease, unspecified: Secondary | ICD-10-CM

## 2016-04-08 DIAGNOSIS — D631 Anemia in chronic kidney disease: Secondary | ICD-10-CM

## 2016-04-08 LAB — FERRITIN: Ferritin: 18 ng/mL (ref 11–307)

## 2016-04-08 LAB — CBC WITH DIFFERENTIAL/PLATELET
BASOS PCT: 1 %
Basophils Absolute: 0 10*3/uL (ref 0–0.1)
EOS ABS: 0.1 10*3/uL (ref 0–0.7)
EOS PCT: 1 %
HCT: 22.3 % — ABNORMAL LOW (ref 35.0–47.0)
HEMOGLOBIN: 7.3 g/dL — AB (ref 12.0–16.0)
Lymphocytes Relative: 10 %
Lymphs Abs: 0.4 10*3/uL — ABNORMAL LOW (ref 1.0–3.6)
MCH: 29.2 pg (ref 26.0–34.0)
MCHC: 33 g/dL (ref 32.0–36.0)
MCV: 88.6 fL (ref 80.0–100.0)
MONOS PCT: 8 %
Monocytes Absolute: 0.4 10*3/uL (ref 0.2–0.9)
NEUTROS PCT: 80 %
Neutro Abs: 3.6 10*3/uL (ref 1.4–6.5)
PLATELETS: 115 10*3/uL — AB (ref 150–440)
RBC: 2.51 MIL/uL — ABNORMAL LOW (ref 3.80–5.20)
RDW: 16.2 % — AB (ref 11.5–14.5)
WBC: 4.5 10*3/uL (ref 3.6–11.0)

## 2016-04-08 LAB — IRON AND TIBC
IRON: 31 ug/dL (ref 28–170)
Saturation Ratios: 8 % — ABNORMAL LOW (ref 10.4–31.8)
TIBC: 401 ug/dL (ref 250–450)
UIBC: 370 ug/dL

## 2016-04-08 LAB — SAMPLE TO BLOOD BANK

## 2016-04-08 NOTE — Telephone Encounter (Signed)
Patient called and wants to know about whether or not to come in for an infusion. She said she was told her hgb was 7.3 and that she would need to come back in but was not advised of when. Please call: [phone]

## 2016-04-08 NOTE — Progress Notes (Signed)
Pt came for lab only apt today. She requested ferritin/iibc to be drawn today. States that she feels fatigued. RN spoke with DR. Brahmanday. V/o given to RN to order these labs.

## 2016-04-08 NOTE — Progress Notes (Signed)
Pt here for Aranesp injection, hgb 7.3.  Pt refused injection, states that it made her feel horrible with increased bloody diarrhea.  She prefers to have ferritin level checked and would like to get Feraheme.  Dr. Rogue Bussing notified, ferritin labs drawn and pt will be called when lab results are back.

## 2016-04-09 ENCOUNTER — Inpatient Hospital Stay: Payer: Medicare Other

## 2016-04-09 ENCOUNTER — Telehealth: Payer: Self-pay | Admitting: Internal Medicine

## 2016-04-09 ENCOUNTER — Other Ambulatory Visit: Payer: Self-pay | Admitting: Internal Medicine

## 2016-04-09 VITALS — BP 121/77 | HR 102 | Temp 97.0°F | Resp 20

## 2016-04-09 DIAGNOSIS — D500 Iron deficiency anemia secondary to blood loss (chronic): Secondary | ICD-10-CM

## 2016-04-09 DIAGNOSIS — I129 Hypertensive chronic kidney disease with stage 1 through stage 4 chronic kidney disease, or unspecified chronic kidney disease: Secondary | ICD-10-CM | POA: Diagnosis not present

## 2016-04-09 MED ORDER — HEPARIN SOD (PORK) LOCK FLUSH 100 UNIT/ML IV SOLN
500.0000 [IU] | Freq: Once | INTRAVENOUS | Status: AC
  Administered 2016-04-09: 500 [IU] via INTRAVENOUS

## 2016-04-09 MED ORDER — SODIUM CHLORIDE 0.9 % IV SOLN
200.0000 mg | Freq: Once | INTRAVENOUS | Status: AC
  Administered 2016-04-09: 200 mg via INTRAVENOUS
  Filled 2016-04-09: qty 10

## 2016-04-09 MED ORDER — HEPARIN SOD (PORK) LOCK FLUSH 100 UNIT/ML IV SOLN
INTRAVENOUS | Status: AC
  Filled 2016-04-09: qty 5

## 2016-04-09 MED ORDER — SODIUM CHLORIDE 0.9 % IV SOLN
Freq: Once | INTRAVENOUS | Status: AC
  Administered 2016-04-09: 14:00:00 via INTRAVENOUS
  Filled 2016-04-09: qty 1000

## 2016-04-09 NOTE — Telephone Encounter (Signed)
Spoke with Maudie Mercury, charge nurse infusion. Ok to accommodate add on 60-90 min IV venofer today at [facility]. Spoke with patient. She agreed to come at that time.

## 2016-04-09 NOTE — Telephone Encounter (Signed)
Pt refused aranesp in clinic yesterday and requested the iron panels be collected. Yesterday, Pt was instructed that she would be notified regarding her iron level and if IV iron would be clinical necessary. Pt was instructed that she would not given an apt for IV iron until MD reviews lab results.  Pt has called back to cancer center multiple times today asking when she "come for her iron treatment today". She spoke with Dr. Grayland Ormond last evening, Triage nurse and Mickel Baas in [place] today. Md made aware and he will review chart to determine if iron is needed.

## 2016-04-09 NOTE — Telephone Encounter (Signed)
Recommend IV Venofer as soon as possible. Heather please inform pt/schedule for IV venofer. Thx

## 2016-04-15 ENCOUNTER — Inpatient Hospital Stay: Payer: Medicare Other

## 2016-04-15 ENCOUNTER — Other Ambulatory Visit: Payer: Self-pay | Admitting: *Deleted

## 2016-04-15 ENCOUNTER — Other Ambulatory Visit: Payer: Self-pay | Admitting: Internal Medicine

## 2016-04-15 DIAGNOSIS — N189 Chronic kidney disease, unspecified: Principal | ICD-10-CM

## 2016-04-15 DIAGNOSIS — D631 Anemia in chronic kidney disease: Secondary | ICD-10-CM

## 2016-04-15 DIAGNOSIS — I129 Hypertensive chronic kidney disease with stage 1 through stage 4 chronic kidney disease, or unspecified chronic kidney disease: Secondary | ICD-10-CM | POA: Diagnosis not present

## 2016-04-15 DIAGNOSIS — N183 Chronic kidney disease, stage 3 unspecified: Secondary | ICD-10-CM

## 2016-04-15 DIAGNOSIS — D509 Iron deficiency anemia, unspecified: Secondary | ICD-10-CM

## 2016-04-15 DIAGNOSIS — D508 Other iron deficiency anemias: Secondary | ICD-10-CM

## 2016-04-15 LAB — SAMPLE TO BLOOD BANK

## 2016-04-15 LAB — CBC WITH DIFFERENTIAL/PLATELET
BASOS ABS: 0.1 10*3/uL (ref 0–0.1)
Basophils Relative: 1 %
Eosinophils Absolute: 0.1 10*3/uL (ref 0–0.7)
Eosinophils Relative: 1 %
HEMATOCRIT: 21.2 % — AB (ref 35.0–47.0)
HEMOGLOBIN: 6.9 g/dL — AB (ref 12.0–16.0)
LYMPHS ABS: 0.4 10*3/uL — AB (ref 1.0–3.6)
LYMPHS PCT: 10 %
MCH: 29.3 pg (ref 26.0–34.0)
MCHC: 32.6 g/dL (ref 32.0–36.0)
MCV: 89.9 fL (ref 80.0–100.0)
Monocytes Absolute: 0.3 10*3/uL (ref 0.2–0.9)
Monocytes Relative: 7 %
NEUTROS ABS: 3.4 10*3/uL (ref 1.4–6.5)
Neutrophils Relative %: 81 %
Platelets: 111 10*3/uL — ABNORMAL LOW (ref 150–440)
RBC: 2.36 MIL/uL — AB (ref 3.80–5.20)
RDW: 16.8 % — ABNORMAL HIGH (ref 11.5–14.5)
WBC: 4.3 10*3/uL (ref 3.6–11.0)

## 2016-04-15 LAB — PREPARE RBC (CROSSMATCH)

## 2016-04-15 MED ORDER — DARBEPOETIN ALFA 200 MCG/0.4ML IJ SOSY
200.0000 ug | PREFILLED_SYRINGE | Freq: Once | INTRAMUSCULAR | Status: DC
  Filled 2016-04-15: qty 0.4

## 2016-04-15 NOTE — Progress Notes (Signed)
2 units of blood/prepare rbc/type and screen released. RN spoke with Shelly in blood bank.

## 2016-04-16 ENCOUNTER — Inpatient Hospital Stay: Payer: Medicare Other

## 2016-04-16 VITALS — BP 171/95 | HR 73 | Temp 97.0°F | Resp 20

## 2016-04-16 DIAGNOSIS — I129 Hypertensive chronic kidney disease with stage 1 through stage 4 chronic kidney disease, or unspecified chronic kidney disease: Secondary | ICD-10-CM | POA: Diagnosis not present

## 2016-04-16 DIAGNOSIS — N189 Chronic kidney disease, unspecified: Principal | ICD-10-CM

## 2016-04-16 DIAGNOSIS — D631 Anemia in chronic kidney disease: Secondary | ICD-10-CM

## 2016-04-16 MED ORDER — HEPARIN SOD (PORK) LOCK FLUSH 100 UNIT/ML IV SOLN
500.0000 [IU] | Freq: Every day | INTRAVENOUS | Status: AC | PRN
  Administered 2016-04-16: 500 [IU]
  Filled 2016-04-16: qty 5

## 2016-04-16 MED ORDER — ACETAMINOPHEN 325 MG PO TABS
650.0000 mg | ORAL_TABLET | Freq: Once | ORAL | Status: AC
  Administered 2016-04-16: 650 mg via ORAL
  Filled 2016-04-16: qty 2

## 2016-04-16 MED ORDER — DIPHENHYDRAMINE HCL 25 MG PO CAPS
25.0000 mg | ORAL_CAPSULE | Freq: Once | ORAL | Status: AC
  Administered 2016-04-16: 25 mg via ORAL
  Filled 2016-04-16: qty 1

## 2016-04-16 MED ORDER — SODIUM CHLORIDE 0.9 % IV SOLN
250.0000 mL | Freq: Once | INTRAVENOUS | Status: AC
  Administered 2016-04-16: 250 mL via INTRAVENOUS
  Filled 2016-04-16: qty 250

## 2016-04-16 MED ORDER — SODIUM CHLORIDE 0.9% FLUSH
10.0000 mL | INTRAVENOUS | Status: AC | PRN
  Administered 2016-04-16: 10 mL
  Filled 2016-04-16: qty 10

## 2016-04-16 MED ORDER — FUROSEMIDE 10 MG/ML IJ SOLN
20.0000 mg | Freq: Once | INTRAMUSCULAR | Status: AC
  Administered 2016-04-16: 20 mg via INTRAVENOUS
  Filled 2016-04-16: qty 2

## 2016-04-17 LAB — TYPE AND SCREEN
ABO/RH(D): B POS
ANTIBODY SCREEN: NEGATIVE
UNIT DIVISION: 0
Unit division: 0

## 2016-04-21 IMAGING — US US EXTREM LOW VENOUS*L*
1 series · 13 of 24 positions shown · non-contrast
Comparison: 04/29/2013

CLINICAL DATA: Left leg edema.



[Series 1: us extrem low venous*left* · 0.08mm/px · 13 of 31 slices shown]
[im 1/31]
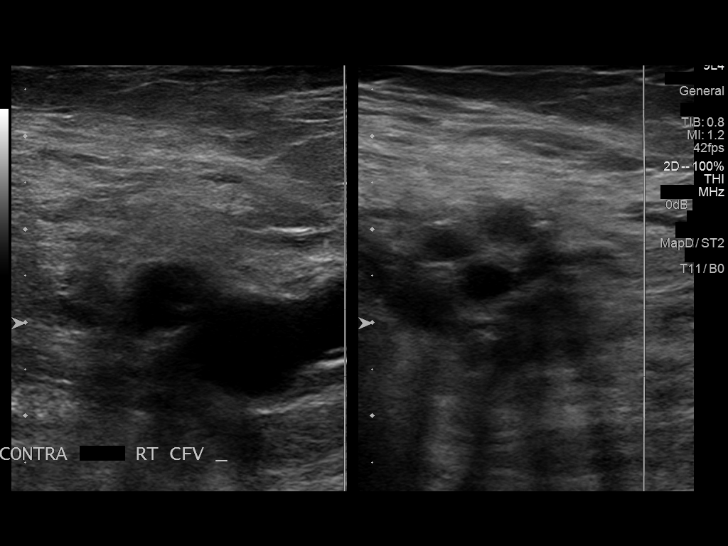
[im 3/31]
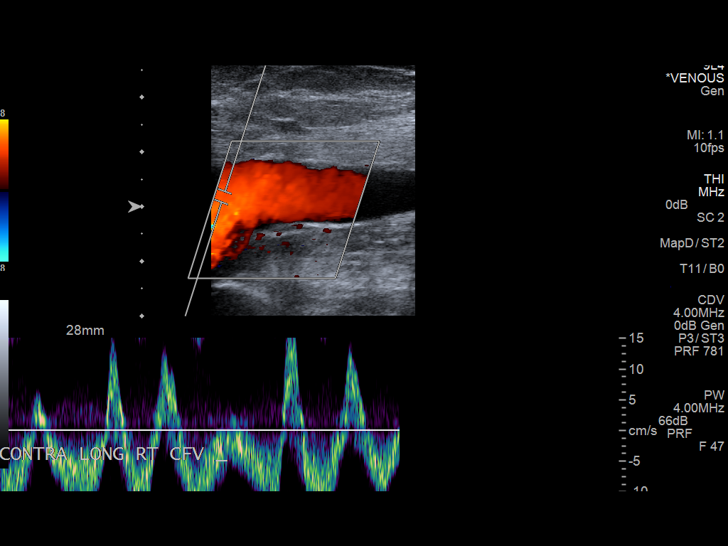
[im 6/31]
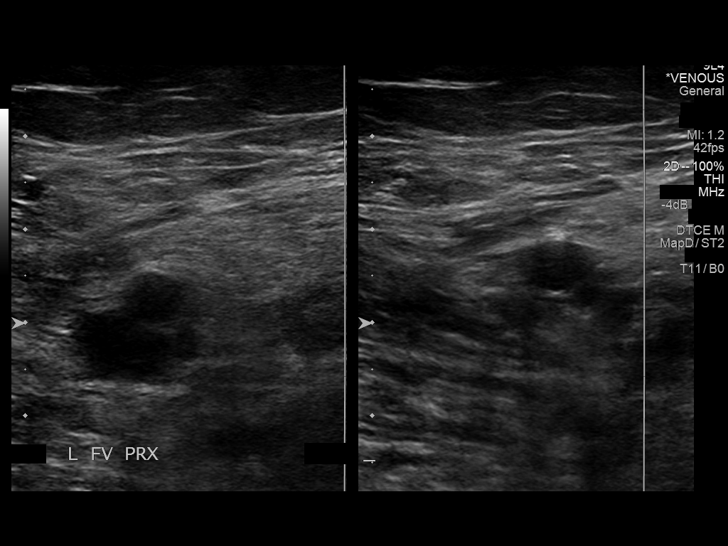
[im 8/31]
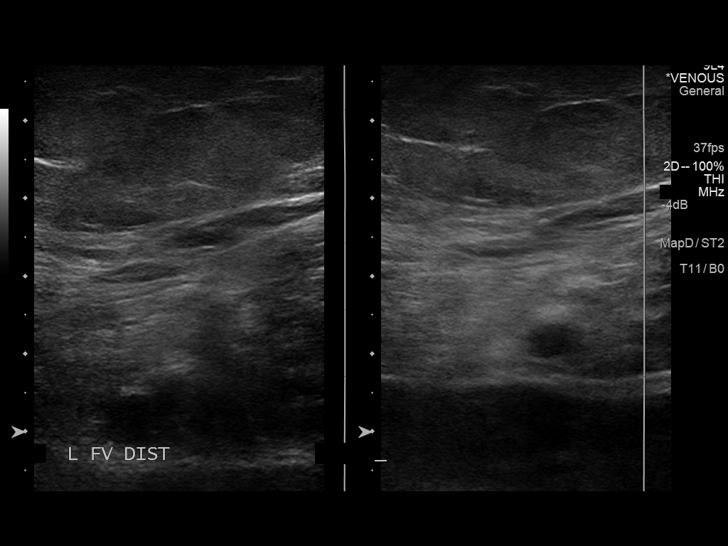
[im 11/31]
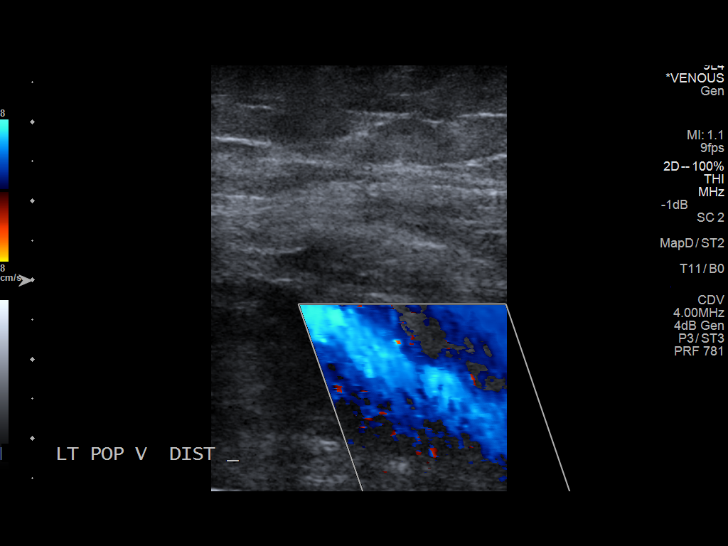
[im 14/31]
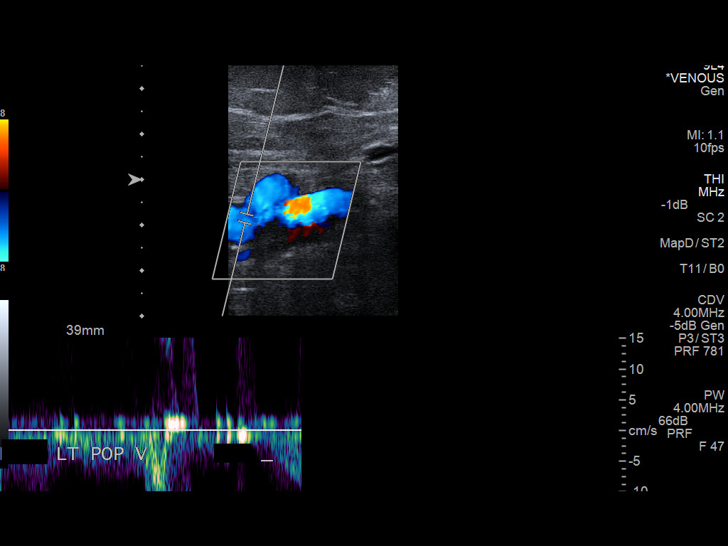
[im 16/31]
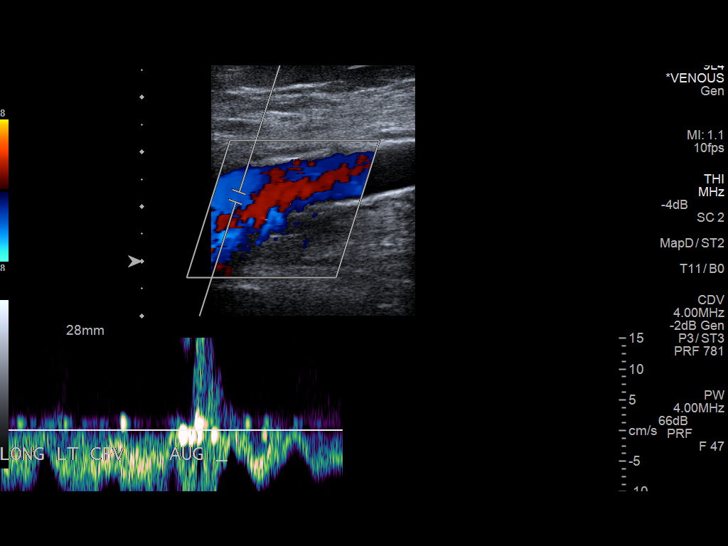
[im 17/31]
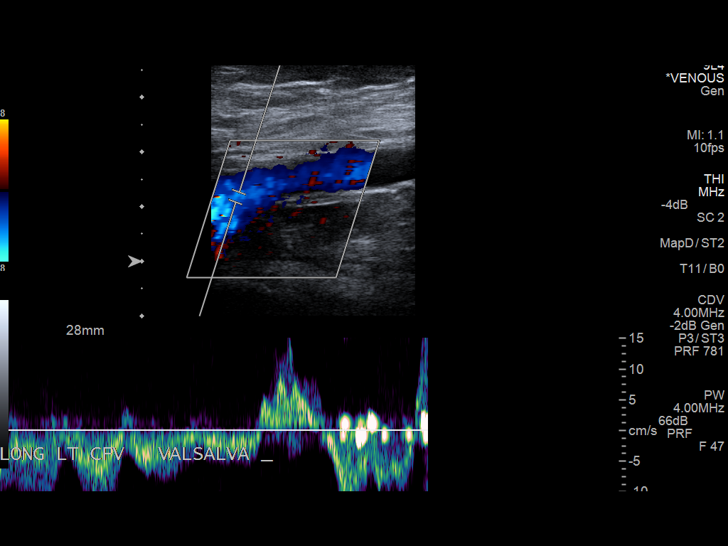
[im 20/31]
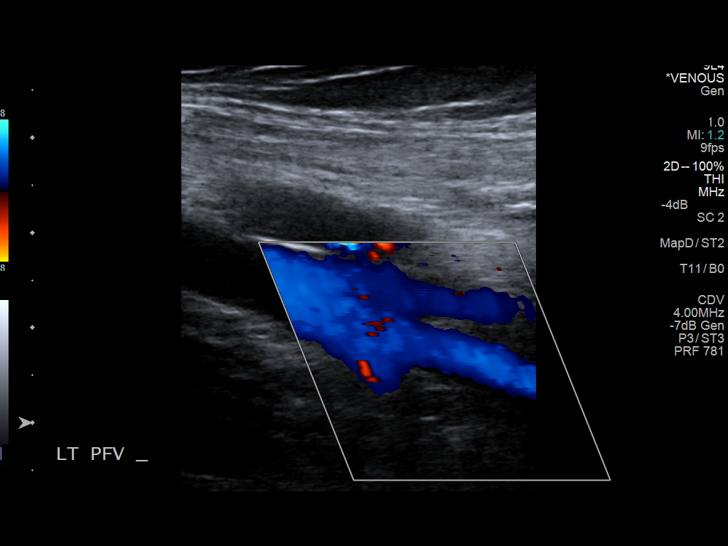
[im 23/31]
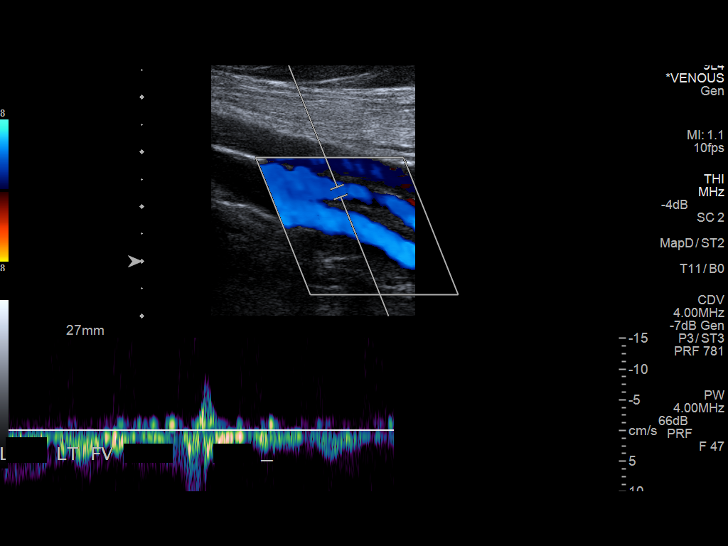
[im 25/31]
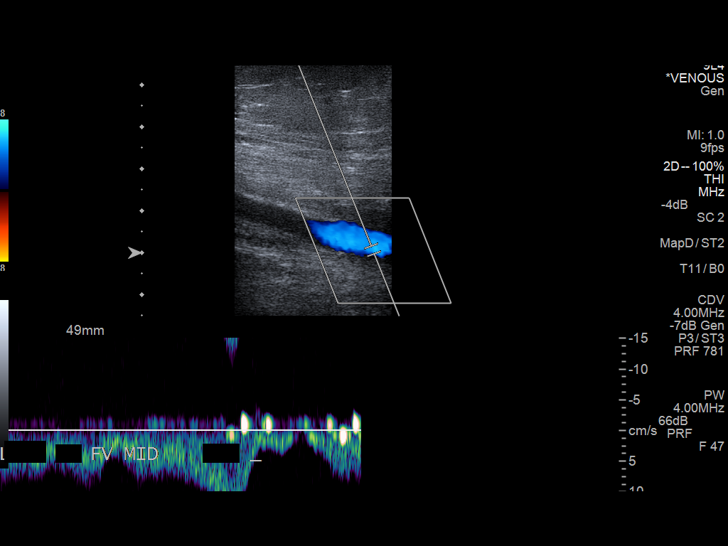
[im 28/31]
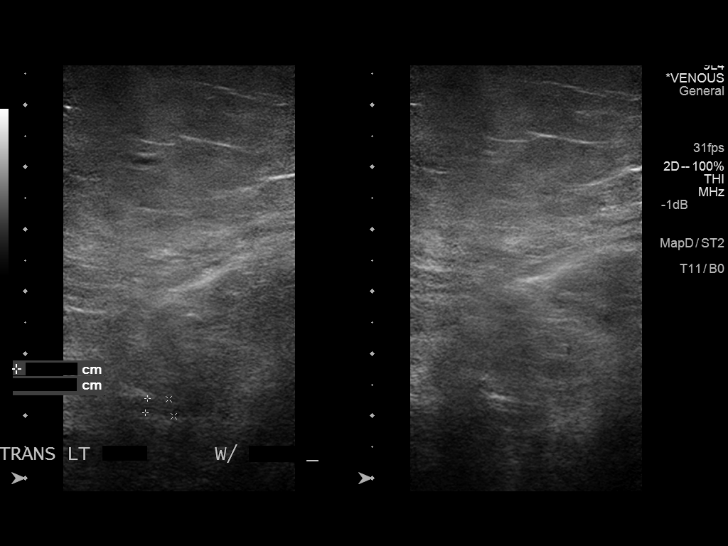
[im 31/31]
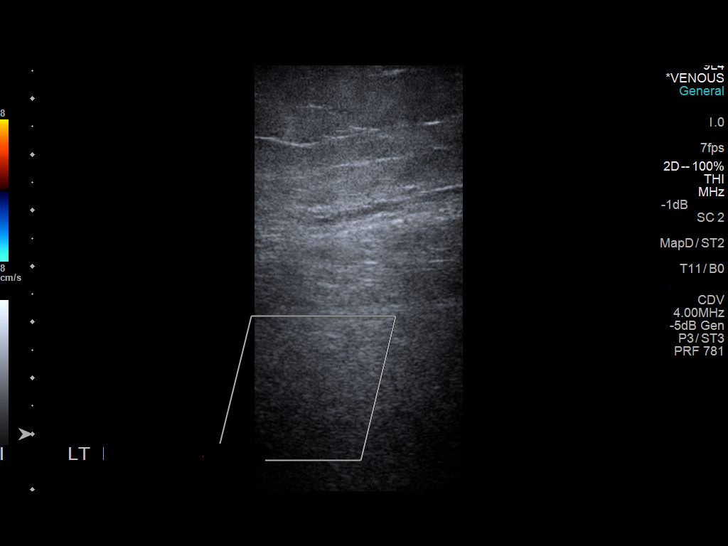

[13 of 24 positions shown; findings below may reference images not displayed]

FINDINGS: Contralateral Common Femoral Vein: Respiratory phasicity is normal
and symmetric with the symptomatic side. No evidence of thrombus.
Normal compressibility.

Common Femoral Vein: No evidence of thrombus. Normal
compressibility, respiratory phasicity and response to augmentation.

Saphenofemoral Junction: No evidence of thrombus. Normal
compressibility and flow on color Doppler imaging.

Profunda Femoral Vein: No evidence of thrombus. Normal
compressibility and flow on color Doppler imaging.

Femoral Vein: No evidence of thrombus. Normal compressibility,
respiratory phasicity and response to augmentation.

Popliteal Vein: No evidence of thrombus. Normal compressibility,
respiratory phasicity and response to augmentation.

Calf Veins: Not well evaluated because of significant edema.

Superficial Great Saphenous Vein: No evidence of thrombus. Normal
compressibility and flow on color Doppler imaging.

Venous Reflux:  None.

Other Findings:  There is significant calf edema.
IMPRESSION: No evidence of deep venous thrombosis.

## 2016-04-22 ENCOUNTER — Inpatient Hospital Stay: Payer: Medicare Other

## 2016-04-22 ENCOUNTER — Encounter: Payer: Self-pay | Admitting: *Deleted

## 2016-04-22 ENCOUNTER — Inpatient Hospital Stay: Payer: Medicare Other | Attending: Internal Medicine

## 2016-04-22 DIAGNOSIS — M1990 Unspecified osteoarthritis, unspecified site: Secondary | ICD-10-CM | POA: Diagnosis not present

## 2016-04-22 DIAGNOSIS — K219 Gastro-esophageal reflux disease without esophagitis: Secondary | ICD-10-CM | POA: Insufficient documentation

## 2016-04-22 DIAGNOSIS — E785 Hyperlipidemia, unspecified: Secondary | ICD-10-CM | POA: Insufficient documentation

## 2016-04-22 DIAGNOSIS — K760 Fatty (change of) liver, not elsewhere classified: Secondary | ICD-10-CM | POA: Insufficient documentation

## 2016-04-22 DIAGNOSIS — D509 Iron deficiency anemia, unspecified: Secondary | ICD-10-CM

## 2016-04-22 DIAGNOSIS — N183 Chronic kidney disease, stage 3 unspecified: Secondary | ICD-10-CM

## 2016-04-22 DIAGNOSIS — E119 Type 2 diabetes mellitus without complications: Secondary | ICD-10-CM | POA: Insufficient documentation

## 2016-04-22 DIAGNOSIS — Z79899 Other long term (current) drug therapy: Secondary | ICD-10-CM | POA: Diagnosis not present

## 2016-04-22 DIAGNOSIS — N189 Chronic kidney disease, unspecified: Secondary | ICD-10-CM

## 2016-04-22 DIAGNOSIS — K922 Gastrointestinal hemorrhage, unspecified: Secondary | ICD-10-CM | POA: Diagnosis not present

## 2016-04-22 DIAGNOSIS — Z87891 Personal history of nicotine dependence: Secondary | ICD-10-CM | POA: Insufficient documentation

## 2016-04-22 DIAGNOSIS — J449 Chronic obstructive pulmonary disease, unspecified: Secondary | ICD-10-CM | POA: Diagnosis not present

## 2016-04-22 DIAGNOSIS — D631 Anemia in chronic kidney disease: Secondary | ICD-10-CM | POA: Insufficient documentation

## 2016-04-22 DIAGNOSIS — I129 Hypertensive chronic kidney disease with stage 1 through stage 4 chronic kidney disease, or unspecified chronic kidney disease: Secondary | ICD-10-CM | POA: Insufficient documentation

## 2016-04-22 DIAGNOSIS — I2510 Atherosclerotic heart disease of native coronary artery without angina pectoris: Secondary | ICD-10-CM | POA: Insufficient documentation

## 2016-04-22 LAB — SAMPLE TO BLOOD BANK

## 2016-04-22 LAB — CBC WITH DIFFERENTIAL/PLATELET
BASOS ABS: 0 10*3/uL (ref 0–0.1)
BASOS PCT: 1 %
EOS ABS: 0.1 10*3/uL (ref 0–0.7)
Eosinophils Relative: 2 %
HCT: 25.7 % — ABNORMAL LOW (ref 35.0–47.0)
Hemoglobin: 8.6 g/dL — ABNORMAL LOW (ref 12.0–16.0)
Lymphocytes Relative: 11 %
Lymphs Abs: 0.5 10*3/uL — ABNORMAL LOW (ref 1.0–3.6)
MCH: 29.7 pg (ref 26.0–34.0)
MCHC: 33.4 g/dL (ref 32.0–36.0)
MCV: 88.9 fL (ref 80.0–100.0)
MONO ABS: 0.3 10*3/uL (ref 0.2–0.9)
MONOS PCT: 8 %
NEUTROS ABS: 3.2 10*3/uL (ref 1.4–6.5)
NEUTROS PCT: 78 %
PLATELETS: 96 10*3/uL — AB (ref 150–440)
RBC: 2.88 MIL/uL — ABNORMAL LOW (ref 3.80–5.20)
RDW: 17 % — AB (ref 11.5–14.5)
WBC: 4.1 10*3/uL (ref 3.6–11.0)

## 2016-04-29 ENCOUNTER — Inpatient Hospital Stay: Payer: Medicare Other

## 2016-04-29 ENCOUNTER — Ambulatory Visit: Payer: Medicare Other

## 2016-04-29 ENCOUNTER — Other Ambulatory Visit: Payer: Self-pay | Admitting: Internal Medicine

## 2016-04-29 ENCOUNTER — Encounter: Payer: Self-pay | Admitting: *Deleted

## 2016-04-29 ENCOUNTER — Other Ambulatory Visit: Payer: Self-pay | Admitting: *Deleted

## 2016-04-29 VITALS — BP 181/74 | HR 74 | Resp 20

## 2016-04-29 DIAGNOSIS — I129 Hypertensive chronic kidney disease with stage 1 through stage 4 chronic kidney disease, or unspecified chronic kidney disease: Secondary | ICD-10-CM | POA: Diagnosis not present

## 2016-04-29 DIAGNOSIS — N183 Chronic kidney disease, stage 3 unspecified: Secondary | ICD-10-CM

## 2016-04-29 DIAGNOSIS — D509 Iron deficiency anemia, unspecified: Secondary | ICD-10-CM

## 2016-04-29 DIAGNOSIS — D500 Iron deficiency anemia secondary to blood loss (chronic): Secondary | ICD-10-CM

## 2016-04-29 DIAGNOSIS — D631 Anemia in chronic kidney disease: Secondary | ICD-10-CM

## 2016-04-29 DIAGNOSIS — N189 Chronic kidney disease, unspecified: Secondary | ICD-10-CM

## 2016-04-29 LAB — CBC WITH DIFFERENTIAL/PLATELET
BASOS PCT: 1 %
Basophils Absolute: 0 10*3/uL (ref 0–0.1)
EOS ABS: 0.1 10*3/uL (ref 0–0.7)
Eosinophils Relative: 1 %
HEMATOCRIT: 23.5 % — AB (ref 35.0–47.0)
HEMOGLOBIN: 7.6 g/dL — AB (ref 12.0–16.0)
LYMPHS ABS: 0.4 10*3/uL — AB (ref 1.0–3.6)
Lymphocytes Relative: 9 %
MCH: 28.5 pg (ref 26.0–34.0)
MCHC: 32.2 g/dL (ref 32.0–36.0)
MCV: 88.5 fL (ref 80.0–100.0)
MONO ABS: 0.4 10*3/uL (ref 0.2–0.9)
MONOS PCT: 8 %
NEUTROS PCT: 81 %
Neutro Abs: 3.8 10*3/uL (ref 1.4–6.5)
Platelets: 116 10*3/uL — ABNORMAL LOW (ref 150–440)
RBC: 2.66 MIL/uL — ABNORMAL LOW (ref 3.80–5.20)
RDW: 17 % — AB (ref 11.5–14.5)
WBC: 4.7 10*3/uL (ref 3.6–11.0)

## 2016-04-29 LAB — SAMPLE TO BLOOD BANK

## 2016-04-29 MED ORDER — SODIUM CHLORIDE 0.9 % IV SOLN
Freq: Once | INTRAVENOUS | Status: AC
  Administered 2016-04-29: 12:00:00 via INTRAVENOUS
  Filled 2016-04-29: qty 1000

## 2016-04-29 MED ORDER — HEPARIN SOD (PORK) LOCK FLUSH 100 UNIT/ML IV SOLN
500.0000 [IU] | Freq: Once | INTRAVENOUS | Status: AC
  Administered 2016-04-29: 500 [IU] via INTRAVENOUS
  Filled 2016-04-29: qty 5

## 2016-04-29 MED ORDER — SODIUM CHLORIDE 0.9 % IV SOLN
510.0000 mg | Freq: Once | INTRAVENOUS | Status: AC
  Administered 2016-04-29: 510 mg via INTRAVENOUS
  Filled 2016-04-29: qty 17

## 2016-04-29 NOTE — Progress Notes (Signed)
Pt came today for lab only. hgb 7.6. Pt reports persistent bright red rectal bleeding and shortness of breath with minimal exertion . Pt continues to refuse aranesp. She reports that she is "hestitant to take any more blood transfusions unless medically necessary. I fear that I will continue to develop new antibodies with future blood. I rather have IV iron." RN spoke with Dr. Rogue Bussing. Pt agreeable to IV fereheme today. RN spoke with chemo charge nurse, pharmacy and prior auth team.-ok to proceed with IV fereheme. Pt requested to have labs for ferritin and IIBC levels added to next weeks labs. Orders obtained from Dr. Rogue Bussing for these add on labs for next week. Pt agreeable with plan of care.

## 2016-04-29 NOTE — Progress Notes (Signed)
Pt refusing further aranesp injections. MD made aware

## 2016-05-06 ENCOUNTER — Ambulatory Visit: Payer: Medicare Other

## 2016-05-06 ENCOUNTER — Other Ambulatory Visit: Payer: Self-pay

## 2016-05-06 ENCOUNTER — Inpatient Hospital Stay: Payer: Medicare Other

## 2016-05-06 ENCOUNTER — Other Ambulatory Visit: Payer: Self-pay | Admitting: *Deleted

## 2016-05-06 ENCOUNTER — Other Ambulatory Visit: Payer: Self-pay | Admitting: Internal Medicine

## 2016-05-06 DIAGNOSIS — D500 Iron deficiency anemia secondary to blood loss (chronic): Secondary | ICD-10-CM

## 2016-05-06 DIAGNOSIS — I129 Hypertensive chronic kidney disease with stage 1 through stage 4 chronic kidney disease, or unspecified chronic kidney disease: Secondary | ICD-10-CM | POA: Diagnosis not present

## 2016-05-06 DIAGNOSIS — N189 Chronic kidney disease, unspecified: Secondary | ICD-10-CM

## 2016-05-06 DIAGNOSIS — N183 Chronic kidney disease, stage 3 unspecified: Secondary | ICD-10-CM

## 2016-05-06 DIAGNOSIS — D509 Iron deficiency anemia, unspecified: Secondary | ICD-10-CM

## 2016-05-06 DIAGNOSIS — D631 Anemia in chronic kidney disease: Secondary | ICD-10-CM

## 2016-05-06 LAB — IRON AND TIBC
Iron: 85 ug/dL (ref 28–170)
SATURATION RATIOS: 25 % (ref 10.4–31.8)
TIBC: 345 ug/dL (ref 250–450)
UIBC: 260 ug/dL

## 2016-05-06 LAB — CBC
HCT: 21 % — ABNORMAL LOW (ref 35.0–47.0)
Hemoglobin: 7.1 g/dL — ABNORMAL LOW (ref 12.0–16.0)
MCH: 30.5 pg (ref 26.0–34.0)
MCHC: 33.6 g/dL (ref 32.0–36.0)
MCV: 90.9 fL (ref 80.0–100.0)
PLATELETS: 125 10*3/uL — AB (ref 150–440)
RBC: 2.31 MIL/uL — ABNORMAL LOW (ref 3.80–5.20)
RDW: 19.5 % — ABNORMAL HIGH (ref 11.5–14.5)
WBC: 4.7 10*3/uL (ref 3.6–11.0)

## 2016-05-06 LAB — FERRITIN

## 2016-05-06 LAB — SAMPLE TO BLOOD BANK

## 2016-05-06 LAB — PREPARE RBC (CROSSMATCH)

## 2016-05-07 ENCOUNTER — Inpatient Hospital Stay: Payer: Medicare Other

## 2016-05-07 ENCOUNTER — Other Ambulatory Visit: Payer: Self-pay | Admitting: Internal Medicine

## 2016-05-07 VITALS — BP 124/75 | HR 65 | Temp 96.0°F | Resp 20

## 2016-05-07 DIAGNOSIS — I129 Hypertensive chronic kidney disease with stage 1 through stage 4 chronic kidney disease, or unspecified chronic kidney disease: Secondary | ICD-10-CM | POA: Diagnosis not present

## 2016-05-07 DIAGNOSIS — N183 Chronic kidney disease, stage 3 unspecified: Secondary | ICD-10-CM

## 2016-05-07 DIAGNOSIS — D500 Iron deficiency anemia secondary to blood loss (chronic): Secondary | ICD-10-CM

## 2016-05-07 MED ORDER — FUROSEMIDE 10 MG/ML IJ SOLN
20.0000 mg | Freq: Once | INTRAMUSCULAR | Status: AC
  Administered 2016-05-07: 20 mg via INTRAMUSCULAR
  Filled 2016-05-07: qty 2

## 2016-05-07 MED ORDER — ACETAMINOPHEN 325 MG PO TABS
650.0000 mg | ORAL_TABLET | Freq: Once | ORAL | Status: AC
  Administered 2016-05-07: 650 mg via ORAL
  Filled 2016-05-07: qty 2

## 2016-05-07 MED ORDER — SODIUM CHLORIDE 0.9% FLUSH
10.0000 mL | INTRAVENOUS | Status: AC | PRN
  Administered 2016-05-07: 10 mL
  Filled 2016-05-07: qty 10

## 2016-05-07 MED ORDER — SODIUM CHLORIDE 0.9 % IV SOLN
250.0000 mL | Freq: Once | INTRAVENOUS | Status: AC
  Administered 2016-05-07: 250 mL via INTRAVENOUS
  Filled 2016-05-07: qty 250

## 2016-05-07 MED ORDER — DIPHENHYDRAMINE HCL 25 MG PO CAPS
25.0000 mg | ORAL_CAPSULE | Freq: Once | ORAL | Status: AC
  Administered 2016-05-07: 25 mg via ORAL
  Filled 2016-05-07: qty 1

## 2016-05-07 MED ORDER — FUROSEMIDE 10 MG/ML IJ SOLN: 20.0000 mg | Freq: Once | INTRAMUSCULAR | Status: DC

## 2016-05-07 MED ORDER — HEPARIN SOD (PORK) LOCK FLUSH 100 UNIT/ML IV SOLN
500.0000 [IU] | Freq: Every day | INTRAVENOUS | Status: AC | PRN
  Administered 2016-05-07: 500 [IU]
  Filled 2016-05-07 (×2): qty 5

## 2016-05-09 LAB — TYPE AND SCREEN
ABO/RH(D): B POS
ANTIBODY SCREEN: NEGATIVE
Unit division: 0

## 2016-05-13 ENCOUNTER — Inpatient Hospital Stay (HOSPITAL_BASED_OUTPATIENT_CLINIC_OR_DEPARTMENT_OTHER): Payer: Medicare Other | Admitting: Internal Medicine

## 2016-05-13 ENCOUNTER — Inpatient Hospital Stay: Payer: Medicare Other

## 2016-05-13 ENCOUNTER — Ambulatory Visit: Payer: Medicare Other

## 2016-05-13 DIAGNOSIS — D500 Iron deficiency anemia secondary to blood loss (chronic): Secondary | ICD-10-CM

## 2016-05-13 DIAGNOSIS — D631 Anemia in chronic kidney disease: Secondary | ICD-10-CM

## 2016-05-13 DIAGNOSIS — N183 Chronic kidney disease, stage 3 unspecified: Secondary | ICD-10-CM

## 2016-05-13 DIAGNOSIS — I129 Hypertensive chronic kidney disease with stage 1 through stage 4 chronic kidney disease, or unspecified chronic kidney disease: Secondary | ICD-10-CM | POA: Diagnosis not present

## 2016-05-13 DIAGNOSIS — D509 Iron deficiency anemia, unspecified: Secondary | ICD-10-CM

## 2016-05-13 DIAGNOSIS — N189 Chronic kidney disease, unspecified: Secondary | ICD-10-CM

## 2016-05-13 DIAGNOSIS — K922 Gastrointestinal hemorrhage, unspecified: Secondary | ICD-10-CM | POA: Diagnosis not present

## 2016-05-13 DIAGNOSIS — Z79899 Other long term (current) drug therapy: Secondary | ICD-10-CM | POA: Diagnosis not present

## 2016-05-13 LAB — CBC WITH DIFFERENTIAL/PLATELET
BASOS ABS: 0.1 10*3/uL (ref 0–0.1)
BASOS PCT: 1 %
EOS ABS: 0.1 10*3/uL (ref 0–0.7)
EOS PCT: 1 %
HEMATOCRIT: 24 % — AB (ref 35.0–47.0)
Hemoglobin: 7.9 g/dL — ABNORMAL LOW (ref 12.0–16.0)
Lymphocytes Relative: 10 %
Lymphs Abs: 0.5 10*3/uL — ABNORMAL LOW (ref 1.0–3.6)
MCH: 30.9 pg (ref 26.0–34.0)
MCHC: 33 g/dL (ref 32.0–36.0)
MCV: 93.5 fL (ref 80.0–100.0)
MONO ABS: 0.3 10*3/uL (ref 0.2–0.9)
MONOS PCT: 7 %
NEUTROS ABS: 3.9 10*3/uL (ref 1.4–6.5)
Neutrophils Relative %: 81 %
PLATELETS: 107 10*3/uL — AB (ref 150–440)
RBC: 2.57 MIL/uL — ABNORMAL LOW (ref 3.80–5.20)
RDW: 20 % — AB (ref 11.5–14.5)
WBC: 4.8 10*3/uL (ref 3.6–11.0)

## 2016-05-13 LAB — SAMPLE TO BLOOD BANK

## 2016-05-13 MED ORDER — EPOETIN ALFA 20000 UNIT/ML IJ SOLN
20000.0000 [IU] | Freq: Once | INTRAMUSCULAR | Status: AC
  Administered 2016-05-13: 20000 [IU] via SUBCUTANEOUS
  Filled 2016-05-13: qty 1

## 2016-05-13 NOTE — Progress Notes (Signed)
[place] OFFICE PROGRESS NOTE  [name] Care Team: Kirk Ruths, MD as PCP - General (Internal Medicine) Cammie Sickle, MD as Consulting Physician (Hematology and Oncology)   SUMMARY OF HEMATOLOGIC HISTORY:  # CHRONIC ANEMIA sec Hx GIB/CKD [ CKD STAGE III]; Jan-2016 [colo/EGD- Dr.Elliot]; capsule x2 - angiodysplasia; BMBx- 1999; IV iron & procrit;   # CHRONIC MILD THROMBOCYTOPENIA-   # CKD [creat 1.4-1.7]  # CHRONIC CELLULITIS/ AORTIC STENOSIS/ LIMITED MOBILITY   INTERVAL HISTORY:  Morgan [name] with multiple medical problems and above history of chronic anemia from chronic GI blood loss/chronic kidney disease on iron and Procrit is here for follow-up.  [name] declined Procrit [name] declined Aranesp secondary diarrhea- also states her "the shots make her lose blood per rectum".  She complains of extreme fatigue. Denies any blood in stools. No nausea or vomiting.  Swelling in legs are improved.   REVIEW OF SYSTEMS:  A complete 10 point review of system is done which is negative except mentioned above/history of present illness.   PAST MEDICAL HISTORY :  Past Medical History  Diagnosis Date  . Hypertension   . Cervical cancer ([place])     cervical cancer history  . Iron deficiency anemia 2008  . GI bleed   . History of blood transfusion   . Bilateral cellulitis of lower leg   . CAD (coronary artery disease)   . Fatty liver   . Hyperlipidemia   . GERD (gastroesophageal reflux disease)   . History of nicotine use   . Chronic edema   . Diabetes mellitus with stage 3 chronic kidney disease ([place])   . Trigeminal neuralgia   . Aortic stenosis   . Cardiomyopathy ([place])   . Gout   . Peptic ulcer disease   . Angiodysplasia   . Vascular malformation 06/14/1997  . Adenomatous polyps 03/05/2012  . COPD (chronic obstructive pulmonary disease) ([place])   . CHF (congestive heart failure) ([place])   . Fibrocystic breast changes   . Irritable  bowel syndrome   . Arthritis   . Carotid bruit   . Tobacco abuse   . Osteoarthritis   . Macrocytosis   . Abnormal LFTs (liver function tests)   . PVD (peripheral vascular disease) ([place])   . AV malformation of GI tract   . Clostridium difficile infection March 02, 2015    PAST SURGICAL HISTORY :   Past Surgical History  Procedure Laterality Date  . Abdominal hysterectomy    . Bone marrow biopsy  1999  . Angiogram cardiac stenting    . Thoracentesis    . Esophagogastroduodenoscopy  12/2014, 2014, 2013, 2009, 2004  . Colonoscopy  12/2014, 2014, 2013, 2008, 2004    03/05/2012-Adenomatous Polyps;09/23/2003-Polyp remains intact;   . Cholecystectomy    . Tonsillectomy    . Nasal sinus surgery    . Endoscopic carpal tunnel release    . Tee without cardioversion N/A 12/19/2015    Procedure: TRANSESOPHAGEAL ECHOCARDIOGRAM (TEE);  Surgeon: Yolonda Kida, MD;  Location: [facility] ORS;  Service: Cardiovascular;  Laterality: N/A;    FAMILY HISTORY :   Family History  Problem Relation Age of Onset  . Parkinsonism Mother   . Heart attack Mother   . Lung cancer Father   . Diabetes Mellitus II Father   . Asthma Sister   . Asthma Brother   . Heart disease Son     SOCIAL HISTORY:   Social History  Substance Use Topics  . Smoking status: Former  Smoker -- 0.50 packs/day for 56 years    Types: Cigarettes    Quit date: 05/23/2013  . Smokeless tobacco: Never Used  . Alcohol Use: No    ALLERGIES:  is allergic to aspirin; atenolol; clopidogrel bisulfate; darvon; hydrochlorothiazide; iodinated diagnostic agents; nylon; other; and pravastatin.  MEDICATIONS:  Current Outpatient Prescriptions  Medication Sig Dispense Refill  . lisinopril (PRINIVIL,ZESTRIL) 40 MG tablet Take 40 mg by mouth daily.    . metolazone (ZAROXOLYN) 5 MG tablet Take 5 mg by mouth 2 (two) times a week.    . metoprolol succinate (TOPROL-XL) 50 MG 24 hr tablet Take 1 tablet by mouth daily.    . pantoprazole (PROTONIX)  20 MG tablet Take 20 mg by mouth daily.    . pantoprazole (PROTONIX) 40 MG tablet Take 1 tablet by mouth daily. Reported on 12/19/2015    . potassium chloride (K-DUR) 10 MEQ tablet Take 10 mEq by mouth 3 (three) times a week.    . simvastatin (ZOCOR) 20 MG tablet Take 20 mg by mouth at bedtime.     . torsemide (DEMADEX) 20 MG tablet Take 1 tablet by mouth 3 (three) times daily.     No current facility-administered medications for this visit.    PHYSICAL EXAMINATION: ECOG PERFORMANCE STATUS: 2 - Symptomatic, <50% confined to bed  BP 145/81 mmHg  Pulse 66  Temp(Src) 95.6 F (35.3 C) (Tympanic)  Resp 18  Wt 186 lb 15.2 oz (84.8 kg)  Filed Weights   05/13/16 1001  Weight: 186 lb 15.2 oz (84.8 kg)    GENERAL: Well-nourished well-developed; Alert, no distress and comfortable.   She is accompanied by her brother. She is in a wheelchair. She cannot sit on the exam table.  EYES: Positive for pallor. OROPHARYNX: no thrush or ulceration; good dentition  NECK: supple, no masses felt LYMPH:  no palpable lymphadenopathy in the cervical,  LUNGS: clear to auscultation and  No wheeze or crackles HEART/CVS: regular rate & rhythm and positive for systolic murmurs; bilateral lower extremity swelling ; left larger than the right /chronic venous stasis changes noted.  ABDOMEN:abdomen soft, non-tender and normal bowel sounds Musculoskeletal:no cyanosis of digits and no clubbing  PSYCH: alert & oriented x 3 with fluent speech NEURO: no focal motor/sensory deficits SKIN:  erythema of the left lower extremity /improving.   LABORATORY DATA:  I have reviewed the data as listed    Component Value Date/Time   NA 129* 03/18/2016 0954   NA 139 01/18/2015 0545   K 4.1 03/18/2016 0954   K 3.9 01/18/2015 0545   CL 97* 03/18/2016 0954   CL 105 01/18/2015 0545   CO2 25 03/18/2016 0954   CO2 25 01/18/2015 0545   GLUCOSE 253* 03/18/2016 0954   GLUCOSE 179* 01/18/2015 0545   BUN 44* 03/18/2016 0954   BUN  29* 01/18/2015 0545   CREATININE 2.08* 03/18/2016 0954   CREATININE 1.80* 01/18/2015 0545   CALCIUM 7.8* 03/18/2016 0954   CALCIUM 8.7 01/18/2015 0545   PROT 7.1 01/19/2016 1009   PROT 6.6 06/20/2014 1101   ALBUMIN 3.1* 01/19/2016 1009   ALBUMIN 3.1* 06/20/2014 1101   AST 20 01/19/2016 1009   AST 32 06/20/2014 1101   ALT 15 01/19/2016 1009   ALT 31 06/20/2014 1101   ALKPHOS 115 01/19/2016 1009   ALKPHOS 125* 06/20/2014 1101   BILITOT 0.6 01/19/2016 1009   BILITOT 0.3 06/20/2014 1101   GFRNONAA 22* 03/18/2016 0954   GFRNONAA 29* 01/18/2015 0545   GFRNONAA  28* 06/20/2014 1101   GFRAA 25* 03/18/2016 0954   GFRAA 35* 01/18/2015 0545   GFRAA 33* 06/20/2014 1101    No results found for: SPEP, UPEP  Lab Results  Component Value Date   WBC 4.8 05/13/2016   NEUTROABS 3.9 05/13/2016   HGB 7.9* 05/13/2016   HCT 24.0* 05/13/2016   MCV 93.5 05/13/2016   PLT 107* 05/13/2016      Chemistry      Component Value Date/Time   NA 129* 03/18/2016 0954   NA 139 01/18/2015 0545   K 4.1 03/18/2016 0954   K 3.9 01/18/2015 0545   CL 97* 03/18/2016 0954   CL 105 01/18/2015 0545   CO2 25 03/18/2016 0954   CO2 25 01/18/2015 0545   BUN 44* 03/18/2016 0954   BUN 29* 01/18/2015 0545   CREATININE 2.08* 03/18/2016 0954   CREATININE 1.80* 01/18/2015 0545      Component Value Date/Time   CALCIUM 7.8* 03/18/2016 0954   CALCIUM 8.7 01/18/2015 0545   ALKPHOS 115 01/19/2016 1009   ALKPHOS 125* 06/20/2014 1101   AST 20 01/19/2016 1009   AST 32 06/20/2014 1101   ALT 15 01/19/2016 1009   ALT 31 06/20/2014 1101   BILITOT 0.6 01/19/2016 1009   BILITOT 0.3 06/20/2014 1101        ASSESSMENT & PLAN:   # CHRONIC ANEMIA- secondary to GI bleed/AV malformations; iron deficiency/ chronic kidney disease. [name] has been off Aranesp/Procrit for the last many months- secondary to intolerance. Today hemoglobin is 7.9; ferritin 1200. Long discussion regarding re-starting procrit- [name] finally  agreeable.  # Also given the symptomatic anemia recommend 1 unit of PRBC tomorrow.  # Chronic kidney disease- creatinine-  ~2.   # Check H&H every week; Procrit weekly.; IV iron monthly. Follow-up with me in 6 weeks/CBC CMP iron studies.   # 25 minutes face-to-face with the [name] discussing the above plan of care; more than 50% of time spent on prognosis/ natural history; counseling and coordination.     Cammie Sickle, MD 05/13/2016 10:24 AM

## 2016-05-14 ENCOUNTER — Inpatient Hospital Stay: Payer: Medicare Other

## 2016-05-14 VITALS — BP 109/63 | HR 62 | Temp 96.9°F | Resp 18

## 2016-05-14 DIAGNOSIS — D500 Iron deficiency anemia secondary to blood loss (chronic): Secondary | ICD-10-CM

## 2016-05-14 DIAGNOSIS — I129 Hypertensive chronic kidney disease with stage 1 through stage 4 chronic kidney disease, or unspecified chronic kidney disease: Secondary | ICD-10-CM | POA: Diagnosis not present

## 2016-05-14 LAB — PREPARE RBC (CROSSMATCH)

## 2016-05-14 MED ORDER — SODIUM CHLORIDE 0.9 % IV SOLN
250.0000 mL | Freq: Once | INTRAVENOUS | Status: AC
  Administered 2016-05-14: 250 mL via INTRAVENOUS
  Filled 2016-05-14: qty 250

## 2016-05-14 MED ORDER — DIPHENHYDRAMINE HCL 25 MG PO CAPS
25.0000 mg | ORAL_CAPSULE | Freq: Once | ORAL | Status: AC
  Administered 2016-05-14: 25 mg via ORAL
  Filled 2016-05-14: qty 1

## 2016-05-14 MED ORDER — SODIUM CHLORIDE 0.9% FLUSH
10.0000 mL | INTRAVENOUS | Status: AC | PRN
  Administered 2016-05-14: 10 mL
  Filled 2016-05-14: qty 10

## 2016-05-14 MED ORDER — HEPARIN SOD (PORK) LOCK FLUSH 100 UNIT/ML IV SOLN
500.0000 [IU] | Freq: Every day | INTRAVENOUS | Status: AC | PRN
  Administered 2016-05-14: 500 [IU]
  Filled 2016-05-14: qty 5

## 2016-05-14 MED ORDER — ACETAMINOPHEN 325 MG PO TABS
650.0000 mg | ORAL_TABLET | Freq: Once | ORAL | Status: AC
  Administered 2016-05-14: 650 mg via ORAL
  Filled 2016-05-14: qty 2

## 2016-05-15 LAB — TYPE AND SCREEN
ABO/RH(D): B POS
Antibody Screen: NEGATIVE
UNIT DIVISION: 0

## 2016-05-21 ENCOUNTER — Inpatient Hospital Stay: Payer: Medicare Other

## 2016-05-21 VITALS — BP 123/65 | HR 120 | Temp 97.0°F | Resp 18

## 2016-05-21 DIAGNOSIS — D500 Iron deficiency anemia secondary to blood loss (chronic): Secondary | ICD-10-CM

## 2016-05-21 DIAGNOSIS — D631 Anemia in chronic kidney disease: Secondary | ICD-10-CM

## 2016-05-21 DIAGNOSIS — I129 Hypertensive chronic kidney disease with stage 1 through stage 4 chronic kidney disease, or unspecified chronic kidney disease: Secondary | ICD-10-CM | POA: Diagnosis not present

## 2016-05-21 DIAGNOSIS — D509 Iron deficiency anemia, unspecified: Secondary | ICD-10-CM

## 2016-05-21 DIAGNOSIS — N189 Chronic kidney disease, unspecified: Secondary | ICD-10-CM

## 2016-05-21 DIAGNOSIS — N183 Chronic kidney disease, stage 3 unspecified: Secondary | ICD-10-CM

## 2016-05-21 LAB — CBC WITH DIFFERENTIAL/PLATELET
BASOS ABS: 0.1 10*3/uL (ref 0–0.1)
BASOS PCT: 1 %
EOS ABS: 0.1 10*3/uL (ref 0–0.7)
EOS PCT: 1 %
HEMATOCRIT: 28.3 % — AB (ref 35.0–47.0)
Hemoglobin: 9.4 g/dL — ABNORMAL LOW (ref 12.0–16.0)
Lymphocytes Relative: 11 %
Lymphs Abs: 0.5 10*3/uL — ABNORMAL LOW (ref 1.0–3.6)
MCH: 30.9 pg (ref 26.0–34.0)
MCHC: 33 g/dL (ref 32.0–36.0)
MCV: 93.4 fL (ref 80.0–100.0)
MONO ABS: 0.4 10*3/uL (ref 0.2–0.9)
MONOS PCT: 8 %
NEUTROS ABS: 3.9 10*3/uL (ref 1.4–6.5)
Neutrophils Relative %: 79 %
PLATELETS: 110 10*3/uL — AB (ref 150–440)
RBC: 3.03 MIL/uL — ABNORMAL LOW (ref 3.80–5.20)
RDW: 17.4 % — AB (ref 11.5–14.5)
WBC: 5 10*3/uL (ref 3.6–11.0)

## 2016-05-21 LAB — FERRITIN: FERRITIN: 48 ng/mL (ref 11–307)

## 2016-05-21 LAB — SAMPLE TO BLOOD BANK

## 2016-05-21 MED ORDER — EPOETIN ALFA 20000 UNIT/ML IJ SOLN
20000.0000 [IU] | Freq: Once | INTRAMUSCULAR | Status: AC
  Administered 2016-05-21: 20000 [IU] via SUBCUTANEOUS
  Filled 2016-05-21: qty 1

## 2016-05-27 ENCOUNTER — Inpatient Hospital Stay: Payer: Medicare Other

## 2016-05-27 ENCOUNTER — Inpatient Hospital Stay: Payer: Medicare Other | Attending: Internal Medicine

## 2016-05-27 VITALS — BP 143/73 | HR 60 | Temp 96.7°F | Resp 18

## 2016-05-27 DIAGNOSIS — N183 Chronic kidney disease, stage 3 (moderate): Secondary | ICD-10-CM | POA: Insufficient documentation

## 2016-05-27 DIAGNOSIS — K922 Gastrointestinal hemorrhage, unspecified: Secondary | ICD-10-CM | POA: Insufficient documentation

## 2016-05-27 DIAGNOSIS — Z79899 Other long term (current) drug therapy: Secondary | ICD-10-CM | POA: Insufficient documentation

## 2016-05-27 DIAGNOSIS — D631 Anemia in chronic kidney disease: Secondary | ICD-10-CM

## 2016-05-27 DIAGNOSIS — D500 Iron deficiency anemia secondary to blood loss (chronic): Secondary | ICD-10-CM

## 2016-05-27 DIAGNOSIS — N189 Chronic kidney disease, unspecified: Secondary | ICD-10-CM

## 2016-05-27 LAB — CBC WITH DIFFERENTIAL/PLATELET
BASOS ABS: 0.1 10*3/uL (ref 0–0.1)
BASOS PCT: 1 %
Eosinophils Absolute: 0 10*3/uL (ref 0–0.7)
Eosinophils Relative: 1 %
HEMATOCRIT: 26.4 % — AB (ref 35.0–47.0)
HEMOGLOBIN: 8.7 g/dL — AB (ref 12.0–16.0)
LYMPHS PCT: 11 %
Lymphs Abs: 0.5 10*3/uL — ABNORMAL LOW (ref 1.0–3.6)
MCH: 30.5 pg (ref 26.0–34.0)
MCHC: 33.1 g/dL (ref 32.0–36.0)
MCV: 92.1 fL (ref 80.0–100.0)
MONO ABS: 0.4 10*3/uL (ref 0.2–0.9)
MONOS PCT: 8 %
NEUTROS ABS: 3.8 10*3/uL (ref 1.4–6.5)
Neutrophils Relative %: 79 %
Platelets: 115 10*3/uL — ABNORMAL LOW (ref 150–440)
RBC: 2.87 MIL/uL — ABNORMAL LOW (ref 3.80–5.20)
RDW: 16.3 % — AB (ref 11.5–14.5)
WBC: 4.8 10*3/uL (ref 3.6–11.0)

## 2016-05-27 LAB — SAMPLE TO BLOOD BANK

## 2016-05-27 MED ORDER — EPOETIN ALFA 20000 UNIT/ML IJ SOLN
20000.0000 [IU] | Freq: Once | INTRAMUSCULAR | Status: AC
  Administered 2016-05-27: 20000 [IU] via SUBCUTANEOUS
  Filled 2016-05-27: qty 1

## 2016-05-28 ENCOUNTER — Inpatient Hospital Stay: Payer: Medicare Other

## 2016-05-28 VITALS — BP 127/72 | HR 66 | Temp 97.1°F | Resp 18

## 2016-05-28 DIAGNOSIS — D509 Iron deficiency anemia, unspecified: Secondary | ICD-10-CM

## 2016-05-28 DIAGNOSIS — N183 Chronic kidney disease, stage 3 (moderate): Secondary | ICD-10-CM | POA: Diagnosis not present

## 2016-05-28 DIAGNOSIS — D500 Iron deficiency anemia secondary to blood loss (chronic): Secondary | ICD-10-CM

## 2016-05-28 MED ORDER — SODIUM CHLORIDE 0.9 % IJ SOLN
10.0000 mL | INTRAMUSCULAR | Status: DC | PRN
  Filled 2016-05-28: qty 10

## 2016-05-28 MED ORDER — HEPARIN SOD (PORK) LOCK FLUSH 100 UNIT/ML IV SOLN
500.0000 [IU] | Freq: Once | INTRAVENOUS | Status: AC | PRN
  Administered 2016-05-28: 500 [IU]
  Filled 2016-05-28: qty 5

## 2016-05-28 MED ORDER — SODIUM CHLORIDE 0.9 % IV SOLN
510.0000 mg | Freq: Once | INTRAVENOUS | Status: AC
  Administered 2016-05-28: 510 mg via INTRAVENOUS
  Filled 2016-05-28: qty 17

## 2016-05-28 MED ORDER — SODIUM CHLORIDE 0.9 % IV SOLN
Freq: Once | INTRAVENOUS | Status: AC
  Administered 2016-05-28: 14:00:00 via INTRAVENOUS
  Filled 2016-05-28: qty 1000

## 2016-06-03 ENCOUNTER — Inpatient Hospital Stay: Payer: Medicare Other

## 2016-06-03 VITALS — BP 123/69 | HR 69 | Temp 97.2°F | Resp 18

## 2016-06-03 DIAGNOSIS — N183 Chronic kidney disease, stage 3 (moderate): Secondary | ICD-10-CM | POA: Diagnosis not present

## 2016-06-03 DIAGNOSIS — N189 Chronic kidney disease, unspecified: Secondary | ICD-10-CM

## 2016-06-03 DIAGNOSIS — D509 Iron deficiency anemia, unspecified: Secondary | ICD-10-CM

## 2016-06-03 DIAGNOSIS — D500 Iron deficiency anemia secondary to blood loss (chronic): Secondary | ICD-10-CM

## 2016-06-03 DIAGNOSIS — D631 Anemia in chronic kidney disease: Secondary | ICD-10-CM

## 2016-06-03 LAB — CBC WITH DIFFERENTIAL/PLATELET
BASOS ABS: 0 10*3/uL (ref 0–0.1)
Basophils Relative: 1 %
EOS ABS: 0.1 10*3/uL (ref 0–0.7)
Eosinophils Relative: 1 %
HEMATOCRIT: 24.7 % — AB (ref 35.0–47.0)
HEMOGLOBIN: 8.1 g/dL — AB (ref 12.0–16.0)
Lymphocytes Relative: 8 %
Lymphs Abs: 0.4 10*3/uL — ABNORMAL LOW (ref 1.0–3.6)
MCH: 30.6 pg (ref 26.0–34.0)
MCHC: 32.8 g/dL (ref 32.0–36.0)
MCV: 93.2 fL (ref 80.0–100.0)
MONOS PCT: 8 %
Monocytes Absolute: 0.4 10*3/uL (ref 0.2–0.9)
NEUTROS ABS: 4 10*3/uL (ref 1.4–6.5)
NEUTROS PCT: 82 %
Platelets: 120 10*3/uL — ABNORMAL LOW (ref 150–440)
RBC: 2.65 MIL/uL — ABNORMAL LOW (ref 3.80–5.20)
RDW: 16.4 % — AB (ref 11.5–14.5)
WBC: 4.9 10*3/uL (ref 3.6–11.0)

## 2016-06-03 LAB — SAMPLE TO BLOOD BANK

## 2016-06-03 MED ORDER — SODIUM CHLORIDE 0.9 % IJ SOLN
3.0000 mL | Freq: Once | INTRAMUSCULAR | Status: DC | PRN
  Filled 2016-06-03: qty 10

## 2016-06-03 MED ORDER — EPOETIN ALFA 20000 UNIT/ML IJ SOLN
20000.0000 [IU] | Freq: Once | INTRAMUSCULAR | Status: AC
  Administered 2016-06-03: 20000 [IU] via SUBCUTANEOUS
  Filled 2016-06-03: qty 1

## 2016-06-03 MED ORDER — ALTEPLASE 2 MG IJ SOLR: 2.0000 mg | Freq: Once | INTRAMUSCULAR | Status: DC | PRN

## 2016-06-03 MED ORDER — HEPARIN SOD (PORK) LOCK FLUSH 100 UNIT/ML IV SOLN
250.0000 [IU] | Freq: Once | INTRAVENOUS | Status: DC | PRN
Start: 2016-06-03 — End: 2016-06-03

## 2016-06-03 NOTE — Progress Notes (Signed)
Patient reports she is having symptoms of chest tightness and throat tightness with activity with the last episode this morning while getting ready for appointment.  I spoke with Dr. Rogue Bussing and he would like to proceed with Procrit injections.  Advised patient to call if symptoms worsen or does not show any improvement, patient agreed.

## 2016-06-10 ENCOUNTER — Inpatient Hospital Stay: Payer: Medicare Other

## 2016-06-10 VITALS — BP 124/75 | HR 65 | Resp 16

## 2016-06-10 DIAGNOSIS — N189 Chronic kidney disease, unspecified: Secondary | ICD-10-CM

## 2016-06-10 DIAGNOSIS — D500 Iron deficiency anemia secondary to blood loss (chronic): Secondary | ICD-10-CM

## 2016-06-10 DIAGNOSIS — D631 Anemia in chronic kidney disease: Secondary | ICD-10-CM

## 2016-06-10 DIAGNOSIS — N183 Chronic kidney disease, stage 3 (moderate): Secondary | ICD-10-CM | POA: Diagnosis not present

## 2016-06-10 LAB — CBC WITH DIFFERENTIAL/PLATELET
BASOS PCT: 1 %
Basophils Absolute: 0.1 10*3/uL (ref 0–0.1)
EOS ABS: 0.1 10*3/uL (ref 0–0.7)
Eosinophils Relative: 2 %
HEMATOCRIT: 26.8 % — AB (ref 35.0–47.0)
HEMOGLOBIN: 8.8 g/dL — AB (ref 12.0–16.0)
Lymphocytes Relative: 9 %
Lymphs Abs: 0.5 10*3/uL — ABNORMAL LOW (ref 1.0–3.6)
MCH: 30.4 pg (ref 26.0–34.0)
MCHC: 32.9 g/dL (ref 32.0–36.0)
MCV: 92.4 fL (ref 80.0–100.0)
MONOS PCT: 7 %
Monocytes Absolute: 0.4 10*3/uL (ref 0.2–0.9)
NEUTROS ABS: 4.6 10*3/uL (ref 1.4–6.5)
Neutrophils Relative %: 81 %
Platelets: 106 10*3/uL — ABNORMAL LOW (ref 150–440)
RBC: 2.9 MIL/uL — AB (ref 3.80–5.20)
RDW: 17.7 % — ABNORMAL HIGH (ref 11.5–14.5)
WBC: 5.6 10*3/uL (ref 3.6–11.0)

## 2016-06-10 LAB — SAMPLE TO BLOOD BANK

## 2016-06-10 MED ORDER — EPOETIN ALFA 20000 UNIT/ML IJ SOLN
20000.0000 [IU] | Freq: Once | INTRAMUSCULAR | Status: AC
  Administered 2016-06-10: 20000 [IU] via SUBCUTANEOUS
  Filled 2016-06-10: qty 1

## 2016-06-17 ENCOUNTER — Inpatient Hospital Stay: Payer: Medicare Other

## 2016-06-17 VITALS — BP 119/76 | HR 68 | Resp 18

## 2016-06-17 DIAGNOSIS — D500 Iron deficiency anemia secondary to blood loss (chronic): Secondary | ICD-10-CM

## 2016-06-17 DIAGNOSIS — N183 Chronic kidney disease, stage 3 (moderate): Secondary | ICD-10-CM | POA: Diagnosis not present

## 2016-06-17 DIAGNOSIS — N189 Chronic kidney disease, unspecified: Secondary | ICD-10-CM

## 2016-06-17 DIAGNOSIS — D631 Anemia in chronic kidney disease: Secondary | ICD-10-CM

## 2016-06-17 LAB — CBC WITH DIFFERENTIAL/PLATELET
Basophils Absolute: 0.1 10*3/uL (ref 0–0.1)
Basophils Relative: 1 %
EOS PCT: 1 %
Eosinophils Absolute: 0.1 10*3/uL (ref 0–0.7)
HCT: 27.1 % — ABNORMAL LOW (ref 35.0–47.0)
Hemoglobin: 9 g/dL — ABNORMAL LOW (ref 12.0–16.0)
LYMPHS ABS: 0.5 10*3/uL — AB (ref 1.0–3.6)
LYMPHS PCT: 10 %
MCH: 30.8 pg (ref 26.0–34.0)
MCHC: 33.2 g/dL (ref 32.0–36.0)
MCV: 92.8 fL (ref 80.0–100.0)
MONO ABS: 0.4 10*3/uL (ref 0.2–0.9)
MONOS PCT: 9 %
Neutro Abs: 3.7 10*3/uL (ref 1.4–6.5)
Neutrophils Relative %: 79 %
PLATELETS: 118 10*3/uL — AB (ref 150–440)
RBC: 2.92 MIL/uL — ABNORMAL LOW (ref 3.80–5.20)
RDW: 17.2 % — AB (ref 11.5–14.5)
WBC: 4.7 10*3/uL (ref 3.6–11.0)

## 2016-06-17 LAB — SAMPLE TO BLOOD BANK

## 2016-06-17 MED ORDER — EPOETIN ALFA 20000 UNIT/ML IJ SOLN
20000.0000 [IU] | Freq: Once | INTRAMUSCULAR | Status: AC
  Administered 2016-06-17: 20000 [IU] via SUBCUTANEOUS
  Filled 2016-06-17: qty 1

## 2016-06-24 ENCOUNTER — Inpatient Hospital Stay: Payer: Medicare Other

## 2016-06-24 ENCOUNTER — Inpatient Hospital Stay: Payer: Medicare Other | Attending: Internal Medicine | Admitting: Internal Medicine

## 2016-06-24 VITALS — BP 90/55 | HR 73 | Temp 97.0°F | Resp 18 | Wt 188.0 lb

## 2016-06-24 DIAGNOSIS — K589 Irritable bowel syndrome without diarrhea: Secondary | ICD-10-CM | POA: Insufficient documentation

## 2016-06-24 DIAGNOSIS — I2510 Atherosclerotic heart disease of native coronary artery without angina pectoris: Secondary | ICD-10-CM | POA: Insufficient documentation

## 2016-06-24 DIAGNOSIS — D631 Anemia in chronic kidney disease: Secondary | ICD-10-CM

## 2016-06-24 DIAGNOSIS — E785 Hyperlipidemia, unspecified: Secondary | ICD-10-CM | POA: Insufficient documentation

## 2016-06-24 DIAGNOSIS — I509 Heart failure, unspecified: Secondary | ICD-10-CM | POA: Insufficient documentation

## 2016-06-24 DIAGNOSIS — K219 Gastro-esophageal reflux disease without esophagitis: Secondary | ICD-10-CM | POA: Diagnosis not present

## 2016-06-24 DIAGNOSIS — N183 Chronic kidney disease, stage 3 (moderate): Secondary | ICD-10-CM

## 2016-06-24 DIAGNOSIS — E1122 Type 2 diabetes mellitus with diabetic chronic kidney disease: Secondary | ICD-10-CM | POA: Insufficient documentation

## 2016-06-24 DIAGNOSIS — M109 Gout, unspecified: Secondary | ICD-10-CM | POA: Diagnosis not present

## 2016-06-24 DIAGNOSIS — N184 Chronic kidney disease, stage 4 (severe): Secondary | ICD-10-CM | POA: Insufficient documentation

## 2016-06-24 DIAGNOSIS — I130 Hypertensive heart and chronic kidney disease with heart failure and stage 1 through stage 4 chronic kidney disease, or unspecified chronic kidney disease: Secondary | ICD-10-CM | POA: Diagnosis present

## 2016-06-24 DIAGNOSIS — N189 Chronic kidney disease, unspecified: Secondary | ICD-10-CM

## 2016-06-24 DIAGNOSIS — I739 Peripheral vascular disease, unspecified: Secondary | ICD-10-CM | POA: Diagnosis not present

## 2016-06-24 DIAGNOSIS — I350 Nonrheumatic aortic (valve) stenosis: Secondary | ICD-10-CM | POA: Insufficient documentation

## 2016-06-24 DIAGNOSIS — M1990 Unspecified osteoarthritis, unspecified site: Secondary | ICD-10-CM | POA: Insufficient documentation

## 2016-06-24 DIAGNOSIS — Z87891 Personal history of nicotine dependence: Secondary | ICD-10-CM | POA: Diagnosis not present

## 2016-06-24 DIAGNOSIS — Z8541 Personal history of malignant neoplasm of cervix uteri: Secondary | ICD-10-CM | POA: Insufficient documentation

## 2016-06-24 DIAGNOSIS — Z79899 Other long term (current) drug therapy: Secondary | ICD-10-CM | POA: Diagnosis not present

## 2016-06-24 DIAGNOSIS — J449 Chronic obstructive pulmonary disease, unspecified: Secondary | ICD-10-CM | POA: Diagnosis not present

## 2016-06-24 DIAGNOSIS — I429 Cardiomyopathy, unspecified: Secondary | ICD-10-CM | POA: Insufficient documentation

## 2016-06-24 DIAGNOSIS — D500 Iron deficiency anemia secondary to blood loss (chronic): Secondary | ICD-10-CM

## 2016-06-24 LAB — CBC WITH DIFFERENTIAL/PLATELET
Basophils Absolute: 0.1 10*3/uL (ref 0–0.1)
Basophils Relative: 1 %
EOS ABS: 0 10*3/uL (ref 0–0.7)
EOS PCT: 1 %
HCT: 27 % — ABNORMAL LOW (ref 35.0–47.0)
Hemoglobin: 8.8 g/dL — ABNORMAL LOW (ref 12.0–16.0)
LYMPHS ABS: 0.5 10*3/uL — AB (ref 1.0–3.6)
LYMPHS PCT: 10 %
MCH: 29.6 pg (ref 26.0–34.0)
MCHC: 32.6 g/dL (ref 32.0–36.0)
MCV: 90.8 fL (ref 80.0–100.0)
MONO ABS: 0.4 10*3/uL (ref 0.2–0.9)
MONOS PCT: 9 %
Neutro Abs: 3.7 10*3/uL (ref 1.4–6.5)
Neutrophils Relative %: 79 %
PLATELETS: 123 10*3/uL — AB (ref 150–440)
RBC: 2.98 MIL/uL — ABNORMAL LOW (ref 3.80–5.20)
RDW: 16.4 % — AB (ref 11.5–14.5)
WBC: 4.8 10*3/uL (ref 3.6–11.0)

## 2016-06-24 LAB — IRON AND TIBC
Iron: 26 ug/dL — ABNORMAL LOW (ref 28–170)
SATURATION RATIOS: 7 % — AB (ref 10.4–31.8)
TIBC: 367 ug/dL (ref 250–450)
UIBC: 341 ug/dL

## 2016-06-24 LAB — COMPREHENSIVE METABOLIC PANEL
ALBUMIN: 3.3 g/dL — AB (ref 3.5–5.0)
ALK PHOS: 90 U/L (ref 38–126)
ALT: 21 U/L (ref 14–54)
ANION GAP: 7 (ref 5–15)
AST: 31 U/L (ref 15–41)
BILIRUBIN TOTAL: 0.4 mg/dL (ref 0.3–1.2)
BUN: 39 mg/dL — ABNORMAL HIGH (ref 6–20)
CALCIUM: 8.6 mg/dL — AB (ref 8.9–10.3)
CO2: 28 mmol/L (ref 22–32)
Chloride: 98 mmol/L — ABNORMAL LOW (ref 101–111)
Creatinine, Ser: 1.94 mg/dL — ABNORMAL HIGH (ref 0.44–1.00)
GFR calc Af Amer: 27 mL/min — ABNORMAL LOW (ref >60)
GFR, EST NON AFRICAN AMERICAN: 24 mL/min — AB (ref >60)
GLUCOSE: 210 mg/dL — AB (ref 65–99)
POTASSIUM: 5.2 mmol/L — AB (ref 3.5–5.1)
Sodium: 133 mmol/L — ABNORMAL LOW (ref 135–145)
TOTAL PROTEIN: 6.7 g/dL (ref 6.5–8.1)

## 2016-06-24 LAB — FERRITIN: Ferritin: 37 ng/mL (ref 11–307)

## 2016-06-24 LAB — SAMPLE TO BLOOD BANK

## 2016-06-24 MED ORDER — EPOETIN ALFA 20000 UNIT/ML IJ SOLN
20000.0000 [IU] | Freq: Once | INTRAMUSCULAR | Status: AC
Start: 2016-06-24 — End: 2016-06-24
  Administered 2016-06-24: 20000 [IU] via SUBCUTANEOUS
  Filled 2016-06-24: qty 1

## 2016-06-24 NOTE — Assessment & Plan Note (Addendum)
#   CHRONIC ANEMIA-chronic kidney disease/ secondary to GI bleed/AV malformations; iron deficiency. Continue procrit 20K weekly [? diarrhea]  # IV ferrahem if needed- iron studies awaiting today.   # Chronic kidney disease- creatinine-  ~2/ weight gain- follow up with PCP.   # diarrhea 2-4/day; recommend imoidum.   # Check H&H every week; Procrit weekly.; IV iron monthly. Follow-up with me in 3 months [facility] CMP

## 2016-06-24 NOTE — Progress Notes (Signed)
[place] OFFICE PROGRESS NOTE  Patient Care Team: Kirk Ruths, MD as PCP - General (Internal Medicine) Cammie Sickle, MD as Consulting Physician (Hematology and Oncology)   SUMMARY OF HEMATOLOGIC HISTORY:  # CHRONIC ANEMIA sec Hx GIB/CKD [ CKD STAGE III]; Jan-2016 [colo/EGD- Dr.Elliot]; capsule x2 - angiodysplasia; BMBx- 1999; IV iron & procrit;   # CHRONIC MILD THROMBOCYTOPENIA-   # CKD [creat 1.4-1.7]  # CHRONIC CELLULITIS/ AORTIC STENOSIS/ LIMITED MOBILITY   INTERVAL HISTORY:  81 year old female patient with multiple medical problems and above history of chronic anemia from chronic GI blood loss/chronic kidney disease on iron and Procrit is here for follow-up.  Patient reluctantly agreed to Procrit; she also received IV iron approximately month ago. Her hemoglobin has been staying above 9.   However she continues to attribute her diarrhea Procrit.   She complains of extreme fatigue. Denies any blood in stools. No nausea or vomiting. Swelling in legs are improved.   REVIEW OF SYSTEMS:  A complete 10 point review of system is done which is negative except mentioned above/history of present illness.   PAST MEDICAL HISTORY :  Past Medical History  Diagnosis Date  . Hypertension   . Cervical cancer ([place])     cervical cancer history  . Iron deficiency anemia 2008  . GI bleed   . History of blood transfusion   . Bilateral cellulitis of lower leg   . CAD (coronary artery disease)   . Fatty liver   . Hyperlipidemia   . GERD (gastroesophageal reflux disease)   . History of nicotine use   . Chronic edema   . Diabetes mellitus with stage 3 chronic kidney disease ([place])   . Trigeminal neuralgia   . Aortic stenosis   . Cardiomyopathy ([place])   . Gout   . Peptic ulcer disease   . Angiodysplasia   . Vascular malformation 06/14/1997  . Adenomatous polyps 03/05/2012  . COPD (chronic obstructive pulmonary disease) ([place])   . CHF (congestive heart  failure) ([place])   . Fibrocystic breast changes   . Irritable bowel syndrome   . Arthritis   . Carotid bruit   . Tobacco abuse   . Osteoarthritis   . Macrocytosis   . Abnormal LFTs (liver function tests)   . PVD (peripheral vascular disease) ([place])   . AV malformation of GI tract   . Clostridium difficile infection March 02, 2015    PAST SURGICAL HISTORY :   Past Surgical History  Procedure Laterality Date  . Abdominal hysterectomy    . Bone marrow biopsy  1999  . Angiogram cardiac stenting    . Thoracentesis    . Esophagogastroduodenoscopy  12/2014, 2014, 2013, 2009, 2004  . Colonoscopy  12/2014, 2014, 2013, 2008, 2004    03/05/2012-Adenomatous Polyps;09/23/2003-Polyp remains intact;   . Cholecystectomy    . Tonsillectomy    . Nasal sinus surgery    . Endoscopic carpal tunnel release    . Tee without cardioversion N/A 12/19/2015    Procedure: TRANSESOPHAGEAL ECHOCARDIOGRAM (TEE);  Surgeon: Yolonda Kida, MD;  Location: [facility] ORS;  Service: Cardiovascular;  Laterality: N/A;    FAMILY HISTORY :   Family History  Problem Relation Age of Onset  . Parkinsonism Mother   . Heart attack Mother   . Lung cancer Father   . Diabetes Mellitus II Father   . Asthma Sister   . Asthma Brother   . Heart disease Son     SOCIAL HISTORY:  Social History  Substance Use Topics  . Smoking status: Former Smoker -- 0.50 packs/day for 56 years    Types: Cigarettes    Quit date: 05/23/2013  . Smokeless tobacco: Never Used  . Alcohol Use: No    ALLERGIES:  is allergic to aspirin; atenolol; clopidogrel bisulfate; darvon; hydrochlorothiazide; iodinated diagnostic agents; nylon; other; and pravastatin.  MEDICATIONS:  Current Outpatient Prescriptions  Medication Sig Dispense Refill  . lisinopril (PRINIVIL,ZESTRIL) 40 MG tablet Take 40 mg by mouth daily.    . metolazone (ZAROXOLYN) 5 MG tablet Take 5 mg by mouth 2 (two) times a week.    . metoprolol succinate (TOPROL-XL) 50 MG 24 hr tablet  Take 1 tablet by mouth daily.    . pantoprazole (PROTONIX) 20 MG tablet Take 20 mg by mouth daily.    . pantoprazole (PROTONIX) 40 MG tablet Take 1 tablet by mouth daily. Reported on 12/19/2015    . potassium chloride (K-DUR) 10 MEQ tablet Take 10 mEq by mouth 3 (three) times a week.    . simvastatin (ZOCOR) 20 MG tablet Take 20 mg by mouth at bedtime.     . torsemide (DEMADEX) 20 MG tablet Take 1 tablet by mouth 3 (three) times daily.     No current facility-administered medications for this visit.    PHYSICAL EXAMINATION: ECOG PERFORMANCE STATUS: 2 - Symptomatic, <50% confined to bed  BP 90/55 mmHg  Pulse 73  Temp(Src) 97 F (36.1 C) (Tympanic)  Resp 18  Wt 188 lb (85.276 kg)  Filed Weights   06/24/16 1044  Weight: 188 lb (85.276 kg)    GENERAL: Well-nourished well-developed; Alert, no distress and comfortable.   She is accompanied by her brother. She is in a wheelchair. She cannot sit on the exam table.  EYES: Positive for pallor. OROPHARYNX: no thrush or ulceration; good dentition  NECK: supple, no masses felt LYMPH:  no palpable lymphadenopathy in the cervical,  LUNGS: clear to auscultation and  No wheeze or crackles HEART/CVS: regular rate & rhythm and positive for systolic murmurs; bilateral lower extremity swelling ; left larger than the right /chronic venous stasis changes noted.  ABDOMEN:abdomen soft, non-tender and normal bowel sounds Musculoskeletal:no cyanosis of digits and no clubbing  PSYCH: alert & oriented x 3 with fluent speech NEURO: no focal motor/sensory deficits SKIN:  erythema of the left lower extremity /improving.   LABORATORY DATA:  I have reviewed the data as listed    Component Value Date/Time   NA 133* 06/24/2016 1001   NA 139 01/18/2015 0545   K 5.2* 06/24/2016 1001   K 3.9 01/18/2015 0545   CL 98* 06/24/2016 1001   CL 105 01/18/2015 0545   CO2 28 06/24/2016 1001   CO2 25 01/18/2015 0545   GLUCOSE 210* 06/24/2016 1001   GLUCOSE 179*  01/18/2015 0545   BUN 39* 06/24/2016 1001   BUN 29* 01/18/2015 0545   CREATININE 1.94* 06/24/2016 1001   CREATININE 1.80* 01/18/2015 0545   CALCIUM 8.6* 06/24/2016 1001   CALCIUM 8.7 01/18/2015 0545   PROT 6.7 06/24/2016 1001   PROT 6.6 06/20/2014 1101   ALBUMIN 3.3* 06/24/2016 1001   ALBUMIN 3.1* 06/20/2014 1101   AST 31 06/24/2016 1001   AST 32 06/20/2014 1101   ALT 21 06/24/2016 1001   ALT 31 06/20/2014 1101   ALKPHOS 90 06/24/2016 1001   ALKPHOS 125* 06/20/2014 1101   BILITOT 0.4 06/24/2016 1001   BILITOT 0.3 06/20/2014 1101   GFRNONAA 24* 06/24/2016 1001  GFRNONAA 29* 01/18/2015 0545   GFRNONAA 28* 06/20/2014 1101   GFRAA 27* 06/24/2016 1001   GFRAA 35* 01/18/2015 0545   GFRAA 33* 06/20/2014 1101    No results found for: SPEP, UPEP  Lab Results  Component Value Date   WBC 4.8 06/24/2016   NEUTROABS 3.7 06/24/2016   HGB 8.8* 06/24/2016   HCT 27.0* 06/24/2016   MCV 90.8 06/24/2016   PLT 123* 06/24/2016      Chemistry      Component Value Date/Time   NA 133* 06/24/2016 1001   NA 139 01/18/2015 0545   K 5.2* 06/24/2016 1001   K 3.9 01/18/2015 0545   CL 98* 06/24/2016 1001   CL 105 01/18/2015 0545   CO2 28 06/24/2016 1001   CO2 25 01/18/2015 0545   BUN 39* 06/24/2016 1001   BUN 29* 01/18/2015 0545   CREATININE 1.94* 06/24/2016 1001   CREATININE 1.80* 01/18/2015 0545      Component Value Date/Time   CALCIUM 8.6* 06/24/2016 1001   CALCIUM 8.7 01/18/2015 0545   ALKPHOS 90 06/24/2016 1001   ALKPHOS 125* 06/20/2014 1101   AST 31 06/24/2016 1001   AST 32 06/20/2014 1101   ALT 21 06/24/2016 1001   ALT 31 06/20/2014 1101   BILITOT 0.4 06/24/2016 1001   BILITOT 0.3 06/20/2014 1101        ASSESSMENT & PLAN:   Absolute anemia  iron studies.   # 25 minutes face-to-face with the patient discussing the above plan of care; more than 50% of time spent on prognosis/ natural history; counseling and coordination.  Anemia of chronic kidney failure # CHRONIC  ANEMIA- secondary to GI bleed/AV malformations; iron deficiency/ chronic kidney disease. Patient has been off Aranesp/Procrit for the last many months- secondary to intolerance. Today hemoglobin is 7.9; ferritin 1200. Long discussion regarding re-starting procrit- Patient finally agreeable.  # Also given the symptomatic anemia recommend 1 unit of PRBC tomorrow.  # Chronic kidney disease- creatinine-  ~2.   # Check H&H every week; Procrit weekly.; IV iron monthly. Follow-up with me in 6 weeks/CBC CMP      Cammie Sickle, MD 06/24/2016 10:51 AM

## 2016-06-24 NOTE — Assessment & Plan Note (Addendum)
iron studies.   # 25 minutes face-to-face with the patient discussing the above plan of care; more than 50% of time spent on prognosis/ natural history; counseling and coordination.

## 2016-07-01 ENCOUNTER — Inpatient Hospital Stay: Payer: Medicare Other

## 2016-07-01 ENCOUNTER — Other Ambulatory Visit: Payer: Self-pay | Admitting: Internal Medicine

## 2016-07-01 VITALS — BP 123/81 | HR 64 | Temp 97.2°F | Resp 16

## 2016-07-01 DIAGNOSIS — N183 Chronic kidney disease, stage 3 unspecified: Secondary | ICD-10-CM

## 2016-07-01 DIAGNOSIS — D500 Iron deficiency anemia secondary to blood loss (chronic): Secondary | ICD-10-CM

## 2016-07-01 DIAGNOSIS — D631 Anemia in chronic kidney disease: Secondary | ICD-10-CM

## 2016-07-01 DIAGNOSIS — N189 Chronic kidney disease, unspecified: Principal | ICD-10-CM

## 2016-07-01 DIAGNOSIS — I130 Hypertensive heart and chronic kidney disease with heart failure and stage 1 through stage 4 chronic kidney disease, or unspecified chronic kidney disease: Secondary | ICD-10-CM | POA: Diagnosis not present

## 2016-07-01 LAB — HEMOGLOBIN: Hemoglobin: 7.9 g/dL — ABNORMAL LOW (ref 12.0–16.0)

## 2016-07-01 LAB — SAMPLE TO BLOOD BANK

## 2016-07-01 LAB — HEMATOCRIT: HCT: 24.2 % — ABNORMAL LOW (ref 35.0–47.0)

## 2016-07-01 MED ORDER — EPOETIN ALFA 20000 UNIT/ML IJ SOLN
20000.0000 [IU] | Freq: Once | INTRAMUSCULAR | Status: AC
  Administered 2016-07-01: 20000 [IU] via SUBCUTANEOUS
  Filled 2016-07-01: qty 1

## 2016-07-04 ENCOUNTER — Inpatient Hospital Stay: Payer: Medicare Other

## 2016-07-04 DIAGNOSIS — D500 Iron deficiency anemia secondary to blood loss (chronic): Secondary | ICD-10-CM

## 2016-07-04 DIAGNOSIS — I130 Hypertensive heart and chronic kidney disease with heart failure and stage 1 through stage 4 chronic kidney disease, or unspecified chronic kidney disease: Secondary | ICD-10-CM | POA: Diagnosis not present

## 2016-07-04 MED ORDER — SODIUM CHLORIDE 0.9 % IV SOLN
510.0000 mg | Freq: Once | INTRAVENOUS | Status: AC
  Administered 2016-07-04: 510 mg via INTRAVENOUS
  Filled 2016-07-04: qty 17

## 2016-07-04 MED ORDER — HEPARIN SOD (PORK) LOCK FLUSH 100 UNIT/ML IV SOLN
500.0000 [IU] | Freq: Once | INTRAVENOUS | Status: AC
  Administered 2016-07-04: 500 [IU] via INTRAVENOUS

## 2016-07-04 MED ORDER — HEPARIN SOD (PORK) LOCK FLUSH 100 UNIT/ML IV SOLN
INTRAVENOUS | Status: AC
  Filled 2016-07-04: qty 5

## 2016-07-04 MED ORDER — SODIUM CHLORIDE 0.9 % IV SOLN
Freq: Once | INTRAVENOUS | Status: AC
  Administered 2016-07-04: 15:00:00 via INTRAVENOUS
  Filled 2016-07-04: qty 1000

## 2016-07-08 ENCOUNTER — Inpatient Hospital Stay: Payer: Medicare Other

## 2016-07-08 ENCOUNTER — Other Ambulatory Visit: Payer: Self-pay | Admitting: Internal Medicine

## 2016-07-08 ENCOUNTER — Telehealth: Payer: Self-pay

## 2016-07-08 VITALS — BP 119/75 | HR 67 | Resp 18

## 2016-07-08 DIAGNOSIS — D631 Anemia in chronic kidney disease: Secondary | ICD-10-CM

## 2016-07-08 DIAGNOSIS — N183 Chronic kidney disease, stage 3 unspecified: Secondary | ICD-10-CM

## 2016-07-08 DIAGNOSIS — D500 Iron deficiency anemia secondary to blood loss (chronic): Secondary | ICD-10-CM

## 2016-07-08 DIAGNOSIS — I130 Hypertensive heart and chronic kidney disease with heart failure and stage 1 through stage 4 chronic kidney disease, or unspecified chronic kidney disease: Secondary | ICD-10-CM | POA: Diagnosis not present

## 2016-07-08 DIAGNOSIS — N189 Chronic kidney disease, unspecified: Principal | ICD-10-CM

## 2016-07-08 LAB — SAMPLE TO BLOOD BANK

## 2016-07-08 LAB — HEMOGLOBIN: HEMOGLOBIN: 7.5 g/dL — AB (ref 12.0–16.0)

## 2016-07-08 LAB — HEMATOCRIT: HEMATOCRIT: 22.5 % — AB (ref 35.0–47.0)

## 2016-07-08 MED ORDER — EPOETIN ALFA 20000 UNIT/ML IJ SOLN
20000.0000 [IU] | Freq: Once | INTRAMUSCULAR | Status: AC
  Administered 2016-07-08: 20000 [IU] via SUBCUTANEOUS
  Filled 2016-07-08: qty 1

## 2016-07-08 NOTE — Telephone Encounter (Signed)
Patient is here for Procrit injection and she has concerns of chest tightness with a choking sensation with any activity also has increase in weakness.  She does state that this is her usual symptoms when she is in need of a blood transfusion.  Patient did receive Procrit injection today and would like for Dr. Aletha Halim nurse to give her a call regarding any changes in plan.

## 2016-07-08 NOTE — Telephone Encounter (Signed)
msg sent for patient to be Scheduled for IV fereheme in 1 week on Tuesday July 7/25. RN spoke with patient and made her aware that md is recommending an additional IV iron treatment. Teach back process performed.

## 2016-07-09 ENCOUNTER — Other Ambulatory Visit: Payer: Self-pay

## 2016-07-09 ENCOUNTER — Telehealth: Payer: Self-pay | Admitting: *Deleted

## 2016-07-09 ENCOUNTER — Encounter (HOSPITAL_COMMUNITY): Payer: Self-pay | Admitting: Family Medicine

## 2016-07-09 ENCOUNTER — Emergency Department
Admission: EM | Admit: 2016-07-09 | Discharge: 2016-07-09 | Disposition: A | Discharge status: Other Acute Inpatient Hospital | Payer: Medicare Other | Attending: Emergency Medicine | Admitting: Emergency Medicine

## 2016-07-09 ENCOUNTER — Inpatient Hospital Stay (HOSPITAL_COMMUNITY)
Admission: AD | Admit: 2016-07-09 | Discharge: 2016-07-13 | DRG: 378 | Disposition: A | Discharge status: Home / Self Care | Payer: Medicare Other | Source: Other Acute Inpatient Hospital | Attending: Internal Medicine | Admitting: Internal Medicine

## 2016-07-09 DIAGNOSIS — Z91048 Other nonmedicinal substance allergy status: Secondary | ICD-10-CM | POA: Diagnosis not present

## 2016-07-09 DIAGNOSIS — E861 Hypovolemia: Secondary | ICD-10-CM | POA: Diagnosis present

## 2016-07-09 DIAGNOSIS — N183 Chronic kidney disease, stage 3 unspecified: Secondary | ICD-10-CM | POA: Diagnosis present

## 2016-07-09 DIAGNOSIS — E871 Hypo-osmolality and hyponatremia: Secondary | ICD-10-CM

## 2016-07-09 DIAGNOSIS — Z87891 Personal history of nicotine dependence: Secondary | ICD-10-CM | POA: Insufficient documentation

## 2016-07-09 DIAGNOSIS — E1122 Type 2 diabetes mellitus with diabetic chronic kidney disease: Secondary | ICD-10-CM | POA: Diagnosis not present

## 2016-07-09 DIAGNOSIS — I2510 Atherosclerotic heart disease of native coronary artery without angina pectoris: Secondary | ICD-10-CM | POA: Insufficient documentation

## 2016-07-09 DIAGNOSIS — L8990 Pressure ulcer of unspecified site, unspecified stage: Secondary | ICD-10-CM | POA: Diagnosis present

## 2016-07-09 DIAGNOSIS — D122 Benign neoplasm of ascending colon: Secondary | ICD-10-CM | POA: Diagnosis present

## 2016-07-09 DIAGNOSIS — N179 Acute kidney failure, unspecified: Secondary | ICD-10-CM

## 2016-07-09 DIAGNOSIS — Z86718 Personal history of other venous thrombosis and embolism: Secondary | ICD-10-CM | POA: Insufficient documentation

## 2016-07-09 DIAGNOSIS — J449 Chronic obstructive pulmonary disease, unspecified: Secondary | ICD-10-CM | POA: Insufficient documentation

## 2016-07-09 DIAGNOSIS — Z8541 Personal history of malignant neoplasm of cervix uteri: Secondary | ICD-10-CM | POA: Diagnosis not present

## 2016-07-09 DIAGNOSIS — I10 Essential (primary) hypertension: Secondary | ICD-10-CM

## 2016-07-09 DIAGNOSIS — Z791 Long term (current) use of non-steroidal anti-inflammatories (NSAID): Secondary | ICD-10-CM | POA: Diagnosis not present

## 2016-07-09 DIAGNOSIS — D696 Thrombocytopenia, unspecified: Secondary | ICD-10-CM

## 2016-07-09 DIAGNOSIS — M1990 Unspecified osteoarthritis, unspecified site: Secondary | ICD-10-CM | POA: Insufficient documentation

## 2016-07-09 DIAGNOSIS — Z885 Allergy status to narcotic agent status: Secondary | ICD-10-CM

## 2016-07-09 DIAGNOSIS — Q2733 Arteriovenous malformation of digestive system vessel: Secondary | ICD-10-CM

## 2016-07-09 DIAGNOSIS — K5520 Angiodysplasia of colon without hemorrhage: Secondary | ICD-10-CM

## 2016-07-09 DIAGNOSIS — E860 Dehydration: Secondary | ICD-10-CM | POA: Diagnosis present

## 2016-07-09 DIAGNOSIS — D62 Acute posthemorrhagic anemia: Secondary | ICD-10-CM | POA: Diagnosis present

## 2016-07-09 DIAGNOSIS — K921 Melena: Secondary | ICD-10-CM | POA: Diagnosis present

## 2016-07-09 DIAGNOSIS — I429 Cardiomyopathy, unspecified: Secondary | ICD-10-CM | POA: Insufficient documentation

## 2016-07-09 DIAGNOSIS — Z79899 Other long term (current) drug therapy: Secondary | ICD-10-CM | POA: Insufficient documentation

## 2016-07-09 DIAGNOSIS — D649 Anemia, unspecified: Secondary | ICD-10-CM

## 2016-07-09 DIAGNOSIS — I509 Heart failure, unspecified: Secondary | ICD-10-CM | POA: Insufficient documentation

## 2016-07-09 DIAGNOSIS — D500 Iron deficiency anemia secondary to blood loss (chronic): Secondary | ICD-10-CM | POA: Diagnosis present

## 2016-07-09 DIAGNOSIS — Z90710 Acquired absence of both cervix and uterus: Secondary | ICD-10-CM

## 2016-07-09 DIAGNOSIS — Z888 Allergy status to other drugs, medicaments and biological substances status: Secondary | ICD-10-CM | POA: Diagnosis not present

## 2016-07-09 DIAGNOSIS — I130 Hypertensive heart and chronic kidney disease with heart failure and stage 1 through stage 4 chronic kidney disease, or unspecified chronic kidney disease: Secondary | ICD-10-CM | POA: Insufficient documentation

## 2016-07-09 DIAGNOSIS — K922 Gastrointestinal hemorrhage, unspecified: Secondary | ICD-10-CM

## 2016-07-09 DIAGNOSIS — Z886 Allergy status to analgesic agent status: Secondary | ICD-10-CM

## 2016-07-09 DIAGNOSIS — E1149 Type 2 diabetes mellitus with other diabetic neurological complication: Secondary | ICD-10-CM

## 2016-07-09 DIAGNOSIS — R9431 Abnormal electrocardiogram [ECG] [EKG]: Secondary | ICD-10-CM

## 2016-07-09 DIAGNOSIS — E785 Hyperlipidemia, unspecified: Secondary | ICD-10-CM | POA: Diagnosis not present

## 2016-07-09 DIAGNOSIS — K5521 Angiodysplasia of colon with hemorrhage: Principal | ICD-10-CM | POA: Diagnosis present

## 2016-07-09 DIAGNOSIS — Z91041 Radiographic dye allergy status: Secondary | ICD-10-CM

## 2016-07-09 DIAGNOSIS — K625 Hemorrhage of anus and rectum: Secondary | ICD-10-CM | POA: Diagnosis present

## 2016-07-09 HISTORY — DX: Adverse effect of unspecified anesthetic, initial encounter: T41.45XA

## 2016-07-09 HISTORY — DX: Other complications of anesthesia, initial encounter: T88.59XA

## 2016-07-09 LAB — CBC
HEMATOCRIT: 22.9 % — AB (ref 35.0–47.0)
Hemoglobin: 7.4 g/dL — ABNORMAL LOW (ref 12.0–16.0)
MCH: 28.8 pg (ref 26.0–34.0)
MCHC: 32.5 g/dL (ref 32.0–36.0)
MCV: 88.5 fL (ref 80.0–100.0)
PLATELETS: 133 10*3/uL — AB (ref 150–440)
RBC: 2.58 MIL/uL — AB (ref 3.80–5.20)
RDW: 17.2 % — AB (ref 11.5–14.5)
WBC: 5.4 10*3/uL (ref 3.6–11.0)

## 2016-07-09 LAB — COMPREHENSIVE METABOLIC PANEL
ALT: 17 U/L (ref 14–54)
AST: 24 U/L (ref 15–41)
Albumin: 3.4 g/dL — ABNORMAL LOW (ref 3.5–5.0)
Alkaline Phosphatase: 79 U/L (ref 38–126)
Anion gap: 11 (ref 5–15)
BILIRUBIN TOTAL: 1 mg/dL (ref 0.3–1.2)
BUN: 40 mg/dL — AB (ref 6–20)
CO2: 24 mmol/L (ref 22–32)
Calcium: 8.6 mg/dL — ABNORMAL LOW (ref 8.9–10.3)
Chloride: 98 mmol/L — ABNORMAL LOW (ref 101–111)
Creatinine, Ser: 2.34 mg/dL — ABNORMAL HIGH (ref 0.44–1.00)
GFR, EST AFRICAN AMERICAN: 22 mL/min — AB (ref >60)
GFR, EST NON AFRICAN AMERICAN: 19 mL/min — AB (ref >60)
Glucose, Bld: 205 mg/dL — ABNORMAL HIGH (ref 65–99)
POTASSIUM: 4.6 mmol/L (ref 3.5–5.1)
Sodium: 133 mmol/L — ABNORMAL LOW (ref 135–145)
TOTAL PROTEIN: 6.5 g/dL (ref 6.5–8.1)

## 2016-07-09 LAB — TROPONIN I: Troponin I: <0.03 ng/mL (ref <0.03)

## 2016-07-09 MED ORDER — ACETAMINOPHEN 650 MG RE SUPP: 650.0000 mg | Freq: Four times a day (QID) | RECTAL | Status: DC | PRN

## 2016-07-09 MED ORDER — SODIUM CHLORIDE 0.9 % IV SOLN
Freq: Once | INTRAVENOUS | Status: AC
  Administered 2016-07-10: 01:00:00 via INTRAVENOUS

## 2016-07-09 MED ORDER — SODIUM CHLORIDE 0.9% FLUSH
3.0000 mL | Freq: Two times a day (BID) | INTRAVENOUS | Status: DC
  Administered 2016-07-10 – 2016-07-13 (×4): 3 mL via INTRAVENOUS

## 2016-07-09 MED ORDER — INSULIN ASPART 100 UNIT/ML ~~LOC~~ SOLN: 0.0000 [IU] | Freq: Three times a day (TID) | SUBCUTANEOUS | Status: DC

## 2016-07-09 MED ORDER — HYDROCODONE-ACETAMINOPHEN 5-325 MG PO TABS: 1.0000 | ORAL_TABLET | ORAL | Status: DC | PRN

## 2016-07-09 MED ORDER — SODIUM CHLORIDE 0.9 % IV SOLN: 10.0000 mL/h | Freq: Once | INTRAVENOUS | Status: DC

## 2016-07-09 MED ORDER — HYDRALAZINE HCL 20 MG/ML IJ SOLN: 10.0000 mg | INTRAMUSCULAR | Status: DC | PRN

## 2016-07-09 MED ORDER — ACETAMINOPHEN 500 MG PO TABS
500.0000 mg | ORAL_TABLET | Freq: Every day | ORAL | Status: DC
  Administered 2016-07-10 – 2016-07-12 (×3): 500 mg via ORAL
  Filled 2016-07-09 (×3): qty 1

## 2016-07-09 MED ORDER — ONDANSETRON HCL 4 MG/2ML IJ SOLN: 4.0000 mg | Freq: Four times a day (QID) | INTRAMUSCULAR | Status: DC | PRN

## 2016-07-09 MED ORDER — ACETAMINOPHEN 325 MG PO TABS: 650.0000 mg | ORAL_TABLET | Freq: Four times a day (QID) | ORAL | Status: DC | PRN

## 2016-07-09 MED ORDER — SODIUM CHLORIDE 0.9 % IV SOLN
INTRAVENOUS | Status: AC
  Administered 2016-07-10: 05:00:00 via INTRAVENOUS

## 2016-07-09 MED ORDER — ONDANSETRON HCL 4 MG PO TABS: 4.0000 mg | ORAL_TABLET | Freq: Four times a day (QID) | ORAL | Status: DC | PRN

## 2016-07-09 MED ORDER — METOPROLOL SUCCINATE ER 50 MG PO TB24
50.0000 mg | ORAL_TABLET | Freq: Every day | ORAL | Status: DC
  Administered 2016-07-10 – 2016-07-13 (×4): 50 mg via ORAL
  Filled 2016-07-09 (×4): qty 1

## 2016-07-09 MED ORDER — ACETAMINOPHEN 500 MG PO TABS
1000.0000 mg | ORAL_TABLET | Freq: Once | ORAL | Status: AC
  Administered 2016-07-10: 1000 mg via ORAL
  Filled 2016-07-09: qty 2

## 2016-07-09 MED ORDER — SIMVASTATIN 20 MG PO TABS
20.0000 mg | ORAL_TABLET | Freq: Every day | ORAL | Status: DC
  Administered 2016-07-10 – 2016-07-12 (×4): 20 mg via ORAL
  Filled 2016-07-09 (×4): qty 1

## 2016-07-09 NOTE — ED Provider Notes (Signed)
[facility] Emergency Department Provider Note  ____________________________________________  Time seen: Approximately 1:39 PM  I have reviewed the triage vital signs and the nursing notes.   HISTORY  Chief Complaint Rectal Bleeding    HPI Morgan [name] is a 81 y.o. 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 The patient reports that she had 2 bloody bowel movements last night with some most associated lower abdominal cramping. In addition she has been having some exertional dyspnea which is worse than her baseline. For several weeks, the patient has had no exertional substernal chest pressure which radiates to her neck with exertion which resolves with rest. The patient has also had postural lightheadedness without syncope. No fever, nausea or vomiting.   Past Medical History  Diagnosis Date  . Hypertension   . Cervical cancer ([place])     cervical cancer history  . Iron deficiency anemia 2008  . GI bleed   . History of blood transfusion   . Bilateral cellulitis of lower leg   . CAD (coronary artery disease)   . Fatty liver   . Hyperlipidemia   . GERD (gastroesophageal reflux disease)   . History of nicotine use   . Chronic edema   . Diabetes mellitus with stage 3 chronic kidney disease ([place])   . Trigeminal neuralgia   . Aortic stenosis   . Cardiomyopathy ([place])   . Gout   . Peptic ulcer disease   . Angiodysplasia   . Vascular malformation 06/14/1997  . Adenomatous polyps 03/05/2012  . COPD (chronic obstructive pulmonary disease) ([place])   . CHF (congestive heart failure) ([place])   . Fibrocystic breast changes   . Irritable bowel syndrome   . Arthritis   . Carotid bruit   . Tobacco abuse   . Osteoarthritis   . Macrocytosis   . Abnormal LFTs (liver function tests)   . PVD  (peripheral vascular disease) ([place])   . AV malformation of GI tract   . Clostridium difficile infection March 02, 2015    Patient Active Problem List   Diagnosis Date Noted  . Anemia of chronic kidney failure 06/24/2016  . Iron deficiency anemia due to chronic blood loss 04/29/2016  . Angiodysplasia 05/19/2015  . CA cervix ([place]) 05/19/2015  . Fatty infiltration of liver 05/19/2015  . Increased MCV 05/19/2015  . Gastroduodenal ulcer 05/19/2015  . Hypercholesterolemia without hypertriglyceridemia 03/06/2015  . Accumulation of fluid in tissues 12/27/2014  . Diabetes ([place]) 10/25/2014  . Absolute anemia 06/18/2014  . Aortic heart valve narrowing 06/18/2014  . 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 Arteriosclerosis of coronary artery 06/18/2014  . Cardiomyopathy ([place]) 06/18/2014  . Chronic kidney disease (CKD), stage III (moderate) 06/18/2014  . CAFL (chronic airflow limitation) ([place]) 06/18/2014  . Type II diabetes mellitus with neurological manifestations ([place]) 06/18/2014  . Gout 06/18/2014  . Benign hypertension 06/18/2014  . Fothergill's neuralgia 06/18/2014    Past Surgical History  Procedure Laterality Date  . Abdominal hysterectomy    . Bone marrow biopsy  1999  . Angiogram cardiac stenting    . Thoracentesis    . Esophagogastroduodenoscopy  12/2014, 2014, 2013, 2009, 2004  . Colonoscopy  12/2014, 2014, 2013, 2008, 2004    03/05/2012-Adenomatous Polyps;09/23/2003-Polyp remains intact;   . Cholecystectomy    . Tonsillectomy    . Nasal sinus surgery    . Endoscopic  carpal tunnel release    . Tee without cardioversion N/A 12/19/2015    Procedure: TRANSESOPHAGEAL ECHOCARDIOGRAM (TEE);  Surgeon: Yolonda Kida, MD;  Location: [facility] ORS;  Service: Cardiovascular;  Laterality: N/A;    Current Outpatient Rx  Name  Route  Sig  Dispense  Refill  . acetaminophen (TYLENOL) 500 MG tablet   Oral   Take 500 mg by mouth at bedtime.         Marland Kitchen lisinopril (PRINIVIL,ZESTRIL) 40 MG  tablet   Oral   Take 40 mg by mouth daily.         . metoprolol succinate (TOPROL-XL) 50 MG 24 hr tablet   Oral   Take 50 mg by mouth daily.          . pantoprazole (PROTONIX) 40 MG tablet   Oral   Take 40 mg by mouth daily.         . simvastatin (ZOCOR) 20 MG tablet   Oral   Take 20 mg by mouth at bedtime.          . torsemide (DEMADEX) 20 MG tablet   Oral   Take 1 tablet by mouth 4 (four) times daily.            Allergies Aspirin; Atenolol; Clopidogrel bisulfate; Darvon; Hydrochlorothiazide; Iodinated diagnostic agents; Nylon; Other; and Pravastatin  Family History  Problem Relation Age of Onset  . Parkinsonism Mother   . Heart attack Mother   . Lung cancer Father   . Diabetes Mellitus II Father   . Asthma Sister   . Asthma Brother   . Heart disease Son     Social History Social History  Substance Use Topics  . Smoking status: Former Smoker -- 0.50 packs/day for 56 years    Types: Cigarettes    Quit date: 05/23/2013  . Smokeless tobacco: Never Used  . Alcohol Use: No    Review of Systems Constitutional: No fever/chills. Positive postural lightheadedness. Negative syncope. Eyes: No visual changes. ENT: No sore throat. No congestion or rhinorrhea. Cardiovascular: Positive chest pain. Denies palpitations. Respiratory: Positive worsening exertional shortness of breath.  No cough. Gastrointestinal: Positive lower abdominal pain.  No nausea, no vomiting.  No diarrhea.  No constipation. Positive bright red blood per rectum. Genitourinary: Negative for dysuria. Musculoskeletal: Negative for back pain. Skin: Negative for rash. Neurological: Negative for headaches. No focal numbness, tingling or weakness.   10-point ROS otherwise negative.  ____________________________________________   PHYSICAL EXAM:  VITAL SIGNS: ED Triage Vitals  Enc Vitals Group     BP 07/09/16 1047 105/57 mmHg     Pulse Rate 07/09/16 1047 96     Resp 07/09/16 1211 18      Temp 07/09/16 1047 98 F (36.7 C)     Temp Source 07/09/16 1047 Oral     SpO2 07/09/16 1047 98 %     Weight 07/09/16 1047 190 lb (86.183 kg)     Height 07/09/16 1047 5' 3"  (1.6 m)     Head Cir --      Peak Flow --      Pain Score --      Pain Loc --      Pain Edu? --      Excl. in [place]? --     Constitutional: Alert and oriented. Well appearing and in no acute distress. Answers questions appropriately. Eyes: Conjunctivae are normal.  EOMI. No scleral icterus. Head: Atraumatic. Nose: No congestion/rhinnorhea. Mouth/Throat: Mucous membranes are moist.  Neck: No stridor.  Supple.  No meningismus. Cardiovascular: Normal rate, regular rhythm. No murmurs, rubs or gallops.  Respiratory: Normal respiratory effort.  No accessory muscle use or retractions. Lungs CTAB.  No wheezes, rales or ronchi. Gastrointestinal: Soft, nontender and mildly distended.  No guarding or rebound.  No peritoneal signs. Genitourinary: No visible external or palpable internal hemorrhoids. No pain with rectal examination. Musculoskeletal: No LE edema. No ttp in the calves or palpable cords.  Negative Homan's sign. Neurologic:  A&Ox3.  Speech is clear.  Face and smile are symmetric.  EOMI.  Moves all extremities well. Skin:  Skin is warm, dry and intact. No rash noted. Psychiatric: Mood and affect are normal. Speech and behavior are normal.  Normal judgement.  ____________________________________________   LABS (all labs ordered are listed, but only abnormal results are displayed)  Labs Reviewed  COMPREHENSIVE METABOLIC PANEL - Abnormal; Notable for the following:    Sodium 133 (*)    Chloride 98 (*)    Glucose, Bld 205 (*)    BUN 40 (*)    Creatinine, Ser 2.34 (*)    Calcium 8.6 (*)    Albumin 3.4 (*)    GFR calc non Af Amer 19 (*)    GFR calc Af Amer 22 (*)    All other components within normal limits  CBC - Abnormal; Notable for the following:    RBC 2.58 (*)    Hemoglobin 7.4 (*)    HCT 22.9 (*)     RDW 17.2 (*)    Platelets 133 (*)    All other components within normal limits  TROPONIN I  POC OCCULT BLOOD, ED  TYPE AND SCREEN  PREPARE RBC (CROSSMATCH)   ____________________________________________  EKG  ED ECG REPORT I, Eula Listen, the attending physician, personally viewed and interpreted this ECG.   Date: 07/09/2016  EKG Time: 1321  Rate: 113  Rhythm: sinus tachycardia  Axis: Normal  Intervals:none  ST&T Change: No ST elevation. 1 mm ST depression in V4, V5 and V6 as well as II and III.  ____________________________________________  RADIOLOGY  No results found.  ____________________________________________   PROCEDURES  Procedure(s) performed: None  Critical Care performed: yes ____________________________________________   INITIAL IMPRESSION / ASSESSMENT AND PLAN / ED COURSE  Pertinent labs & imaging results that were available during my care of the patient were reviewed by me and considered in my medical decision making (see chart for details).  81 y.o. female with known AV malformations of the GI tract presenting with recurrent GI bleed, sinus tachycardia, and symptomatic exertional chest pain and shortness of breath. The patient has laboratory studies from triage which show a hemoglobin of 7.4, as well as a acute on chronic renal insufficiency. Given that she has congestive heart failure and a depressed ejection fraction, will treat her immediately with blood rather than treating her tachycardia with fluid initially. The patient will require admission to the hospital. Unfortunate, we do not have GI coverage today, so the [facility] transfer center has been called.  The patient's exertional chest pain, shortness of breath does appear to be a more chronic condition, but she does have some ST depression on her EKG which is concerning for likely stress-induced ischemia. I do not think that the acute problem today is ACS or acute MI, but we will also  get a troponin to further try and the symptoms in the setting of this abnormal EKG.  CRITICAL CARE Performed by: Mariea Clonts, [place]   Total critical care time: 35 minutes  Critical care time was exclusive of separately billable procedures and treating other patients.  Critical care was necessary to treat or prevent imminent or life-threatening deterioration.  Critical care was time spent personally by me on the following activities: development of treatment plan with patient and/or surrogate as well as nursing, discussions with consultants, evaluation of patient's response to treatment, examination of patient, obtaining history from patient or surrogate, ordering and performing treatments and interventions, ordering and review of laboratory studies, ordering and review of radiographic studies, pulse oximetry and re-evaluation of patient's condition.  ----------------------------------------- 2:18 PM on 07/09/2016 -----------------------------------------  The patient has been accepted to [facility] for transfer at this time. The patient's troponin is still pending. She is receiving her first unit of blood at this time.   ____________________________________________  FINAL CLINICAL IMPRESSION(S) / ED DIAGNOSES  Final diagnoses:  Gastrointestinal hemorrhage, unspecified gastritis, unspecified gastrointestinal hemorrhage type  Symptomatic anemia  ST segment depression      NEW MEDICATIONS STARTED DURING THIS VISIT:  New Prescriptions   No medications on file     Eula Listen, MD 07/09/16 1421

## 2016-07-09 NOTE — ED Notes (Signed)
Pt cleaned and bed sheets changed

## 2016-07-09 NOTE — ED Notes (Signed)
Pt resting in bed, family at bedside, awaiting bed assignment at [facility], will continue to monitor resp status while receiving blood

## 2016-07-09 NOTE — H&P (Signed)
History and Physical    Morgan [name] DOB: 1935-07-31 DOA: 07/09/2016  PCP: Kirk Ruths., MD   Patient coming from: Home   Chief Complaint: Bloody bowel movements   HPI: Morgan [name] is a 81 y.o. female with medical history significant for recurrent GI bleeds attributed to angiodysplasia, chronic iron deficiency anemia managed with Procrit and iron infusions, hypertension, type 2 diabetes mellitus, and hyperlipidemia who presented to [facility] for evaluation of bloody stools. Patient reports pain in her usual state of health until the insidious development of dyspnea and generalized weakness several days ago which patient reports is a common occurrence for her when her hemoglobin has dropped. She is followed by outpatient oncology for Procrit and iron infusions and received Procrit on 07/08/2016, and last iron infusion on 07/04/2016. Hemoglobin was noted to be 7.5 at that time, down from 8.8 earlier in the month. Patient was woken up from her sleep this morning at approximately 4 AM with sudden need to move her bowels. She reports the bowel movement as liquid, thinking it was watery into she noted bright red blood in the toilet. There was no associated abdominal pain or nausea with this. She's had no epigastric discomfort or vomiting. She had a second bloody bowel movement, this one formed, later in the day and called her oncologist clinic for recommendations. She was advised to seek further care in the emergency department.  [place] ED Course: Upon arrival to the [facility] ED, patient is found to be afebrile, saturating adequately on 2 L/m, mildly tachypneic and tachycardic, and with stable blood pressure. EKG demonstrates a sinus tachycardia with rate 113 and lateral ST depressions. Troponin is undetectable. Chemistry panels notable for mild hyponatremia, hyperglycemia, and serum creatinine of 2.34, up from 1.9 earlier this month. CBC features a hemoglobin of  7.4 with normal MCV, and thrombocytopenia with platelet count 133,000. 2 units of packed red blood cells were ordered for immediate transfusion and the patient received 1 unit prior to her transfer to [facility].  Patient is interviewed and examined on the ward at [facility] where she continues to be afebrile, saturating well on 2 L/m supplemental oxygen, and with tachypnea and tachycardia now resolved. She'll be admitted to the hospital for ongoing evaluation and management of symptomatic anemia with acute recurrence of lower GI bleeding.  Review of Systems:  All other systems reviewed and apart from HPI, are negative.  Past Medical History  Diagnosis Date  . Hypertension   . Cervical cancer ([place])     cervical cancer history  . Iron deficiency anemia 2008  . GI bleed   . History of blood transfusion   . Bilateral cellulitis of lower leg   . CAD (coronary artery disease)   . Fatty liver   . Hyperlipidemia   . GERD (gastroesophageal reflux disease)   . History of nicotine use   . Chronic edema   . Diabetes mellitus with stage 3 chronic kidney disease ([place])   . Trigeminal neuralgia   . Aortic stenosis   . Cardiomyopathy ([place])   . Gout   . Peptic ulcer disease   . Angiodysplasia   . Vascular malformation 06/14/1997  . Adenomatous polyps 03/05/2012  . COPD (chronic obstructive pulmonary disease) ([place])   . CHF (congestive heart failure) ([place])   . Fibrocystic breast changes   . Irritable bowel syndrome   . Arthritis   . Carotid bruit   . Tobacco abuse   . Osteoarthritis   .  Macrocytosis   . Abnormal LFTs (liver function tests)   . PVD (peripheral vascular disease) ([place])   . AV malformation of GI tract   . Clostridium difficile infection March 02, 2015    Past Surgical History  Procedure Laterality Date  . Abdominal hysterectomy    . Bone marrow biopsy  1999  . Angiogram cardiac stenting    . Thoracentesis    . Esophagogastroduodenoscopy  12/2014, 2014, 2013,  2009, 2004  . Colonoscopy  12/2014, 2014, 2013, 2008, 2004    03/05/2012-Adenomatous Polyps;09/23/2003-Polyp remains intact;   . Cholecystectomy    . Tonsillectomy    . Nasal sinus surgery    . Endoscopic carpal tunnel release    . Tee without cardioversion N/A 12/19/2015    Procedure: TRANSESOPHAGEAL ECHOCARDIOGRAM (TEE);  Surgeon: Yolonda Kida, MD;  Location: [facility] ORS;  Service: Cardiovascular;  Laterality: N/A;     reports that she quit smoking about 3 years ago. Her smoking use included Cigarettes. She has a 28 pack-year smoking history. She has never used smokeless tobacco. She reports that she does not drink alcohol or use illicit drugs.  Allergies  Allergen Reactions  . Aspirin Other (See Comments)    bleeding  . Atenolol Other (See Comments)    fatigue  . Clopidogrel Bisulfate Other (See Comments)    bleeding  . Darvon [Propoxyphene] Hives and Other (See Comments)  . Hydrochlorothiazide Other (See Comments)    Fatigue and cramps  . Iodinated Diagnostic Agents Other (See Comments)    Questionable GI bleed  . Nylon Hives  . Other Other (See Comments)    Nylon sutures  . Pravastatin Other (See Comments)    Severe cramping    Family History  Problem Relation Age of Onset  . Parkinsonism Mother   . Heart attack Mother   . Lung cancer Father   . Diabetes Mellitus II Father   . Asthma Sister   . Asthma Brother   . Heart disease Son      Prior to Admission medications   Medication Sig Start Date End Date Taking? Authorizing Provider  acetaminophen (TYLENOL) 500 MG tablet Take 500 mg by mouth at bedtime.    Historical Provider, MD  lisinopril (PRINIVIL,ZESTRIL) 40 MG tablet Take 40 mg by mouth daily.    Historical Provider, MD  metoprolol succinate (TOPROL-XL) 50 MG 24 hr tablet Take 50 mg by mouth daily.     Historical Provider, MD  pantoprazole (PROTONIX) 40 MG tablet Take 40 mg by mouth daily.    Historical Provider, MD  simvastatin (ZOCOR) 20 MG tablet Take  20 mg by mouth at bedtime.     Historical Provider, MD  torsemide (DEMADEX) 20 MG tablet Take 1 tablet by mouth 4 (four) times daily.     Historical Provider, MD    Physical Exam: Filed Vitals:   07/09/16 1850 07/09/16 2055  BP: 178/61 176/57  Pulse: 103 87  Temp: 98.1 F (36.7 C) 97.5 F (36.4 C)  TempSrc: Oral Oral  Resp: 22 22  SpO2: 100% 100%      Constitutional: NAD, calm, comfortable Eyes: PERTLA, lids and conjunctivae normal ENMT: Mucous membranes are moist. Posterior pharynx clear of any exudate or lesions.   Neck: normal, supple, no masses, no thyromegaly Respiratory: clear to auscultation bilaterally, no wheezing, no crackles. Normal respiratory effort.   Cardiovascular: S1 & S2 heard, regular rate and rhythm, grade 4 systolic murmur at upper sternal border. No extremity edema. No significant JVD. Abdomen: No  distension, no tenderness, no masses palpated. Bowel sounds normal.  Musculoskeletal: no clubbing / cyanosis. No joint deformity upper and lower extremities. Normal muscle tone.  Skin: no significant rashes, lesions, ulcers. Warm, dry, well-perfused. Neurologic: CN 2-12 grossly intact. Sensation intact, DTR normal. Strength 5/5 in all 4 limbs.  Psychiatric: Normal judgment and insight. Alert and oriented x 3. Normal mood and affect.     Labs on Admission: I have personally reviewed following labs and imaging studies  CBC:  Recent Labs Lab 07/08/16 0955 07/09/16 1101  WBC  --  5.4  HGB 7.5* 7.4*  HCT 22.5* 22.9*  MCV  --  88.5  PLT  --  272*   Basic Metabolic Panel:  Recent Labs Lab 07/09/16 1101  NA 133*  K 4.6  CL 98*  CO2 24  GLUCOSE 205*  BUN 40*  CREATININE 2.34*  CALCIUM 8.6*   GFR: Estimated Creatinine Clearance: 20.6 mL/min (by C-G formula based on Cr of 2.34). Liver Function Tests:  Recent Labs Lab 07/09/16 1101  AST 24  ALT 17  ALKPHOS 79  BILITOT 1.0  PROT 6.5  ALBUMIN 3.4*   No results for input(s): LIPASE, AMYLASE  in the last 168 hours. No results for input(s): AMMONIA in the last 168 hours. Coagulation Profile: No results for input(s): INR, PROTIME in the last 168 hours. Cardiac Enzymes:  Recent Labs Lab 07/09/16 1414  TROPONINI <0.03   BNP (last 3 results) No results for input(s): PROBNP in the last 8760 hours. HbA1C: No results for input(s): HGBA1C in the last 72 hours. CBG: No results for input(s): GLUCAP in the last 168 hours. Lipid Profile: No results for input(s): CHOL, HDL, LDLCALC, TRIG, CHOLHDL, LDLDIRECT in the last 72 hours. Thyroid Function Tests: No results for input(s): TSH, T4TOTAL, FREET4, T3FREE, THYROIDAB in the last 72 hours. Anemia Panel: No results for input(s): VITAMINB12, FOLATE, FERRITIN, TIBC, IRON, RETICCTPCT in the last 72 hours. Urine analysis:    Component Value Date/Time   COLORURINE Yellow 06/20/2014 1200   APPEARANCEUR Cloudy 06/20/2014 1200   LABSPEC 1.015 06/20/2014 1200   PHURINE 5.0 06/20/2014 1200   GLUCOSEU 50 mg/dL 06/20/2014 1200   HGBUR 1+ 06/20/2014 1200   BILIRUBINUR Negative 06/20/2014 1200   KETONESUR Negative 06/20/2014 1200   PROTEINUR 100 mg/dL 06/20/2014 1200   NITRITE Negative 06/20/2014 1200   LEUKOCYTESUR 3+ 06/20/2014 1200   Sepsis Labs: _0 (procalcitonin:4,lacticidven:4) )No results found for this or any previous visit (from the past 240 hour(s)).   Radiological Exams on Admission: No results found.  EKG: Independently reviewed. Sinus tachycardia (rate 113), ST-depression in lateral leads   Assessment/Plan  1. Acute lower GI bleed with symptomatic anemia  - Pt has recurrent GIB's attributed to angiodysplasia with multiple endoscopies and cautery per pt report  - Followed by outpatient oncology with Procrit and iron infusions  - Bloody diarrhea, described as frank blood, woke her at 4 am 07/09/16; there was a formed stool with BRB later in the day; none since arrival   - Hgb was 7.4 at the outside hospital; had  been 8.8 on 06/24/16  - 1 unit pRBC transfused at outside hospital prior to transfer  - Tachycardia has resolved and BP remains stable  - Check H&H now, type and screen, and transfuse if Hgb <8 or bleeding resumes - IV Protonix q12h for now   2. AKI superimposed on CKD stage III  - SCr 2.34 on admission, up from 1.94 on 06/24/16 and 1.5 prior to that  -  Suspect a prerenal azotemia secondary to acute blood loss and associated hypovolemia  - Anticipate improvement with replacement of volume and blood  - Avoid nephrotoxins; hold lisinopril and torsemide for now  - Repeat chem panel tomorrow    3. Type II DM  - Diet-controlled currently; serum glucose 205 on admission  - Check CBG with meals and qHS - Low-intensity SSI correctional prn    4. Hypertension  - BP elevated at time of admission  - Managed with lisinopril and Toprol at home, will hold lisinopril until renal function stable  - Hydralazine 10 mg IVP's prn    5. Hyponatremia  - Serum sodium 133 on admission in setting of dehydration  - Anticipate resolution with IVF   - Repeat chem panel tomorrow   6. Thrombocytopenia  - Platelet count 133 on admission, stable relative to prior measurements and likely secondary to iron-deficiency   - Ongoing management with iron infusions per oncology   7. EKG changes - ST-depression noted in lateral leads at outside hospital - Troponin was <0.03  - Suspect this represents a demand ischemia in setting of severe anemia; ACS less likely  - Monitor on telemetry, obtain serial troponin measurements, and repeat EKG in am, sooner for angina     DVT prophylaxis: SCD's  Code Status: Full  Family Communication: Discussed with patient  Disposition Plan: Admit to med-surg  Consults called: None  Admission status: Inpatient    Vianne Bulls, MD [facility] Pager [phone]  If 7PM-7AM, please contact night-coverage [email or web address] Password [email or web address]  07/09/2016, 11:45 PM

## 2016-07-09 NOTE — ED Notes (Signed)
[facility] at bedside for transport, pt transferred via stretcher to [place]

## 2016-07-09 NOTE — Telephone Encounter (Signed)
Called to report that she got up at 4 AM and had bloody diarrhea and that her hgb was 7.5 yesterday. I advised her that she needs to go to ER , but she stated that they won't do anything for her, sh ehad been sent there for blood transfusion in past and they told her they do not do that. I explained to her that this is different with the bleeding and she should go. She stated that she no longer has the diarrhea, but she did have another BM with more substance, but was still bloody and that she is so weak she does not know if she could make it. She stated that she feels she needs a blood transfusion. Again I urged her to go to the ER.  I discussed this with Dr Rogue Bussing who agreed that she needs to go to the ER. I informed her that Dr B also said she needs to go to the ER and she stated she would go, but it will take her a while to get there adn she will probably have to sit there all day.

## 2016-07-09 NOTE — ED Notes (Signed)
Pt reports bloody diarrhea since 0400; reports she is severely anemic and goes to the cancer center every Monday and gets infusions.

## 2016-07-09 NOTE — ED Notes (Signed)
Pt became very SOB upon excretion to get clothes off, HR 140s, pt placed on 2L [place]

## 2016-07-10 ENCOUNTER — Encounter (HOSPITAL_COMMUNITY): Payer: Self-pay | Admitting: General Practice

## 2016-07-10 DIAGNOSIS — L8990 Pressure ulcer of unspecified site, unspecified stage: Secondary | ICD-10-CM | POA: Insufficient documentation

## 2016-07-10 LAB — PROTIME-INR
INR: 1.16 (ref 0.00–1.49)
PROTHROMBIN TIME: 15 s (ref 11.6–15.2)

## 2016-07-10 LAB — GLUCOSE, CAPILLARY
GLUCOSE-CAPILLARY: 178 mg/dL — AB (ref 65–99)
GLUCOSE-CAPILLARY: 211 mg/dL — AB (ref 65–99)
Glucose-Capillary: 136 mg/dL — ABNORMAL HIGH (ref 65–99)
Glucose-Capillary: 169 mg/dL — ABNORMAL HIGH (ref 65–99)
Glucose-Capillary: 179 mg/dL — ABNORMAL HIGH (ref 65–99)

## 2016-07-10 LAB — TYPE AND SCREEN
ABO/RH(D): B POS
Antibody Screen: NEGATIVE
UNIT DIVISION: 0
UNIT DIVISION: 0
UNIT DIVISION: 0
Unit division: 0

## 2016-07-10 LAB — BASIC METABOLIC PANEL
Anion gap: 9 (ref 5–15)
BUN: 31 mg/dL — AB (ref 6–20)
CALCIUM: 8.6 mg/dL — AB (ref 8.9–10.3)
CO2: 27 mmol/L (ref 22–32)
CREATININE: 2.23 mg/dL — AB (ref 0.44–1.00)
Chloride: 101 mmol/L (ref 101–111)
GFR calc Af Amer: 23 mL/min — ABNORMAL LOW (ref >60)
GFR, EST NON AFRICAN AMERICAN: 20 mL/min — AB (ref >60)
GLUCOSE: 133 mg/dL — AB (ref 65–99)
POTASSIUM: 4.7 mmol/L (ref 3.5–5.1)
SODIUM: 137 mmol/L (ref 135–145)

## 2016-07-10 LAB — HEMOGLOBIN AND HEMATOCRIT, BLOOD
HCT: 32.4 % — ABNORMAL LOW (ref 36.0–46.0)
HEMATOCRIT: 25.1 % — AB (ref 36.0–46.0)
Hemoglobin: 8 g/dL — ABNORMAL LOW (ref 12.0–15.0)
Hemoglobin: 9.9 g/dL — ABNORMAL LOW (ref 12.0–15.0)

## 2016-07-10 LAB — TROPONIN I
TROPONIN I: 0.03 ng/mL — AB (ref <0.03)
TROPONIN I: 0.09 ng/mL — AB (ref <0.03)
Troponin I: 0.04 ng/mL (ref <0.03)

## 2016-07-10 LAB — PREPARE RBC (CROSSMATCH)

## 2016-07-10 LAB — ABO/RH: ABO/RH(D): B POS

## 2016-07-10 LAB — HEMOGLOBIN: Hemoglobin: 7.5 g/dL — ABNORMAL LOW (ref 12.0–15.0)

## 2016-07-10 LAB — HEMATOCRIT: HCT: 24.4 % — ABNORMAL LOW (ref 36.0–46.0)

## 2016-07-10 MED ORDER — SODIUM CHLORIDE 0.9 % IV SOLN
Freq: Once | INTRAVENOUS | Status: AC
  Administered 2016-07-10: 10 mL/h via INTRAVENOUS

## 2016-07-10 MED ORDER — POLYETHYLENE GLYCOL 3350 17 G PO PACK
17.0000 g | PACK | Freq: Four times a day (QID) | ORAL | Status: AC
  Administered 2016-07-10 – 2016-07-11 (×4): 17 g via ORAL
  Filled 2016-07-10 (×4): qty 1

## 2016-07-10 MED ORDER — SODIUM CHLORIDE 0.9 % IV SOLN
Freq: Once | INTRAVENOUS | Status: AC
  Administered 2016-07-12: 14:00:00 via INTRAVENOUS

## 2016-07-10 MED ORDER — PEG 3350-KCL-NA BICARB-NACL 420 G PO SOLR
4000.0000 mL | Freq: Once | ORAL | Status: AC
  Administered 2016-07-11: 4000 mL via ORAL
  Filled 2016-07-10: qty 4000

## 2016-07-10 MED ORDER — PANTOPRAZOLE SODIUM 40 MG IV SOLR
40.0000 mg | Freq: Two times a day (BID) | INTRAVENOUS | Status: DC
  Administered 2016-07-10 – 2016-07-13 (×8): 40 mg via INTRAVENOUS
  Filled 2016-07-10 (×8): qty 40

## 2016-07-10 MED ORDER — SODIUM CHLORIDE 0.9% FLUSH
10.0000 mL | INTRAVENOUS | Status: DC | PRN
  Administered 2016-07-11: 10 mL
  Filled 2016-07-10: qty 40

## 2016-07-10 NOTE — Progress Notes (Signed)
CRITICAL VALUE ALERT  Critical value received:  Troponin 0.04  Date of notification:  07/10/2016  Time of notification:  01:43  Critical value read back: Yes  Nurse who received alert:  Jairo Ben, RN  MD notified (1st page):  Lamar Blinks, PA  Time of first page:  01:45  MD notified (2nd page):  Time of second page:  Responding MD:  Lamar Blinks, PA  Time MD responded:  01:50

## 2016-07-10 NOTE — Progress Notes (Signed)
Pt was given mirilax and clear liqs per GI, and within a few minutes liquid passed through her and she had large amount of dark maroon blood with clots per rectum. Pt was cleaned up, gown and linens changed and assisted back to bed from bsc. Will continue to monitor.

## 2016-07-10 NOTE — Progress Notes (Signed)
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 She has required multiple blood transfusions in the past. She presented as a transfer from [facility] on 07/09/2016 for 2 day history of hematochezia. She was found to have a hemoglobin of 7.4 and transfused total of 2 units of packed red blood cells with repeat hemoglobin at 8.0. She continued to have bloody stools. An additional unit of blood was ordered. Meanwhile Dr. Cristina Gong of gastroenterology was consulted. Recurrent bleed from AVM was suspected.   Assessment & Plan:   Principal Problem:   Symptomatic anemia Active Problems:   Angiodysplasia   Chronic kidney disease (CKD), stage III (moderate)   Type II diabetes mellitus with neurological manifestations (HCC)   Benign hypertension   Iron deficiency anemia due to chronic blood loss   Thrombocytopenia (HCC)   Acute lower GI bleeding   Hyponatremia   AKI (acute kidney injury) ([place])   Pressure ulcer   1.  Suspected lower GI bleed secondary to arteriovenous malformations -Morgan [name] has a known history of arterial venous malformations, undergoing prior upper and lower endoscopies, and were treated with argon plasma coagulation. -She presents with painless hematochezia that started 2 days ago. -Case was discussed with Dr. Cristina Gong of gastroenterology, recommending further proceed with upper and lower endoscopy as she continues to bleed. -Plan to transfuse third unit of packed red blood cells as afternoon  2.  Acute blood loss anemia. -Likely secondary to GI bleeding from arteriovenous malformations -She has required multiple blood transfusions and past related to GI bleed -Overnight she was transfused 2 units packed blood cells with  hemoglobin only trending up to 8.0, plan to transfuse a third unit of packed red blood cells today -Repeat CBC in a.m.  3.  Acute on chronic renal failure -She presents with creatinine of 2.3, having baseline creatinine between 1.5 and 2.0 -Likely secondary to hypovolemia from GI bleed -Receiving blood transfusion -ACE inhibitor stopped  4.  Hypertension. -Blood pressures stable, last blood pressure 123/63 -Continue metoprolol 50 mg by mouth daily. Lisinopril stopped due to acute on chronic renal failure   DVT prophylaxis: SCDs Code Status: Full code Family Communication:  Disposition Plan: Anticipate discharge home in the next 48-72 hours  Consultants:   Gastroenterology  Procedures:     Antimicrobials:      Subjective: She reports ongoing bloody stools denies chest pain, shortness of breath  Objective: Filed Vitals:   07/10/16 0425 07/10/16 1037 07/10/16 1218 07/10/16 1234  BP: 128/31 156/50 123/62 123/63  Pulse: 82 86 69 64  Temp: 97.9 F (36.6 C)  98.8 F (37.1 C) 98 F (36.7 C)  TempSrc: Oral  Oral Oral  Resp: '20  20 18  '$ Height: '5\' 3"'$  (1.6 m)     Weight: 84.641 kg (186 lb 9.6 oz)     SpO2: 100%  99% 99%    Intake/Output Summary (Last 24 hours) at 07/10/16 1446 Last data filed at 07/10/16 1440  Gross per 24 hour  Intake    640 ml  Output    801 ml  Net   -161 ml   Filed Weights   07/10/16 0425  Weight: 84.641 kg (186 lb 9.6 oz)    Examination:  General exam: Pale appearing, no acute distress, mentating well Respiratory system:  Clear to auscultation. Respiratory effort normal. Cardiovascular system: S1 & S2 heard, RRR. No JVD, murmurs, rubs, gallops or clicks. No pedal edema. Gastrointestinal system: Abdomen is nondistended, soft and nontender. No organomegaly or masses felt. Normal bowel sounds heard. Central nervous system: Alert and oriented. No focal neurological deficits. Extremities: Symmetric 5 x 5 power. Skin: No rashes, lesions or  ulcers Psychiatry: Judgement and insight appear normal. Mood & affect appropriate.     Data Reviewed: I have personally reviewed following labs and imaging studies  CBC:  Recent Labs Lab 07/08/16 0955 07/09/16 1101 07/09/16 2359 07/10/16 0555  WBC  --  5.4  --   --   HGB 7.5* 7.4* 7.5* 8.0*  HCT 22.5* 22.9* 24.4* 25.1*  MCV  --  88.5  --   --   PLT  --  133*  --   --    Basic Metabolic Panel:  Recent Labs Lab 07/09/16 1101 07/10/16 0555  NA 133* 137  K 4.6 4.7  CL 98* 101  CO2 24 27  GLUCOSE 205* 133*  BUN 40* 31*  CREATININE 2.34* 2.23*  CALCIUM 8.6* 8.6*   GFR: Estimated Creatinine Clearance: 21.4 mL/min (by C-G formula based on Cr of 2.23). Liver Function Tests:  Recent Labs Lab 07/09/16 1101  AST 24  ALT 17  ALKPHOS 79  BILITOT 1.0  PROT 6.5  ALBUMIN 3.4*   No results for input(s): LIPASE, AMYLASE in the last 168 hours. No results for input(s): AMMONIA in the last 168 hours. Coagulation Profile:  Recent Labs Lab 07/09/16 2359  INR 1.16   Cardiac Enzymes:  Recent Labs Lab 07/09/16 1414 07/09/16 2359 07/10/16 0555 07/10/16 1207  TROPONINI <0.03 0.04* 0.03* 0.09*   BNP (last 3 results) No results for input(s): PROBNP in the last 8760 hours. HbA1C: No results for input(s): HGBA1C in the last 72 hours. CBG:  Recent Labs Lab 07/10/16 0014 07/10/16 0628 07/10/16 1135  GLUCAP 178* 136* 169*   Lipid Profile: No results for input(s): CHOL, HDL, LDLCALC, TRIG, CHOLHDL, LDLDIRECT in the last 72 hours. Thyroid Function Tests: No results for input(s): TSH, T4TOTAL, FREET4, T3FREE, THYROIDAB in the last 72 hours. Anemia Panel: No results for input(s): VITAMINB12, FOLATE, FERRITIN, TIBC, IRON, RETICCTPCT in the last 72 hours. Sepsis Labs: No results for input(s): PROCALCITON, LATICACIDVEN in the last 168 hours.  No results found for this or any previous visit (from the past 240 hour(s)).       Radiology Studies: No results  found.      Scheduled Meds: . sodium chloride   Intravenous Once  . acetaminophen  500 mg Oral QHS  . insulin aspart  0-9 Units Subcutaneous TID WC  . metoprolol succinate  50 mg Oral Daily  . pantoprazole (PROTONIX) IV  40 mg Intravenous Q12H  . polyethylene glycol  17 g Oral QID  . [START ON 07/11/2016] polyethylene glycol-electrolytes  4,000 mL Oral Once  . simvastatin  20 mg Oral QHS  . sodium chloride flush  3 mL Intravenous Q12H   Continuous Infusions:    LOS: 1 day    Time spent: 35 min    Kelvin Cellar, MD [facility] Pager [phone]  If 7PM-7AM, please contact night-coverage [email or web address] Password [facility] 07/10/2016, 2:46 PM

## 2016-07-10 NOTE — Consult Note (Signed)
Referring Provider:   Dr. Coralyn Pear ([facility]) Primary Care Physician:  Kirk Ruths., MD Primary Gastroenterologist:  Dr. Gaylyn Cheers [facility])  Reason for Consultation:  GI bleeding  HPI: Morgan [name] is a 81 y.o. female transferred from [facility] because of a 2 day history of hematochezia, characterized by several episodes of fairly bright red blood per rectum, associated with increased dyspnea (on minimal exertion such as walking across a room), as well as some chest pain (ruled out for MI by enzymes at [facility], per family report).  This patient has a history of gastric and colonic AVMs with associated chronic GI blood loss and chronic anemia, now transfused accumulative total of 64 units of blood over the past 4 years, according to the patient and her family. She is followed by a hematologist in [place], where she resides, as well as by Dr. Verta Ellen who has performed numerous procedures on her.   Per telephone conversation with Dr. Percell [place] office, the patient's most recent colonoscopy was 01/18/2015, at which time multiple nonbleeding colonic AVMs were found in the cecum and proximal descending colon, and were treated with argon plasma coagulation. Her most recent endoscopy was 07/11/2014, at which time 2 small AVMs of the gastric body were treated with argon plasma coagulation. Capsule endoscopy on 06/03/2012, was negative for small bowel AVMs.   With that background, the patient typically has visible bleeding roughly once a month. She becomes symptomatic with a hemoglobin below 8. The bleeding started 2 days ago, and yesterday, she also developed some mild left-sided and left upper quadrant abdominal discomfort with some degree of diarrhea.  The patient is no longer on aspirin or blood thinners but apparently was in the past.  The patient and her family have the impression that her transfusion requirement slowed down for a period of  time after her last argon plasma coagulation treatments.     Past Medical History  Diagnosis Date  . Hypertension   . Cervical cancer ([place])     cervical cancer history  . Iron deficiency anemia 2008  . GI bleed   . History of blood transfusion   . Bilateral cellulitis of lower leg   . CAD (coronary artery disease)   . Fatty liver   . Hyperlipidemia   . GERD (gastroesophageal reflux disease)   . History of nicotine use   . Chronic edema   . Diabetes mellitus with stage 3 chronic kidney disease ([place])   . Trigeminal neuralgia   . Aortic stenosis   . Cardiomyopathy ([place])   . Gout   . Peptic ulcer disease   . Angiodysplasia   . Vascular malformation 06/14/1997  . Adenomatous polyps 03/05/2012  . COPD (chronic obstructive pulmonary disease) ([place])   . CHF (congestive heart failure) ([place])   . Fibrocystic breast changes   . Irritable bowel syndrome   . Arthritis   . Carotid bruit   . Tobacco abuse   . Osteoarthritis   . Macrocytosis   . Abnormal LFTs (liver function tests)   . PVD (peripheral vascular disease) ([place])   . AV malformation of GI tract   . Clostridium difficile infection March 02, 2015  . Complication of anesthesia      I have trouble waking up "    Past Surgical History  Procedure Laterality Date  . Abdominal hysterectomy    . Bone marrow biopsy  1999  . Angiogram cardiac stenting    . Thoracentesis    . Esophagogastroduodenoscopy  12/2014, 2014, 2013, 2009, 2004  . Colonoscopy  12/2014, 2014, 2013, 2008, 2004    03/05/2012-Adenomatous Polyps;09/23/2003-Polyp remains intact;   . Cholecystectomy    . Tonsillectomy    . Nasal sinus surgery    . Endoscopic carpal tunnel release    . Tee without cardioversion N/A 12/19/2015    Procedure: TRANSESOPHAGEAL ECHOCARDIOGRAM (TEE);  Surgeon: Yolonda Kida, MD;  Location: [facility] ORS;  Service: Cardiovascular;  Laterality: N/A;    Prior to Admission medications   Medication Sig Start Date End Date Taking?  Authorizing Provider  acetaminophen (TYLENOL) 500 MG tablet Take 500 mg by mouth at bedtime.   Yes Historical Provider, MD  lisinopril (PRINIVIL,ZESTRIL) 40 MG tablet Take 40 mg by mouth daily.   Yes Historical Provider, MD  metoprolol succinate (TOPROL-XL) 50 MG 24 hr tablet Take 50 mg by mouth daily.    Yes Historical Provider, MD  pantoprazole (PROTONIX) 40 MG tablet Take 40 mg by mouth daily.   Yes Historical Provider, MD  simvastatin (ZOCOR) 20 MG tablet Take 20 mg by mouth at bedtime.    Yes Historical Provider, MD  torsemide (DEMADEX) 20 MG tablet Take 1 tablet by mouth 4 (four) times daily.    Yes Historical Provider, MD    Current Facility-Administered Medications  Medication Dose Route Frequency Provider Last Rate Last Dose  . 0.9 %  sodium chloride infusion   Intravenous Once Jeryl Columbia, NP      . acetaminophen (TYLENOL) tablet 650 mg  650 mg Oral Q6H PRN Vianne Bulls, MD       Or  . acetaminophen (TYLENOL) suppository 650 mg  650 mg Rectal Q6H PRN Vianne Bulls, MD      . acetaminophen (TYLENOL) tablet 500 mg  500 mg Oral QHS Ilene Qua Opyd, MD   500 mg at 07/10/16 0016  . hydrALAZINE (APRESOLINE) injection 10 mg  10 mg Intravenous Q4H PRN Vianne Bulls, MD      . HYDROcodone-acetaminophen (NORCO/VICODIN) 5-325 MG per tablet 1-2 tablet  1-2 tablet Oral Q4H PRN Ilene Qua Opyd, MD      . insulin aspart (novoLOG) injection 0-9 Units  0-9 Units Subcutaneous TID WC Vianne Bulls, MD   0 Units at 07/10/16 0700  . metoprolol succinate (TOPROL-XL) 24 hr tablet 50 mg  50 mg Oral Daily Vianne Bulls, MD   50 mg at 07/10/16 1038  . ondansetron (ZOFRAN) tablet 4 mg  4 mg Oral Q6H PRN Vianne Bulls, MD       Or  . ondansetron (ZOFRAN) injection 4 mg  4 mg Intravenous Q6H PRN Ilene Qua Opyd, MD      . pantoprazole (PROTONIX) injection 40 mg  40 mg Intravenous Q12H Vianne Bulls, MD   40 mg at 07/10/16 1037  . simvastatin (ZOCOR) tablet 20 mg  20 mg Oral QHS Vianne Bulls, MD    20 mg at 07/10/16 0023  . sodium chloride flush (NS) 0.9 % injection 10-40 mL  10-40 mL Intracatheter PRN Ilene Qua Opyd, MD      . sodium chloride flush (NS) 0.9 % injection 3 mL  3 mL Intravenous Q12H Vianne Bulls, MD   3 mL at 07/10/16 1040    Allergies as of 07/09/2016 - Review Complete 07/09/2016  Allergen Reaction Noted  . Aspirin Other (See Comments) 11/08/2015  . Atenolol Other (See Comments) 05/19/2015  . Clopidogrel bisulfate Other (See Comments) 11/08/2015  . Darvon [propoxyphene] Hives  and Other (See Comments) 04/25/2015  . Hydrochlorothiazide Other (See Comments) 05/19/2015  . Iodinated diagnostic agents Other (See Comments) 05/19/2015  . Nylon Hives 04/25/2015  . Other Other (See Comments) 05/19/2015  . Pravastatin Other (See Comments) 05/19/2015    Family History  Problem Relation Age of Onset  . Parkinsonism Mother   . Heart attack Mother   . Lung cancer Father   . Diabetes Mellitus II Father   . Asthma Sister   . Asthma Brother   . Heart disease Son     Social History   Social History  . Marital Status: Married    Spouse Name: N/A  . Number of Children: N/A  . Years of Education: N/A   Occupational History  . Not on file.   Social History Main Topics  . Smoking status: Former Smoker -- 0.50 packs/day for 56 years    Types: Cigarettes    Quit date: 05/23/2013  . Smokeless tobacco: Never Used  . Alcohol Use: No  . Drug Use: No  . Sexual Activity: No   Other Topics Concern  . Not on file   Social History Narrative    Review of Systems: Reflux symptoms controlled by Protonix, diarrhea when she gets Procrit, chronic dyspnea because of anemia, chest pain when she become severely anemic  Physical Exam: Vital signs in last 24 hours: Temp:  [97.5 F (36.4 C)-98.8 F (37.1 C)] 98 F (36.7 C) (07/19 1234) Pulse Rate:  [64-105] 64 (07/19 1234) Resp:  [18-33] 18 (07/19 1234) BP: (123-185)/(31-77) 123/63 mmHg (07/19 1234) SpO2:  [99 %-100 %] 99 %  (07/19 1234) Weight:  [84.641 kg (186 lb 9.6 oz)] 84.641 kg (186 lb 9.6 oz) (07/19 0425) Last BM Date: 07/10/16 General:  Pale, alert,  Well-developed, thin, pleasant, articulate and cooperative in NAD Head:  Normocephalic and atraumatic. Eyes:  Sclera clear, no icterus.   Conjunctiva pale. Mouth:   No ulcerations or lesions. Poor dentition.  Oropharynx pink & dry. Lungs:  Clear throughout to auscultation.   No wheezes, crackles, or rhonchi. No evident respiratory distress. Heart:   Regular rate and rhythm; prominent 4/6 systolic murmur in aortic area. Abdomen:  Soft, nontender, and nondistended. No masses, hepatosplenomegaly or ventral hernias noted.   Msk:   Symmetrical without gross deformities. Pulses:  Normal radial pulse is noted. Extremities:  2-3+ chronic edema of lower extremities, with chronic skin changes. Neurologic:  Alert and coherent;  grossly normal neurologically. Skin:  Intact without significant lesions or rashes. Stasis changes of lower extremities as noted. Psych:   Alert and cooperative. Normal mood and affect.  Intake/Output from previous day: 07/18 0701 - 07/19 0700 In: 335 [Blood:335] Out: 800 [Urine:800] Intake/Output this shift: Total I/O In: 35 [I.V.:10; Blood:25] Out: 1 [Stool:1]  Lab Results:  Recent Labs  07/09/16 1101 07/09/16 2359 07/10/16 0555  WBC 5.4  --   --   HGB 7.4* 7.5* 8.0*  HCT 22.9* 24.4* 25.1*  PLT 133*  --   --    BMET  Recent Labs  07/09/16 1101 07/10/16 0555  NA 133* 137  K 4.6 4.7  CL 98* 101  CO2 24 27  GLUCOSE 205* 133*  BUN 40* 31*  CREATININE 2.34* 2.23*  CALCIUM 8.6* 8.6*   LFT  Recent Labs  07/09/16 1101  PROT 6.5  ALBUMIN 3.4*  AST 24  ALT 17  ALKPHOS 79  BILITOT 1.0   PT/INR  Recent Labs  07/09/16 2359  LABPROT 15.0  INR 1.16  Studies/Results: No results found.  Impression: 1. Recurrent GI bleeding 2. Acute posthemorrhagic anemia, superimposed on chronic anemia, status post  transfusion of 64 units of blood cumulatively 3. History of gastric and colonic AVMs per outside gastroenterologist, last treated approximately 1-1/2-2 years ago  Plan: Agree with plan for transfusion. Will initiate prep to clean out her colon, with the intent of doing endoscopy and colonoscopy 2 days from now. The risks (including perforation with need for emergency surgery, or exacerbation of bleeding, and anesthesia risk) and purpose and reasonable expectations of results from those procedures were discussed at length with the patient, and her son and sister who are at the bedside. They are agreeable to proceed. We also discussed the option of surgical resection of her proximal colon, but the patient states that she has been told that she is not a surgical candidate because of her bad heart valves, and we agreed that the benefit of such surgery would be uncertain anyway.   LOS: 1 day   Theo Reither V  07/10/2016, 1:32 PM   Pager [phone] If no answer or after 5 PM call [phone]

## 2016-07-11 LAB — TYPE AND SCREEN
ABO/RH(D): B POS
ANTIBODY SCREEN: NEGATIVE
UNIT DIVISION: 0
UNIT DIVISION: 0

## 2016-07-11 LAB — BASIC METABOLIC PANEL
ANION GAP: 3 — AB (ref 5–15)
BUN: 30 mg/dL — ABNORMAL HIGH (ref 6–20)
CALCIUM: 8.3 mg/dL — AB (ref 8.9–10.3)
CO2: 27 mmol/L (ref 22–32)
CREATININE: 2.12 mg/dL — AB (ref 0.44–1.00)
Chloride: 109 mmol/L (ref 101–111)
GFR calc non Af Amer: 21 mL/min — ABNORMAL LOW (ref >60)
GFR, EST AFRICAN AMERICAN: 25 mL/min — AB (ref >60)
GLUCOSE: 136 mg/dL — AB (ref 65–99)
Potassium: 4.8 mmol/L (ref 3.5–5.1)
Sodium: 139 mmol/L (ref 135–145)

## 2016-07-11 LAB — HEMOGLOBIN A1C
HEMOGLOBIN A1C: 6.9 % — AB (ref 4.8–5.6)
MEAN PLASMA GLUCOSE: 151 mg/dL

## 2016-07-11 LAB — GLUCOSE, CAPILLARY
Glucose-Capillary: 158 mg/dL — ABNORMAL HIGH (ref 65–99)
Glucose-Capillary: 228 mg/dL — ABNORMAL HIGH (ref 65–99)
Glucose-Capillary: 156 mg/dL — ABNORMAL HIGH (ref 65–99)
Glucose-Capillary: 145 mg/dL — ABNORMAL HIGH (ref 65–99)

## 2016-07-11 LAB — CBC
HEMATOCRIT: 25.3 % — AB (ref 36.0–46.0)
Hemoglobin: 8 g/dL — ABNORMAL LOW (ref 12.0–15.0)
MCH: 28.3 pg (ref 26.0–34.0)
MCHC: 31.6 g/dL (ref 30.0–36.0)
MCV: 89.4 fL (ref 78.0–100.0)
Platelets: 165 10*3/uL (ref 150–400)
RBC: 2.83 MIL/uL — ABNORMAL LOW (ref 3.87–5.11)
RDW: 18.5 % — AB (ref 11.5–15.5)
WBC: 4.3 10*3/uL (ref 4.0–10.5)

## 2016-07-11 MED ORDER — SODIUM CHLORIDE 0.9 % IV SOLN
INTRAVENOUS | Status: DC
  Administered 2016-07-12: 04:00:00 via INTRAVENOUS

## 2016-07-11 NOTE — Care Management Important Message (Signed)
Important Message  Patient Details  Name: BREE HEINZELMAN MRN: [id number] Date of Birth: [birth year]   Medicare Important Message Given:  Yes    Anes Rigel Abena 07/11/2016, 10:56 AM

## 2016-07-11 NOTE — Progress Notes (Signed)
Still passing some clots, but it appears that the stool is becoming clear on Miralax.  Remainder of prep to start later today.  Hgb this morning 8.0--appears to have rec'd 2 u prc's since adm here, with hgb going from 7.5 to 8.0.  For egd/colon c. 12:45 pm tomorrow--questions answered.  Cleotis Nipper, M.D. Pager [phone] If no answer or after 5 PM call [phone]

## 2016-07-11 NOTE — Progress Notes (Signed)
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 She has required multiple blood transfusions in the past. She presented as a transfer from [place] on 07/09/2016 for 2 day history of hematochezia. She was found to have a hemoglobin of 7.4 and transfused total of 2 units of packed red blood cells with repeat hemoglobin at 8.0. She continued to have bloody stools. An additional unit of blood was ordered. Meanwhile Dr. Cristina Gong of gastroenterology was consulted. Recurrent bleed from AVM was suspected.   Assessment & Plan:   Principal Problem:   Symptomatic anemia Active Problems:   Angiodysplasia   Chronic kidney disease (CKD), stage III (moderate)   Type II diabetes mellitus with neurological manifestations (HCC)   Benign hypertension   Iron deficiency anemia due to chronic blood loss   Thrombocytopenia (HCC)   Acute lower GI bleeding   Hyponatremia   AKI (acute kidney injury) ([place])   Pressure ulcer   1.  Suspected lower GI bleed secondary to arteriovenous malformations -Morgan [name] has a known history of arterial venous malformations, undergoing prior upper and lower endoscopies, and were treated with argon plasma coagulation. -She presents with painless hematochezia that started 2 days ago. -On 07/10/2016: Case was discussed with Dr. Cristina Gong of gastroenterology, recommending further proceed with upper and lower endoscopy as she continues to bleed. -On 07/11/2016 bleeding appears to slow down, though has some clots. GI recommending colonoscopy/EGD on 07/12/2016. Plan for her to undergo prep this afternoon.  2.  Acute blood loss anemia. -Likely secondary to GI bleeding from arteriovenous malformations -She has required multiple blood  transfusions and past related to GI bleed -Overnight she was transfused 2 units packed blood cells with hemoglobin only trending up to 8.0, plan to transfuse a third unit of packed red blood cells today -On 07/11/2016 Repeat lab work showing hemoglobin of 8.0. GI bleed slowing down   3.  Acute on chronic renal failure -She presents with creatinine of 2.3, having baseline creatinine between 1.5 and 2.0 -Likely secondary to hypovolemia from GI bleed -Receiving blood transfusion -ACE inhibitor stopped -On 07/11/2016 creatinine trending down to 2.1  4.  Hypertension. -Blood pressures stable -Continue metoprolol 50 mg by mouth daily. Lisinopril stopped due to acute on chronic renal failure   DVT prophylaxis: SCDs Code Status: Full code Family Communication:  Disposition Plan: Anticipate discharge home in the next 24-48 hours  Consultants:   Gastroenterology  Procedures:     Antimicrobials:      Subjective: She reports improvement to GI bleed, denies chest pain or shortness of breath  Objective: Filed Vitals:   07/10/16 1719 07/10/16 2028 07/11/16 0536 07/11/16 1547  BP: 138/52 139/50 139/48 147/80  Pulse: 74 74 79 69  Temp: 97.3 F (36.3 C) 97.8 F (36.6 C) 98 F (36.7 C) 97.5 F (36.4 C)  TempSrc: Oral Oral Oral Oral  Resp: '18 19 17 18  '$ Height:      Weight:   85.367 kg (188 lb 3.2 oz)   SpO2: 100% 100% 100% 100%    Intake/Output Summary (Last 24 hours) at 07/11/16 1611 Last data filed at 07/11/16 0420  Gross per 24 hour  Intake     10 ml  Output      1 ml  Net  9 ml   Filed Weights   07/10/16 0425 07/11/16 0536  Weight: 84.641 kg (186 lb 9.6 oz) 85.367 kg (188 lb 3.2 oz)    Examination:  General exam: Pale appearing, no acute distress, mentating well Respiratory system: Clear to auscultation. Respiratory effort normal. Cardiovascular system: S1 & S2 heard, RRR. No JVD, murmurs, rubs, gallops or clicks. No pedal edema. Gastrointestinal system:  Abdomen is nondistended, soft and nontender. No organomegaly or masses felt. Normal bowel sounds heard. Central nervous system: Alert and oriented. No focal neurological deficits. Extremities: Symmetric 5 x 5 power. Skin: No rashes, lesions or ulcers Psychiatry: Judgement and insight appear normal. Mood & affect appropriate.     Data Reviewed: I have personally reviewed following labs and imaging studies  CBC:  Recent Labs Lab 07/09/16 1101 07/09/16 2359 07/10/16 0555 07/10/16 1630 07/11/16 0420  WBC 5.4  --   --   --  4.3  HGB 7.4* 7.5* 8.0* 9.9* 8.0*  HCT 22.9* 24.4* 25.1* 32.4* 25.3*  MCV 88.5  --   --   --  89.4  PLT 133*  --   --   --  941   Basic Metabolic Panel:  Recent Labs Lab 07/09/16 1101 07/10/16 0555 07/11/16 0420  NA 133* 137 139  K 4.6 4.7 4.8  CL 98* 101 109  CO2 '24 27 27  '$ GLUCOSE 205* 133* 136*  BUN 40* 31* 30*  CREATININE 2.34* 2.23* 2.12*  CALCIUM 8.6* 8.6* 8.3*   GFR: Estimated Creatinine Clearance: 22.6 mL/min (by C-G formula based on Cr of 2.12). Liver Function Tests:  Recent Labs Lab 07/09/16 1101  AST 24  ALT 17  ALKPHOS 79  BILITOT 1.0  PROT 6.5  ALBUMIN 3.4*   No results for input(s): LIPASE, AMYLASE in the last 168 hours. No results for input(s): AMMONIA in the last 168 hours. Coagulation Profile:  Recent Labs Lab 07/09/16 2359  INR 1.16   Cardiac Enzymes:  Recent Labs Lab 07/09/16 1414 07/09/16 2359 07/10/16 0555 07/10/16 1207  TROPONINI <0.03 0.04* 0.03* 0.09*   BNP (last 3 results) No results for input(s): PROBNP in the last 8760 hours. HbA1C:  Recent Labs  07/10/16 0555  HGBA1C 6.9*   CBG:  Recent Labs Lab 07/10/16 1655 07/10/16 2154 07/11/16 0603 07/11/16 1123 07/11/16 1608  GLUCAP 211* 179* 158* 228* 156*   Lipid Profile: No results for input(s): CHOL, HDL, LDLCALC, TRIG, CHOLHDL, LDLDIRECT in the last 72 hours. Thyroid Function Tests: No results for input(s): TSH, T4TOTAL, FREET4,  T3FREE, THYROIDAB in the last 72 hours. Anemia Panel: No results for input(s): VITAMINB12, FOLATE, FERRITIN, TIBC, IRON, RETICCTPCT in the last 72 hours. Sepsis Labs: No results for input(s): PROCALCITON, LATICACIDVEN in the last 168 hours.  No results found for this or any previous visit (from the past 240 hour(s)).       Radiology Studies: No results found.      Scheduled Meds: . sodium chloride   Intravenous Once  . acetaminophen  500 mg Oral QHS  . insulin aspart  0-9 Units Subcutaneous TID WC  . metoprolol succinate  50 mg Oral Daily  . pantoprazole (PROTONIX) IV  40 mg Intravenous Q12H  . simvastatin  20 mg Oral QHS  . sodium chloride flush  3 mL Intravenous Q12H   Continuous Infusions:    LOS: 2 days    Time spent: 30 min    Kelvin Cellar, MD [facility] Pager [phone]  If 7PM-7AM, please contact night-coverage [email or web address] Password  [email or web address] 07/11/2016, 4:11 PM

## 2016-07-12 ENCOUNTER — Encounter (HOSPITAL_COMMUNITY)
Admission: AD | Disposition: A | Discharge status: Home / Self Care | Payer: Self-pay | Source: Other Acute Inpatient Hospital | Attending: Internal Medicine

## 2016-07-12 ENCOUNTER — Encounter (HOSPITAL_COMMUNITY): Payer: Self-pay | Admitting: *Deleted

## 2016-07-12 ENCOUNTER — Inpatient Hospital Stay (HOSPITAL_COMMUNITY): Payer: Medicare Other | Admitting: Certified Registered"

## 2016-07-12 HISTORY — PX: HOT HEMOSTASIS: SHX5433

## 2016-07-12 HISTORY — PX: ESOPHAGOGASTRODUODENOSCOPY (EGD) WITH PROPOFOL: SHX5813

## 2016-07-12 HISTORY — PX: COLONOSCOPY: SHX5424

## 2016-07-12 LAB — GLUCOSE, CAPILLARY
GLUCOSE-CAPILLARY: 137 mg/dL — AB (ref 65–99)
GLUCOSE-CAPILLARY: 169 mg/dL — AB (ref 65–99)
Glucose-Capillary: 144 mg/dL — ABNORMAL HIGH (ref 65–99)

## 2016-07-12 LAB — BASIC METABOLIC PANEL
ANION GAP: 5 (ref 5–15)
BUN: 23 mg/dL — AB (ref 6–20)
CHLORIDE: 112 mmol/L — AB (ref 101–111)
CO2: 23 mmol/L (ref 22–32)
Calcium: 8.3 mg/dL — ABNORMAL LOW (ref 8.9–10.3)
Creatinine, Ser: 1.76 mg/dL — ABNORMAL HIGH (ref 0.44–1.00)
GFR calc Af Amer: 31 mL/min — ABNORMAL LOW (ref >60)
GFR, EST NON AFRICAN AMERICAN: 27 mL/min — AB (ref >60)
GLUCOSE: 125 mg/dL — AB (ref 65–99)
POTASSIUM: 4.2 mmol/L (ref 3.5–5.1)
Sodium: 140 mmol/L (ref 135–145)

## 2016-07-12 LAB — CBC
HCT: 26 % — ABNORMAL LOW (ref 36.0–46.0)
HEMOGLOBIN: 8.1 g/dL — AB (ref 12.0–15.0)
MCH: 28 pg (ref 26.0–34.0)
MCHC: 31.2 g/dL (ref 30.0–36.0)
MCV: 90 fL (ref 78.0–100.0)
PLATELETS: 118 10*3/uL — AB (ref 150–400)
RBC: 2.89 MIL/uL — AB (ref 3.87–5.11)
RDW: 18.6 % — ABNORMAL HIGH (ref 11.5–15.5)
WBC: 4 10*3/uL (ref 4.0–10.5)

## 2016-07-12 LAB — PREPARE RBC (CROSSMATCH)

## 2016-07-12 SURGERY — ESOPHAGOGASTRODUODENOSCOPY (EGD) WITH PROPOFOL
Anesthesia: Monitor Anesthesia Care

## 2016-07-12 MED ORDER — FUROSEMIDE 10 MG/ML IJ SOLN
INTRAMUSCULAR | Status: AC
  Filled 2016-07-12: qty 4

## 2016-07-12 MED ORDER — TORSEMIDE 20 MG PO TABS
40.0000 mg | ORAL_TABLET | Freq: Two times a day (BID) | ORAL | Status: DC
  Administered 2016-07-12 – 2016-07-13 (×2): 40 mg via ORAL
  Filled 2016-07-12 (×3): qty 2

## 2016-07-12 MED ORDER — LISINOPRIL 40 MG PO TABS
40.0000 mg | ORAL_TABLET | Freq: Every day | ORAL | Status: DC
  Administered 2016-07-12 – 2016-07-13 (×2): 40 mg via ORAL
  Filled 2016-07-12 (×3): qty 1

## 2016-07-12 MED ORDER — FUROSEMIDE 10 MG/ML IJ SOLN
10.0000 mg | Freq: Once | INTRAMUSCULAR | Status: AC
  Administered 2016-07-12: 10 mg via INTRAVENOUS

## 2016-07-12 MED ORDER — LIDOCAINE HCL (CARDIAC) 20 MG/ML IV SOLN
INTRAVENOUS | Status: DC | PRN
  Administered 2016-07-12: 100 mg via INTRATRACHEAL

## 2016-07-12 MED ORDER — PROMETHAZINE HCL 25 MG/ML IJ SOLN: 6.2500 mg | INTRAMUSCULAR | Status: DC | PRN

## 2016-07-12 MED ORDER — PROPOFOL 10 MG/ML IV BOLUS
INTRAVENOUS | Status: DC | PRN
  Administered 2016-07-12: 40 mg via INTRAVENOUS

## 2016-07-12 MED ORDER — PHENYLEPHRINE HCL 10 MG/ML IJ SOLN
10.0000 mg | INTRAVENOUS | Status: DC | PRN
  Administered 2016-07-12: 20 ug/min via INTRAVENOUS

## 2016-07-12 MED ORDER — MIDAZOLAM HCL 5 MG/ML IJ SOLN: 0.5000 mg | Freq: Once | INTRAMUSCULAR | Status: DC | PRN

## 2016-07-12 MED ORDER — PHENYLEPHRINE HCL 10 MG/ML IJ SOLN
INTRAMUSCULAR | Status: DC | PRN
  Administered 2016-07-12: 160 ug via INTRAVENOUS
  Administered 2016-07-12: 40 ug via INTRAVENOUS

## 2016-07-12 MED ORDER — PROPOFOL 500 MG/50ML IV EMUL
INTRAVENOUS | Status: DC | PRN
  Administered 2016-07-12: 125 ug/kg/min via INTRAVENOUS

## 2016-07-12 MED ORDER — MEPERIDINE HCL 100 MG/ML IJ SOLN: 6.2500 mg | INTRAMUSCULAR | Status: DC | PRN

## 2016-07-12 MED ORDER — BUTAMBEN-TETRACAINE-BENZOCAINE 2-2-14 % EX AERO
INHALATION_SPRAY | CUTANEOUS | Status: DC | PRN
  Administered 2016-07-12: 2 via TOPICAL

## 2016-07-12 MED ORDER — SODIUM CHLORIDE 0.9 % IV BOLUS (SEPSIS): 500.0000 mL | Freq: Once | INTRAVENOUS | Status: DC

## 2016-07-12 NOTE — Anesthesia Postprocedure Evaluation (Signed)
Anesthesia Post Note  Patient: Morgan [name]  Procedure(s) Performed: Procedure(s) (LRB): ESOPHAGOGASTRODUODENOSCOPY (EGD) WITH PROPOFOL (N/A) COLONOSCOPY (N/A) HOT HEMOSTASIS (ARGON PLASMA COAGULATION/BICAP) (N/A)  Patient location during evaluation: Endoscopy Anesthesia Type: MAC Level of consciousness: awake and alert, oriented and patient cooperative Pain management: pain level controlled Vital Signs Assessment: post-procedure vital signs reviewed and stable Respiratory status: spontaneous breathing, nonlabored ventilation, respiratory function stable and patient connected to nasal cannula oxygen (breathing back to baseline) Cardiovascular status: blood pressure returned to baseline and stable Postop Assessment: no signs of nausea or vomiting Anesthetic complications: no    Last Vitals:  Filed Vitals:   07/12/16 1600 07/12/16 1610  BP: 174/101 137/57  Pulse: 81 83  Temp:    Resp: 24 29    Last Pain:  Filed Vitals:   07/12/16 1621  PainSc: 0-No pain                 Nayquan Evinger,E. Dontrey Snellgrove

## 2016-07-12 NOTE — Op Note (Signed)
[facility] Patient Name: Morgan [name] Procedure Date : 07/12/2016 MRN: [id number] Attending MD: Ronald Lobo , MD Date of Birth: [birth year] CSN: [id number] Age: 81 Admit Type: Inpatient Procedure:                Colonoscopy Indications:              Hematochezia, Iron deficiency anemia secondary to                            chronic blood loss--past history of known proximal                            colonic AVMs, treated with APC in the past, most                            recently about a year ago, by Dr. Verta Ellen in                            [facility], now with recurrent                            bleeding and anemia. Has been through accumulative                            total of about 64 units of blood over the past 4                            years. Providers:                Ronald Lobo, MD, Alinda Deem, RN, Despina Pole                            Tech, Technician, Lance Coon, CRNA Referring MD:              Medicines:                Monitored Anesthesia Care Complications:            No immediate complications. Estimated Blood Loss:     Estimated blood loss was minimal. Procedure:                Pre-Anesthesia Assessment:                           - Prior to the procedure, a History and Physical                            was performed, and patient medications and                            allergies were reviewed. The risks and benefits of                            the procedure and the sedation options and risks  were discussed with the patient. All questions were                            answered, and informed consent was obtained. Prior                            Anticoagulants: The patient has taken no previous                            anticoagulant or antiplatelet agents. ASA Grade                            Assessment: III - A patient with severe systemic   disease. After reviewing the risks and benefits,                            the patient was deemed in satisfactory condition to                            undergo the procedure.                           After obtaining informed consent, the colonoscope                            was passed under direct vision. Throughout the                            procedure, the patient's blood pressure, pulse, and                            oxygen saturations were monitored continuously. The                            EC-[id number] ([id number]) scope was introduced through                            the anus and advanced to the the cecum, identified                            by appendiceal orifice and ileocecal valve. The                            colonoscopy was performed without difficulty. The                            patient tolerated the procedure well. The quality                            of the bowel preparation was excellent. The                            ileocecal valve, the appendiceal orifice and the  rectum were photographed. Scope In: Scope Out: Findings:      A 4 mm polyp was found in the proximal ascending colon. The polyp was       semi-sessile. Biopsies were taken with a cold forceps for histology.       Estimated blood loss was minimal.      Multiple small angiodysplastic lesions with bleeding on contact were       found in the cecum. Fulguration to ablate the lesion to prevent bleeding       by argon plasma was successful. Estimated blood loss: 2 mL.      The exam was otherwise normal throughout the examined colon.      There is no endoscopic evidence of diverticula, inflammation or mass in       the entire colon.      The retroflexed view of the distal rectum and anal verge was normal and       showed no anal or rectal abnormalities. Impression:               - One 4 mm polyp in the proximal ascending colon.                            Biopsied.                            - No blood in the colonic lumen at the start of                            this procedure, thus, this patient had stopped                            bleeding already the time of this procedure.                           - Multiple colonic angiodysplastic lesions. Treated                            with argon plasma coagulation (APC).                           - The distal rectum and anal verge are normal on                            retroflexion view. Moderate Sedation:      This patient was sedated with monitored anesthesia care, not moderate       sedation. Recommendation:           - Resume regular diet today.                           - Await pathology results. Consider elective                            definitive removal of this polyp in the future if                            it is adenomatous in character, although  realistically, at this patient's age, it poses very                            little threat to her unless high-grade dysplasia is                            present.                           - Repeat colonoscopy PRN for retreatment.                           - Continue present medications. Procedure Code(s):        --- Professional ---                           [phone], 59, Colonoscopy, flexible; with control of                            bleeding, any method                           45380, Colonoscopy, flexible; with biopsy, single                            or multiple Diagnosis Code(s):        --- Professional ---                           D12.2, Benign neoplasm of ascending colon                           K55.20, Angiodysplasia of colon without hemorrhage                           K92.1, Melena (includes Hematochezia)                           D50.0, Iron deficiency anemia secondary to blood                            loss (chronic) CPT copyright 2016 [employer]. All rights reserved. The codes  documented in this report are preliminary and upon coder review may  be revised to meet current compliance requirements. Ronald Lobo, MD 07/12/2016 3:51:27 PM This report has been signed electronically. Number of Addenda: 0

## 2016-07-12 NOTE — Anesthesia Procedure Notes (Signed)
Procedure Name: MAC Date/Time: 07/12/2016 2:32 PM Performed by: Lance Coon Pre-anesthesia Checklist: Patient identified, Emergency Drugs available, Suction available, Patient being monitored and Timeout performed Patient Re-evaluated:Patient Re-evaluated prior to inductionOxygen Delivery Method: Nasal cannula Intubation Type: IV induction

## 2016-07-12 NOTE — Anesthesia Preprocedure Evaluation (Addendum)
Anesthesia Evaluation  Patient identified by MRN, date of birth, ID band Patient awake    Reviewed: Allergy & Precautions, NPO status , Patient's Chart, lab work & pertinent test results  History of Anesthesia Complications Negative for: history of anesthetic complications  Airway Mallampati: I  TM Distance: >3 FB Neck ROM: Full    Dental  (+) Upper Dentures, Poor Dentition, Missing, Chipped, Dental Advisory Given   Pulmonary COPD, former smoker (quit 2014),       rales    Cardiovascular hypertension, Pt. on medications and Pt. on home beta blockers + CAD, + Cardiac Stents, +CHF, + Orthopnea, + PND and + DOE  + Valvular Problems/Murmurs AS  Rhythm:Regular Rate:Normal  '16 ECHO: EF 55-60%, mod Aortic stenosis, mod MR   Neuro/Psych negative neurological ROS     GI/Hepatic Neg liver ROS, GERD  Medicated and Controlled,  Endo/Other  diabetes (diet controlled now)Morbid obesity  Renal/GU Renal InsufficiencyRenal disease (creat 1.76)     Musculoskeletal   Abdominal (+) + obese,   Peds  Hematology  (+) Blood dyscrasia (Hb 8.1, plt 118K), anemia ,   Anesthesia Other Findings   Reproductive/Obstetrics                            Anesthesia Physical Anesthesia Plan  ASA: III  Anesthesia Plan: MAC   Post-op Pain Management:    Induction: Intravenous  Airway Management Planned: Natural Airway and Nasal Cannula  Additional Equipment:   Intra-op Plan:   Post-operative Plan:   Informed Consent: I have reviewed the patients History and Physical, chart, labs and discussed the procedure including the risks, benefits and alternatives for the proposed anesthesia with the patient or authorized representative who has indicated his/her understanding and acceptance.   Dental advisory given  Plan Discussed with: CRNA and Surgeon  Anesthesia Plan Comments: (Plan routine monitors, MAC )         Anesthesia Quick Evaluation

## 2016-07-12 NOTE — Progress Notes (Addendum)
Per previous Verbal orders Lisinopril to be given if BP over 829 systolic. Patient with blood pressure 111/59 in rt arm and 152/53 in left arm, Dr. Coralyn Pear made aware and stated to hold Lisinopril for now and to give it later this evening if BP higher, over 120. Patient also experiencing some dark blood when using bathroom, MD made aware and no new orders received will continue to monitor patient. Will report this to oncoming RN Jarmarcus Wambold, Bettina Gavia RN

## 2016-07-12 NOTE — Progress Notes (Signed)
Patient noted to have labored breathing, audible expiratory wheezing and reports of shortness of breath. O2 Sats 100% on room air RR 22-25. Dr. Glennon Mac notified, ordered received for Lasix '10mg'$  IV x1 dose now, this was given.  Dr. Glennon Mac will see patient in endo recovery before return to room.

## 2016-07-12 NOTE — Progress Notes (Signed)
Patient arrived from endoscopy received report at bedside from endoscopy RN, placed patient on monitor, CCMD made aware and vital signs obtained will monitor patient. Dashun Borre, Bettina Gavia RN

## 2016-07-12 NOTE — Progress Notes (Signed)
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 She has required multiple blood transfusions in the past. She presented as a transfer from [place] on 07/09/2016 for 2 day history of hematochezia. She was found to have a hemoglobin of 7.4 and transfused total of 2 units of packed red blood cells with repeat hemoglobin at 8.0. She continued to have bloody stools. An additional unit of blood was ordered. Meanwhile Dr. Cristina Gong of gastroenterology was consulted. Recurrent bleed from AVM was suspected. 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   Assessment & Plan:   Principal Problem:   Symptomatic anemia Active Problems:   Angiodysplasia   Chronic kidney disease (CKD), stage III (moderate)   Type II diabetes mellitus with neurological manifestations (HCC)   Benign hypertension   Iron deficiency anemia due to chronic blood loss   Thrombocytopenia (HCC)   Acute lower GI bleeding   Hyponatremia   AKI (acute kidney injury) ([place])   Pressure ulcer   1.  Suspected lower GI bleed secondary to arteriovenous malformations -Morgan [name] has a known history of arterial venous malformations, undergoing prior upper and lower endoscopies, and were treated with argon plasma coagulation. -She presents with painless hematochezia that started 2 days ago. -On 07/10/2016: Case was discussed with Dr. Cristina Gong of gastroenterology, recommending further proceed with upper and lower endoscopy as she continues to bleed. -On 07/12/2016 she underwent EGD/colonoscopy, found to have gastric and proximal colonic arteriovenous  malformations treated with argon plasma coagulator. Dr Cristina Gong noted that there was no blood in colonic lumen. Repeat lab work on this date showed stable hemoglobin of 8.1  2.  Acute blood loss anemia. -Likely secondary to GI bleeding from arteriovenous malformations -She has required multiple blood transfusions and past related to GI bleed -Overnight she was transfused 2 units packed blood cells with hemoglobin only trending up to 8.0, plan to transfuse a third unit of packed red blood cells today -On 07/11/2016 Repeat lab work showing hemoglobin of 8.0. GI bleed slowing down  -Repeat labs on 07/12/2016 showing stable hemoglobin of 8.1 with hematocrit of 26.  3.  Acute on chronic renal failure -She presents with creatinine of 2.3, having baseline creatinine between 1.5 and 2.0 -Likely secondary to hypovolemia from GI bleed -Receiving blood transfusion -ACE inhibitor stopped -On 07/12/2016 creatinine improving to 1.76 with BUN of 23.  4.  Hypertension. -Blood pressures stable -Continue metoprolol 50 mg by mouth daily. Lisinopril stopped due to acute on chronic renal failure -On 07/12/2016 her creatinine coming down to baseline, restart ACE inhibitor since blood pressures have been elevated.  DVT prophylaxis: SCDs Code Status: Full code Family Communication:  Disposition Plan: Anticipate discharge home in the next 24 hours  Consultants:   Gastroenterology  Procedures:   EGD performed on 07/12/2016 Impression: - Normal larynx.  - Normal esophagus.  - A few non-bleeding angiodysplastic lesions in the   stomach. Treated with argon plasma coagulation   (APC).  - Normal examined duodenum.  - No specimens collected.  Colonoscopy performed on 07/12/2016 Impression:- One 4 mm polyp in the proximal ascending colon.    Biopsied.  - No blood in the colonic lumen at the start of  this procedure, thus, this patient had stopped   bleeding already the time of this procedure.  - Multiple colonic angiodysplastic lesions. Treated   with argon plasma coagulation (APC).  - The distal rectum and anal verge are normal on   retroflexion view.   Subjective: States doing well, denies further episodes of GI bleed  Objective: Filed Vitals:   07/12/16 1540 07/12/16 1550 07/12/16 1600 07/12/16 1610  BP: 163/64 168/80 174/101 137/57  Pulse: 79 80 81 83  Temp:      TempSrc:      Resp: '26 24 24 29  '$ Height:      Weight:      SpO2: 100% 100% 100% 100%    Intake/Output Summary (Last 24 hours) at 07/12/16 1634 Last data filed at 07/12/16 1520  Gross per 24 hour  Intake    300 ml  Output      6 ml  Net    294 ml   Filed Weights   07/10/16 0425 07/11/16 0536 07/12/16 0601  Weight: 84.641 kg (186 lb 9.6 oz) 85.367 kg (188 lb 3.2 oz) 87 kg (191 lb 12.8 oz)    Examination:  General exam: Pale appearing, no acute distress, mentating well Respiratory system: Clear to auscultation. Respiratory effort normal. Cardiovascular system: S1 & S2 heard, RRR. No JVD, murmurs, rubs, gallops or clicks. 2+ bilateral extremity edema Gastrointestinal system: Abdomen is nondistended, soft and nontender. No organomegaly or masses felt. Normal bowel sounds heard. Central nervous system: Alert and oriented. No focal neurological deficits. Extremities: Symmetric 5 x 5 power. Skin: No rashes, lesions or ulcers Psychiatry: Judgement and insight appear normal. Mood & affect appropriate.     Data Reviewed: I have personally reviewed following labs and imaging studies  CBC:  Recent Labs Lab 07/09/16 1101 07/09/16 2359  07/10/16 0555 07/10/16 1630 07/11/16 0420 07/12/16 0445  WBC 5.4  --   --   --  4.3 4.0  HGB 7.4* 7.5* 8.0* 9.9* 8.0* 8.1*  HCT 22.9* 24.4* 25.1* 32.4* 25.3* 26.0*  MCV 88.5  --   --   --  89.4 90.0  PLT 133*  --   --   --  165 544*   Basic Metabolic Panel:  Recent Labs Lab 07/09/16 1101 07/10/16 0555 07/11/16 0420 07/12/16 0445  NA 133* 137 139 140  K 4.6 4.7 4.8 4.2  CL 98* 101 109 112*  CO2 '24 27 27 23  '$ GLUCOSE 205* 133* 136* 125*  BUN 40* 31* 30* 23*  CREATININE 2.34* 2.23* 2.12* 1.76*  CALCIUM 8.6* 8.6* 8.3* 8.3*   GFR: Estimated Creatinine Clearance: 27.5 mL/min (by C-G formula based on Cr of 1.76). Liver Function Tests:  Recent Labs Lab 07/09/16 1101  AST 24  ALT 17  ALKPHOS 79  BILITOT 1.0  PROT 6.5  ALBUMIN 3.4*   No results for input(s): LIPASE, AMYLASE in the last 168 hours. No results for input(s): AMMONIA in the last 168 hours. Coagulation Profile:  Recent Labs Lab 07/09/16 2359  INR 1.16   Cardiac Enzymes:  Recent Labs Lab 07/09/16 1414 07/09/16 2359 07/10/16 0555 07/10/16 1207  TROPONINI <0.03 0.04* 0.03* 0.09*   BNP (last 3 results) No results for input(s): PROBNP in the last 8760 hours. HbA1C:  Recent Labs  07/10/16 0555  HGBA1C 6.9*   CBG:  Recent Labs Lab 07/11/16 0603 07/11/16 1123 07/11/16 1608 07/11/16 2137 07/12/16 0609  GLUCAP 158* 228* 156* 145* 137*   Lipid Profile: No results for input(s): CHOL,  HDL, LDLCALC, TRIG, CHOLHDL, LDLDIRECT in the last 72 hours. Thyroid Function Tests: No results for input(s): TSH, T4TOTAL, FREET4, T3FREE, THYROIDAB in the last 72 hours. Anemia Panel: No results for input(s): VITAMINB12, FOLATE, FERRITIN, TIBC, IRON, RETICCTPCT in the last 72 hours. Sepsis Labs: No results for input(s): PROCALCITON, LATICACIDVEN in the last 168 hours.  No results found for this or any previous visit (from the past 240 hour(s)).       Radiology Studies: No results  found.      Scheduled Meds: . [MAR Hold] acetaminophen  500 mg Oral QHS  . [MAR Hold] insulin aspart  0-9 Units Subcutaneous TID WC  . [MAR Hold] metoprolol succinate  50 mg Oral Daily  . [MAR Hold] pantoprazole (PROTONIX) IV  40 mg Intravenous Q12H  . [MAR Hold] simvastatin  20 mg Oral QHS  . [MAR Hold] sodium chloride flush  3 mL Intravenous Q12H   Continuous Infusions: . sodium chloride 20 mL/hr at 07/12/16 0354     LOS: 3 days    Time spent: 35 min    Morgan Cellar, MD [facility] Pager [phone]  If 7PM-7AM, please contact night-coverage [email or web address] Password [facility] 07/12/2016, 4:34 PM

## 2016-07-12 NOTE — Progress Notes (Signed)
Patient's EGD and colonoscopy were well tolerated.  She did have gastric and proximal colonic AVMs as previously noted, which I treated with the argon plasma coagulator. Notably, there was no blood in the colonic lumen at the start of the procedure, indicating that the patient had already spontaneously stopped bleeding prior to this procedure.  I have ordered a diet for the patient.  Discharge tomorrow might be appropriate if she remains stable overnight.  Cleotis Nipper, M.D. Pager [phone] If no answer or after 5 PM call [phone]

## 2016-07-12 NOTE — Transfer of Care (Signed)
Immediate Anesthesia Transfer of Care Note  Patient: MAYSUN MEDITZ  Procedure(s) Performed: Procedure(s): ESOPHAGOGASTRODUODENOSCOPY (EGD) WITH PROPOFOL (N/A) COLONOSCOPY (N/A) HOT HEMOSTASIS (ARGON PLASMA COAGULATION/BICAP) (N/A)  Patient Location: Endoscopy Unit  Anesthesia Type:MAC  Level of Consciousness: awake and patient cooperative  Airway & Oxygen Therapy: Patient Spontanous Breathing and Patient connected to nasal cannula oxygen  Post-op Assessment: Report given to RN and Post -op Vital signs reviewed and stable  Post vital signs: Reviewed and stable  Last Vitals:  Filed Vitals:   07/12/16 1025 07/12/16 1128  BP: 166/63 196/87  Pulse: 96 88  Temp:  36.9 C  Resp:  27    Last Pain:  Filed Vitals:   07/12/16 1129  PainSc: 0-No pain         Complications: No apparent anesthesia complications

## 2016-07-12 NOTE — Op Note (Signed)
[facility] Patient Name: Morgan [name] Procedure Date : 07/12/2016 MRN: [id number] Attending MD: Ronald Lobo , MD Date of Birth: Jul 18, 1935 CSN: [id number] Age: 81 Admit Type: Inpatient Procedure:                Upper GI endoscopy Indications:              Iron deficiency anemia secondary to chronic blood                            loss, Hematochezia; Patient has known history of                            gastric AVMs treated with argon plasma coagulation                            therapy approximately 2 years ago by Dr. Verta Ellen in [place], [place]; has received                            approximately 64 units of blood over the past 4                            years Providers:                Ronald Lobo, MD, Malka So, RN, Despina Pole Tech, Technician, Lance Coon, CRNA Referring MD:              Medicines:                Monitored Anesthesia Care Complications:            No immediate complications. Estimated Blood Loss:     Estimated blood loss: none. Estimated blood loss:                            none. Procedure:                Pre-Anesthesia Assessment:                           - Prior to the procedure, a History and Physical                            was performed, and patient medications and                            allergies were reviewed. The risks and benefits of                            the procedure and the sedation options and risks  were discussed with the patient. All questions were                            answered, and informed consent was obtained. Prior                            Anticoagulants: The patient has taken no previous                            anticoagulant or antiplatelet agents. ASA Grade                            Assessment: III - A patient with severe systemic                            disease. After reviewing the risks  and benefits,                            the patient was deemed in satisfactory condition to                            undergo the procedure. she was brought from her                            hospital room to the [facility] cone endoscopy unit.                           After obtaining informed consent, the endoscope was                            passed under direct vision. Throughout the                            procedure, the patient's blood pressure, pulse, and                            oxygen saturations were monitored continuously. The                            [id number]-[id number] ([id number]) scope was introduced through the                            mouth, and advanced to the second part of duodenum.                            The upper GI endoscopy was accomplished without                            difficulty. The patient tolerated the procedure                            well. Scope In: Scope Out: Findings:      The larynx was normal.  The examined esophagus was normal.      A few small non-bleeding angiodysplastic lesions were found in the       gastric body, on the greater curvature aspect of the posterior wall of       the stomach. Fulguration to ablate the lesion to prevent bleeding by       argon plasma was successful. Estimated blood loss: none.      No other significant abnormalities were identified in a careful       examination of the stomach.      The cardia and gastric fundus were normal on retroflexion.      The examined duodenum was normal. Impression:               - Normal larynx.                           - Normal esophagus.                           - A few non-bleeding angiodysplastic lesions in the                            stomach. Treated with argon plasma coagulation                            (APC).                           - Normal examined duodenum.                           - No specimens collected. Moderate Sedation:      This patient was sedated  with monitored anesthesia care, not moderate       sedation. Recommendation:           - Observe patient's clinical course following                            today's procedure with therapeutic intervention.                           - Perform a colonoscopy today.                           - Resume regular diet today.                           - Continue present medications. Procedure Code(s):        --- Professional ---                           [phone], Esophagogastroduodenoscopy, flexible,                            transoral; with control of bleeding, any method Diagnosis Code(s):        --- Professional ---                           P32.951, Angiodysplasia  of stomach and duodenum                            without bleeding                           D50.0, Iron deficiency anemia secondary to blood                            loss (chronic)                           K92.1, Melena (includes Hematochezia) CPT copyright 2016 [employer]. All rights reserved. The codes documented in this report are preliminary and upon coder review may  be revised to meet current compliance requirements. Ronald Lobo, MD 07/12/2016 3:44:14 PM This report has been signed electronically. Number of Addenda: 0

## 2016-07-13 ENCOUNTER — Encounter (HOSPITAL_COMMUNITY): Payer: Self-pay | Admitting: Gastroenterology

## 2016-07-13 LAB — GLUCOSE, CAPILLARY: GLUCOSE-CAPILLARY: 140 mg/dL — AB (ref 65–99)

## 2016-07-13 LAB — URINE MICROSCOPIC-ADD ON

## 2016-07-13 LAB — BASIC METABOLIC PANEL
Anion gap: 7 (ref 5–15)
BUN: 17 mg/dL (ref 6–20)
CHLORIDE: 108 mmol/L (ref 101–111)
CO2: 24 mmol/L (ref 22–32)
CREATININE: 1.73 mg/dL — AB (ref 0.44–1.00)
Calcium: 8.6 mg/dL — ABNORMAL LOW (ref 8.9–10.3)
GFR calc Af Amer: 31 mL/min — ABNORMAL LOW (ref >60)
GFR calc non Af Amer: 27 mL/min — ABNORMAL LOW (ref >60)
Glucose, Bld: 223 mg/dL — ABNORMAL HIGH (ref 65–99)
Potassium: 4.5 mmol/L (ref 3.5–5.1)
Sodium: 139 mmol/L (ref 135–145)

## 2016-07-13 LAB — URINALYSIS, ROUTINE W REFLEX MICROSCOPIC
BILIRUBIN URINE: NEGATIVE
Glucose, UA: NEGATIVE mg/dL
Ketones, ur: NEGATIVE mg/dL
Nitrite: POSITIVE — AB
Protein, ur: 30 mg/dL — AB
SPECIFIC GRAVITY, URINE: 1.014 (ref 1.005–1.030)
pH: 5 (ref 5.0–8.0)

## 2016-07-13 LAB — CBC
HEMATOCRIT: 30.6 % — AB (ref 36.0–46.0)
HEMOGLOBIN: 9.2 g/dL — AB (ref 12.0–15.0)
MCH: 28 pg (ref 26.0–34.0)
MCHC: 30.1 g/dL (ref 30.0–36.0)
MCV: 93 fL (ref 78.0–100.0)
Platelets: 117 10*3/uL — ABNORMAL LOW (ref 150–400)
RBC: 3.29 MIL/uL — ABNORMAL LOW (ref 3.87–5.11)
RDW: 18.7 % — ABNORMAL HIGH (ref 11.5–15.5)
WBC: 12.5 10*3/uL — ABNORMAL HIGH (ref 4.0–10.5)

## 2016-07-13 MED ORDER — DEXTROSE 5 % IV SOLN
1.0000 g | Freq: Every day | INTRAVENOUS | Status: DC
  Administered 2016-07-13: 1 g via INTRAVENOUS
  Filled 2016-07-13: qty 10

## 2016-07-13 MED ORDER — CIPROFLOXACIN HCL 500 MG PO TABS: 500.0000 mg | ORAL_TABLET | Freq: Two times a day (BID) | ORAL | Status: DC

## 2016-07-13 MED ORDER — HEPARIN SOD (PORK) LOCK FLUSH 100 UNIT/ML IV SOLN
500.0000 [IU] | INTRAVENOUS | Status: AC | PRN
  Administered 2016-07-13: 500 [IU]

## 2016-07-13 NOTE — Progress Notes (Signed)
Blood in the urine when she voided. MD contacted, new order for urinalysis received. Will continue to monitor.

## 2016-07-13 NOTE — Progress Notes (Signed)
Subjective: One small bout of scant hematochezia last night. No abdominal pain. Tolerating current diet.  Objective: Vital signs in last 24 hours: Temp:  [97.5 F (36.4 C)-98.2 F (36.8 C)] 98.2 F (36.8 C) (07/22 0452) Pulse Rate:  [62-91] 87 (07/22 1006) Resp:  [20-30] 20 (07/22 0452) BP: (106-174)/(53-101) 167/80 mmHg (07/22 1006) SpO2:  [100 %] 100 % (07/22 0452) Weight:  [85 kg (187 lb 6.3 oz)] 85 kg (187 lb 6.3 oz) (07/22 0500) Weight change: -2 kg (-4 lb 6.5 oz) Last BM Date: 07/12/16  PE: GEN:  Elderly, overweight, pleasant, NAD ABD:  Soft, protuberant, non-tender  Lab Results: CBC    Component Value Date/Time   WBC 12.5* 07/13/2016 0949   WBC 7.0 01/18/2015 0545   RBC 3.29* 07/13/2016 0949   RBC 3.17* 01/18/2015 0545   HGB 9.2* 07/13/2016 0949   HGB 10.2* 04/19/2015 0954   HCT 30.6* 07/13/2016 0949   HCT 29.9* 01/18/2015 0545   PLT 117* 07/13/2016 0949   PLT 135* 01/18/2015 0545   MCV 93.0 07/13/2016 0949   MCV 94 01/18/2015 0545   MCH 28.0 07/13/2016 0949   MCH 31.9 01/18/2015 0545   MCHC 30.1 07/13/2016 0949   MCHC 33.9 01/18/2015 0545   RDW 18.7* 07/13/2016 0949   RDW 16.4* 01/18/2015 0545   LYMPHSABS 0.5* 06/24/2016 1001   LYMPHSABS 0.6* 01/18/2015 0545   MONOABS 0.4 06/24/2016 1001   MONOABS 0.6 01/18/2015 0545   EOSABS 0.0 06/24/2016 1001   EOSABS 0.1 01/18/2015 0545   BASOSABS 0.1 06/24/2016 1001   BASOSABS 0.1 01/18/2015 0545   BASOSABS 0 06/04/2013 1140   BASOSABS 1 02/17/2012 1243   CMP     Component Value Date/Time   NA 139 07/13/2016 0949   NA 139 01/18/2015 0545   K 4.5 07/13/2016 0949   K 3.9 01/18/2015 0545   CL 108 07/13/2016 0949   CL 105 01/18/2015 0545   CO2 24 07/13/2016 0949   CO2 25 01/18/2015 0545   GLUCOSE 223* 07/13/2016 0949   GLUCOSE 179* 01/18/2015 0545   BUN 17 07/13/2016 0949   BUN 29* 01/18/2015 0545   CREATININE 1.73* 07/13/2016 0949   CREATININE 1.80* 01/18/2015 0545   CALCIUM 8.6* 07/13/2016 0949   CALCIUM 8.7 01/18/2015 0545   PROT 6.5 07/09/2016 1101   PROT 6.6 06/20/2014 1101   ALBUMIN 3.4* 07/09/2016 1101   ALBUMIN 3.1* 06/20/2014 1101   AST 24 07/09/2016 1101   AST 32 06/20/2014 1101   ALT 17 07/09/2016 1101   ALT 31 06/20/2014 1101   ALKPHOS 79 07/09/2016 1101   ALKPHOS 125* 06/20/2014 1101   BILITOT 1.0 07/09/2016 1101   BILITOT 0.3 06/20/2014 1101   GFRNONAA 27* 07/13/2016 0949   GFRNONAA 29* 01/18/2015 0545   GFRNONAA 28* 06/20/2014 1101   GFRAA 31* 07/13/2016 0949   GFRAA 35* 01/18/2015 0545   GFRAA 33* 06/20/2014 1101   Assessment:  1.  Recurrent anemia.  Hgb improved to 9.2 today.  Likely at least in part due to diffuse AVMs.  Endoscopy and colonoscopy with APC performed yesterday. 2.  Intermittent hematochezia, resolved.  Plan:  1.  Follow CBC; she has appointment with her hematologist on Monday. 2.  Avoid/minimize NSAIDs and anticoagulants, as clinically feasible. 3.  Advance diet as tolerated. 4.  Patient tells me that plan is in place for discharge today; that's fine with me as long as she has close outpatient follow-up (and it sounds like she does, will office visit with  her hematologist on Monday July 24th. 5.  Will sign-off; please call with questions; patient can follow-up with her gastroenterologist, Dr. Gaylyn Cheers in [place].  Thanks for the consultation.   Landry Dyke 07/13/2016, 11:34 AM   Pager [phone] If no answer or after 5 PM call [phone]

## 2016-07-13 NOTE — Discharge Summary (Addendum)
Physician Discharge Summary  JULIAH [name] JIR:[id number] DOB: 12-18-1935 DOA: 07/09/2016  PCP: Kirk Ruths., MD  Admit date: 07/09/2016 Discharge date: 07/13/2016  Time spent: 35 minutes  Recommendations for Outpatient Follow-up:  1. Morgan [name] Was admitted for GI bleed likely secondary to AVMs, she was transfused with a total of 3 units of packed red blood cells during this hospitalization, on day of discharge had a stable hemoglobin of 9.2 2. On discharge she was instructed to follow-up with her GI physician   Discharge Diagnoses:  Principal Problem:   Symptomatic anemia Active Problems:   Angiodysplasia   Chronic kidney disease (CKD), stage III (moderate)   Benign hypertension   Iron deficiency anemia due to chronic blood loss   Thrombocytopenia (HCC)   Acute lower GI bleeding   Hyponatremia   AKI (acute kidney injury) ([place])   Discharge Condition: Stable  Diet recommendation: Heart healthy  Filed Weights   07/11/16 0536 07/12/16 0601 07/13/16 0500  Weight: 85.367 kg (188 lb 3.2 oz) 87 kg (191 lb 12.8 oz) 85 kg (187 lb 6.3 oz)    History of present illness:  Morgan [name] is a 81 y.o. female with medical history significant for recurrent GI bleeds attributed to angiodysplasia, chronic iron deficiency anemia managed with Procrit and iron infusions, hypertension, type 2 diabetes mellitus, and hyperlipidemia who presented to [facility] for evaluation of bloody stools. Patient reports pain in her usual state of health until the insidious development of dyspnea and generalized weakness several days ago which patient reports is a common occurrence for her when her hemoglobin has dropped. She is followed by outpatient oncology for Procrit and iron infusions and received Procrit on 07/08/2016, and last iron infusion on 07/04/2016. Hemoglobin was noted to be 7.5 at that time, down from 8.8 earlier in the month. Patient was woken up from her sleep this morning at  approximately 4 AM with sudden need to move her bowels. She reports the bowel movement as liquid, thinking it was watery into she noted bright red blood in the toilet. There was no associated abdominal pain or nausea with this. She's had no epigastric discomfort or vomiting. She had a second bloody bowel movement, this one formed, later in the day and called her oncologist clinic for recommendations. She was advised to seek further care in the emergency department.  Hospital Course:  Morgan [name] is a 81 year old female with a history of recurrent GI bleed secondary to arteriovenous malformations undergoing previous upper and lower endoscopies. She has required multiple blood transfusions in the past. She presented as a transfer from [place] on 07/09/2016 for 2 day history of hematochezia. She was found to have a hemoglobin of 7.4 and transfused total of 2 units of packed red blood cells with repeat hemoglobin at 8.0. She continued to have bloody stools. An additional unit of blood was ordered. Meanwhile Dr. Cristina Gong of gastroenterology was consulted. Recurrent bleed from AVM was suspected. 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  1. Suspected lower GI bleed secondary to arteriovenous malformations -Morgan Gerstel has a known history of arterial venous malformations, undergoing prior upper and lower endoscopies, and were treated with argon plasma coagulation. -She presents with painless hematochezia that started 2 days ago. -On 07/10/2016: Case was discussed with Dr. Cristina Gong of gastroenterology, recommending further proceed with upper and lower endoscopy as she continues to bleed. -On 07/12/2016 she  underwent EGD/colonoscopy, found to have gastric and proximal colonic arteriovenous malformations treated with argon plasma coagulator. Dr Cristina Gong noted that  there was no blood in colonic lumen. Repeat lab work on this date showed stable hemoglobin of 8.1 -She was monitored for the next 24 hours and repeat lab work on 07/13/2016 revealed a stable hemoglobin of 9.2  2. Acute blood loss anemia. -Likely secondary to GI bleeding from arteriovenous malformations -She has required multiple blood transfusions and past related to GI bleed -During this hospitalization she was transfused with a total of 3 units of packed red blood cells, by day of discharge she had a stable hemoglobin of 9.2  3. Acute on chronic renal failure -She presents with creatinine of 2.3, having baseline creatinine between 1.5 and 2.0 -Likely secondary to hypovolemia from GI bleed -Receiving blood transfusion -ACE inhibitor stopped -On 07/13/2016 creatinine improving to 1.76 with BUN of 17  4. Hypertension. -Blood pressures stable -Continue metoprolol 50 mg by mouth daily. Lisinopril stopped due to acute on chronic renal failure -On 07/12/2016 her creatinine coming down to baseline, restart ACE inhibitor since blood pressures have been elevated.  Procedures:  EGD performed on 07/12/2016 Impression: - Normal larynx.  - Normal esophagus.  - A few non-bleeding angiodysplastic lesions in the   stomach. Treated with argon plasma coagulation   (APC).  - Normal examined duodenum.  - No specimens collected.  Colonoscopy performed on 07/12/2016 Impression:- One 4 mm polyp in the proximal ascending colon.   Biopsied.  - No blood in the colonic lumen at the start of   this procedure, thus, this patient had stopped   bleeding already the time of this procedure.  - Multiple colonic  angiodysplastic lesions. Treated   with argon plasma coagulation (APC).  - The distal rectum and anal verge are normal on   retroflexion view.  Consultations:  Gastroenterology  Discharge Exam: Filed Vitals:   07/13/16 0452 07/13/16 1006  BP: 170/70 167/80  Pulse: 91 87  Temp: 98.2 F (36.8 C)   Resp: 20    General exam: Pale appearing, no acute distress, mentating well Respiratory system: Clear to auscultation. Respiratory effort normal. Cardiovascular system: S1 & S2 heard, RRR. No JVD, murmurs, rubs, gallops or clicks. 2+ bilateral extremity edema Gastrointestinal system: Abdomen is nondistended, soft and nontender. No organomegaly or masses felt. Normal bowel sounds heard. Central nervous system: Alert and oriented. No focal neurological deficits. Extremities: Symmetric 5 x 5 power. Skin: No rashes, lesions or ulcers Psychiatry: Judgement and insight appear normal. Mood & affect appropriate.   Discharge Instructions   Discharge Instructions    (HEART FAILURE PATIENTS) Call MD:  Anytime you have any of the following symptoms: 1) 3 pound weight gain in 24 hours or 5 pounds in 1 week 2) shortness of breath, with or without a dry hacking cough 3) swelling in the hands, feet or stomach 4) if you have to sleep on extra pillows at night in order to breathe.    Complete by:  As directed      Call MD for:  difficulty breathing, headache or visual disturbances    Complete by:  As directed      Call MD for:  extreme fatigue    Complete by:  As directed      Call MD for:  hives    Complete by:  As directed      Call MD for:  persistant dizziness or light-headedness    Complete by:  As directed  Call MD for:  persistant nausea and vomiting    Complete by:  As directed      Call MD for:  redness, tenderness, or signs of infection (pain, swelling, redness, odor or green/yellow discharge around incision  site)    Complete by:  As directed      Call MD for:  severe uncontrolled pain    Complete by:  As directed      Call MD for:  temperature >100.4    Complete by:  As directed      Call MD for:    Complete by:  As directed      Diet - low sodium heart healthy    Complete by:  As directed      Increase activity slowly    Complete by:  As directed           Current Discharge Medication List    START taking these medications   Details  ciprofloxacin (CIPRO) 500 MG tablet Take 1 tablet (500 mg total) by mouth 2 (two) times daily. Qty: 10 tablet, Refills: 0      CONTINUE these medications which have NOT CHANGED   Details  acetaminophen (TYLENOL) 500 MG tablet Take 500 mg by mouth at bedtime.    lisinopril (PRINIVIL,ZESTRIL) 40 MG tablet Take 40 mg by mouth daily.    metoprolol succinate (TOPROL-XL) 50 MG 24 hr tablet Take 50 mg by mouth daily.    Associated Diagnoses: Chronic kidney disease (CKD), stage III (moderate); IDA (iron deficiency anemia)    pantoprazole (PROTONIX) 40 MG tablet Take 40 mg by mouth daily.    simvastatin (ZOCOR) 20 MG tablet Take 20 mg by mouth at bedtime.     torsemide (DEMADEX) 20 MG tablet Take 1 tablet by mouth 4 (four) times daily.    Associated Diagnoses: Chronic kidney disease (CKD), stage III (moderate); IDA (iron deficiency anemia)       Allergies  Allergen Reactions  . Aspirin Other (See Comments)    bleeding  . Atenolol Other (See Comments)    fatigue  . Clopidogrel Bisulfate Other (See Comments)    bleeding  . Darvon [Propoxyphene] Hives and Other (See Comments)  . Hydrochlorothiazide Other (See Comments)    Fatigue and cramps  . Iodinated Diagnostic Agents Other (See Comments)    Questionable GI bleed  . Nylon Hives  . Other Other (See Comments)    Nylon sutures  . Pravastatin Other (See Comments)    Severe cramping   Follow-up Information    Follow up with Kirk Ruths., MD In 1 week.   Specialty:  Internal Medicine    Contact information:   [place] [address] [phone]       Follow up with Gaylyn Cheers, MD In 1 week.   Specialty:  Gastroenterology   Contact information:   [address] [address] [phone]        The results of significant diagnostics from this hospitalization (including imaging, microbiology, ancillary and laboratory) are listed below for reference.    Significant Diagnostic Studies: No results found.  Microbiology: No results found for this or any previous visit (from the past 240 hour(s)).   Labs: Basic Metabolic Panel:  Recent Labs Lab 07/09/16 1101 07/10/16 0555 07/11/16 0420 07/12/16 0445 07/13/16 0949  NA 133* 137 139 140 139  K 4.6 4.7 4.8 4.2 4.5  CL 98* 101 109 112* 108  CO2 '24 27 27 23 24  '$ GLUCOSE 205* 133* 136* 125* 223*  BUN 40* 31* 30* 23* 17  CREATININE 2.34* 2.23* 2.12* 1.76* 1.73*  CALCIUM 8.6* 8.6* 8.3* 8.3* 8.6*   Liver Function Tests:  Recent Labs Lab 07/09/16 1101  AST 24  ALT 17  ALKPHOS 79  BILITOT 1.0  PROT 6.5  ALBUMIN 3.4*   No results for input(s): LIPASE, AMYLASE in the last 168 hours. No results for input(s): AMMONIA in the last 168 hours. CBC:  Recent Labs Lab 07/09/16 1101  07/10/16 0555 07/10/16 1630 07/11/16 0420 07/12/16 0445 07/13/16 0949  WBC 5.4  --   --   --  4.3 4.0 12.5*  HGB 7.4*  < > 8.0* 9.9* 8.0* 8.1* 9.2*  HCT 22.9*  < > 25.1* 32.4* 25.3* 26.0* 30.6*  MCV 88.5  --   --   --  89.4 90.0 93.0  PLT 133*  --   --   --  165 118* 117*  < > = values in this interval not displayed. Cardiac Enzymes:  Recent Labs Lab 07/09/16 1414 07/09/16 2359 07/10/16 0555 07/10/16 1207  TROPONINI <0.03 0.04* 0.03* 0.09*   BNP: BNP (last 3 results) No results for input(s): BNP in the last 8760 hours.  ProBNP (last 3 results) No results for input(s): PROBNP in the last 8760  hours.  CBG:  Recent Labs Lab 07/11/16 2137 07/12/16 0609 07/12/16 1724 07/12/16 2154 07/13/16 0608  GLUCAP 145* 137* 169* 144* 140*       Signed:  Kelvin Cellar MD.  [facility] 07/13/2016, 11:18 AM

## 2016-07-15 ENCOUNTER — Inpatient Hospital Stay: Payer: Medicare Other

## 2016-07-15 DIAGNOSIS — Z8541 Personal history of malignant neoplasm of cervix uteri: Secondary | ICD-10-CM | POA: Diagnosis not present

## 2016-07-15 DIAGNOSIS — E1122 Type 2 diabetes mellitus with diabetic chronic kidney disease: Secondary | ICD-10-CM | POA: Diagnosis not present

## 2016-07-15 DIAGNOSIS — N184 Chronic kidney disease, stage 4 (severe): Secondary | ICD-10-CM | POA: Diagnosis not present

## 2016-07-15 DIAGNOSIS — J449 Chronic obstructive pulmonary disease, unspecified: Secondary | ICD-10-CM | POA: Diagnosis not present

## 2016-07-15 DIAGNOSIS — D500 Iron deficiency anemia secondary to blood loss (chronic): Secondary | ICD-10-CM

## 2016-07-15 DIAGNOSIS — M1990 Unspecified osteoarthritis, unspecified site: Secondary | ICD-10-CM | POA: Diagnosis not present

## 2016-07-15 DIAGNOSIS — K219 Gastro-esophageal reflux disease without esophagitis: Secondary | ICD-10-CM | POA: Diagnosis not present

## 2016-07-15 DIAGNOSIS — K589 Irritable bowel syndrome without diarrhea: Secondary | ICD-10-CM | POA: Diagnosis not present

## 2016-07-15 DIAGNOSIS — I350 Nonrheumatic aortic (valve) stenosis: Secondary | ICD-10-CM | POA: Diagnosis not present

## 2016-07-15 DIAGNOSIS — N183 Chronic kidney disease, stage 3 (moderate): Secondary | ICD-10-CM

## 2016-07-15 DIAGNOSIS — M109 Gout, unspecified: Secondary | ICD-10-CM | POA: Diagnosis not present

## 2016-07-15 DIAGNOSIS — D631 Anemia in chronic kidney disease: Secondary | ICD-10-CM

## 2016-07-15 DIAGNOSIS — I739 Peripheral vascular disease, unspecified: Secondary | ICD-10-CM | POA: Diagnosis not present

## 2016-07-15 DIAGNOSIS — E785 Hyperlipidemia, unspecified: Secondary | ICD-10-CM | POA: Diagnosis not present

## 2016-07-15 DIAGNOSIS — Z87891 Personal history of nicotine dependence: Secondary | ICD-10-CM | POA: Diagnosis not present

## 2016-07-15 DIAGNOSIS — Z79899 Other long term (current) drug therapy: Secondary | ICD-10-CM | POA: Diagnosis not present

## 2016-07-15 DIAGNOSIS — I429 Cardiomyopathy, unspecified: Secondary | ICD-10-CM | POA: Diagnosis not present

## 2016-07-15 DIAGNOSIS — I509 Heart failure, unspecified: Secondary | ICD-10-CM | POA: Diagnosis not present

## 2016-07-15 DIAGNOSIS — I130 Hypertensive heart and chronic kidney disease with heart failure and stage 1 through stage 4 chronic kidney disease, or unspecified chronic kidney disease: Secondary | ICD-10-CM | POA: Diagnosis present

## 2016-07-15 DIAGNOSIS — N189 Chronic kidney disease, unspecified: Principal | ICD-10-CM

## 2016-07-15 DIAGNOSIS — I2510 Atherosclerotic heart disease of native coronary artery without angina pectoris: Secondary | ICD-10-CM | POA: Diagnosis not present

## 2016-07-15 LAB — SAMPLE TO BLOOD BANK

## 2016-07-15 LAB — URINE CULTURE: Culture: >=100000 — AB

## 2016-07-15 LAB — HEMOGLOBIN: Hemoglobin: 9.4 g/dL — ABNORMAL LOW (ref 12.0–16.0)

## 2016-07-15 LAB — HEMATOCRIT: HCT: 28.8 % — ABNORMAL LOW (ref 35.0–47.0)

## 2016-07-15 MED ORDER — EPOETIN ALFA 20000 UNIT/ML IJ SOLN: 20000.0000 [IU] | Freq: Once | INTRAMUSCULAR | Status: AC

## 2016-07-15 NOTE — Progress Notes (Signed)
While bringing patient back to infusion for Procrit injections she states that while in the hospital over the weekend for active GI bleed and blood transfusion the MDs there recommend she NOT receive Procrit injections.  I spoke with Dr. Tish Men and he wants patient to schedule a f/u appt with him next week she is also to keep the iron infusion that is scheduled for tomorrow.

## 2016-07-16 ENCOUNTER — Inpatient Hospital Stay: Payer: Medicare Other

## 2016-07-16 VITALS — BP 151/57 | HR 61 | Temp 98.4°F | Resp 18

## 2016-07-16 DIAGNOSIS — D500 Iron deficiency anemia secondary to blood loss (chronic): Secondary | ICD-10-CM

## 2016-07-16 DIAGNOSIS — I130 Hypertensive heart and chronic kidney disease with heart failure and stage 1 through stage 4 chronic kidney disease, or unspecified chronic kidney disease: Secondary | ICD-10-CM | POA: Diagnosis not present

## 2016-07-16 DIAGNOSIS — N189 Chronic kidney disease, unspecified: Secondary | ICD-10-CM

## 2016-07-16 DIAGNOSIS — D631 Anemia in chronic kidney disease: Secondary | ICD-10-CM

## 2016-07-16 MED ORDER — SODIUM CHLORIDE 0.9 % IV SOLN
510.0000 mg | Freq: Once | INTRAVENOUS | Status: AC
  Administered 2016-07-16: 510 mg via INTRAVENOUS
  Filled 2016-07-16: qty 17

## 2016-07-16 MED ORDER — HEPARIN SOD (PORK) LOCK FLUSH 100 UNIT/ML IV SOLN
INTRAVENOUS | Status: AC
  Filled 2016-07-16: qty 5

## 2016-07-16 MED ORDER — SODIUM CHLORIDE 0.9 % IJ SOLN
10.0000 mL | INTRAMUSCULAR | Status: DC | PRN
  Administered 2016-07-16: 10 mL
  Filled 2016-07-16: qty 10

## 2016-07-16 MED ORDER — SODIUM CHLORIDE 0.9 % IV SOLN
Freq: Once | INTRAVENOUS | Status: AC
  Administered 2016-07-16: 14:00:00 via INTRAVENOUS
  Filled 2016-07-16: qty 1000

## 2016-07-16 MED ORDER — HEPARIN SOD (PORK) LOCK FLUSH 100 UNIT/ML IV SOLN
500.0000 [IU] | Freq: Once | INTRAVENOUS | Status: AC | PRN
  Administered 2016-07-16: 500 [IU]

## 2016-07-22 ENCOUNTER — Inpatient Hospital Stay: Payer: Medicare Other

## 2016-07-22 ENCOUNTER — Inpatient Hospital Stay (HOSPITAL_BASED_OUTPATIENT_CLINIC_OR_DEPARTMENT_OTHER): Payer: Medicare Other | Admitting: Internal Medicine

## 2016-07-22 VITALS — BP 167/65 | HR 64 | Temp 96.8°F | Resp 18 | Wt 196.2 lb

## 2016-07-22 DIAGNOSIS — N189 Chronic kidney disease, unspecified: Principal | ICD-10-CM

## 2016-07-22 DIAGNOSIS — N183 Chronic kidney disease, stage 3 unspecified: Secondary | ICD-10-CM

## 2016-07-22 DIAGNOSIS — D631 Anemia in chronic kidney disease: Secondary | ICD-10-CM

## 2016-07-22 DIAGNOSIS — Z79899 Other long term (current) drug therapy: Secondary | ICD-10-CM

## 2016-07-22 DIAGNOSIS — N184 Chronic kidney disease, stage 4 (severe): Secondary | ICD-10-CM

## 2016-07-22 DIAGNOSIS — D509 Iron deficiency anemia, unspecified: Secondary | ICD-10-CM

## 2016-07-22 DIAGNOSIS — I130 Hypertensive heart and chronic kidney disease with heart failure and stage 1 through stage 4 chronic kidney disease, or unspecified chronic kidney disease: Secondary | ICD-10-CM | POA: Diagnosis not present

## 2016-07-22 LAB — SAMPLE TO BLOOD BANK

## 2016-07-22 LAB — HEMATOCRIT: HCT: 26.9 % — ABNORMAL LOW (ref 35.0–47.0)

## 2016-07-22 LAB — HEMOGLOBIN: Hemoglobin: 9 g/dL — ABNORMAL LOW (ref 12.0–16.0)

## 2016-07-22 NOTE — Progress Notes (Signed)
Patient states she was d/c from hospital one week ago for GI bleed.  States she had one bloody stool yesterday.

## 2016-07-22 NOTE — Assessment & Plan Note (Addendum)
#   CHRONIC ANEMIA-chronic kidney disease/ secondary to GI bleed/AV malformations; iron deficiency. S/p 3 units PRBC/ GI colo-egd in July 2017 [Dr.Buccini]- AV malformations.   # pt declines Procrit/ ? Bloody diarrhea. STOP procrit.    # Chronic kidney disease- creatinine-  ~2/ weight gain- follow up with PCP. Follows up this week.   # Check H&H every week; ferrahem IV iron monthly. Follow-up with me in 3 months [facility] BMP/Iron studies/ Ferritin.

## 2016-07-22 NOTE — Progress Notes (Signed)
[facility] OFFICE PROGRESS NOTE  Patient Care Team: Kirk Ruths, MD as PCP - General (Internal Medicine) Cammie Sickle, MD as Consulting Physician (Hematology and Oncology)   SUMMARY OF HEMATOLOGIC HISTORY:  # CHRONIC ANEMIA sec Hx GIB/CKD [ CKD STAGE III]; Jan-2016 [colo/EGD- Dr.Elliot]; capsule x2 - angiodysplasia; BMBx- 1999; IV iron & procrit; July 2017- colo- AVMs in cecum & stomach [s/p [place]on; Dr.Buccini; [place]]; Aug 2017- Declines Procrit.   # CHRONIC MILD THROMBOCYTOPENIA-   # CKD [creat 1.4-1.7]  # CHRONIC CELLULITIS/ AORTIC STENOSIS/ LIMITED MOBILITY   INTERVAL HISTORY:  81 year old female patient with multiple medical problems and above history of chronic anemia from chronic GI blood loss/chronic kidney disease on iron and Procrit is here for follow-up.  In the interim patient was sent over to [facility]- because of GI bleed; she had a colonoscopy.   Patient attributes to bloody diarrhea on Procrit. She declines further Procrit.  She complains of extreme fatigue. Denies any blood in stools. No nausea or vomiting. Swelling in legs are improved.   REVIEW OF SYSTEMS:  A complete 10 point review of system is done which is negative except mentioned above/history of present illness.   PAST MEDICAL HISTORY :  Past Medical History:  Diagnosis Date  . Abnormal LFTs (liver function tests)   . Adenomatous polyps 03/05/2012  . Angiodysplasia   . Aortic stenosis   . Arthritis   . AV malformation of GI tract   . Bilateral cellulitis of lower leg   . CAD (coronary artery disease)   . Cardiomyopathy ([place])   . Carotid bruit   . Cervical cancer ([place])    cervical cancer history  . CHF (congestive heart failure) ([place])   . Chronic edema   . Clostridium difficile infection March 02, 2015  . Complication of anesthesia     I have trouble waking up "  . COPD (chronic obstructive pulmonary disease) ([place])   . Diabetes mellitus with stage 3 chronic  kidney disease ([place])   . Fatty liver   . Fibrocystic breast changes   . GERD (gastroesophageal reflux disease)   . GI bleed   . Gout   . History of blood transfusion   . History of nicotine use   . Hyperlipidemia   . Hypertension   . Iron deficiency anemia 2008  . Irritable bowel syndrome   . Macrocytosis   . Osteoarthritis   . Peptic ulcer disease   . PVD (peripheral vascular disease) ([place])   . Tobacco abuse   . Trigeminal neuralgia   . Vascular malformation 06/14/1997    PAST SURGICAL HISTORY :   Past Surgical History:  Procedure Laterality Date  . ABDOMINAL HYSTERECTOMY    . angiogram cardiac stenting    . BONE MARROW BIOPSY  1999  . CHOLECYSTECTOMY    . COLONOSCOPY  12/2014, 2014, 2013, 2008, 2004   03/05/2012-Adenomatous Polyps;09/23/2003-Polyp remains intact;   . COLONOSCOPY N/A 07/12/2016   Procedure: COLONOSCOPY;  Surgeon: Ronald Lobo, MD;  Location: [facility] ENDOSCOPY;  Service: Endoscopy;  Laterality: N/A;  . Endoscopic Carpal Tunnel Release    . ESOPHAGOGASTRODUODENOSCOPY  12/2014, 2014, 2013, 2009, 2004  . ESOPHAGOGASTRODUODENOSCOPY (EGD) WITH PROPOFOL N/A 07/12/2016   Procedure: ESOPHAGOGASTRODUODENOSCOPY (EGD) WITH PROPOFOL;  Surgeon: Ronald Lobo, MD;  Location: [place];  Service: Endoscopy;  Laterality: N/A;  . HOT HEMOSTASIS N/A 07/12/2016   Procedure: HOT HEMOSTASIS (ARGON PLASMA COAGULATION/BICAP);  Surgeon: Ronald Lobo, MD;  Location: [facility] ENDOSCOPY;  Service: Endoscopy;  Laterality: N/A;  . NASAL SINUS SURGERY    . TEE WITHOUT CARDIOVERSION N/A 12/19/2015   Procedure: TRANSESOPHAGEAL ECHOCARDIOGRAM (TEE);  Surgeon: Yolonda Kida, MD;  Location: [facility] ORS;  Service: Cardiovascular;  Laterality: N/A;  . THORACENTESIS    . TONSILLECTOMY      FAMILY HISTORY :   Family History  Problem Relation Age of Onset  . Parkinsonism Mother   . Heart attack Mother   . Lung cancer Father   . Diabetes Mellitus II Father   . Asthma Sister   . Asthma Brother    . Heart disease Son     SOCIAL HISTORY:   Social History  Substance Use Topics  . Smoking status: Former Smoker    Packs/day: 0.50    Years: 56.00    Types: Cigarettes    Quit date: 05/23/2013  . Smokeless tobacco: Never Used  . Alcohol use No    ALLERGIES:  is allergic to aspirin; atenolol; clopidogrel bisulfate; darvon [propoxyphene]; hydrochlorothiazide; iodinated diagnostic agents; nylon; other; and pravastatin.  MEDICATIONS:  Current Outpatient Prescriptions  Medication Sig Dispense Refill  . acetaminophen (TYLENOL) 500 MG tablet Take 500 mg by mouth at bedtime.    . ciprofloxacin (CIPRO) 500 MG tablet Take 1 tablet (500 mg total) by mouth 2 (two) times daily. 10 tablet 0  . lisinopril (PRINIVIL,ZESTRIL) 40 MG tablet Take 40 mg by mouth daily.    . metoprolol succinate (TOPROL-XL) 50 MG 24 hr tablet Take 50 mg by mouth daily.     . pantoprazole (PROTONIX) 40 MG tablet Take 40 mg by mouth daily.    . simvastatin (ZOCOR) 20 MG tablet Take 20 mg by mouth at bedtime.     . torsemide (DEMADEX) 20 MG tablet Take 1 tablet by mouth 4 (four) times daily.      No current facility-administered medications for this visit.    Facility-Administered Medications Ordered in Other Visits  Medication Dose Route Frequency Provider Last Rate Last Dose  . epoetin alfa (EPOGEN,PROCRIT) injection 20,000 Units  20,000 Units Subcutaneous Once Cammie Sickle, MD        PHYSICAL EXAMINATION: ECOG PERFORMANCE STATUS: 2 - Symptomatic, <50% confined to bed  BP (!) 167/65 (BP Location: Left Arm, Patient Position: Sitting)   Pulse 64   Temp (!) 96.8 F (36 C) (Tympanic)   Resp 18   Wt 196 lb 3 oz (89 kg)   BMI 34.75 kg/m   Filed Weights   07/22/16 1034  Weight: 196 lb 3 oz (89 kg)    GENERAL: Well-nourished well-developed; Alert, no distress and comfortable.   She is accompanied by her brother. She is in a wheelchair. She cannot sit on the exam table.  EYES: Positive for  pallor. OROPHARYNX: no thrush or ulceration; good dentition  NECK: supple, no masses felt LYMPH:  no palpable lymphadenopathy in the cervical,  LUNGS: clear to auscultation and  No wheeze or crackles HEART/CVS: regular rate & rhythm and positive for systolic murmurs; bilateral lower extremity swelling ; left larger than the right /chronic venous stasis changes noted.  ABDOMEN:abdomen soft, non-tender and normal bowel sounds Musculoskeletal:no cyanosis of digits and no clubbing  PSYCH: alert & oriented x 3 with fluent speech NEURO: no focal motor/sensory deficits SKIN:  erythema of the left lower extremity /improving.   LABORATORY DATA:  I have reviewed the data as listed    Component Value Date/Time   NA 139 07/13/2016 0949   NA 139 01/18/2015 0545   K  4.5 07/13/2016 0949   K 3.9 01/18/2015 0545   CL 108 07/13/2016 0949   CL 105 01/18/2015 0545   CO2 24 07/13/2016 0949   CO2 25 01/18/2015 0545   GLUCOSE 223 (H) 07/13/2016 0949   GLUCOSE 179 (H) 01/18/2015 0545   BUN 17 07/13/2016 0949   BUN 29 (H) 01/18/2015 0545   CREATININE 1.73 (H) 07/13/2016 0949   CREATININE 1.80 (H) 01/18/2015 0545   CALCIUM 8.6 (L) 07/13/2016 0949   CALCIUM 8.7 01/18/2015 0545   PROT 6.5 07/09/2016 1101   PROT 6.6 06/20/2014 1101   ALBUMIN 3.4 (L) 07/09/2016 1101   ALBUMIN 3.1 (L) 06/20/2014 1101   AST 24 07/09/2016 1101   AST 32 06/20/2014 1101   ALT 17 07/09/2016 1101   ALT 31 06/20/2014 1101   ALKPHOS 79 07/09/2016 1101   ALKPHOS 125 (H) 06/20/2014 1101   BILITOT 1.0 07/09/2016 1101   BILITOT 0.3 06/20/2014 1101   GFRNONAA 27 (L) 07/13/2016 0949   GFRNONAA 29 (L) 01/18/2015 0545   GFRNONAA 28 (L) 06/20/2014 1101   GFRAA 31 (L) 07/13/2016 0949   GFRAA 35 (L) 01/18/2015 0545   GFRAA 33 (L) 06/20/2014 1101    No results found for: SPEP, UPEP  Lab Results  Component Value Date   WBC 12.5 (H) 07/13/2016   NEUTROABS 3.7 06/24/2016   HGB 9.0 (L) 07/22/2016   HCT 26.9 (L) 07/22/2016   MCV  93.0 07/13/2016   PLT 117 (L) 07/13/2016      Chemistry      Component Value Date/Time   NA 139 07/13/2016 0949   NA 139 01/18/2015 0545   K 4.5 07/13/2016 0949   K 3.9 01/18/2015 0545   CL 108 07/13/2016 0949   CL 105 01/18/2015 0545   CO2 24 07/13/2016 0949   CO2 25 01/18/2015 0545   BUN 17 07/13/2016 0949   BUN 29 (H) 01/18/2015 0545   CREATININE 1.73 (H) 07/13/2016 0949   CREATININE 1.80 (H) 01/18/2015 0545      Component Value Date/Time   CALCIUM 8.6 (L) 07/13/2016 0949   CALCIUM 8.7 01/18/2015 0545   ALKPHOS 79 07/09/2016 1101   ALKPHOS 125 (H) 06/20/2014 1101   AST 24 07/09/2016 1101   AST 32 06/20/2014 1101   ALT 17 07/09/2016 1101   ALT 31 06/20/2014 1101   BILITOT 1.0 07/09/2016 1101   BILITOT 0.3 06/20/2014 1101        ASSESSMENT & PLAN:   Anemia of chronic kidney failure # CHRONIC ANEMIA-chronic kidney disease/ secondary to GI bleed/AV malformations; iron deficiency. S/p 3 units PRBC/ GI colo-egd in July 2017 [Dr.Buccini]- AV malformations.   # pt declines Procrit/ ? Bloody diarrhea. STOP procrit.    # Chronic kidney disease- creatinine-  ~2/ weight gain- follow up with PCP. Follows up this week.   # Check H&H every week; ferrahem IV iron monthly. Follow-up with me in 3 months [facility] BMP/Iron studies/ Ferritin.      Cammie Sickle, MD 07/24/2016 6:37 PM

## 2016-07-24 ENCOUNTER — Ambulatory Visit: Payer: Medicare Other | Admitting: Internal Medicine

## 2016-07-29 ENCOUNTER — Encounter: Payer: Self-pay | Admitting: *Deleted

## 2016-07-29 ENCOUNTER — Other Ambulatory Visit: Payer: Self-pay | Admitting: Internal Medicine

## 2016-07-29 ENCOUNTER — Inpatient Hospital Stay: Payer: Medicare Other | Attending: Internal Medicine

## 2016-07-29 ENCOUNTER — Inpatient Hospital Stay: Payer: Medicare Other

## 2016-07-29 DIAGNOSIS — N184 Chronic kidney disease, stage 4 (severe): Secondary | ICD-10-CM | POA: Insufficient documentation

## 2016-07-29 DIAGNOSIS — N183 Chronic kidney disease, stage 3 (moderate): Secondary | ICD-10-CM

## 2016-07-29 DIAGNOSIS — D631 Anemia in chronic kidney disease: Secondary | ICD-10-CM | POA: Insufficient documentation

## 2016-07-29 DIAGNOSIS — I130 Hypertensive heart and chronic kidney disease with heart failure and stage 1 through stage 4 chronic kidney disease, or unspecified chronic kidney disease: Secondary | ICD-10-CM | POA: Insufficient documentation

## 2016-07-29 DIAGNOSIS — Z79899 Other long term (current) drug therapy: Secondary | ICD-10-CM | POA: Insufficient documentation

## 2016-07-29 DIAGNOSIS — D509 Iron deficiency anemia, unspecified: Secondary | ICD-10-CM

## 2016-07-29 LAB — HEMOGLOBIN: HEMOGLOBIN: 8.4 g/dL — AB (ref 12.0–16.0)

## 2016-07-29 LAB — SAMPLE TO BLOOD BANK

## 2016-07-29 LAB — HEMATOCRIT: HCT: 25.5 % — ABNORMAL LOW (ref 35.0–47.0)

## 2016-07-29 NOTE — Progress Notes (Signed)
Pt came for lab only today. She waited on lab results. Labs provided to patient. hgb 8.4 today.  Per md order, pt will nee to return as scheduled for labs and possible IV iron in 1 week.  Pt verbalized understanding of the plan.

## 2016-08-05 ENCOUNTER — Inpatient Hospital Stay: Payer: Medicare Other

## 2016-08-05 ENCOUNTER — Telehealth: Payer: Self-pay | Admitting: *Deleted

## 2016-08-05 ENCOUNTER — Other Ambulatory Visit: Payer: Self-pay | Admitting: Internal Medicine

## 2016-08-05 ENCOUNTER — Other Ambulatory Visit: Payer: Self-pay

## 2016-08-05 DIAGNOSIS — D631 Anemia in chronic kidney disease: Secondary | ICD-10-CM

## 2016-08-05 DIAGNOSIS — N183 Chronic kidney disease, stage 3 unspecified: Secondary | ICD-10-CM

## 2016-08-05 DIAGNOSIS — N189 Chronic kidney disease, unspecified: Principal | ICD-10-CM

## 2016-08-05 DIAGNOSIS — I130 Hypertensive heart and chronic kidney disease with heart failure and stage 1 through stage 4 chronic kidney disease, or unspecified chronic kidney disease: Secondary | ICD-10-CM | POA: Diagnosis not present

## 2016-08-05 DIAGNOSIS — D509 Iron deficiency anemia, unspecified: Secondary | ICD-10-CM

## 2016-08-05 LAB — PREPARE RBC (CROSSMATCH)

## 2016-08-05 LAB — SAMPLE TO BLOOD BANK

## 2016-08-05 LAB — HEMATOCRIT: HEMATOCRIT: 23 % — AB (ref 35.0–47.0)

## 2016-08-05 LAB — IRON AND TIBC
Iron: 87 ug/dL (ref 28–170)
SATURATION RATIOS: 28 % (ref 10.4–31.8)
TIBC: 313 ug/dL (ref 250–450)
UIBC: 226 ug/dL

## 2016-08-05 LAB — HEMOGLOBIN: HEMOGLOBIN: 7.7 g/dL — AB (ref 12.0–16.0)

## 2016-08-05 LAB — FERRITIN: FERRITIN: 113 ng/mL (ref 11–307)

## 2016-08-05 NOTE — Telephone Encounter (Signed)
Critical Hgb 7.7   MD notified.

## 2016-08-06 ENCOUNTER — Inpatient Hospital Stay: Payer: Medicare Other

## 2016-08-06 ENCOUNTER — Other Ambulatory Visit: Payer: Self-pay | Admitting: Internal Medicine

## 2016-08-06 VITALS — BP 139/61 | HR 76 | Temp 97.2°F | Resp 18

## 2016-08-06 DIAGNOSIS — I130 Hypertensive heart and chronic kidney disease with heart failure and stage 1 through stage 4 chronic kidney disease, or unspecified chronic kidney disease: Secondary | ICD-10-CM | POA: Diagnosis not present

## 2016-08-06 DIAGNOSIS — N189 Chronic kidney disease, unspecified: Principal | ICD-10-CM

## 2016-08-06 DIAGNOSIS — D631 Anemia in chronic kidney disease: Secondary | ICD-10-CM

## 2016-08-06 DIAGNOSIS — D500 Iron deficiency anemia secondary to blood loss (chronic): Secondary | ICD-10-CM

## 2016-08-06 LAB — PREPARE RBC (CROSSMATCH)

## 2016-08-06 MED ORDER — FUROSEMIDE 10 MG/ML IJ SOLN
20.0000 mg | Freq: Once | INTRAMUSCULAR | Status: AC
  Administered 2016-08-06: 20 mg via INTRAVENOUS
  Filled 2016-08-06: qty 2

## 2016-08-06 MED ORDER — FUROSEMIDE 10 MG/ML IJ SOLN: 20.0000 mg | Freq: Once | INTRAMUSCULAR | Status: AC

## 2016-08-06 MED ORDER — HEPARIN SOD (PORK) LOCK FLUSH 100 UNIT/ML IV SOLN
500.0000 [IU] | Freq: Once | INTRAVENOUS | Status: AC
  Administered 2016-08-06: 500 [IU] via INTRAVENOUS

## 2016-08-06 MED ORDER — SODIUM CHLORIDE 0.9 % IV SOLN
250.0000 mL | Freq: Once | INTRAVENOUS | Status: AC
  Administered 2016-08-23 (×2): via INTRAVENOUS
  Filled 2016-08-06: qty 250

## 2016-08-06 MED ORDER — HEPARIN SOD (PORK) LOCK FLUSH 100 UNIT/ML IV SOLN: 250.0000 [IU] | INTRAVENOUS | Status: AC | PRN

## 2016-08-06 MED ORDER — SODIUM CHLORIDE 0.9 % IV SOLN
INTRAVENOUS | Status: DC
  Administered 2016-08-06: 10:00:00 via INTRAVENOUS
  Filled 2016-08-06: qty 1000

## 2016-08-06 MED ORDER — HEPARIN SOD (PORK) LOCK FLUSH 100 UNIT/ML IV SOLN
INTRAVENOUS | Status: AC
  Filled 2016-08-06: qty 5

## 2016-08-06 MED ORDER — ACETAMINOPHEN 325 MG PO TABS
650.0000 mg | ORAL_TABLET | Freq: Once | ORAL | Status: AC
  Administered 2016-08-06: 650 mg via ORAL
  Filled 2016-08-06: qty 2

## 2016-08-06 MED ORDER — SODIUM CHLORIDE 0.9 % IJ SOLN
10.0000 mL | Freq: Once | INTRAMUSCULAR | Status: AC
  Administered 2016-08-06: 10 mL via INTRAVENOUS
  Filled 2016-08-06: qty 10

## 2016-08-06 MED ORDER — ACETAMINOPHEN 325 MG PO TABS: 650.0000 mg | ORAL_TABLET | Freq: Once | ORAL | Status: AC

## 2016-08-06 MED ORDER — SODIUM CHLORIDE 0.9% FLUSH
10.0000 mL | INTRAVENOUS | Status: AC | PRN
  Filled 2016-08-06: qty 10

## 2016-08-06 MED ORDER — DIPHENHYDRAMINE HCL 25 MG PO TABS
25.0000 mg | ORAL_TABLET | Freq: Once | ORAL | Status: AC
  Administered 2016-08-06: 25 mg via ORAL
  Filled 2016-08-06: qty 1

## 2016-08-06 MED ORDER — SODIUM CHLORIDE 0.9% FLUSH
3.0000 mL | INTRAVENOUS | Status: AC | PRN
  Filled 2016-08-06: qty 3

## 2016-08-06 MED ORDER — DIPHENHYDRAMINE HCL 25 MG PO CAPS: 25.0000 mg | ORAL_CAPSULE | Freq: Once | ORAL | Status: AC

## 2016-08-06 MED ORDER — HEPARIN SOD (PORK) LOCK FLUSH 100 UNIT/ML IV SOLN: 500.0000 [IU] | Freq: Every day | INTRAVENOUS | Status: AC | PRN

## 2016-08-07 LAB — TYPE AND SCREEN
ABO/RH(D): B POS
Antibody Screen: NEGATIVE
Unit division: 0
Unit division: 0

## 2016-08-12 ENCOUNTER — Inpatient Hospital Stay: Payer: Medicare Other

## 2016-08-12 DIAGNOSIS — D509 Iron deficiency anemia, unspecified: Secondary | ICD-10-CM

## 2016-08-12 DIAGNOSIS — D631 Anemia in chronic kidney disease: Secondary | ICD-10-CM

## 2016-08-12 DIAGNOSIS — I130 Hypertensive heart and chronic kidney disease with heart failure and stage 1 through stage 4 chronic kidney disease, or unspecified chronic kidney disease: Secondary | ICD-10-CM | POA: Diagnosis not present

## 2016-08-12 DIAGNOSIS — N183 Chronic kidney disease, stage 3 unspecified: Secondary | ICD-10-CM

## 2016-08-12 LAB — HEMATOCRIT: HEMATOCRIT: 26.1 % — AB (ref 35.0–47.0)

## 2016-08-12 LAB — SAMPLE TO BLOOD BANK

## 2016-08-12 LAB — HEMOGLOBIN: HEMOGLOBIN: 8.9 g/dL — AB (ref 12.0–16.0)

## 2016-08-19 ENCOUNTER — Other Ambulatory Visit: Payer: Self-pay | Admitting: Internal Medicine

## 2016-08-19 ENCOUNTER — Telehealth: Payer: Self-pay | Admitting: *Deleted

## 2016-08-19 ENCOUNTER — Inpatient Hospital Stay: Payer: Medicare Other

## 2016-08-19 ENCOUNTER — Other Ambulatory Visit: Payer: Self-pay | Admitting: *Deleted

## 2016-08-19 DIAGNOSIS — I130 Hypertensive heart and chronic kidney disease with heart failure and stage 1 through stage 4 chronic kidney disease, or unspecified chronic kidney disease: Secondary | ICD-10-CM | POA: Diagnosis not present

## 2016-08-19 DIAGNOSIS — D500 Iron deficiency anemia secondary to blood loss (chronic): Secondary | ICD-10-CM

## 2016-08-19 DIAGNOSIS — D509 Iron deficiency anemia, unspecified: Secondary | ICD-10-CM

## 2016-08-19 DIAGNOSIS — D631 Anemia in chronic kidney disease: Secondary | ICD-10-CM

## 2016-08-19 DIAGNOSIS — N183 Chronic kidney disease, stage 3 (moderate): Principal | ICD-10-CM

## 2016-08-19 LAB — SAMPLE TO BLOOD BANK

## 2016-08-19 LAB — HEMOGLOBIN: HEMOGLOBIN: 6.8 g/dL — AB (ref 12.0–16.0)

## 2016-08-19 LAB — HEMATOCRIT: HEMATOCRIT: 20.4 % — AB (ref 35.0–47.0)

## 2016-08-19 LAB — PREPARE RBC (CROSSMATCH)

## 2016-08-19 NOTE — Progress Notes (Signed)
Blood orders released and spoke to blood bank.

## 2016-08-19 NOTE — Telephone Encounter (Signed)
Critical Hgb. 6.8  MD notified.

## 2016-08-20 ENCOUNTER — Inpatient Hospital Stay: Payer: Medicare Other

## 2016-08-20 DIAGNOSIS — I130 Hypertensive heart and chronic kidney disease with heart failure and stage 1 through stage 4 chronic kidney disease, or unspecified chronic kidney disease: Secondary | ICD-10-CM | POA: Diagnosis not present

## 2016-08-20 DIAGNOSIS — D500 Iron deficiency anemia secondary to blood loss (chronic): Secondary | ICD-10-CM

## 2016-08-20 MED ORDER — SODIUM CHLORIDE 0.9 % IV SOLN
250.0000 mL | Freq: Once | INTRAVENOUS | Status: AC
  Administered 2016-08-20: 250 mL via INTRAVENOUS
  Filled 2016-08-20: qty 250

## 2016-08-20 MED ORDER — ACETAMINOPHEN 325 MG PO TABS
650.0000 mg | ORAL_TABLET | Freq: Once | ORAL | Status: AC
  Administered 2016-08-20: 650 mg via ORAL
  Filled 2016-08-20: qty 2

## 2016-08-20 MED ORDER — FUROSEMIDE 10 MG/ML IJ SOLN
20.0000 mg | Freq: Once | INTRAMUSCULAR | Status: AC
  Administered 2016-08-20: 20 mg via INTRAVENOUS
  Filled 2016-08-20: qty 2

## 2016-08-20 MED ORDER — DIPHENHYDRAMINE HCL 25 MG PO CAPS
25.0000 mg | ORAL_CAPSULE | Freq: Once | ORAL | Status: AC
  Administered 2016-08-20: 25 mg via ORAL
  Filled 2016-08-20: qty 1

## 2016-08-20 MED ORDER — SODIUM CHLORIDE 0.9% FLUSH
10.0000 mL | INTRAVENOUS | Status: AC | PRN
  Administered 2016-08-20: 10 mL
  Filled 2016-08-20: qty 10

## 2016-08-20 MED ORDER — HEPARIN SOD (PORK) LOCK FLUSH 100 UNIT/ML IV SOLN
500.0000 [IU] | Freq: Every day | INTRAVENOUS | Status: AC | PRN
  Administered 2016-08-20: 500 [IU]
  Filled 2016-08-20: qty 5

## 2016-08-21 LAB — TYPE AND SCREEN
ABO/RH(D): B POS
ANTIBODY SCREEN: NEGATIVE
UNIT DIVISION: 0
Unit division: 0
Unit division: 0

## 2016-08-22 ENCOUNTER — Observation Stay
Admission: EM | Admit: 2016-08-22 | Discharge: 2016-08-24 | Disposition: A | Discharge status: Home / Self Care | Payer: Medicare Other | Attending: Internal Medicine | Admitting: Internal Medicine

## 2016-08-22 ENCOUNTER — Encounter: Payer: Self-pay | Admitting: Emergency Medicine

## 2016-08-22 DIAGNOSIS — E1151 Type 2 diabetes mellitus with diabetic peripheral angiopathy without gangrene: Secondary | ICD-10-CM | POA: Diagnosis not present

## 2016-08-22 DIAGNOSIS — I130 Hypertensive heart and chronic kidney disease with heart failure and stage 1 through stage 4 chronic kidney disease, or unspecified chronic kidney disease: Secondary | ICD-10-CM | POA: Insufficient documentation

## 2016-08-22 DIAGNOSIS — G500 Trigeminal neuralgia: Secondary | ICD-10-CM | POA: Insufficient documentation

## 2016-08-22 DIAGNOSIS — M109 Gout, unspecified: Secondary | ICD-10-CM | POA: Diagnosis not present

## 2016-08-22 DIAGNOSIS — D649 Anemia, unspecified: Secondary | ICD-10-CM

## 2016-08-22 DIAGNOSIS — Z87891 Personal history of nicotine dependence: Secondary | ICD-10-CM | POA: Insufficient documentation

## 2016-08-22 DIAGNOSIS — K625 Hemorrhage of anus and rectum: Secondary | ICD-10-CM

## 2016-08-22 DIAGNOSIS — Z833 Family history of diabetes mellitus: Secondary | ICD-10-CM | POA: Insufficient documentation

## 2016-08-22 DIAGNOSIS — Z801 Family history of malignant neoplasm of trachea, bronchus and lung: Secondary | ICD-10-CM | POA: Insufficient documentation

## 2016-08-22 DIAGNOSIS — N289 Disorder of kidney and ureter, unspecified: Secondary | ICD-10-CM

## 2016-08-22 DIAGNOSIS — D126 Benign neoplasm of colon, unspecified: Secondary | ICD-10-CM

## 2016-08-22 DIAGNOSIS — K589 Irritable bowel syndrome without diarrhea: Secondary | ICD-10-CM | POA: Diagnosis not present

## 2016-08-22 DIAGNOSIS — K5521 Angiodysplasia of colon with hemorrhage: Secondary | ICD-10-CM | POA: Diagnosis not present

## 2016-08-22 DIAGNOSIS — I509 Heart failure, unspecified: Secondary | ICD-10-CM | POA: Diagnosis not present

## 2016-08-22 DIAGNOSIS — Z79899 Other long term (current) drug therapy: Secondary | ICD-10-CM | POA: Diagnosis not present

## 2016-08-22 DIAGNOSIS — Z825 Family history of asthma and other chronic lower respiratory diseases: Secondary | ICD-10-CM | POA: Insufficient documentation

## 2016-08-22 DIAGNOSIS — E785 Hyperlipidemia, unspecified: Secondary | ICD-10-CM | POA: Diagnosis not present

## 2016-08-22 DIAGNOSIS — K921 Melena: Secondary | ICD-10-CM | POA: Diagnosis not present

## 2016-08-22 DIAGNOSIS — K760 Fatty (change of) liver, not elsewhere classified: Secondary | ICD-10-CM | POA: Diagnosis not present

## 2016-08-22 DIAGNOSIS — K641 Second degree hemorrhoids: Secondary | ICD-10-CM | POA: Diagnosis not present

## 2016-08-22 DIAGNOSIS — Q2733 Arteriovenous malformation of digestive system vessel: Secondary | ICD-10-CM | POA: Insufficient documentation

## 2016-08-22 DIAGNOSIS — Z8541 Personal history of malignant neoplasm of cervix uteri: Secondary | ICD-10-CM | POA: Diagnosis not present

## 2016-08-22 DIAGNOSIS — I350 Nonrheumatic aortic (valve) stenosis: Secondary | ICD-10-CM | POA: Insufficient documentation

## 2016-08-22 DIAGNOSIS — J449 Chronic obstructive pulmonary disease, unspecified: Secondary | ICD-10-CM | POA: Diagnosis not present

## 2016-08-22 DIAGNOSIS — D509 Iron deficiency anemia, unspecified: Secondary | ICD-10-CM

## 2016-08-22 DIAGNOSIS — Z8711 Personal history of peptic ulcer disease: Secondary | ICD-10-CM | POA: Diagnosis not present

## 2016-08-22 DIAGNOSIS — N183 Chronic kidney disease, stage 3 (moderate): Secondary | ICD-10-CM | POA: Insufficient documentation

## 2016-08-22 DIAGNOSIS — D696 Thrombocytopenia, unspecified: Secondary | ICD-10-CM | POA: Diagnosis not present

## 2016-08-22 DIAGNOSIS — I2510 Atherosclerotic heart disease of native coronary artery without angina pectoris: Secondary | ICD-10-CM | POA: Insufficient documentation

## 2016-08-22 DIAGNOSIS — K922 Gastrointestinal hemorrhage, unspecified: Secondary | ICD-10-CM | POA: Diagnosis present

## 2016-08-22 DIAGNOSIS — Z886 Allergy status to analgesic agent status: Secondary | ICD-10-CM | POA: Insufficient documentation

## 2016-08-22 DIAGNOSIS — N179 Acute kidney failure, unspecified: Secondary | ICD-10-CM | POA: Diagnosis not present

## 2016-08-22 DIAGNOSIS — E1122 Type 2 diabetes mellitus with diabetic chronic kidney disease: Secondary | ICD-10-CM | POA: Diagnosis not present

## 2016-08-22 DIAGNOSIS — Z86010 Personal history of colonic polyps: Secondary | ICD-10-CM | POA: Insufficient documentation

## 2016-08-22 DIAGNOSIS — Z820 Family history of epilepsy and other diseases of the nervous system: Secondary | ICD-10-CM | POA: Insufficient documentation

## 2016-08-22 DIAGNOSIS — Z888 Allergy status to other drugs, medicaments and biological substances status: Secondary | ICD-10-CM | POA: Insufficient documentation

## 2016-08-22 DIAGNOSIS — Z91048 Other nonmedicinal substance allergy status: Secondary | ICD-10-CM | POA: Insufficient documentation

## 2016-08-22 DIAGNOSIS — M1990 Unspecified osteoarthritis, unspecified site: Secondary | ICD-10-CM | POA: Diagnosis not present

## 2016-08-22 DIAGNOSIS — D62 Acute posthemorrhagic anemia: Secondary | ICD-10-CM | POA: Diagnosis not present

## 2016-08-22 DIAGNOSIS — K219 Gastro-esophageal reflux disease without esophagitis: Secondary | ICD-10-CM | POA: Insufficient documentation

## 2016-08-22 DIAGNOSIS — I429 Cardiomyopathy, unspecified: Secondary | ICD-10-CM | POA: Insufficient documentation

## 2016-08-22 DIAGNOSIS — Z955 Presence of coronary angioplasty implant and graft: Secondary | ICD-10-CM | POA: Insufficient documentation

## 2016-08-22 DIAGNOSIS — Z91041 Radiographic dye allergy status: Secondary | ICD-10-CM | POA: Insufficient documentation

## 2016-08-22 LAB — COMPREHENSIVE METABOLIC PANEL
ALBUMIN: 2.7 g/dL — AB (ref 3.5–5.0)
ALK PHOS: 82 U/L (ref 38–126)
ALT: 13 U/L — ABNORMAL LOW (ref 14–54)
AST: 24 U/L (ref 15–41)
Anion gap: 8 (ref 5–15)
BILIRUBIN TOTAL: 0.7 mg/dL (ref 0.3–1.2)
BUN: 34 mg/dL — AB (ref 6–20)
CALCIUM: 8 mg/dL — AB (ref 8.9–10.3)
CO2: 25 mmol/L (ref 22–32)
Chloride: 101 mmol/L (ref 101–111)
Creatinine, Ser: 2.18 mg/dL — ABNORMAL HIGH (ref 0.44–1.00)
GFR calc Af Amer: 24 mL/min — ABNORMAL LOW (ref >60)
GFR calc non Af Amer: 20 mL/min — ABNORMAL LOW (ref >60)
GLUCOSE: 266 mg/dL — AB (ref 65–99)
Potassium: 4.2 mmol/L (ref 3.5–5.1)
Sodium: 134 mmol/L — ABNORMAL LOW (ref 135–145)
TOTAL PROTEIN: 6 g/dL — AB (ref 6.5–8.1)

## 2016-08-22 LAB — CBC WITH DIFFERENTIAL/PLATELET
BASOS ABS: 0.1 10*3/uL (ref 0–0.1)
BASOS PCT: 1 %
EOS ABS: 0.1 10*3/uL (ref 0–0.7)
Eosinophils Relative: 1 %
HEMATOCRIT: 26.5 % — AB (ref 35.0–47.0)
HEMOGLOBIN: 8.9 g/dL — AB (ref 12.0–16.0)
Lymphocytes Relative: 6 %
Lymphs Abs: 0.5 10*3/uL — ABNORMAL LOW (ref 1.0–3.6)
MCH: 31.1 pg (ref 26.0–34.0)
MCHC: 33.5 g/dL (ref 32.0–36.0)
MCV: 92.7 fL (ref 80.0–100.0)
Monocytes Absolute: 0.6 10*3/uL (ref 0.2–0.9)
Monocytes Relative: 8 %
NEUTROS ABS: 6.8 10*3/uL — AB (ref 1.4–6.5)
NEUTROS PCT: 84 %
Platelets: 155 10*3/uL (ref 150–440)
RBC: 2.86 MIL/uL — AB (ref 3.80–5.20)
RDW: 15.8 % — ABNORMAL HIGH (ref 11.5–14.5)
WBC: 8.1 10*3/uL (ref 3.6–11.0)

## 2016-08-22 LAB — CBC
HCT: 23.7 % — ABNORMAL LOW (ref 35.0–47.0)
Hemoglobin: 8.1 g/dL — ABNORMAL LOW (ref 12.0–16.0)
MCH: 31.3 pg (ref 26.0–34.0)
MCHC: 34 g/dL (ref 32.0–36.0)
MCV: 92.2 fL (ref 80.0–100.0)
PLATELETS: 129 10*3/uL — AB (ref 150–440)
RBC: 2.57 MIL/uL — ABNORMAL LOW (ref 3.80–5.20)
RDW: 16.1 % — AB (ref 11.5–14.5)
WBC: 5.4 10*3/uL (ref 3.6–11.0)

## 2016-08-22 LAB — PROTIME-INR
INR: 1
Prothrombin Time: 13.2 seconds (ref 11.4–15.2)

## 2016-08-22 MED ORDER — ACETAMINOPHEN 650 MG RE SUPP: 650.0000 mg | Freq: Four times a day (QID) | RECTAL | Status: DC | PRN

## 2016-08-22 MED ORDER — SODIUM CHLORIDE 0.9 % IV SOLN
Freq: Once | INTRAVENOUS | Status: AC
  Administered 2016-08-22: 14:00:00 via INTRAVENOUS

## 2016-08-22 MED ORDER — PEG 3350-KCL-NA BICARB-NACL 420 G PO SOLR
4000.0000 mL | Freq: Once | ORAL | Status: AC
  Administered 2016-08-22: 20:00:00 4000 mL via ORAL
  Filled 2016-08-22: qty 4000

## 2016-08-22 MED ORDER — ONDANSETRON HCL 4 MG/2ML IJ SOLN: 4.0000 mg | Freq: Four times a day (QID) | INTRAMUSCULAR | Status: DC | PRN

## 2016-08-22 MED ORDER — PANTOPRAZOLE SODIUM 40 MG PO TBEC: 40.0000 mg | DELAYED_RELEASE_TABLET | Freq: Every day | ORAL | Status: DC

## 2016-08-22 MED ORDER — METOPROLOL SUCCINATE ER 50 MG PO TB24
50.0000 mg | ORAL_TABLET | Freq: Every day | ORAL | Status: DC
  Administered 2016-08-24: 10:00:00 50 mg via ORAL
  Filled 2016-08-22: qty 1

## 2016-08-22 MED ORDER — ONDANSETRON HCL 4 MG PO TABS: 4.0000 mg | ORAL_TABLET | Freq: Four times a day (QID) | ORAL | Status: DC | PRN

## 2016-08-22 MED ORDER — ACETAMINOPHEN 325 MG PO TABS
650.0000 mg | ORAL_TABLET | Freq: Four times a day (QID) | ORAL | Status: DC | PRN
  Administered 2016-08-22 – 2016-08-23 (×2): 650 mg via ORAL
  Filled 2016-08-22 (×2): qty 2

## 2016-08-22 MED ORDER — SODIUM CHLORIDE 0.9 % IV SOLN
INTRAVENOUS | Status: AC
  Administered 2016-08-22 – 2016-08-23 (×2): via INTRAVENOUS

## 2016-08-22 MED ORDER — OXYCODONE HCL 5 MG PO TABS: 5.0000 mg | ORAL_TABLET | ORAL | Status: DC | PRN

## 2016-08-22 MED ORDER — SIMVASTATIN 20 MG PO TABS
20.0000 mg | ORAL_TABLET | Freq: Every day | ORAL | Status: DC
  Administered 2016-08-22 – 2016-08-23 (×2): 20 mg via ORAL
  Filled 2016-08-22 (×2): qty 1

## 2016-08-22 MED ORDER — PANTOPRAZOLE SODIUM 40 MG IV SOLR
40.0000 mg | Freq: Two times a day (BID) | INTRAVENOUS | Status: DC
  Administered 2016-08-22 – 2016-08-24 (×3): 40 mg via INTRAVENOUS
  Filled 2016-08-22 (×3): qty 40

## 2016-08-22 MED ORDER — FAMOTIDINE IN NACL 20-0.9 MG/50ML-% IV SOLN
20.0000 mg | Freq: Every day | INTRAVENOUS | Status: DC
  Filled 2016-08-22: qty 50

## 2016-08-22 NOTE — ED Notes (Signed)
Patient assisted to the bathroom by another nurse.

## 2016-08-22 NOTE — ED Triage Notes (Signed)
Pt to ed with c/o bloody stools since Tuesday.  Pt reports HGB was 6.6 yesterday.

## 2016-08-22 NOTE — H&P (Signed)
[facility] [place] NAME: Morgan [name]    MR#:  [id number]  DATE OF BIRTH:  Feb 01, 1935   DATE OF ADMISSION:  08/22/2016  PRIMARY CARE PHYSICIAN: Kirk Ruths., MD   REQUESTING/REFERRING PHYSICIAN: Jimmye Norman  CHIEF COMPLAINT:   Chief Complaint  Patient presents with  . Rectal Bleeding    HISTORY OF PRESENT ILLNESS:  Morgan [name]  is a 81 y.o. female with a known history of Aortic stenosis, AV malformations with recurrent GI bleeding who is presenting with GI bleeding. She states that for the past 2-3 days she has been experiencing bright red blood per rectum with associated lower abdominal cramping intensity 2-3/10 without worsening or relieving factors nonradiating. She had blood work drawn earlier in the week which revealed hemoglobin of 6.6 she is then received 2 units packed red blood cell transfusion as an outpatient but given persistence of bleeding present to Hospital further workup and evaluation. She follows with Dr. Vira Agar of gastroenterology as an outpatient. She attests to having poor oral intake denies any other symptoms at this time. Plan to have hemoglobin 8.9 with evidence of acute kidney injury.  She been on Procrit but was intolerant secondary to GI distress  PAST MEDICAL HISTORY:   Past Medical History:  Diagnosis Date  . Abnormal LFTs (liver function tests)   . Adenomatous polyps 03/05/2012  . Angiodysplasia   . Aortic stenosis   . Arthritis   . AV malformation of GI tract   . Bilateral cellulitis of lower leg   . CAD (coronary artery disease)   . Cardiomyopathy ([place])   . Carotid bruit   . Cervical cancer ([place])    cervical cancer history  . CHF (congestive heart failure) ([place])   . Chronic edema   . Clostridium difficile infection March 02, 2015  . Complication of anesthesia     I have trouble waking up "  . COPD (chronic obstructive pulmonary disease) ([place])   . Diabetes mellitus with stage 3 chronic  kidney disease ([place])   . Fatty liver   . Fibrocystic breast changes   . GERD (gastroesophageal reflux disease)   . GI bleed   . Gout   . History of blood transfusion   . History of nicotine use   . Hyperlipidemia   . Hypertension   . Iron deficiency anemia 2008  . Irritable bowel syndrome   . Macrocytosis   . Osteoarthritis   . Peptic ulcer disease   . PVD (peripheral vascular disease) ([place])   . Tobacco abuse   . Trigeminal neuralgia   . Vascular malformation 06/14/1997    PAST SURGICAL HISTORY:   Past Surgical History:  Procedure Laterality Date  . ABDOMINAL HYSTERECTOMY    . angiogram cardiac stenting    . BONE MARROW BIOPSY  1999  . CHOLECYSTECTOMY    . COLONOSCOPY  12/2014, 2014, 2013, 2008, 2004   03/05/2012-Adenomatous Polyps;09/23/2003-Polyp remains intact;   . COLONOSCOPY N/A 07/12/2016   Procedure: COLONOSCOPY;  Surgeon: Ronald Lobo, MD;  Location[facility] ENDOSCOPY;  Service: Endoscopy;  Laterality: N/A;  . Endoscopic Carpal Tunnel Release    . ESOPHAGOGASTRODUODENOSCOPY  12/2014, 2014, 2013, 2009, 2004  . ESOPHAGOGASTRODUODENOSCOPY (EGD) WITH PROPOFOL N/A 07/12/2016   Procedure: ESOPHAGOGASTRODUODENOSCOPY (EGD) WITH PROPOFOL;  Surgeon: Ronald Lobo, MD;  Location: [place];  Service: Endoscopy;  Laterality: N/A;  . HOT HEMOSTASIS N/A 07/12/2016   Procedure: HOT HEMOSTASIS (ARGON PLASMA COAGULATION/BICAP);  Surgeon: Ronald Lobo, MD;  Location: [facility]  ENDOSCOPY;  Service: Endoscopy;  Laterality: N/A;  . NASAL SINUS SURGERY    . TEE WITHOUT CARDIOVERSION N/A 12/19/2015   Procedure: TRANSESOPHAGEAL ECHOCARDIOGRAM (TEE);  Surgeon: Yolonda Kida, MD;  Location: [facility] ORS;  Service: Cardiovascular;  Laterality: N/A;  . THORACENTESIS    . TONSILLECTOMY      SOCIAL HISTORY:   Social History  Substance Use Topics  . Smoking status: Former Smoker    Packs/day: 0.50    Years: 56.00    Types: Cigarettes    Quit date: 05/23/2013  . Smokeless tobacco: Never Used  .  Alcohol use No    FAMILY HISTORY:   Family History  Problem Relation Age of Onset  . Parkinsonism Mother   . Heart attack Mother   . Lung cancer Father   . Diabetes Mellitus II Father   . Asthma Sister   . Asthma Brother   . Heart disease Son     DRUG ALLERGIES:   Allergies  Allergen Reactions  . Aspirin Other (See Comments)    bleeding  . Atenolol Other (See Comments)    fatigue  . Clopidogrel Bisulfate Other (See Comments)    bleeding  . Darvon [Propoxyphene] Hives and Other (See Comments)  . Hydrochlorothiazide Other (See Comments)    Fatigue and cramps  . Iodinated Diagnostic Agents Other (See Comments)    Questionable GI bleed  . Nylon Hives  . Other Other (See Comments)    Nylon sutures  . Pravastatin Other (See Comments)    Severe cramping    REVIEW OF SYSTEMS:  REVIEW OF SYSTEMS:  CONSTITUTIONAL: Denies fevers, chills, Positive fatigue, weakness.  EYES: Denies blurred vision, double vision, or eye pain.  EARS, NOSE, THROAT: Denies tinnitus, ear pain, hearing loss.  RESPIRATORY: denies cough, shortness of breath, wheezing  CARDIOVASCULAR: Denies chest pain, palpitations, edema.  GASTROINTESTINAL: Denies nausea, vomiting, diarrhea, abdominal pain. Positive bright red blood per rectum GENITOURINARY: Denies dysuria, hematuria.  ENDOCRINE: Denies nocturia or thyroid problems. HEMATOLOGIC AND LYMPHATIC: Denies easy bruising or bleeding.  SKIN: Denies rash or lesions.  MUSCULOSKELETAL: Denies pain in neck, back, shoulder, knees, hips, or further arthritic symptoms.  NEUROLOGIC: Denies paralysis, paresthesias.  PSYCHIATRIC: Denies anxiety or depressive symptoms. Otherwise full review of systems performed by me is negative.   MEDICATIONS AT HOME:   Prior to Admission medications   Medication Sig Start Date End Date Taking? Authorizing Provider  acetaminophen (TYLENOL) 500 MG tablet Take 500 mg by mouth at bedtime.   Yes Historical Provider, MD    ciprofloxacin (CIPRO) 500 MG tablet Take 1 tablet (500 mg total) by mouth 2 (two) times daily. 07/13/16  Yes Kelvin Cellar, MD  lisinopril (PRINIVIL,ZESTRIL) 40 MG tablet Take 40 mg by mouth daily.   Yes Historical Provider, MD  metoprolol succinate (TOPROL-XL) 50 MG 24 hr tablet Take 50 mg by mouth daily.    Yes Historical Provider, MD  pantoprazole (PROTONIX) 40 MG tablet Take 40 mg by mouth daily.   Yes Historical Provider, MD  simvastatin (ZOCOR) 20 MG tablet Take 20 mg by mouth at bedtime.    Yes Historical Provider, MD  torsemide (DEMADEX) 20 MG tablet Take 1 tablet by mouth 4 (four) times daily.    Yes Historical Provider, MD      VITAL SIGNS:  Blood pressure (!) 147/58, pulse 85, temperature 97.8 F (36.6 C), temperature source Oral, resp. rate (!) 24, height 5' 3"  (1.6 m), weight 85.3 kg (188 lb), SpO2 100 %.  PHYSICAL EXAMINATION:  VITAL SIGNS: Vitals:   08/22/16 1331 08/22/16 1424  BP: (!) 148/64 (!) 147/58  Pulse: 77 85  Resp: (!) 24 (!) 24  Temp:     GENERAL:80 y.o.female currently in no acute distress.  HEAD: Normocephalic, atraumatic.  EYES: Pupils equal, round, reactive to light. Extraocular muscles intact. No scleral icterus.  MOUTH: Moist mucosal membrane. Dentition intact. No abscess noted.  EAR, NOSE, THROAT: Clear without exudates. No external lesions.  NECK: Supple. No thyromegaly. No nodules. No JVD.  PULMONARY: Clear to ascultation, without wheeze rails or rhonci. No use of accessory muscles, Good respiratory effort. good air entry bilaterally CHEST: Nontender to palpation.  CARDIOVASCULAR: S1 and S2. Regular rate and rhythm. 3/6 systolic ejection murmur, rubs, or gallops. No edema. Pedal pulses 2+ bilaterally.  GASTROINTESTINAL: Soft, nontender, nondistended. No masses. Positive bowel sounds. No hepatosplenomegaly.  MUSCULOSKELETAL: No swelling, clubbing, or edema. Range of motion full in all extremities.  NEUROLOGIC: Cranial nerves II through XII are  intact. No gross focal neurological deficits. Sensation intact. Reflexes intact.  SKIN: No ulceration, lesions, rashes, or cyanosis. Skin warm and dry. Turgor intact.  PSYCHIATRIC: Mood, affect within normal limits. The patient is awake, alert and oriented x 3. Insight, judgment intact.    LABORATORY PANEL:   CBC  Recent Labs Lab 08/22/16 1312  WBC 8.1  HGB 8.9*  HCT 26.5*  PLT 155   ------------------------------------------------------------------------------------------------------------------  Chemistries   Recent Labs Lab 08/22/16 1312  NA 134*  K 4.2  CL 101  CO2 25  GLUCOSE 266*  BUN 34*  CREATININE 2.18*  CALCIUM 8.0*  AST 24  ALT 13*  ALKPHOS 82  BILITOT 0.7   ------------------------------------------------------------------------------------------------------------------  Cardiac Enzymes No results for input(s): TROPONINI in the last 168 hours. ------------------------------------------------------------------------------------------------------------------  RADIOLOGY:  No results found.  EKG:   Orders placed or performed during the hospital encounter of 07/09/16  . EKG 12-Lead  . EKG 12-Lead    IMPRESSION AND PLAN:   81 year old Caucasian female history of recurrent GI bleed  1. Bright red blood per rectum: Trend hemoglobin every 6 hours transfuse if less than 7, consult gastroenterology 2. Acute kidney injury: IV fluid hydration follow urine output, renal function will hold torsemide, lisinopril 3. GERD without esophagitis: ppi 4. Essential hypertension: Hold lisinopril in setting of kidney injury    All the records are reviewed and case discussed with ED provider. Management plans discussed with the patient, family and they are in agreement.  CODE STATUS: Full  TOTAL TIME TAKING CARE OF THIS PATIENT: 33 minutes.    Jazziel Fitzsimmons,  Karenann Cai.D on 08/22/2016 at 3:05 PM  Between 7am to 6pm - Pager - [phone]  After 6pm: House Pager: -  [facility]  Office  [phone]  CC: Primary care physician; Kirk Ruths., MD

## 2016-08-22 NOTE — ED Provider Notes (Signed)
[facility] Emergency Department Provider Note        Time seen: ----------------------------------------- 1:01 PM on 08/22/2016 -----------------------------------------    I have reviewed the triage vital signs and the nursing notes.   HISTORY  Chief Complaint Rectal Bleeding    HPI Morgan [name] is a 81 y.o. female who presents to ER for bloody stools since Tuesday. Patient does have a history of chronic GI bleeding and has had multiple blood transfusions over the years. She just received blood 2 days ago. Patient states the bleeding has persisted, hemoglobin was 6.8 at that time. She denies recent illness or pain. Son notes bright red blood per rectum.   Past Medical History:  Diagnosis Date  . Abnormal LFTs (liver function tests)   . Adenomatous polyps 03/05/2012  . Angiodysplasia   . Aortic stenosis   . Arthritis   . AV malformation of GI tract   . Bilateral cellulitis of lower leg   . CAD (coronary artery disease)   . Cardiomyopathy ([place])   . Carotid bruit   . Cervical cancer ([place])    cervical cancer history  . CHF (congestive heart failure) ([place])   . Chronic edema   . Clostridium difficile infection March 02, 2015  . Complication of anesthesia     I have trouble waking up "  . COPD (chronic obstructive pulmonary disease) ([place])   . Diabetes mellitus with stage 3 chronic kidney disease ([place])   . Fatty liver   . Fibrocystic breast changes   . GERD (gastroesophageal reflux disease)   . GI bleed   . Gout   . History of blood transfusion   . History of nicotine use   . Hyperlipidemia   . Hypertension   . Iron deficiency anemia 2008  . Irritable bowel syndrome   . Macrocytosis   . Osteoarthritis   . Peptic ulcer disease   . PVD (peripheral vascular disease) ([place])   . Tobacco abuse   . Trigeminal neuralgia   . Vascular malformation 06/14/1997    Patient Active Problem List   Diagnosis Date Noted  . Pressure ulcer 07/10/2016   . Thrombocytopenia ([place]) 07/09/2016  . Acute lower GI bleeding 07/09/2016  . Symptomatic anemia 07/09/2016  . Hyponatremia 07/09/2016  . AKI (acute kidney injury) ([place]) 07/09/2016  . Anemia of chronic kidney failure 06/24/2016  . Iron deficiency anemia due to chronic blood loss 04/29/2016  . Angiodysplasia 05/19/2015  . CA cervix ([place]) 05/19/2015  . Fatty infiltration of liver 05/19/2015  . Increased MCV 05/19/2015  . Gastroduodenal ulcer 05/19/2015  . Hypercholesterolemia without hypertriglyceridemia 03/06/2015  . Accumulation of fluid in tissues 12/27/2014  . Diabetes ([place]) 10/25/2014  . Absolute anemia 06/18/2014  . Aortic heart valve narrowing 06/18/2014  . 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 Arteriosclerosis of coronary artery 06/18/2014  . Cardiomyopathy ([place]) 06/18/2014  . Chronic kidney disease (CKD), stage III (moderate) 06/18/2014  . CAFL (chronic airflow limitation) ([place]) 06/18/2014  . Type II diabetes mellitus with neurological manifestations ([place]) 06/18/2014  . Gout 06/18/2014  . Benign hypertension 06/18/2014  . Fothergill's neuralgia 06/18/2014    Past Surgical History:  Procedure Laterality Date  . ABDOMINAL HYSTERECTOMY    . angiogram cardiac stenting    . BONE MARROW BIOPSY  1999  . CHOLECYSTECTOMY    . COLONOSCOPY  12/2014, 2014, 2013, 2008, 2004   03/05/2012-Adenomatous Polyps;09/23/2003-Polyp remains intact;   . COLONOSCOPY N/A 07/12/2016   Procedure: COLONOSCOPY;  Surgeon: Ronald Lobo, MD;  Location:  [place] ENDOSCOPY;  Service: Endoscopy;  Laterality: N/A;  . Endoscopic Carpal Tunnel Release    . ESOPHAGOGASTRODUODENOSCOPY  12/2014, 2014, 2013, 2009, 2004  . ESOPHAGOGASTRODUODENOSCOPY (EGD) WITH PROPOFOL N/A 07/12/2016   Procedure: ESOPHAGOGASTRODUODENOSCOPY (EGD) WITH PROPOFOL;  Surgeon: Ronald Lobo, MD;  Location: [place];  Service: Endoscopy;  Laterality: N/A;  . HOT HEMOSTASIS N/A 07/12/2016   Procedure: HOT HEMOSTASIS (ARGON PLASMA  COAGULATION/BICAP);  Surgeon: Ronald Lobo, MD;  Location[facility] ENDOSCOPY;  Service: Endoscopy;  Laterality: N/A;  . NASAL SINUS SURGERY    . TEE WITHOUT CARDIOVERSION N/A 12/19/2015   Procedure: TRANSESOPHAGEAL ECHOCARDIOGRAM (TEE);  Surgeon: Yolonda Kida, MD;  Location: [facility] ORS;  Service: Cardiovascular;  Laterality: N/A;  . THORACENTESIS    . TONSILLECTOMY      Allergies Aspirin; Atenolol; Clopidogrel bisulfate; Darvon [propoxyphene]; Hydrochlorothiazide; Iodinated diagnostic agents; Nylon; Other; and Pravastatin  Social History Social History  Substance Use Topics  . Smoking status: Former Smoker    Packs/day: 0.50    Years: 56.00    Types: Cigarettes    Quit date: 05/23/2013  . Smokeless tobacco: Never Used  . Alcohol use No    Review of Systems Constitutional: Negative for fever. Cardiovascular: Negative for chest pain. Respiratory: Negative for shortness of breath. Gastrointestinal: Negative for abdominal pain, vomiting and diarrhea.Positive for rectal bleeding Genitourinary: Negative for dysuria. Musculoskeletal: Negative for back pain. Skin: Negative for rash. Neurological: Negative for headaches, positive for weakness  10-point ROS otherwise negative.  ____________________________________________   PHYSICAL EXAM:  VITAL SIGNS: ED Triage Vitals [08/22/16 1241]  Enc Vitals Group     BP 126/61     Pulse Rate 100     Resp 20     Temp 97.8 F (36.6 C)     Temp Source Oral     SpO2 95 %     Weight 188 lb (85.3 kg)     Height _0  (1.6 m)     Head Circumference      Peak Flow      Pain Score 0     Pain Loc      Pain Edu?      Excl. in [place]?     Constitutional: Alert and oriented. Well appearing and in no distress. Eyes: Conjunctivae are Pale. PERRL. Normal extraocular movements. ENT   Head: Normocephalic and atraumatic.   Nose: No congestion/rhinnorhea.   Mouth/Throat: Mucous membranes are moist.   Neck: No stridor. Cardiovascular:  Normal rate, regular rhythm. No murmurs, rubs, or gallops. Respiratory: Normal respiratory effort without tachypnea nor retractions. Breath sounds are clear and equal bilaterally. No wheezes/rales/rhonchi. Gastrointestinal: Soft and nontender. Normal bowel sounds Musculoskeletal: Nontender with normal range of motion in all extremities. No lower extremity tenderness nor edema. Neurologic:  Normal speech and language. No gross focal neurologic deficits are appreciated.  Skin:  Skin is warm, dry and intact. Pallor is noted Psychiatric: Mood and affect are normal. Speech and behavior are normal.  ____________________________________________  ED COURSE:  Pertinent labs & imaging results that were available during my care of the patient were reviewed by me and considered in my medical decision making (see chart for details). Clinical Course  Patient presents to ER with GI bleeding and will likely need a blood transfusion. She is currently stable.  Procedures ____________________________________________   LABS (pertinent positives/negatives)  Labs Reviewed  CBC WITH DIFFERENTIAL/PLATELET - Abnormal; Notable for the following:       Result Value   RBC 2.86 (*)    Hemoglobin  8.9 (*)    HCT 26.5 (*)    RDW 15.8 (*)    Neutro Abs 6.8 (*)    Lymphs Abs 0.5 (*)    All other components within normal limits  COMPREHENSIVE METABOLIC PANEL - Abnormal; Notable for the following:    Sodium 134 (*)    Glucose, Bld 266 (*)    BUN 34 (*)    Creatinine, Ser 2.18 (*)    Calcium 8.0 (*)    Total Protein 6.0 (*)    Albumin 2.7 (*)    ALT 13 (*)    GFR calc non Af Amer 20 (*)    GFR calc Af Amer 24 (*)    All other components within normal limits  PROTIME-INR  ____________________________________________  FINAL ASSESSMENT AND PLAN  GI bleed, anemia, Acute renal insufficiency  Plan: Patient with labs as dictated above. Patient with acute renal insufficiency compared to prior, I will order IV  fluids. She will need serial hemoglobins obtained as she is still having rectal bleeding. She will need GI consult during hospitalization. She is stable for admission at this time.   Earleen Newport, MD   Note: This dictation was prepared with Dragon dictation. Any transcriptional errors that result from this process are unintentional    Earleen Newport, MD 08/22/16 1350

## 2016-08-22 NOTE — ED Notes (Signed)
Patient ambulated to room bathroom with her cane and with a steady gait.

## 2016-08-22 NOTE — Consult Note (Signed)
Lucilla Lame, MD [address] [place], [place] [address] Phone: [phone] Fax : [phone]  Consultation  Referring Provider:     No ref. provider found Primary Care Physician:  Kirk Ruths., MD Primary Gastroenterologist:  Dr. Vira Agar         Reason for Consultation:     GI bleed  Date of Admission:  08/22/2016 Date of Consultation:  08/22/2016         HPI:   Morgan [name] is a 81 y.o. female Who was recently in the hospital last month that [facility] for the same reason.  The patient had GI bleeding at that time and had a upper endoscopy and colonoscopy.  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  The patient states that shortly after being discharged she began to have bleeding again.  She states that the bleeding was bright red blood per rectum.  The patient reports that she call Dr. Vira Agar and told him of the findings and he wanted to repeat the upper endoscopy and stated that the colonoscopy would be too soon to repeat.  The patient denies any abdominal pain nausea vomiting fevers or chills.  She has been found in the past to have AVMs.  Past Medical History:  Diagnosis Date  . Abnormal LFTs (liver function tests)   . Adenomatous polyps 03/05/2012  . Angiodysplasia   . Aortic stenosis   . Arthritis   . AV malformation of GI tract   . Bilateral cellulitis of lower leg   . CAD (coronary artery disease)   . Cardiomyopathy ([place])   . Carotid bruit   . Cervical cancer ([place])    cervical cancer history  . CHF (congestive heart failure) ([place])   . Chronic edema   . Clostridium difficile infection March 02, 2015  . Complication of anesthesia     I have trouble waking up "  . COPD (chronic obstructive pulmonary disease) ([place])   . Diabetes mellitus with stage 3 chronic kidney disease ([place])   . Fatty liver   . Fibrocystic breast changes   . GERD (gastroesophageal reflux disease)   . GI bleed   . Gout   . History of  blood transfusion   . History of nicotine use   . Hyperlipidemia   . Hypertension   . Iron deficiency anemia 2008  . Irritable bowel syndrome   . Macrocytosis   . Osteoarthritis   . Peptic ulcer disease   . PVD (peripheral vascular disease) ([place])   . Tobacco abuse   . Trigeminal neuralgia   . Vascular malformation 06/14/1997    Past Surgical History:  Procedure Laterality Date  . ABDOMINAL HYSTERECTOMY    . angiogram cardiac stenting    . BONE MARROW BIOPSY  1999  . CHOLECYSTECTOMY    . COLONOSCOPY  12/2014, 2014, 2013, 2008, 2004   03/05/2012-Adenomatous Polyps;09/23/2003-Polyp remains intact;   . COLONOSCOPY N/A 07/12/2016   Procedure: COLONOSCOPY;  Surgeon: Ronald Lobo, MD;  Location: [facility] ENDOSCOPY;  Service: Endoscopy;  Laterality: N/A;  . Endoscopic Carpal Tunnel Release    . ESOPHAGOGASTRODUODENOSCOPY  12/2014, 2014, 2013, 2009, 2004  . ESOPHAGOGASTRODUODENOSCOPY (EGD) WITH PROPOFOL N/A 07/12/2016   Procedure: ESOPHAGOGASTRODUODENOSCOPY (EGD) WITH PROPOFOL;  Surgeon: Ronald Lobo, MD;  Location: [place];  Service: Endoscopy;  Laterality: N/A;  . HOT HEMOSTASIS N/A 07/12/2016   Procedure: HOT HEMOSTASIS (ARGON PLASMA COAGULATION/BICAP);  Surgeon: Ronald Lobo, MD;  Location: [facility] ENDOSCOPY;  Service: Endoscopy;  Laterality: N/A;  . NASAL SINUS SURGERY    . TEE WITHOUT CARDIOVERSION N/A 12/19/2015   Procedure: TRANSESOPHAGEAL ECHOCARDIOGRAM (TEE);  Surgeon: Yolonda Kida, MD;  Location: [facility] ORS;  Service: Cardiovascular;  Laterality: N/A;  . THORACENTESIS    . TONSILLECTOMY      Prior to Admission medications   Medication Sig Start Date End Date Taking? Authorizing Provider  acetaminophen (TYLENOL) 500 MG tablet Take 500 mg by mouth at bedtime.   Yes Historical Provider, MD  ciprofloxacin (CIPRO) 500 MG tablet Take 1 tablet (500 mg total) by mouth 2 (two) times daily. 07/13/16  Yes Kelvin Cellar, MD  lisinopril (PRINIVIL,ZESTRIL) 40 MG tablet Take 40 mg by  mouth daily.   Yes Historical Provider, MD  metoprolol succinate (TOPROL-XL) 50 MG 24 hr tablet Take 50 mg by mouth daily.    Yes Historical Provider, MD  pantoprazole (PROTONIX) 40 MG tablet Take 40 mg by mouth daily.   Yes Historical Provider, MD  simvastatin (ZOCOR) 20 MG tablet Take 20 mg by mouth at bedtime.    Yes Historical Provider, MD  torsemide (DEMADEX) 20 MG tablet Take 1 tablet by mouth 4 (four) times daily.    Yes Historical Provider, MD    Family History  Problem Relation Age of Onset  . Parkinsonism Mother   . Heart attack Mother   . Lung cancer Father   . Diabetes Mellitus II Father   . Asthma Sister   . Asthma Brother   . Heart disease Son      Social History  Substance Use Topics  . Smoking status: Former Smoker    Packs/day: 0.50    Years: 56.00    Types: Cigarettes    Quit date: 05/23/2013  . Smokeless tobacco: Never Used  . Alcohol use No    Allergies as of 08/22/2016 - Review Complete 08/22/2016  Allergen Reaction Noted  . Aspirin Other (See Comments) 11/08/2015  . Atenolol Other (See Comments) 05/19/2015  . Clopidogrel bisulfate Other (See Comments) 11/08/2015  . Darvon [propoxyphene] Hives and Other (See Comments) 04/25/2015  . Hydrochlorothiazide Other (See Comments) 05/19/2015  . Iodinated diagnostic agents Other (See Comments) 05/19/2015  . Nylon Hives 04/25/2015  . Other Other (See Comments) 05/19/2015  . Pravastatin Other (See Comments) 05/19/2015    Review of Systems:    All systems reviewed and negative except where noted in HPI.   Physical Exam:  Vital signs in last 24 hours: Temp:  [97.8 F (36.6 C)-98.2 F (36.8 C)] 98.2 F (36.8 C) (08/31 1553) Pulse Rate:  [77-100] 85 (08/31 1553) Resp:  [18-24] 18 (08/31 1553) BP: (126-162)/(58-70) 162/59 (08/31 1553) SpO2:  [95 %-100 %] 100 % (08/31 1553) Weight:  [185 lb 12.8 oz (84.3 kg)-188 lb (85.3 kg)] 185 lb 12.8 oz (84.3 kg) (08/31 1553) Last BM Date: 08/22/16 General:   Pleasant,  cooperative in NAD Head:  Normocephalic and atraumatic. Eyes:   No icterus.   Conjunctiva pink. PERRLA. Ears:  Normal auditory acuity. Neck:  Supple; no masses or thyroidomegaly Lungs: Respirations even and unlabored. Lungs clear to auscultation bilaterally.   No wheezes, crackles, or rhonchi.  Heart:  Regular rate and rhythm;  Without murmur, clicks, rubs or gallops Abdomen:  Soft, nondistended, nontender. Normal bowel sounds. No appreciable masses or hepatomegaly.  No rebound or guarding.  Rectal:  Not performed. Msk:  Symmetrical without gross deformities.   Extremities:  Without edema, cyanosis or clubbing. Neurologic:  Alert and oriented x3;  grossly normal neurologically.  Skin:  Intact without significant lesions or rashes. Cervical Nodes:  No significant cervical adenopathy. Psych:  Alert and cooperative. Normal affect.  LAB RESULTS:  Recent Labs  08/22/16 1312 08/22/16 1724  WBC 8.1 5.4  HGB 8.9* 8.1*  HCT 26.5* 23.7*  PLT 155 129*   BMET  Recent Labs  08/22/16 1312  NA 134*  K 4.2  CL 101  CO2 25  GLUCOSE 266*  BUN 34*  CREATININE 2.18*  CALCIUM 8.0*   LFT  Recent Labs  08/22/16 1312  PROT 6.0*  ALBUMIN 2.7*  AST 24  ALT 13*  ALKPHOS 82  BILITOT 0.7   PT/INR  Recent Labs  08/22/16 1312  LABPROT 13.2  INR 1.00    STUDIES: No results found.    Impression / Plan:   Morgan [name] is a 81 y.o. y/o female with a history of AVMs in her intestines.  The patient recently had an upper endoscopy and colonoscopy.  The patient had AVMs treated in the stomach and colon.  The patient reports that when she went home she continued to have rectal bleeding throughout the time she was home and up until the time she came in.  The patient was found to have anemia and was admitted for evaluation.  The patient will be prepped today and kept nothing by mouth after midnight for an EGD and colonoscopy for tomorrow. I have discussed risks & benefits which include,  but are not limited to, bleeding, infection, perforation & drug reaction.  The patient agrees with this plan & written consent will be obtained.     Thank you for involving me in the care of this patient.      LOS: 0 days   Lucilla Lame, MD  08/22/2016, 7:14 PM   Note: This dictation was prepared with Dragon dictation along with smaller phrase technology. Any transcriptional errors that result from this process are unintentional.

## 2016-08-22 NOTE — Progress Notes (Signed)
Blood bank verified type and screen completed in ED and does not need to be done again. Blue Blood bracelet on patient ([id number]).

## 2016-08-22 NOTE — Progress Notes (Signed)
Moderate fall risk (13). Patient reports weakness bilateral legs, dizziness, fluctuating Hgb, cane assistive device, IVF infusing.  I explained to patient I would prefer the bed alarm on and calling for assistance to [facility] but she refused that she will do either stating "I can do it myself and can't wait on anyone to come". Safe environment provided.

## 2016-08-23 ENCOUNTER — Observation Stay: Payer: Medicare Other | Admitting: Anesthesiology

## 2016-08-23 ENCOUNTER — Encounter
Admission: EM | Disposition: A | Discharge status: Home / Self Care | Payer: Self-pay | Source: Home / Self Care | Attending: Emergency Medicine

## 2016-08-23 ENCOUNTER — Encounter: Payer: Self-pay | Admitting: *Deleted

## 2016-08-23 DIAGNOSIS — K921 Melena: Secondary | ICD-10-CM | POA: Diagnosis not present

## 2016-08-23 DIAGNOSIS — K5521 Angiodysplasia of colon with hemorrhage: Secondary | ICD-10-CM

## 2016-08-23 DIAGNOSIS — D62 Acute posthemorrhagic anemia: Secondary | ICD-10-CM | POA: Diagnosis not present

## 2016-08-23 DIAGNOSIS — D126 Benign neoplasm of colon, unspecified: Secondary | ICD-10-CM | POA: Diagnosis not present

## 2016-08-23 DIAGNOSIS — D509 Iron deficiency anemia, unspecified: Secondary | ICD-10-CM

## 2016-08-23 HISTORY — PX: COLONOSCOPY: SHX5424

## 2016-08-23 HISTORY — PX: ESOPHAGOGASTRODUODENOSCOPY (EGD) WITH PROPOFOL: SHX5813

## 2016-08-23 LAB — CBC
HCT: 24.1 % — ABNORMAL LOW (ref 35.0–47.0)
HEMATOCRIT: 21.5 % — AB (ref 35.0–47.0)
HEMATOCRIT: 24 % — AB (ref 35.0–47.0)
HEMOGLOBIN: 7.2 g/dL — AB (ref 12.0–16.0)
HEMOGLOBIN: 8.3 g/dL — AB (ref 12.0–16.0)
Hemoglobin: 8.3 g/dL — ABNORMAL LOW (ref 12.0–16.0)
MCH: 31.1 pg (ref 26.0–34.0)
MCH: 31.3 pg (ref 26.0–34.0)
MCH: 31.6 pg (ref 26.0–34.0)
MCHC: 34.2 g/dL (ref 32.0–36.0)
MCHC: 33.3 g/dL (ref 32.0–36.0)
MCHC: 34.6 g/dL (ref 32.0–36.0)
MCV: 91.3 fL (ref 80.0–100.0)
MCV: 91.7 fL (ref 80.0–100.0)
MCV: 93.2 fL (ref 80.0–100.0)
PLATELETS: 142 10*3/uL — AB (ref 150–440)
Platelets: 115 10*3/uL — ABNORMAL LOW (ref 150–440)
Platelets: 118 10*3/uL — ABNORMAL LOW (ref 150–440)
RBC: 2.3 MIL/uL — AB (ref 3.80–5.20)
RBC: 2.63 MIL/uL — AB (ref 3.80–5.20)
RBC: 2.63 MIL/uL — ABNORMAL LOW (ref 3.80–5.20)
RDW: 15.9 % — ABNORMAL HIGH (ref 11.5–14.5)
RDW: 16.2 % — AB (ref 11.5–14.5)
RDW: 16.7 % — ABNORMAL HIGH (ref 11.5–14.5)
WBC: 4.2 10*3/uL (ref 3.6–11.0)
WBC: 6 10*3/uL (ref 3.6–11.0)
WBC: 7 10*3/uL (ref 3.6–11.0)

## 2016-08-23 LAB — BASIC METABOLIC PANEL
Anion gap: 7 (ref 5–15)
BUN: 29 mg/dL — AB (ref 6–20)
CO2: 26 mmol/L (ref 22–32)
Calcium: 7.7 mg/dL — ABNORMAL LOW (ref 8.9–10.3)
Chloride: 106 mmol/L (ref 101–111)
Creatinine, Ser: 2.02 mg/dL — ABNORMAL HIGH (ref 0.44–1.00)
GFR calc Af Amer: 26 mL/min — ABNORMAL LOW (ref >60)
GFR, EST NON AFRICAN AMERICAN: 22 mL/min — AB (ref >60)
Glucose, Bld: 177 mg/dL — ABNORMAL HIGH (ref 65–99)
POTASSIUM: 3.8 mmol/L (ref 3.5–5.1)
SODIUM: 139 mmol/L (ref 135–145)

## 2016-08-23 LAB — GLUCOSE, CAPILLARY: Glucose-Capillary: 167 mg/dL — ABNORMAL HIGH (ref 65–99)

## 2016-08-23 LAB — PREPARE RBC (CROSSMATCH)

## 2016-08-23 SURGERY — ESOPHAGOGASTRODUODENOSCOPY (EGD) WITH PROPOFOL
Anesthesia: Monitor Anesthesia Care

## 2016-08-23 SURGERY — ESOPHAGOGASTRODUODENOSCOPY (EGD) WITH PROPOFOL
Anesthesia: General

## 2016-08-23 MED ORDER — PROPOFOL 10 MG/ML IV BOLUS
INTRAVENOUS | Status: DC | PRN
  Administered 2016-08-23: 10 mg via INTRAVENOUS
  Administered 2016-08-23: 30 mg via INTRAVENOUS
  Administered 2016-08-23: 10 mg via INTRAVENOUS

## 2016-08-23 MED ORDER — LIDOCAINE HCL (CARDIAC) 20 MG/ML IV SOLN
INTRAVENOUS | Status: DC | PRN
  Administered 2016-08-23: 100 mg via INTRATRACHEAL

## 2016-08-23 MED ORDER — PROPOFOL 500 MG/50ML IV EMUL
INTRAVENOUS | Status: DC | PRN
  Administered 2016-08-23: 100 ug/kg/min via INTRAVENOUS

## 2016-08-23 MED ORDER — SODIUM CHLORIDE 0.9 % IV SOLN
Freq: Once | INTRAVENOUS | Status: AC
  Administered 2016-08-23: 18:00:00 via INTRAVENOUS

## 2016-08-23 MED ORDER — PHENYLEPHRINE HCL 10 MG/ML IJ SOLN
INTRAMUSCULAR | Status: DC | PRN
  Administered 2016-08-23 (×2): 100 ug via INTRAVENOUS
  Administered 2016-08-23: 150 ug via INTRAVENOUS
  Administered 2016-08-23 (×3): 100 ug via INTRAVENOUS
  Administered 2016-08-23: 50 ug via INTRAVENOUS

## 2016-08-23 MED ORDER — INSULIN ASPART 100 UNIT/ML ~~LOC~~ SOLN: 0.0000 [IU] | SUBCUTANEOUS | Status: DC

## 2016-08-23 MED ORDER — LISINOPRIL 20 MG PO TABS
40.0000 mg | ORAL_TABLET | Freq: Every day | ORAL | Status: DC
  Administered 2016-08-24: 40 mg via ORAL
  Filled 2016-08-23: qty 2

## 2016-08-23 NOTE — Anesthesia Postprocedure Evaluation (Signed)
Anesthesia Post Note  Patient: Morgan [name]  Procedure(s) Performed: Procedure(s) (LRB): ESOPHAGOGASTRODUODENOSCOPY (EGD) WITH PROPOFOL (N/A) COLONOSCOPY (N/A)  Patient location during evaluation: Endoscopy Anesthesia Type: General Level of consciousness: awake and alert Pain management: pain level controlled Vital Signs Assessment: post-procedure vital signs reviewed and stable Respiratory status: spontaneous breathing, nonlabored ventilation and respiratory function stable Cardiovascular status: blood pressure returned to baseline and stable Postop Assessment: no signs of nausea or vomiting Anesthetic complications: no    Last Vitals:  Vitals:   08/23/16 1419 08/23/16 1429  BP: (!) 160/58 (!) 175/62  Pulse: 94 (!) 102  Resp: (!) 22 19  Temp:      Last Pain:  Vitals:   08/23/16 1359  TempSrc: Oral  PainSc:                  Melony Tenpas

## 2016-08-23 NOTE — Anesthesia Preprocedure Evaluation (Signed)
Anesthesia Evaluation  Patient identified by MRN, date of birth, ID band Patient awake    Reviewed: Allergy & Precautions, NPO status , Patient's Chart, lab work & pertinent test results  History of Anesthesia Complications History of anesthetic complications: patient reports slow to wake up.  Airway Mallampati: II  TM Distance: >3 FB Neck ROM: Full    Dental  (+) Upper Dentures, Poor Dentition, Missing   Pulmonary COPD, former smoker,    breath sounds clear to auscultation- rhonchi (-) wheezing      Cardiovascular hypertension, + CAD, + Cardiac Stents, + Peripheral Vascular Disease and +CHF   Rhythm:Regular Rate:Normal - Systolic murmurs and - Diastolic murmurs Echo 25/0539: - Left ventricle: Systolic function was normal. The estimated   ejection fraction was in the range of 55% to 60%. - Aortic valve: There was moderate stenosis. - Mitral valve: Prolapse. There was moderate regurgitation. - Left atrium: The atrium was dilated. - Right atrium: The atrium was dilated. No evidence of thrombus in   the appendage. - Atrial septum: No defect or patent foramen ovale was identified. - Tricuspid valve: There was moderate regurgitation.   Neuro/Psych    GI/Hepatic PUD, GERD  ,  Endo/Other  diabetes, Insulin Dependent  Renal/GU Renal disease     Musculoskeletal  (+) Arthritis , Osteoarthritis,    Abdominal (+) + obese,   Peds  Hematology  (+) anemia ,   Anesthesia Other Findings Past Medical History: No date: Abnormal LFTs (liver function tests) 03/05/2012: Adenomatous polyps No date: Angiodysplasia No date: Aortic stenosis No date: Arthritis No date: AV malformation of GI tract No date: Bilateral cellulitis of lower leg No date: CAD (coronary artery disease) No date: Cardiomyopathy [facility]) No date: Carotid bruit No date: Cervical cancer ([facility])     Comment: cervical cancer history No date: CHF (congestive heart  failure) ([facility]) No date: Chronic edema March 02, 2015: Clostridium difficile infection No date: Complication of anesthesia     Comment:  I have trouble waking up " No date: COPD (chronic obstructive pulmonary disease) (* No date: Diabetes mellitus with stage 3 chronic kidney * No date: Fatty liver No date: Fibrocystic breast changes No date: GERD (gastroesophageal reflux disease) No date: GI bleed No date: Gout No date: History of blood transfusion No date: History of nicotine use No date: Hyperlipidemia No date: Hypertension 2008: Iron deficiency anemia No date: Irritable bowel syndrome No date: Macrocytosis No date: Osteoarthritis No date: Peptic ulcer disease No date: PVD (peripheral vascular disease) ([place]) No date: Tobacco abuse No date: Trigeminal neuralgia 06/14/1997: Vascular malformation   Reproductive/Obstetrics                             Anesthesia Physical Anesthesia Plan  ASA: IV  Anesthesia Plan: General   Post-op Pain Management:    Induction: Intravenous  Airway Management Planned: Natural Airway  Additional Equipment:   Intra-op Plan:   Post-operative Plan:   Informed Consent: I have reviewed the patients History and Physical, chart, labs and discussed the procedure including the risks, benefits and alternatives for the proposed anesthesia with the patient or authorized representative who has indicated his/her understanding and acceptance.   Dental advisory given  Plan Discussed with: Anesthesiologist and CRNA  Anesthesia Plan Comments:         Anesthesia Quick Evaluation

## 2016-08-23 NOTE — Op Note (Signed)
[facility] Gastroenterology Patient Name: Morgan [name] Procedure Date: 08/23/2016 1:07 PM MRN: [id number] Account #: [id number] Date of Birth: 07/08/1935 Admit Type: Inpatient Age: 81 Room: [facility] ENDO ROOM 1 Gender: Female Note Status: Finalized Procedure:            Upper GI endoscopy Indications:          Acute post hemorrhagic anemia, Iron deficiency anemia Providers:            Lucilla Lame MD, MD Referring MD:         Ocie Cornfield. Ouida Sills MD, MD (Referring MD) Medicines:            Propofol per Anesthesia Complications:        No immediate complications. Procedure:            Pre-Anesthesia Assessment:                       - Prior to the procedure, a History and Physical was                        performed, and patient medications and allergies were                        reviewed. The patient's tolerance of previous                        anesthesia was also reviewed. The risks and benefits of                        the procedure and the sedation options and risks were                        discussed with the patient. All questions were                        answered, and informed consent was obtained. Prior                        Anticoagulants: The patient has taken no previous                        anticoagulant or antiplatelet agents. ASA Grade                        Assessment: IV - A patient with severe systemic disease                        that is a constant threat to life. After reviewing the                        risks and benefits, the patient was deemed in                        satisfactory condition to undergo the procedure.                       After obtaining informed consent, the endoscope was                        passed under direct vision. Throughout the  procedure,                        the patient's blood pressure, pulse, and oxygen                        saturations were monitored continuously. The Endoscope      was introduced through the mouth, and advanced to the                        second part of duodenum. The upper GI endoscopy was                        accomplished without difficulty. The patient tolerated                        the procedure well. Findings:      The examined esophagus was normal.      The entire examined stomach was normal.      The examined duodenum was normal. Impression:           - Normal esophagus.                       - Normal stomach.                       - Normal examined duodenum.                       - No specimens collected. Recommendation:       - Resume previous diet.                       - Continue present medications. Procedure Code(s):    --- Professional ---                       [phone], Esophagogastroduodenoscopy, flexible, transoral;                        diagnostic, including collection of specimen(s) by                        brushing or washing, when performed (separate procedure) Diagnosis Code(s):    --- Professional ---                       D50.9, Iron deficiency anemia, unspecified                       D62, Acute posthemorrhagic anemia CPT copyright 2016 American Medical Association. All rights reserved. The codes documented in this report are preliminary and upon coder review may  be revised to meet current compliance requirements. Lucilla Lame MD, MD 08/23/2016 1:25:10 PM This report has been signed electronically. Number of Addenda: 0 Note Initiated On: 08/23/2016 1:07 PM      [facility]

## 2016-08-23 NOTE — Transfer of Care (Signed)
Immediate Anesthesia Transfer of Care Note  Patient: Morgan [name]  Procedure(s) Performed: Procedure(s): ESOPHAGOGASTRODUODENOSCOPY (EGD) WITH PROPOFOL (N/A) COLONOSCOPY (N/A)  Patient Location: PACU  Anesthesia Type:General  Level of Consciousness: awake  Airway & Oxygen Therapy: Patient Spontanous Breathing and Patient connected to nasal cannula oxygen  Post-op Assessment: Report given to RN and Post -op Vital signs reviewed and stable  Post vital signs: Reviewed and stable  Last Vitals:  Vitals:   08/23/16 1302 08/23/16 1359  BP: (!) 150/60 (!) 142/57  Pulse: 98 100  Resp: 16 18  Temp: 37.3 C 36.5 C    Last Pain:  Vitals:   08/23/16 1359  TempSrc: Oral  PainSc:          Complications: No apparent anesthesia complications

## 2016-08-23 NOTE — Care Management Obs Status (Signed)
[place] NOTIFICATION   Patient Details  Name: Morgan [name] MRN: [id number] Date of Birth: [birth year]   Medicare Observation Status Notification Given:  Yes    Shelbie Ammons, RN 08/23/2016, 10:00 AM

## 2016-08-23 NOTE — Progress Notes (Signed)
[place] [facility] [place] NAME: Morgan [name]    MR#:  [id number]  DATE OF BIRTH:  1935-03-07  SUBJECTIVE:  CHIEF COMPLAINT:   Chief Complaint  Patient presents with  . Rectal Bleeding   Complains of weakness. Had nausea and vomiting with GoLYTELY.  No further bleeding today.  REVIEW OF SYSTEMS:    Review of Systems  Constitutional: Positive for malaise/fatigue. Negative for chills and fever.  HENT: Negative for sore throat.   Eyes: Negative for blurred vision, double vision and pain.  Respiratory: Negative for cough, hemoptysis, shortness of breath and wheezing.   Cardiovascular: Negative for chest pain, palpitations, orthopnea and leg swelling.  Gastrointestinal: Positive for blood in stool, nausea and vomiting. Negative for abdominal pain, constipation, diarrhea and heartburn.  Genitourinary: Negative for dysuria and hematuria.  Musculoskeletal: Negative for back pain and joint pain.  Skin: Negative for rash.  Neurological: Positive for weakness. Negative for sensory change, speech change, focal weakness and headaches.  Endo/Heme/Allergies: Does not bruise/bleed easily.  Psychiatric/Behavioral: Negative for depression. The patient is not nervous/anxious.     DRUG ALLERGIES:   Allergies  Allergen Reactions  . Aspirin Other (See Comments)    bleeding  . Atenolol Other (See Comments)    fatigue  . Clopidogrel Bisulfate Other (See Comments)    bleeding  . Darvon [Propoxyphene] Hives and Other (See Comments)  . Hydrochlorothiazide Other (See Comments)    Fatigue and cramps  . Iodinated Diagnostic Agents Other (See Comments)    Questionable GI bleed  . Nylon Hives  . Other Other (See Comments)    Nylon sutures  . Pravastatin Other (See Comments)    Severe cramping    VITALS:  Blood pressure (!) 160/50, pulse (!) 111, temperature 98.2 F (36.8 C), temperature source Oral, resp. rate 20, height '5\' 3"'$  (1.6 m), weight  84.3 kg (185 lb 12.8 oz), SpO2 97 %.  PHYSICAL EXAMINATION:   Physical Exam  GENERAL:  81 y.o.-year-old patient lying in the bed with no acute distress.  EYES: Pupils equal, round, reactive to light and accommodation. No scleral icterus. Extraocular muscles intact.Pallor positive  HEENT: Head atraumatic, normocephalic. Oropharynx and nasopharynx clear.  NECK:  Supple, no jugular venous distention. No thyroid enlargement, no tenderness.  LUNGS: Normal breath sounds bilaterally, no wheezing, rales, rhonchi. No use of accessory muscles of respiration.  CARDIOVASCULAR: S1, S2 normal. No murmurs, rubs, or gallops.  ABDOMEN: Soft, nontender, nondistended. Bowel sounds present. No organomegaly or mass.  EXTREMITIES: No cyanosis, clubbing or edema b/l.    NEUROLOGIC: Cranial nerves II through XII are intact. No focal Motor or sensory deficits b/l.   PSYCHIATRIC: The patient is alert and oriented x 3.  SKIN: No obvious rash, lesion, or ulcer.   LABORATORY PANEL:   CBC  Recent Labs Lab 08/23/16 0640  WBC 4.2  HGB 7.2*  HCT 21.5*  PLT 115*   ------------------------------------------------------------------------------------------------------------------ Chemistries   Recent Labs Lab 08/22/16 1312 08/23/16 0640  NA 134* 139  K 4.2 3.8  CL 101 106  CO2 25 26  GLUCOSE 266* 177*  BUN 34* 29*  CREATININE 2.18* 2.02*  CALCIUM 8.0* 7.7*  AST 24  --   ALT 13*  --   ALKPHOS 82  --   BILITOT 0.7  --    ------------------------------------------------------------------------------------------------------------------  Cardiac Enzymes No results for input(s): TROPONINI in the last 168 hours. ------------------------------------------------------------------------------------------------------------------  RADIOLOGY:  No results found.   ASSESSMENT AND PLAN:  81 year old Caucasian female history of recurrent GI bleed  1. Bright red blood per rectum With acute blood loss  anemia Transfuse as hemoglobin is 7.2 with GI bleeding Ordered 1 unit packed RBC Repeat labs after transfusion. Scheduled for EGD and colonoscopy later today.  2. CKD stage III Close to normal. On IV fluids.  3. GERD without esophagitis: ppi  4. Essential hypertension Lisinopril  5. DVT prophylaxis with SCDs   All the records are reviewed and case discussed with Care Management/Social Workerr. Management plans discussed with the patient, family and they are in agreement.  CODE STATUS: FULL CODE  DVT Prophylaxis: SCDs  TOTAL TIME TAKING CARE OF THIS PATIENT: 30 minutes.   POSSIBLE D/C IN 1-2 DAYS, DEPENDING ON CLINICAL CONDITION.  Hillary Bow R M.D on 08/23/2016 at 12:20 PM  Between 7am to 6pm - Pager - [phone]  After 6pm go to [email or web address] - password [email or web address] [facility]  Office  [phone]  CC: Primary care physician; Kirk Ruths., MD  Note: This dictation was prepared with Dragon dictation along with smaller phrase technology. Any transcriptional errors that result from this process are unintentional.

## 2016-08-23 NOTE — Anesthesia Procedure Notes (Signed)
Date/Time: 08/23/2016 1:15 PM Performed by: Allean Found Pre-anesthesia Checklist: Patient identified, Emergency Drugs available, Suction available, Patient being monitored and Timeout performed Patient Re-evaluated:Patient Re-evaluated prior to inductionOxygen Delivery Method: Nasal cannula Intubation Type: IV induction

## 2016-08-23 NOTE — Progress Notes (Signed)
Inpatient Diabetes Program Recommendations  AACE/ADA: New Consensus Statement on Inpatient Glycemic Control (2015)  Target Ranges:  Prepandial:   less than 140 mg/dL      Peak postprandial:   less than 180 mg/dL (1-2 hours)      Critically ill patients:  140 - 180 mg/dL   Results for KEMA, SANTAELLA (MRN [id number]) as of 08/23/2016 09:27  Ref. Range 08/22/2016 13:12 08/23/2016 06:40  Glucose Latest Ref Range: 65 - 99 mg/dL 266 (H) 177 (H)   Review of Glycemic Control  Diabetes history: DM2 Outpatient Diabetes medications: None Current orders for Inpatient glycemic control: None  Inpatient Diabetes Program Recommendations: Correction (SSI): Please consider ordering CBGs with Novolog sensitive correction scale Q4H (change to ACHS once diet is resumed).  Thanks, Barnie Alderman, RN, MSN, CDE Diabetes Coordinator Inpatient Diabetes Program [phone] (Team Pager from [place] to [place]) [phone] ([facility] office) [phone] [facility] office) [phone] [facility] office)

## 2016-08-23 NOTE — Op Note (Signed)
[facility] Gastroenterology Patient Name: Morgan [name] Procedure Date: 08/23/2016 1:06 PM MRN: [id number] Account #: [id number] Date of Birth: 01/25/1935 Admit Type: Inpatient Age: 81 Room: [facility] ENDO ROOM 1 Gender: Female Note Status: Finalized Procedure:            Colonoscopy Indications:          Hematochezia Providers:            Lucilla Lame MD, MD Referring MD:         Ocie Cornfield. Ouida Sills MD, MD (Referring MD) Medicines:            Propofol per Anesthesia Complications:        No immediate complications. Procedure:            Pre-Anesthesia Assessment:                       - Prior to the procedure, a History and Physical was                        performed, and patient medications and allergies were                        reviewed. The patient's tolerance of previous                        anesthesia was also reviewed. The risks and benefits of                        the procedure and the sedation options and risks were                        discussed with the patient. All questions were                        answered, and informed consent was obtained. Prior                        Anticoagulants: The patient has taken no previous                        anticoagulant or antiplatelet agents. ASA Grade                        Assessment: IV - A patient with severe systemic disease                        that is a constant threat to life. After reviewing the                        risks and benefits, the patient was deemed in                        satisfactory condition to undergo the procedure.                       After obtaining informed consent, the colonoscope was                        passed under direct vision. Throughout the procedure,  the patient's blood pressure, pulse, and oxygen                        saturations were monitored continuously. The                        Colonoscope was introduced through the anus and                      advanced to the the cecum, identified by appendiceal                        orifice and ileocecal valve. The colonoscopy was                        performed without difficulty. The patient tolerated the                        procedure well. The quality of the bowel preparation                        was good. Findings:      The perianal and digital rectal examinations were normal.      Many sessile polyps were found in the entire colon. Polypectomy was not       attempted.      Three medium-sized patchy angiodysplastic lesions without bleeding were       found in the cecum. Coagulation for bleeding prevention using argon beam       at 2 liters/minute and 30 watts was successful.      One large patchy angioectasia with bleeding was found in the ascending       colon. Coagulation for hemostasis using argon beam at 2 liters/minute       and 30 watts was successful. For hemostasis, two hemostatic clips were       successfully placed (MR conditional). There was no bleeding at the end       of the procedure.      Non-bleeding internal hemorrhoids were found during retroflexion. The       hemorrhoids were Grade II (internal hemorrhoids that prolapse but reduce       spontaneously). Impression:           - Many polyps in the entire colon. Resection not                        attempted.                       - Three non-bleeding colonic angiodysplastic lesions.                        Treated with argon beam coagulation.                       - One bleeding colonic angioectasia. Treated with argon                        beam coagulation. Clips (MR conditional) were placed.                       - Non-bleeding internal hemorrhoids.                       -  No specimens collected. Recommendation:       - Resume previous diet.                       - Continue present medications.                       - Repeat colonoscopy to remove polyps. By Dr. Vira Agar. Procedure Code(s):     --- Professional ---                       [phone], Colonoscopy, flexible; with control of bleeding,                        any method Diagnosis Code(s):    --- Professional ---                       K92.1, Melena (includes Hematochezia)                       D12.6, Benign neoplasm of colon, unspecified                       K55.21, Angiodysplasia of colon with hemorrhage CPT copyright 2016 American Medical Association. All rights reserved. The codes documented in this report are preliminary and upon coder review may  be revised to meet current compliance requirements. Lucilla Lame MD, MD 08/23/2016 1:48:59 PM This report has been signed electronically. Number of Addenda: 0 Note Initiated On: 08/23/2016 1:06 PM Scope Withdrawal Time: 0 hours 6 minutes 22 seconds  Total Procedure Duration: 0 hours 17 minutes 12 seconds       [facility]

## 2016-08-23 NOTE — Care Management (Signed)
Admitted to [facility] under observation status with the diagnosis of GI Bleed. Lives with son, [name] [id number]). No home health. No skilled facility. No home oxygen. Uses a cane to aid in ambulation, furniture walks in the home. Takes care of all basic activities herself, gave up driving serveral years ago. Decreased appetite for a long time due to broken teeth. (Needs oral surgery). No falls lately. Prescriptions are filled at [facility] in [place] to [facility] for anemic problems every Monday. Scheduled for Coloscopy/Endoscopy today Shelbie Ammons RN MSN [facility] Care Manager [phone]

## 2016-08-24 DIAGNOSIS — K921 Melena: Secondary | ICD-10-CM | POA: Diagnosis not present

## 2016-08-24 LAB — TYPE AND SCREEN
ABO/RH(D): B POS
ANTIBODY SCREEN: NEGATIVE
UNIT DIVISION: 0

## 2016-08-24 LAB — HEMOGLOBIN: HEMOGLOBIN: 8.9 g/dL — AB (ref 12.0–16.0)

## 2016-08-24 MED ORDER — AMLODIPINE BESYLATE 5 MG PO TABS
2.5000 mg | ORAL_TABLET | Freq: Every day | ORAL | Status: DC
  Administered 2016-08-24: 2.5 mg via ORAL
  Filled 2016-08-24: qty 1

## 2016-08-24 MED ORDER — AMLODIPINE BESYLATE 2.5 MG PO TABS: 2.5000 mg | ORAL_TABLET | Freq: Every day | ORAL | 0 refills | Status: DC

## 2016-08-24 MED ORDER — HEPARIN SOD (PORK) LOCK FLUSH 100 UNIT/ML IV SOLN
500.0000 [IU] | Freq: Once | INTRAVENOUS | Status: AC
  Administered 2016-08-24: 500 [IU] via INTRAVENOUS
  Filled 2016-08-24: qty 5

## 2016-08-24 MED ORDER — TORSEMIDE 20 MG PO TABS
40.0000 mg | ORAL_TABLET | Freq: Once | ORAL | Status: DC
  Filled 2016-08-24: qty 2

## 2016-08-24 NOTE — Progress Notes (Signed)
Patient refuses bed alarm. Patient educated on purpose of bed alarm and reminded to call if getting out of bed.

## 2016-08-24 NOTE — Discharge Instructions (Signed)
Resume diet and activity as before.  Follow up at cancer center with Dr. Rogue Bussing for repeat blood work and iron infusion

## 2016-08-24 NOTE — Progress Notes (Signed)
Patient discharged home per MD order. All discharge instructions given and all questions answered. 

## 2016-08-27 ENCOUNTER — Inpatient Hospital Stay: Payer: Medicare Other | Attending: Internal Medicine

## 2016-08-27 ENCOUNTER — Encounter: Payer: Self-pay | Admitting: Gastroenterology

## 2016-08-27 DIAGNOSIS — D631 Anemia in chronic kidney disease: Secondary | ICD-10-CM | POA: Diagnosis not present

## 2016-08-27 DIAGNOSIS — N184 Chronic kidney disease, stage 4 (severe): Secondary | ICD-10-CM | POA: Diagnosis not present

## 2016-08-27 DIAGNOSIS — Z79899 Other long term (current) drug therapy: Secondary | ICD-10-CM | POA: Diagnosis not present

## 2016-08-27 DIAGNOSIS — I130 Hypertensive heart and chronic kidney disease with heart failure and stage 1 through stage 4 chronic kidney disease, or unspecified chronic kidney disease: Secondary | ICD-10-CM | POA: Diagnosis present

## 2016-08-27 DIAGNOSIS — N183 Chronic kidney disease, stage 3 unspecified: Secondary | ICD-10-CM

## 2016-08-27 DIAGNOSIS — D509 Iron deficiency anemia, unspecified: Secondary | ICD-10-CM

## 2016-08-27 LAB — SAMPLE TO BLOOD BANK

## 2016-08-27 LAB — HEMOGLOBIN: Hemoglobin: 9.7 g/dL — ABNORMAL LOW (ref 12.0–16.0)

## 2016-08-27 LAB — HEMATOCRIT: HEMATOCRIT: 28.8 % — AB (ref 35.0–47.0)

## 2016-08-30 NOTE — Discharge Summary (Signed)
Morgan [name] at [place] NAME: Morgan [name]    MR#:  [id number]  DATE OF BIRTH:  08/09/1935  DATE OF ADMISSION:  08/22/2016 ADMITTING PHYSICIAN: Lytle Butte, MD  DATE OF DISCHARGE: 08/24/2016 10:40 AM  PRIMARY CARE PHYSICIAN: Kirk Ruths., MD   ADMISSION DIAGNOSIS:  Rectal bleeding [K62.5] Acute renal insufficiency [N28.9] Anemia, unspecified anemia type [D64.9]  DISCHARGE DIAGNOSIS:  Active Problems:   GIB (gastrointestinal bleeding)   AKI (acute kidney injury) ([place])   Rectal bleeding   Iron deficiency anemia, unspecified   Acute posthemorrhagic anemia   Blood in stool   Benign neoplasm of colon   Angiodysplasia of intestine with hemorrhage   SECONDARY DIAGNOSIS:   Past Medical History:  Diagnosis Date  . Abnormal LFTs (liver function tests)   . Adenomatous polyps 03/05/2012  . Angiodysplasia   . Aortic stenosis   . Arthritis   . AV malformation of GI tract   . Bilateral cellulitis of lower leg   . CAD (coronary artery disease)   . Cardiomyopathy ([place])   . Carotid bruit   . Cervical cancer ([place])    cervical cancer history  . CHF (congestive heart failure) ([place])   . Chronic edema   . Clostridium difficile infection March 02, 2015  . Complication of anesthesia     I have trouble waking up "  . COPD (chronic obstructive pulmonary disease) ([place])   . Diabetes mellitus with stage 3 chronic kidney disease ([place])   . Fatty liver   . Fibrocystic breast changes   . GERD (gastroesophageal reflux disease)   . GI bleed   . Gout   . History of blood transfusion   . History of nicotine use   . Hyperlipidemia   . Hypertension   . Iron deficiency anemia 2008  . Irritable bowel syndrome   . Macrocytosis   . Osteoarthritis   . Peptic ulcer disease   . PVD (peripheral vascular disease) ([place])   . Tobacco abuse   . Trigeminal neuralgia   . Vascular malformation 06/14/1997     ADMITTING HISTORY  Morgan [name]   is a 81 y.o. female with a known history of Aortic stenosis, AV malformations with recurrent GI bleeding who is presenting with GI bleeding. She states that for the past 2-3 days she has been experiencing bright red blood per rectum with associated lower abdominal cramping intensity 2-3/10 without worsening or relieving factors nonradiating. She had blood work drawn earlier in the week which revealed hemoglobin of 6.6 she is then received 2 units packed red blood cell transfusion as an outpatient but given persistence of bleeding present to Hospital further workup and evaluation. She follows with Dr. Vira Agar of gastroenterology as an outpatient. She attests to having poor oral intake denies any other symptoms at this time. Plan to have hemoglobin 8.9 with evidence of acute kidney injury.  She been on Procrit but was intolerant secondary to GI distress  HOSPITAL COURSE:   81 year old Caucasian female history of recurrent GI bleed  1. Bright red blood per rectum With acute blood loss anemia Ordered 1 unit packed RBC EGD showed nothing acute. Colonoscopy showed one bleeding AVM which was cauterized. No further bleeding. Hb stable  Discharge home with PCP and GI f/u. Patient has iron infusion set up at cancer center  2. CKD stage III Close to normal. On IV fluids.  3. GERD withoutesophagitis: ppi  4. Essential hypertension Lisinopril  5. DVT prophylaxis with  SCDs  Discharge home  CONSULTS OBTAINED:  Treatment Team:  Lytle Butte, MD Lucilla Lame, MD  DRUG ALLERGIES:   Allergies  Allergen Reactions  . Aspirin Other (See Comments)    bleeding  . Atenolol Other (See Comments)    fatigue  . Clopidogrel Bisulfate Other (See Comments)    bleeding  . Darvon [Propoxyphene] Hives and Other (See Comments)  . Hydrochlorothiazide Other (See Comments)    Fatigue and cramps  . Iodinated Diagnostic Agents Other (See Comments)    Questionable GI bleed  . Nylon Hives  . Other Other  (See Comments)    Nylon sutures  . Pravastatin Other (See Comments)    Severe cramping    DISCHARGE MEDICATIONS:   Discharge Medication List as of 08/24/2016 10:04 AM    START taking these medications   Details  amLODipine (NORVASC) 2.5 MG tablet Take 1 tablet (2.5 mg total) by mouth daily., Starting Sat 08/24/2016, Normal      CONTINUE these medications which have NOT CHANGED   Details  acetaminophen (TYLENOL) 500 MG tablet Take 500 mg by mouth at bedtime., Until Discontinued, Historical Med    lisinopril (PRINIVIL,ZESTRIL) 40 MG tablet Take 40 mg by mouth daily., Until Discontinued, Historical Med    metoprolol succinate (TOPROL-XL) 50 MG 24 hr tablet Take 50 mg by mouth daily. , Until Discontinued, Historical Med    pantoprazole (PROTONIX) 40 MG tablet Take 40 mg by mouth daily., Until Discontinued, Historical Med    simvastatin (ZOCOR) 20 MG tablet Take 20 mg by mouth at bedtime. , Until Discontinued, Historical Med    torsemide (DEMADEX) 20 MG tablet Take 1 tablet by mouth 4 (four) times daily. , Until Discontinued, Historical Med    ciprofloxacin (CIPRO) 500 MG tablet Take 1 tablet (500 mg total) by mouth 2 (two) times daily., Starting 07/13/2016, Until Discontinued, Print        Today   VITAL SIGNS:  Blood pressure (!) 160/66, pulse (!) 112, temperature 98.9 F (37.2 C), temperature source Oral, resp. rate 17, height '5\' 3"'$  (1.6 m), weight 84.3 kg (185 lb 12.8 oz), SpO2 95 %.  I/O:  No intake or output data in the 24 hours ending 08/30/16 2352  PHYSICAL EXAMINATION:  Physical Exam  GENERAL:  81 y.o.-year-old patient lying in the bed with no acute distress.  LUNGS: Normal breath sounds bilaterally, no wheezing, rales,rhonchi or crepitation. No use of accessory muscles of respiration.  CARDIOVASCULAR: S1, S2 normal. No murmurs, rubs, or gallops.  ABDOMEN: Soft, non-tender, non-distended. Bowel sounds present. No organomegaly or mass.  NEUROLOGIC: Moves all 4  extremities. PSYCHIATRIC: The patient is alert and oriented x 3.  SKIN: No obvious rash, lesion, or ulcer.   DATA REVIEW:   CBC  Recent Labs Lab 08/27/16 0922  HGB 9.7*  HCT 28.8*    Chemistries  No results for input(s): NA, K, CL, CO2, GLUCOSE, BUN, CREATININE, CALCIUM, MG, AST, ALT, ALKPHOS, BILITOT in the last 168 hours.  Invalid input(s): GFRCGP  Cardiac Enzymes No results for input(s): TROPONINI in the last 168 hours.  Microbiology Results  Results for orders placed or performed during the hospital encounter of 07/09/16  Culture, Urine     Status: Abnormal   Collection Time: 07/13/16  3:46 AM  Result Value Ref Range Status   Specimen Description URINE, RANDOM  Final   Special Requests NONE  Final   Culture >=100,000 COLONIES/mL ESCHERICHIA COLI (A)  Final   Report Status 07/15/2016 FINAL  Final   Organism ID, Bacteria ESCHERICHIA COLI (A)  Final      Susceptibility   Escherichia coli - MIC*    AMPICILLIN <=2 SENSITIVE Sensitive     CEFAZOLIN <=4 SENSITIVE Sensitive     CEFTRIAXONE <=1 SENSITIVE Sensitive     CIPROFLOXACIN <=0.25 SENSITIVE Sensitive     GENTAMICIN <=1 SENSITIVE Sensitive     IMIPENEM <=0.25 SENSITIVE Sensitive     NITROFURANTOIN <=16 SENSITIVE Sensitive     TRIMETH/SULFA <=20 SENSITIVE Sensitive     AMPICILLIN/SULBACTAM <=2 SENSITIVE Sensitive     PIP/TAZO <=4 SENSITIVE Sensitive     * >=100,000 COLONIES/mL ESCHERICHIA COLI    RADIOLOGY:  No results found.  Follow up with PCP in 1 week.  Management plans discussed with the patient, family and they are in agreement.  CODE STATUS:  Code Status History    Date Active Date Inactive Code Status Order ID Comments User Context   08/22/2016  2:20 PM 08/24/2016  2:32 PM Full Code [id number]  Lytle Butte, MD ED   07/09/2016 11:45 PM 07/13/2016  3:55 PM Full Code [id number]  Vianne Bulls, MD Inpatient      TOTAL TIME TAKING CARE OF THIS PATIENT ON DAY OF DISCHARGE: more than 30 minutes.    Hillary Bow R M.D on 08/30/2016 at 11:52 PM  Between 7am to 6pm - Pager - [phone]  After 6pm go to [email or web address] - password [email or web address] [facility]  Office  [phone]  CC: Primary care physician; Kirk Ruths., MD  Note: This dictation was prepared with Dragon dictation along with smaller phrase technology. Any transcriptional errors that result from this process are unintentional.

## 2016-09-02 ENCOUNTER — Inpatient Hospital Stay: Payer: Medicare Other

## 2016-09-02 DIAGNOSIS — D509 Iron deficiency anemia, unspecified: Secondary | ICD-10-CM

## 2016-09-02 DIAGNOSIS — I130 Hypertensive heart and chronic kidney disease with heart failure and stage 1 through stage 4 chronic kidney disease, or unspecified chronic kidney disease: Secondary | ICD-10-CM | POA: Diagnosis not present

## 2016-09-02 DIAGNOSIS — D631 Anemia in chronic kidney disease: Secondary | ICD-10-CM

## 2016-09-02 DIAGNOSIS — N183 Chronic kidney disease, stage 3 (moderate): Principal | ICD-10-CM

## 2016-09-02 LAB — HEMATOCRIT: HEMATOCRIT: 29.3 % — AB (ref 35.0–47.0)

## 2016-09-02 LAB — SAMPLE TO BLOOD BANK

## 2016-09-02 LAB — HEMOGLOBIN: HEMOGLOBIN: 9.8 g/dL — AB (ref 12.0–16.0)

## 2016-09-09 ENCOUNTER — Inpatient Hospital Stay: Payer: Medicare Other

## 2016-09-10 ENCOUNTER — Inpatient Hospital Stay: Payer: Medicare Other

## 2016-09-16 ENCOUNTER — Inpatient Hospital Stay: Payer: Medicare Other

## 2016-09-17 ENCOUNTER — Inpatient Hospital Stay: Payer: Medicare Other

## 2016-09-25 ENCOUNTER — Ambulatory Visit: Payer: Medicare Other | Admitting: Internal Medicine

## 2016-09-25 ENCOUNTER — Other Ambulatory Visit: Payer: Medicare Other

## 2016-09-25 ENCOUNTER — Ambulatory Visit: Payer: Medicare Other

## 2016-09-25 IMAGING — US US EXTREM LOW VENOUS*R*
1 series · 13 of 24 positions shown · non-contrast
Comparison: None.

CLINICAL DATA: Acute right lower extremity swelling and pain for 4
weeks.



[Series 1: us extrem low venous*right* · 0.07mm/px · 13 of 32 slices shown]
[im 1/32]
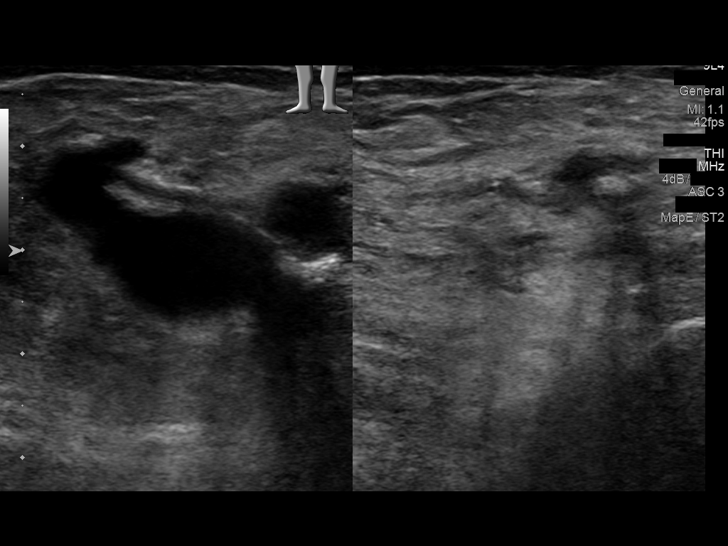
[im 3/32]
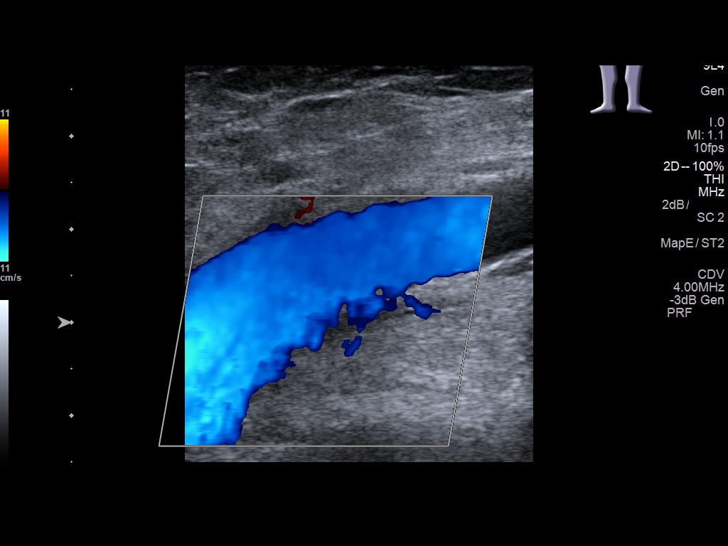
[im 6/32]
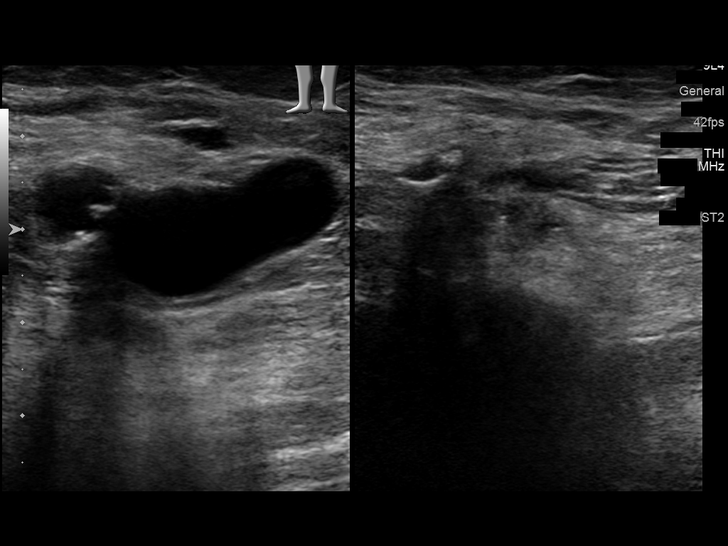
[im 9/32]
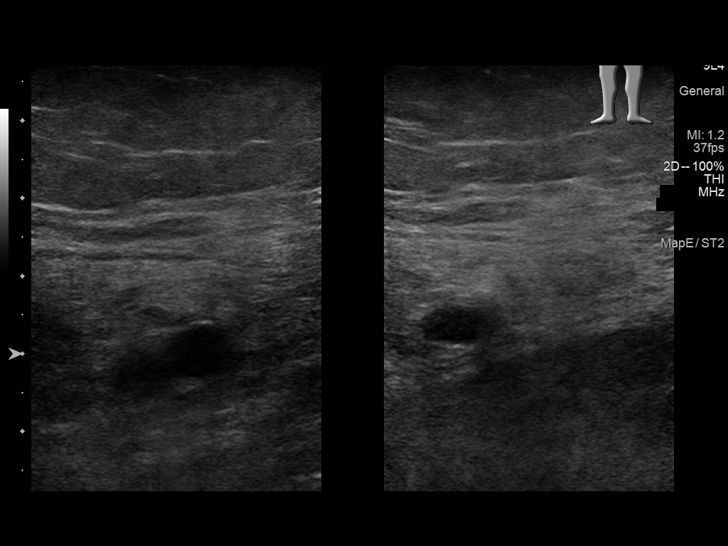
[im 11/32]
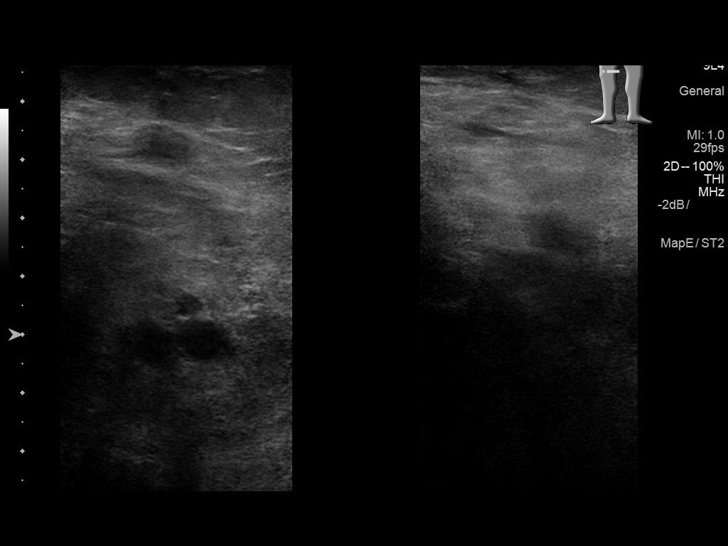
[im 14/32]
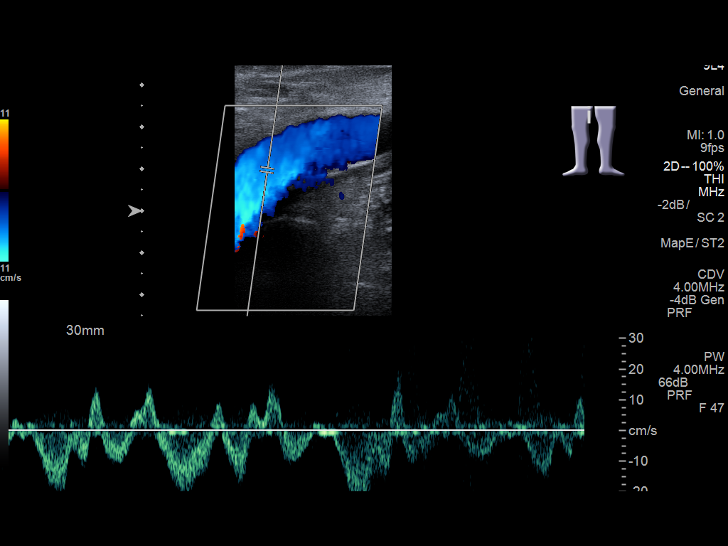
[im 17/32]
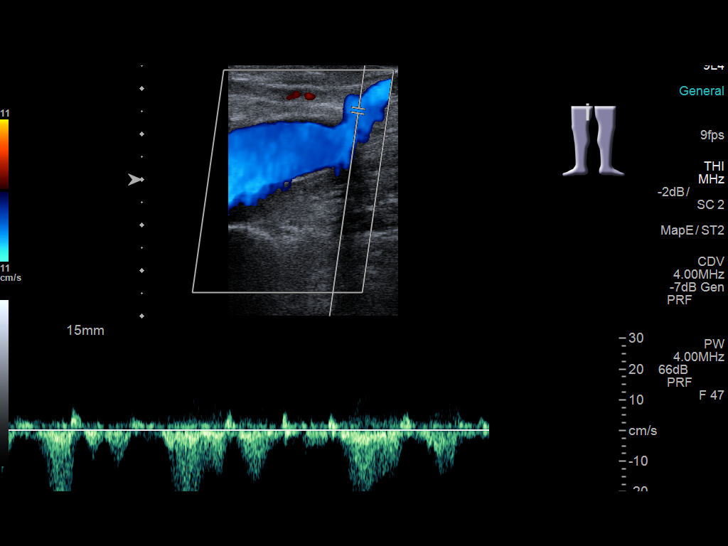
[im 18/32]
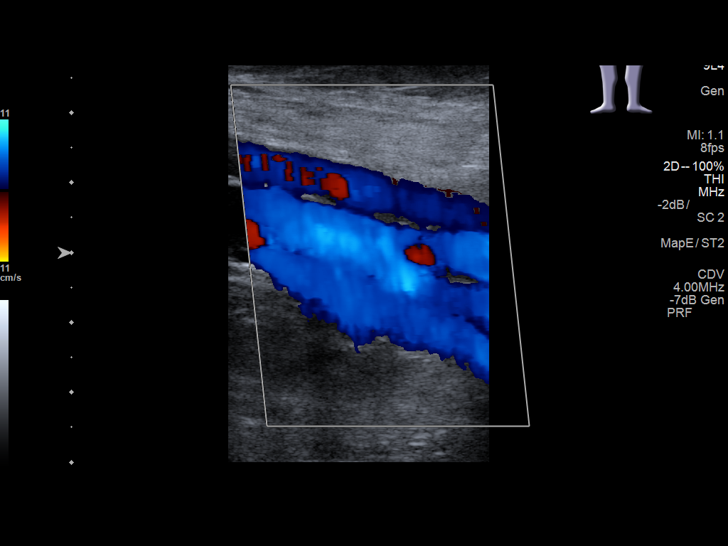
[im 21/32]
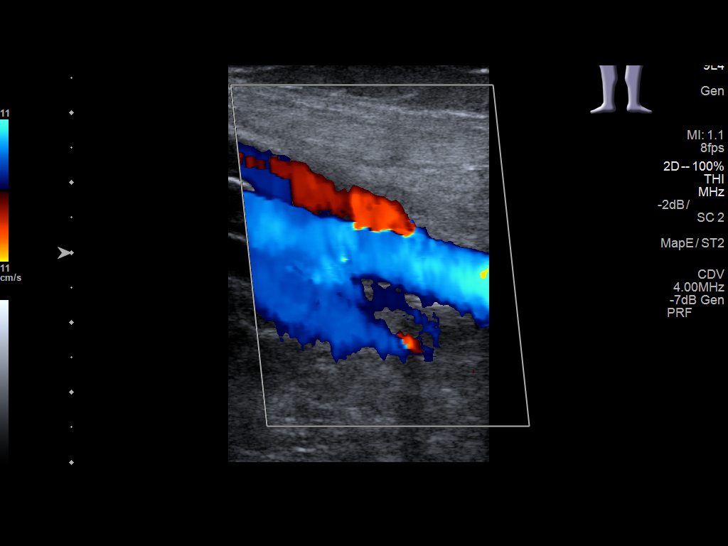
[im 23/32]
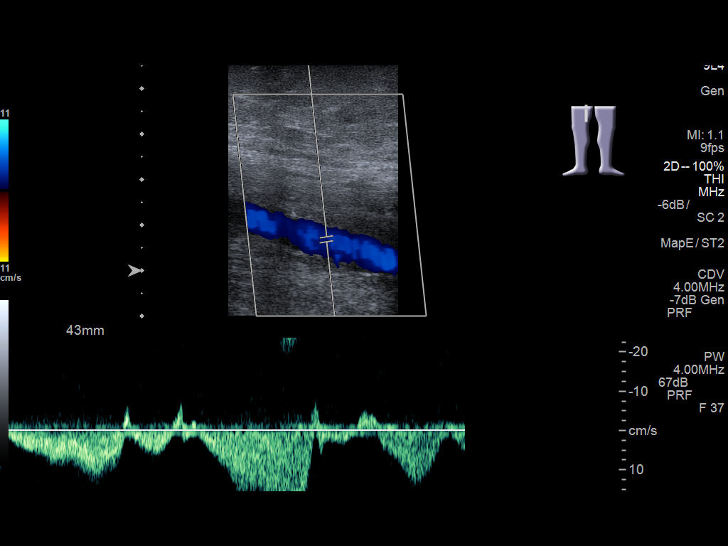
[im 26/32]
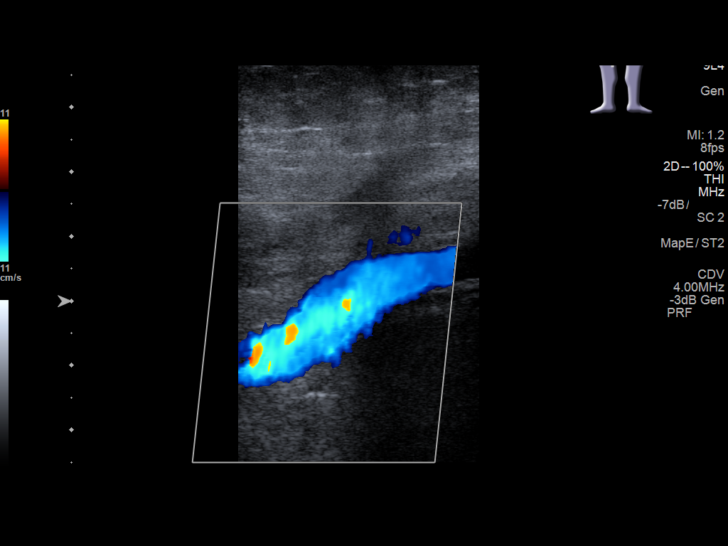
[im 29/32]
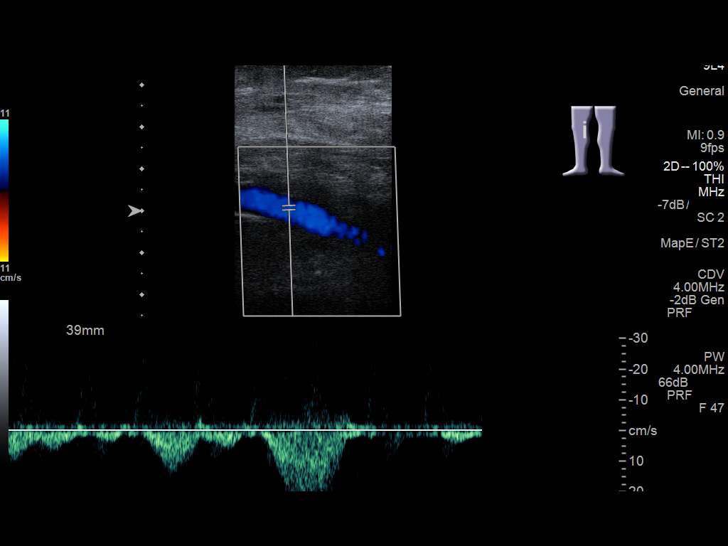
[im 32/32]
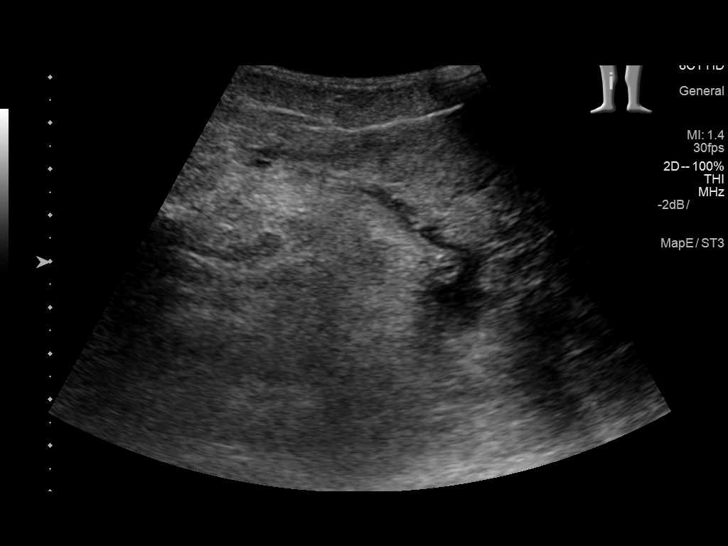

[13 of 24 positions shown; findings below may reference images not displayed]

FINDINGS: Contralateral Common Femoral Vein: Respiratory phasicity is normal
and symmetric with the symptomatic side. No evidence of thrombus.
Normal compressibility.

Common Femoral Vein: No evidence of thrombus. Normal
compressibility, respiratory phasicity and response to augmentation.

Saphenofemoral Junction: No evidence of thrombus. Normal
compressibility and flow on color Doppler imaging.

Profunda Femoral Vein: No evidence of thrombus. Normal
compressibility and flow on color Doppler imaging.

Femoral Vein: No evidence of thrombus. Normal compressibility,
respiratory phasicity and response to augmentation.

Popliteal Vein: No evidence of thrombus. Normal compressibility,
respiratory phasicity and response to augmentation.

Calf Veins: Limited visualization of the calf peroneal veins.
Posterior tibial veins where visualized appear patent.

Superficial Great Saphenous Vein: No evidence of thrombus. Normal
compressibility and flow on color Doppler imaging.

Venous Reflux:  None.

Other Findings:  None.
IMPRESSION: No significant occlusive DVT demonstrated with limited assessment of
the calf veins.

## 2016-09-25 MED ORDER — SODIUM CHLORIDE 0.9 % IV SOLN: 250.0000 mL | Freq: Once | INTRAVENOUS | Status: DC

## 2016-09-25 MED ORDER — HEPARIN SOD (PORK) LOCK FLUSH 100 UNIT/ML IV SOLN: 500.0000 [IU] | Freq: Every day | INTRAVENOUS | Status: DC | PRN

## 2016-09-25 MED ORDER — ACETAMINOPHEN 325 MG PO TABS: 650.0000 mg | ORAL_TABLET | Freq: Once | ORAL | Status: DC

## 2016-09-25 MED ORDER — SODIUM CHLORIDE 0.9% FLUSH
3.0000 mL | INTRAVENOUS | Status: DC | PRN
  Filled 2016-09-25: qty 3

## 2016-09-25 MED ORDER — FUROSEMIDE 10 MG/ML IJ SOLN: 20.0000 mg | Freq: Once | INTRAMUSCULAR | Status: DC

## 2016-09-25 MED ORDER — HEPARIN SOD (PORK) LOCK FLUSH 100 UNIT/ML IV SOLN: 250.0000 [IU] | INTRAVENOUS | Status: DC | PRN

## 2016-09-25 MED ORDER — SODIUM CHLORIDE 0.9% FLUSH
10.0000 mL | INTRAVENOUS | Status: DC | PRN
  Filled 2016-09-25: qty 10

## 2016-09-25 MED ORDER — DIPHENHYDRAMINE HCL 25 MG PO CAPS: 25.0000 mg | ORAL_CAPSULE | Freq: Once | ORAL | Status: DC

## 2016-09-26 ENCOUNTER — Inpatient Hospital Stay: Payer: Medicare Other

## 2016-09-26 ENCOUNTER — Encounter: Payer: Self-pay | Admitting: Internal Medicine

## 2016-09-26 ENCOUNTER — Inpatient Hospital Stay: Payer: Medicare Other | Attending: Internal Medicine | Admitting: Internal Medicine

## 2016-09-26 DIAGNOSIS — N183 Chronic kidney disease, stage 3 unspecified: Secondary | ICD-10-CM

## 2016-09-26 DIAGNOSIS — K589 Irritable bowel syndrome without diarrhea: Secondary | ICD-10-CM | POA: Insufficient documentation

## 2016-09-26 DIAGNOSIS — E785 Hyperlipidemia, unspecified: Secondary | ICD-10-CM | POA: Insufficient documentation

## 2016-09-26 DIAGNOSIS — I130 Hypertensive heart and chronic kidney disease with heart failure and stage 1 through stage 4 chronic kidney disease, or unspecified chronic kidney disease: Secondary | ICD-10-CM

## 2016-09-26 DIAGNOSIS — I509 Heart failure, unspecified: Secondary | ICD-10-CM | POA: Diagnosis not present

## 2016-09-26 DIAGNOSIS — Z79899 Other long term (current) drug therapy: Secondary | ICD-10-CM | POA: Insufficient documentation

## 2016-09-26 DIAGNOSIS — I350 Nonrheumatic aortic (valve) stenosis: Secondary | ICD-10-CM | POA: Insufficient documentation

## 2016-09-26 DIAGNOSIS — N189 Chronic kidney disease, unspecified: Principal | ICD-10-CM

## 2016-09-26 DIAGNOSIS — Z8541 Personal history of malignant neoplasm of cervix uteri: Secondary | ICD-10-CM | POA: Diagnosis not present

## 2016-09-26 DIAGNOSIS — Z87891 Personal history of nicotine dependence: Secondary | ICD-10-CM | POA: Insufficient documentation

## 2016-09-26 DIAGNOSIS — J449 Chronic obstructive pulmonary disease, unspecified: Secondary | ICD-10-CM | POA: Insufficient documentation

## 2016-09-26 DIAGNOSIS — K219 Gastro-esophageal reflux disease without esophagitis: Secondary | ICD-10-CM | POA: Insufficient documentation

## 2016-09-26 DIAGNOSIS — D631 Anemia in chronic kidney disease: Secondary | ICD-10-CM | POA: Diagnosis not present

## 2016-09-26 DIAGNOSIS — Z955 Presence of coronary angioplasty implant and graft: Secondary | ICD-10-CM | POA: Insufficient documentation

## 2016-09-26 DIAGNOSIS — I429 Cardiomyopathy, unspecified: Secondary | ICD-10-CM | POA: Diagnosis not present

## 2016-09-26 DIAGNOSIS — M109 Gout, unspecified: Secondary | ICD-10-CM | POA: Diagnosis not present

## 2016-09-26 DIAGNOSIS — M1990 Unspecified osteoarthritis, unspecified site: Secondary | ICD-10-CM | POA: Diagnosis not present

## 2016-09-26 DIAGNOSIS — I739 Peripheral vascular disease, unspecified: Secondary | ICD-10-CM | POA: Insufficient documentation

## 2016-09-26 DIAGNOSIS — I2510 Atherosclerotic heart disease of native coronary artery without angina pectoris: Secondary | ICD-10-CM | POA: Diagnosis not present

## 2016-09-26 DIAGNOSIS — L03116 Cellulitis of left lower limb: Secondary | ICD-10-CM | POA: Insufficient documentation

## 2016-09-26 DIAGNOSIS — E1122 Type 2 diabetes mellitus with diabetic chronic kidney disease: Secondary | ICD-10-CM | POA: Insufficient documentation

## 2016-09-26 DIAGNOSIS — D509 Iron deficiency anemia, unspecified: Secondary | ICD-10-CM

## 2016-09-26 LAB — CBC WITH DIFFERENTIAL/PLATELET
Basophils Absolute: 0.1 10*3/uL (ref 0–0.1)
Basophils Relative: 1 %
EOS PCT: 1 %
Eosinophils Absolute: 0.1 10*3/uL (ref 0–0.7)
HEMATOCRIT: 29.2 % — AB (ref 35.0–47.0)
Hemoglobin: 9.7 g/dL — ABNORMAL LOW (ref 12.0–16.0)
LYMPHS ABS: 0.3 10*3/uL — AB (ref 1.0–3.6)
LYMPHS PCT: 5 %
MCH: 29.5 pg (ref 26.0–34.0)
MCHC: 33.4 g/dL (ref 32.0–36.0)
MCV: 88.4 fL (ref 80.0–100.0)
Monocytes Absolute: 0.5 10*3/uL (ref 0.2–0.9)
Monocytes Relative: 8 %
NEUTROS ABS: 5.5 10*3/uL (ref 1.4–6.5)
Neutrophils Relative %: 85 %
PLATELETS: 144 10*3/uL — AB (ref 150–440)
RBC: 3.3 MIL/uL — AB (ref 3.80–5.20)
RDW: 16.3 % — ABNORMAL HIGH (ref 11.5–14.5)
WBC: 6.4 10*3/uL (ref 3.6–11.0)

## 2016-09-26 LAB — IRON AND TIBC
IRON: 70 ug/dL (ref 28–170)
Saturation Ratios: 22 % (ref 10.4–31.8)
TIBC: 314 ug/dL (ref 250–450)
UIBC: 244 ug/dL

## 2016-09-26 LAB — COMPREHENSIVE METABOLIC PANEL
ALK PHOS: 125 U/L (ref 38–126)
ALT: 13 U/L — AB (ref 14–54)
AST: 18 U/L (ref 15–41)
Albumin: 2.9 g/dL — ABNORMAL LOW (ref 3.5–5.0)
Anion gap: 9 (ref 5–15)
BILIRUBIN TOTAL: 0.9 mg/dL (ref 0.3–1.2)
BUN: 22 mg/dL — AB (ref 6–20)
CALCIUM: 8 mg/dL — AB (ref 8.9–10.3)
CHLORIDE: 95 mmol/L — AB (ref 101–111)
CO2: 29 mmol/L (ref 22–32)
CREATININE: 1.64 mg/dL — AB (ref 0.44–1.00)
GFR, EST AFRICAN AMERICAN: 33 mL/min — AB (ref >60)
GFR, EST NON AFRICAN AMERICAN: 29 mL/min — AB (ref >60)
Glucose, Bld: 198 mg/dL — ABNORMAL HIGH (ref 65–99)
Potassium: 3.8 mmol/L (ref 3.5–5.1)
Sodium: 133 mmol/L — ABNORMAL LOW (ref 135–145)
TOTAL PROTEIN: 6.9 g/dL (ref 6.5–8.1)

## 2016-09-26 LAB — SAMPLE TO BLOOD BANK

## 2016-09-26 LAB — FERRITIN: FERRITIN: 39 ng/mL (ref 11–307)

## 2016-09-26 NOTE — Progress Notes (Signed)
[place] OFFICE PROGRESS NOTE  Patient Care Team: Kirk Ruths, MD as PCP - General (Internal Medicine) Cammie Sickle, MD as Consulting Physician (Hematology and Oncology)   SUMMARY OF HEMATOLOGIC HISTORY:  # CHRONIC ANEMIA sec Hx GIB/CKD [ CKD STAGE III]; Jan-2016 [colo/EGD- Dr.Elliot]; capsule x2 - angiodysplasia; BMBx- 1999; IV iron & procrit; July 2017- colo- AVMs in cecum & stomach [s/p [place]; Dr.Buccini; [place]]; Aug 2017- Declines Procrit.   # CHRONIC MILD THROMBOCYTOPENIA-   # CKD [creat 1.4-1.7]  # CHRONIC CELLULITIS/ AORTIC STENOSIS/ LIMITED MOBILITY   INTERVAL HISTORY:  81 year old female patient with multiple medical problems and above history of chronic anemia from chronic GI blood loss/chronic kidney disease on iron; declined Procrit is here for follow-up.  Patient last received blood transfusion few weeks ago. She is fatigued.  She is currently on Keflex for cellulitis of the left lower extremity. Denies any blood in stools. No nausea or vomiting. No fevers or chills.Marland Kitchen   REVIEW OF SYSTEMS:  A complete 10 point review of system is done which is negative except mentioned above/history of present illness.   PAST MEDICAL HISTORY :  Past Medical History:  Diagnosis Date  . Abnormal LFTs (liver function tests)   . Adenomatous polyps 03/05/2012  . Angiodysplasia   . Aortic stenosis   . Arthritis   . AV malformation of GI tract   . Bilateral cellulitis of lower leg   . CAD (coronary artery disease)   . Cardiomyopathy ([place])   . Carotid bruit   . Cervical cancer ([place])    cervical cancer history  . CHF (congestive heart failure) ([place])   . Chronic edema   . Clostridium difficile infection March 02, 2015  . Complication of anesthesia     I have trouble waking up "  . COPD (chronic obstructive pulmonary disease) ([place])   . Diabetes mellitus with stage 3 chronic kidney disease ([place])   . Fatty liver   . Fibrocystic breast changes   . GERD  (gastroesophageal reflux disease)   . GI bleed   . Gout   . History of blood transfusion   . History of nicotine use   . Hyperlipidemia   . Hypertension   . Iron deficiency anemia 2008  . Irritable bowel syndrome   . Macrocytosis   . Osteoarthritis   . Peptic ulcer disease   . PVD (peripheral vascular disease) ([place])   . Tobacco abuse   . Trigeminal neuralgia   . Vascular malformation 06/14/1997    PAST SURGICAL HISTORY :   Past Surgical History:  Procedure Laterality Date  . ABDOMINAL HYSTERECTOMY    . angiogram cardiac stenting    . BONE MARROW BIOPSY  1999  . CHOLECYSTECTOMY    . COLONOSCOPY  12/2014, 2014, 2013, 2008, 2004   03/05/2012-Adenomatous Polyps;09/23/2003-Polyp remains intact;   . COLONOSCOPY N/A 07/12/2016   Procedure: COLONOSCOPY;  Surgeon: Ronald Lobo, MD;  Location: [facility] ENDOSCOPY;  Service: Endoscopy;  Laterality: N/A;  . COLONOSCOPY N/A 08/23/2016   Procedure: COLONOSCOPY;  Surgeon: Lucilla Lame, MD;  Location: [facility] ENDOSCOPY;  Service: Endoscopy;  Laterality: N/A;  . Endoscopic Carpal Tunnel Release    . ESOPHAGOGASTRODUODENOSCOPY  12/2014, 2014, 2013, 2009, 2004  . ESOPHAGOGASTRODUODENOSCOPY (EGD) WITH PROPOFOL N/A 07/12/2016   Procedure: ESOPHAGOGASTRODUODENOSCOPY (EGD) WITH PROPOFOL;  Surgeon: Ronald Lobo, MD;  Location: [facility];  Service: Endoscopy;  Laterality: N/A;  . ESOPHAGOGASTRODUODENOSCOPY (EGD) WITH PROPOFOL N/A 08/23/2016   Procedure: ESOPHAGOGASTRODUODENOSCOPY (EGD) WITH PROPOFOL;  Surgeon:  Lucilla Lame, MD;  Location: [facility] ENDOSCOPY;  Service: Endoscopy;  Laterality: N/A;  . HOT HEMOSTASIS N/A 07/12/2016   Procedure: HOT HEMOSTASIS (ARGON PLASMA COAGULATION/BICAP);  Surgeon: Ronald Lobo, MD;  Location: [facility] ENDOSCOPY;  Service: Endoscopy;  Laterality: N/A;  . NASAL SINUS SURGERY    . TEE WITHOUT CARDIOVERSION N/A 12/19/2015   Procedure: TRANSESOPHAGEAL ECHOCARDIOGRAM (TEE);  Surgeon: Yolonda Kida, MD;  Location: [facility] ORS;  Service:  Cardiovascular;  Laterality: N/A;  . THORACENTESIS    . TONSILLECTOMY      FAMILY HISTORY :   Family History  Problem Relation Age of Onset  . Parkinsonism Mother   . Heart attack Mother   . Lung cancer Father   . Diabetes Mellitus II Father   . Asthma Sister   . Asthma Brother   . Heart disease Son     SOCIAL HISTORY:   Social History  Substance Use Topics  . Smoking status: Former Smoker    Packs/day: 0.50    Years: 56.00    Types: Cigarettes    Quit date: 05/23/2013  . Smokeless tobacco: Never Used  . Alcohol use No    ALLERGIES:  is allergic to aspirin; atenolol; clopidogrel bisulfate; darvon [propoxyphene]; hydrochlorothiazide; iodinated diagnostic agents; nylon; other; and pravastatin.  MEDICATIONS:  Current Outpatient Prescriptions  Medication Sig Dispense Refill  . acetaminophen (TYLENOL) 500 MG tablet Take 500 mg by mouth at bedtime.    Marland Kitchen amLODipine (NORVASC) 2.5 MG tablet Take 1 tablet (2.5 mg total) by mouth daily. 30 tablet 0  . cephALEXin (KEFLEX) 250 MG capsule Take by mouth.    Marland Kitchen lisinopril (PRINIVIL,ZESTRIL) 40 MG tablet Take 40 mg by mouth daily.    . metoprolol succinate (TOPROL-XL) 50 MG 24 hr tablet Take 50 mg by mouth daily.     . pantoprazole (PROTONIX) 40 MG tablet Take 40 mg by mouth daily.    . simvastatin (ZOCOR) 20 MG tablet Take 20 mg by mouth at bedtime.     . torsemide (DEMADEX) 20 MG tablet Take 1 tablet by mouth 4 (four) times daily.      No current facility-administered medications for this visit.    Facility-Administered Medications Ordered in Other Visits  Medication Dose Route Frequency Provider Last Rate Last Dose  . acetaminophen (TYLENOL) tablet 650 mg  650 mg Oral Once Cammie Sickle, MD      . diphenhydrAMINE (BENADRYL) capsule 25 mg  25 mg Oral Once Cammie Sickle, MD      . epoetin alfa (EPOGEN,PROCRIT) injection 20,000 Units  20,000 Units Subcutaneous Once Cammie Sickle, MD      . furosemide (LASIX)  injection 20 mg  20 mg Intravenous Once Cammie Sickle, MD      . heparin lock flush 100 unit/mL  500 Units Intracatheter Daily PRN Cammie Sickle, MD      . heparin lock flush 100 unit/mL  250 Units Intracatheter PRN Cammie Sickle, MD      . sodium chloride flush (NS) 0.9 % injection 10 mL  10 mL Intracatheter PRN Cammie Sickle, MD      . sodium chloride flush (NS) 0.9 % injection 3 mL  3 mL Intracatheter PRN Cammie Sickle, MD        PHYSICAL EXAMINATION: ECOG PERFORMANCE STATUS: 2 - Symptomatic, <50% confined to bed  BP (!) 164/73 (BP Location: Left Arm, Patient Position: Sitting)   Pulse 80   Temp (!) 96.4 F (35.8 C) (Tympanic)  Resp 19   Ht 5' 3"  (1.6 m)   Wt 200 lb 11.2 oz (91 kg)   BMI 35.55 kg/m   Filed Weights   09/26/16 1023  Weight: 200 lb 11.2 oz (91 kg)    GENERAL: Well-nourished well-developed; Alert, no distress and comfortable.   She is accompanied by her sister. She is in a wheelchair. She cannot sit on the exam table.  EYES: Positive for pallor. OROPHARYNX: no thrush or ulceration; good dentition  NECK: supple, no masses felt LYMPH:  no palpable lymphadenopathy in the cervical,  LUNGS: clear to auscultation and  No wheeze or crackles HEART/CVS: regular rate & rhythm and positive for systolic murmurs; bilateral lower extremity swelling ; left larger than the right /chronic venous stasis changes noted.  ABDOMEN:abdomen soft, non-tender and normal bowel sounds Musculoskeletal:no cyanosis of digits and no clubbing  PSYCH: alert & oriented x 3 with fluent speech NEURO: no focal motor/sensory deficits SKIN:  erythema of the left lower extremity /improving.   LABORATORY DATA:  I have reviewed the data as listed    Component Value Date/Time   NA 133 (L) 09/26/2016 1001   NA 139 01/18/2015 0545   K 3.8 09/26/2016 1001   K 3.9 01/18/2015 0545   CL 95 (L) 09/26/2016 1001   CL 105 01/18/2015 0545   CO2 29 09/26/2016 1001   CO2  25 01/18/2015 0545   GLUCOSE 198 (H) 09/26/2016 1001   GLUCOSE 179 (H) 01/18/2015 0545   BUN 22 (H) 09/26/2016 1001   BUN 29 (H) 01/18/2015 0545   CREATININE 1.64 (H) 09/26/2016 1001   CREATININE 1.80 (H) 01/18/2015 0545   CALCIUM 8.0 (L) 09/26/2016 1001   CALCIUM 8.7 01/18/2015 0545   PROT 6.9 09/26/2016 1001   PROT 6.6 06/20/2014 1101   ALBUMIN 2.9 (L) 09/26/2016 1001   ALBUMIN 3.1 (L) 06/20/2014 1101   AST 18 09/26/2016 1001   AST 32 06/20/2014 1101   ALT 13 (L) 09/26/2016 1001   ALT 31 06/20/2014 1101   ALKPHOS 125 09/26/2016 1001   ALKPHOS 125 (H) 06/20/2014 1101   BILITOT 0.9 09/26/2016 1001   BILITOT 0.3 06/20/2014 1101   GFRNONAA 29 (L) 09/26/2016 1001   GFRNONAA 29 (L) 01/18/2015 0545   GFRNONAA 28 (L) 06/20/2014 1101   GFRAA 33 (L) 09/26/2016 1001   GFRAA 35 (L) 01/18/2015 0545   GFRAA 33 (L) 06/20/2014 1101    No results found for: SPEP, UPEP  Lab Results  Component Value Date   WBC 6.4 09/26/2016   NEUTROABS 5.5 09/26/2016   HGB 9.7 (L) 09/26/2016   HCT 29.2 (L) 09/26/2016   MCV 88.4 09/26/2016   PLT 144 (L) 09/26/2016      Chemistry      Component Value Date/Time   NA 133 (L) 09/26/2016 1001   NA 139 01/18/2015 0545   K 3.8 09/26/2016 1001   K 3.9 01/18/2015 0545   CL 95 (L) 09/26/2016 1001   CL 105 01/18/2015 0545   CO2 29 09/26/2016 1001   CO2 25 01/18/2015 0545   BUN 22 (H) 09/26/2016 1001   BUN 29 (H) 01/18/2015 0545   CREATININE 1.64 (H) 09/26/2016 1001   CREATININE 1.80 (H) 01/18/2015 0545      Component Value Date/Time   CALCIUM 8.0 (L) 09/26/2016 1001   CALCIUM 8.7 01/18/2015 0545   ALKPHOS 125 09/26/2016 1001   ALKPHOS 125 (H) 06/20/2014 1101   AST 18 09/26/2016 1001   AST 32 06/20/2014 1101  ALT 13 (L) 09/26/2016 1001   ALT 31 06/20/2014 1101   BILITOT 0.9 09/26/2016 1001   BILITOT 0.3 06/20/2014 1101        ASSESSMENT & PLAN:   Iron deficiency anemia due to chronic blood loss # CHRONIC ANEMIA-chronic kidney disease/  secondary to GI bleed/AV malformations; iron deficiency. S/p 3 units PRBC/ GI colo-egd in July 2017 [Dr.Buccini]- AV malformations.   # Today hemoglobin is 9.7; iron studies pending. Plan IV Feraheme next week; then monthly; cbc monthly.   #Follow-up with me in 3 months [facility] BMP/Iron studies/ Ferritin.      Cammie Sickle, MD 09/26/2016 11:01 AM

## 2016-09-26 NOTE — Progress Notes (Signed)
Pt seen this morning for cellulitis.  Minimal walking since her fall a month ago.

## 2016-09-26 NOTE — Assessment & Plan Note (Signed)
#   CHRONIC ANEMIA-chronic kidney disease/ secondary to GI bleed/AV malformations; iron deficiency. S/p 3 units PRBC/ GI colo-egd in July 2017 [Dr.Buccini]- AV malformations.   # Today hemoglobin is 9.7; iron studies pending. Plan IV Feraheme next week; then monthly; cbc monthly.   #Follow-up with me in 3 months [facility] BMP/Iron studies/ Ferritin.

## 2016-10-03 ENCOUNTER — Inpatient Hospital Stay: Payer: Medicare Other

## 2016-10-03 VITALS — BP 147/64 | HR 76 | Temp 97.0°F | Resp 18

## 2016-10-03 DIAGNOSIS — I130 Hypertensive heart and chronic kidney disease with heart failure and stage 1 through stage 4 chronic kidney disease, or unspecified chronic kidney disease: Secondary | ICD-10-CM | POA: Diagnosis not present

## 2016-10-03 DIAGNOSIS — D500 Iron deficiency anemia secondary to blood loss (chronic): Secondary | ICD-10-CM

## 2016-10-03 DIAGNOSIS — N183 Chronic kidney disease, stage 3 (moderate): Secondary | ICD-10-CM

## 2016-10-03 DIAGNOSIS — D631 Anemia in chronic kidney disease: Secondary | ICD-10-CM

## 2016-10-03 MED ORDER — SODIUM CHLORIDE 0.9 % IV SOLN
510.0000 mg | Freq: Once | INTRAVENOUS | Status: AC
  Administered 2016-10-03: 510 mg via INTRAVENOUS
  Filled 2016-10-03: qty 17

## 2016-10-03 MED ORDER — EPOETIN ALFA 20000 UNIT/ML IJ SOLN: 20000.0000 [IU] | Freq: Once | INTRAMUSCULAR | Status: DC

## 2016-10-03 MED ORDER — SODIUM CHLORIDE 0.9 % IV SOLN
Freq: Once | INTRAVENOUS | Status: AC
  Administered 2016-10-03: 14:00:00 via INTRAVENOUS
  Filled 2016-10-03: qty 1000

## 2016-10-03 MED ORDER — HEPARIN SOD (PORK) LOCK FLUSH 100 UNIT/ML IV SOLN
500.0000 [IU] | Freq: Once | INTRAVENOUS | Status: AC | PRN
  Administered 2016-10-03: 500 [IU]

## 2016-10-24 ENCOUNTER — Inpatient Hospital Stay: Payer: Medicare Other

## 2016-10-28 ENCOUNTER — Inpatient Hospital Stay: Payer: Medicare Other | Attending: Internal Medicine

## 2016-10-28 ENCOUNTER — Inpatient Hospital Stay: Payer: Medicare Other

## 2016-10-28 VITALS — BP 148/74 | HR 106 | Temp 98.0°F | Resp 18

## 2016-10-28 DIAGNOSIS — D631 Anemia in chronic kidney disease: Secondary | ICD-10-CM

## 2016-10-28 DIAGNOSIS — I130 Hypertensive heart and chronic kidney disease with heart failure and stage 1 through stage 4 chronic kidney disease, or unspecified chronic kidney disease: Secondary | ICD-10-CM | POA: Insufficient documentation

## 2016-10-28 DIAGNOSIS — M1990 Unspecified osteoarthritis, unspecified site: Secondary | ICD-10-CM | POA: Diagnosis not present

## 2016-10-28 DIAGNOSIS — K219 Gastro-esophageal reflux disease without esophagitis: Secondary | ICD-10-CM | POA: Insufficient documentation

## 2016-10-28 DIAGNOSIS — M109 Gout, unspecified: Secondary | ICD-10-CM | POA: Insufficient documentation

## 2016-10-28 DIAGNOSIS — I509 Heart failure, unspecified: Secondary | ICD-10-CM | POA: Insufficient documentation

## 2016-10-28 DIAGNOSIS — Z87891 Personal history of nicotine dependence: Secondary | ICD-10-CM | POA: Diagnosis not present

## 2016-10-28 DIAGNOSIS — I2510 Atherosclerotic heart disease of native coronary artery without angina pectoris: Secondary | ICD-10-CM | POA: Insufficient documentation

## 2016-10-28 DIAGNOSIS — Z79899 Other long term (current) drug therapy: Secondary | ICD-10-CM | POA: Diagnosis not present

## 2016-10-28 DIAGNOSIS — N183 Chronic kidney disease, stage 3 unspecified: Secondary | ICD-10-CM

## 2016-10-28 DIAGNOSIS — E1122 Type 2 diabetes mellitus with diabetic chronic kidney disease: Secondary | ICD-10-CM | POA: Insufficient documentation

## 2016-10-28 DIAGNOSIS — K589 Irritable bowel syndrome without diarrhea: Secondary | ICD-10-CM | POA: Diagnosis not present

## 2016-10-28 DIAGNOSIS — I429 Cardiomyopathy, unspecified: Secondary | ICD-10-CM | POA: Insufficient documentation

## 2016-10-28 DIAGNOSIS — Z8541 Personal history of malignant neoplasm of cervix uteri: Secondary | ICD-10-CM | POA: Diagnosis not present

## 2016-10-28 DIAGNOSIS — I739 Peripheral vascular disease, unspecified: Secondary | ICD-10-CM | POA: Insufficient documentation

## 2016-10-28 DIAGNOSIS — J449 Chronic obstructive pulmonary disease, unspecified: Secondary | ICD-10-CM | POA: Diagnosis not present

## 2016-10-28 DIAGNOSIS — L03116 Cellulitis of left lower limb: Secondary | ICD-10-CM | POA: Insufficient documentation

## 2016-10-28 DIAGNOSIS — I350 Nonrheumatic aortic (valve) stenosis: Secondary | ICD-10-CM | POA: Insufficient documentation

## 2016-10-28 DIAGNOSIS — D500 Iron deficiency anemia secondary to blood loss (chronic): Secondary | ICD-10-CM

## 2016-10-28 DIAGNOSIS — E785 Hyperlipidemia, unspecified: Secondary | ICD-10-CM | POA: Insufficient documentation

## 2016-10-28 DIAGNOSIS — Z955 Presence of coronary angioplasty implant and graft: Secondary | ICD-10-CM | POA: Diagnosis not present

## 2016-10-28 LAB — CBC WITH DIFFERENTIAL/PLATELET
BASOS PCT: 1 %
Basophils Absolute: 0.1 10*3/uL (ref 0–0.1)
EOS ABS: 0.2 10*3/uL (ref 0–0.7)
EOS PCT: 2 %
HCT: 31.7 % — ABNORMAL LOW (ref 35.0–47.0)
Hemoglobin: 10.8 g/dL — ABNORMAL LOW (ref 12.0–16.0)
LYMPHS ABS: 0.4 10*3/uL — AB (ref 1.0–3.6)
Lymphocytes Relative: 5 %
MCH: 29.5 pg (ref 26.0–34.0)
MCHC: 34.1 g/dL (ref 32.0–36.0)
MCV: 86.4 fL (ref 80.0–100.0)
MONO ABS: 1 10*3/uL — AB (ref 0.2–0.9)
MONOS PCT: 12 %
Neutro Abs: 6.6 10*3/uL — ABNORMAL HIGH (ref 1.4–6.5)
Neutrophils Relative %: 80 %
PLATELETS: 148 10*3/uL — AB (ref 150–440)
RBC: 3.66 MIL/uL — ABNORMAL LOW (ref 3.80–5.20)
RDW: 15.5 % — AB (ref 11.5–14.5)
WBC: 8.3 10*3/uL (ref 3.6–11.0)

## 2016-10-28 LAB — SAMPLE TO BLOOD BANK

## 2016-10-28 MED ORDER — SODIUM CHLORIDE 0.9 % IJ SOLN
10.0000 mL | Freq: Once | INTRAMUSCULAR | Status: AC
  Administered 2016-10-28: 10 mL via INTRAVENOUS
  Filled 2016-10-28: qty 10

## 2016-10-28 MED ORDER — HEPARIN SOD (PORK) LOCK FLUSH 100 UNIT/ML IV SOLN
500.0000 [IU] | Freq: Once | INTRAVENOUS | Status: AC
  Administered 2016-10-28: 500 [IU] via INTRAVENOUS

## 2016-10-28 MED ORDER — SODIUM CHLORIDE 0.9 % IV SOLN
510.0000 mg | Freq: Once | INTRAVENOUS | Status: AC
  Administered 2016-10-28: 510 mg via INTRAVENOUS
  Filled 2016-10-28: qty 17

## 2016-10-28 MED ORDER — HEPARIN SOD (PORK) LOCK FLUSH 100 UNIT/ML IV SOLN
INTRAVENOUS | Status: AC
  Filled 2016-10-28: qty 5

## 2016-10-28 MED ORDER — SODIUM CHLORIDE 0.9 % IV SOLN
Freq: Once | INTRAVENOUS | Status: AC
  Administered 2016-10-28: 14:00:00 via INTRAVENOUS
  Filled 2016-10-28: qty 1000

## 2016-11-28 ENCOUNTER — Inpatient Hospital Stay: Payer: Medicare Other

## 2016-11-28 ENCOUNTER — Other Ambulatory Visit: Payer: Self-pay

## 2016-11-28 ENCOUNTER — Inpatient Hospital Stay: Payer: Medicare Other | Attending: Internal Medicine

## 2016-11-28 VITALS — BP 143/67 | HR 78 | Temp 97.6°F | Resp 18

## 2016-11-28 DIAGNOSIS — K219 Gastro-esophageal reflux disease without esophagitis: Secondary | ICD-10-CM | POA: Diagnosis not present

## 2016-11-28 DIAGNOSIS — I429 Cardiomyopathy, unspecified: Secondary | ICD-10-CM | POA: Diagnosis not present

## 2016-11-28 DIAGNOSIS — D631 Anemia in chronic kidney disease: Secondary | ICD-10-CM | POA: Insufficient documentation

## 2016-11-28 DIAGNOSIS — Z8541 Personal history of malignant neoplasm of cervix uteri: Secondary | ICD-10-CM | POA: Diagnosis not present

## 2016-11-28 DIAGNOSIS — J449 Chronic obstructive pulmonary disease, unspecified: Secondary | ICD-10-CM | POA: Diagnosis not present

## 2016-11-28 DIAGNOSIS — I130 Hypertensive heart and chronic kidney disease with heart failure and stage 1 through stage 4 chronic kidney disease, or unspecified chronic kidney disease: Secondary | ICD-10-CM | POA: Insufficient documentation

## 2016-11-28 DIAGNOSIS — Z87891 Personal history of nicotine dependence: Secondary | ICD-10-CM | POA: Diagnosis not present

## 2016-11-28 DIAGNOSIS — Z79899 Other long term (current) drug therapy: Secondary | ICD-10-CM | POA: Insufficient documentation

## 2016-11-28 DIAGNOSIS — M1990 Unspecified osteoarthritis, unspecified site: Secondary | ICD-10-CM | POA: Insufficient documentation

## 2016-11-28 DIAGNOSIS — I739 Peripheral vascular disease, unspecified: Secondary | ICD-10-CM | POA: Insufficient documentation

## 2016-11-28 DIAGNOSIS — L03116 Cellulitis of left lower limb: Secondary | ICD-10-CM | POA: Diagnosis not present

## 2016-11-28 DIAGNOSIS — M109 Gout, unspecified: Secondary | ICD-10-CM | POA: Diagnosis not present

## 2016-11-28 DIAGNOSIS — E785 Hyperlipidemia, unspecified: Secondary | ICD-10-CM | POA: Insufficient documentation

## 2016-11-28 DIAGNOSIS — Z955 Presence of coronary angioplasty implant and graft: Secondary | ICD-10-CM | POA: Diagnosis not present

## 2016-11-28 DIAGNOSIS — E1122 Type 2 diabetes mellitus with diabetic chronic kidney disease: Secondary | ICD-10-CM | POA: Insufficient documentation

## 2016-11-28 DIAGNOSIS — K589 Irritable bowel syndrome without diarrhea: Secondary | ICD-10-CM | POA: Insufficient documentation

## 2016-11-28 DIAGNOSIS — I350 Nonrheumatic aortic (valve) stenosis: Secondary | ICD-10-CM | POA: Diagnosis not present

## 2016-11-28 DIAGNOSIS — I2510 Atherosclerotic heart disease of native coronary artery without angina pectoris: Secondary | ICD-10-CM | POA: Diagnosis not present

## 2016-11-28 DIAGNOSIS — D500 Iron deficiency anemia secondary to blood loss (chronic): Secondary | ICD-10-CM

## 2016-11-28 DIAGNOSIS — N183 Chronic kidney disease, stage 3 unspecified: Secondary | ICD-10-CM

## 2016-11-28 LAB — CBC WITH DIFFERENTIAL/PLATELET
BASOS PCT: 2 %
Basophils Absolute: 0.1 10*3/uL (ref 0–0.1)
EOS ABS: 0.1 10*3/uL (ref 0–0.7)
EOS PCT: 1 %
HCT: 33.9 % — ABNORMAL LOW (ref 35.0–47.0)
HEMOGLOBIN: 11.4 g/dL — AB (ref 12.0–16.0)
Lymphocytes Relative: 4 %
Lymphs Abs: 0.3 10*3/uL — ABNORMAL LOW (ref 1.0–3.6)
MCH: 30.2 pg (ref 26.0–34.0)
MCHC: 33.8 g/dL (ref 32.0–36.0)
MCV: 89.4 fL (ref 80.0–100.0)
MONOS PCT: 10 %
Monocytes Absolute: 0.7 10*3/uL (ref 0.2–0.9)
NEUTROS PCT: 83 %
Neutro Abs: 6 10*3/uL (ref 1.4–6.5)
PLATELETS: 122 10*3/uL — AB (ref 150–440)
RBC: 3.79 MIL/uL — ABNORMAL LOW (ref 3.80–5.20)
RDW: 18.2 % — ABNORMAL HIGH (ref 11.5–14.5)
WBC: 7.2 10*3/uL (ref 3.6–11.0)

## 2016-11-28 LAB — FERRITIN: FERRITIN: 206 ng/mL (ref 11–307)

## 2016-11-28 MED ORDER — SODIUM CHLORIDE 0.9 % IV SOLN
510.0000 mg | Freq: Once | INTRAVENOUS | Status: AC
  Administered 2016-11-28: 510 mg via INTRAVENOUS
  Filled 2016-11-28: qty 17

## 2016-11-28 MED ORDER — HEPARIN SOD (PORK) LOCK FLUSH 100 UNIT/ML IV SOLN
500.0000 [IU] | Freq: Once | INTRAVENOUS | Status: AC
  Administered 2016-11-28: 500 [IU] via INTRAVENOUS

## 2016-11-28 MED ORDER — SODIUM CHLORIDE 0.9 % IV SOLN
Freq: Once | INTRAVENOUS | Status: AC
  Administered 2016-11-28: 14:00:00 via INTRAVENOUS
  Filled 2016-11-28: qty 1000

## 2016-12-02 ENCOUNTER — Inpatient Hospital Stay
Admit: 2016-12-02 | Discharge: 2016-12-02 | Disposition: A | Discharge status: Home / Self Care | Payer: Medicare Other | Attending: Cardiovascular Disease | Admitting: Cardiovascular Disease

## 2016-12-02 ENCOUNTER — Inpatient Hospital Stay
Admission: EM | Admit: 2016-12-02 | Discharge: 2016-12-06 | DRG: 999 | Disposition: A | Discharge status: Skilled Nursing Facility | Payer: Medicare Other | Attending: Internal Medicine | Admitting: Internal Medicine

## 2016-12-02 ENCOUNTER — Encounter
Admission: EM | Disposition: A | Discharge status: Skilled Nursing Facility | Payer: Self-pay | Source: Home / Self Care | Attending: Internal Medicine

## 2016-12-02 ENCOUNTER — Encounter: Payer: Self-pay | Admitting: Cardiovascular Disease

## 2016-12-02 DIAGNOSIS — I2119 ST elevation (STEMI) myocardial infarction involving other coronary artery of inferior wall: Secondary | ICD-10-CM | POA: Diagnosis present

## 2016-12-02 DIAGNOSIS — N184 Chronic kidney disease, stage 4 (severe): Secondary | ICD-10-CM | POA: Diagnosis present

## 2016-12-02 DIAGNOSIS — I5022 Chronic systolic (congestive) heart failure: Secondary | ICD-10-CM | POA: Diagnosis present

## 2016-12-02 DIAGNOSIS — Z825 Family history of asthma and other chronic lower respiratory diseases: Secondary | ICD-10-CM

## 2016-12-02 DIAGNOSIS — J449 Chronic obstructive pulmonary disease, unspecified: Secondary | ICD-10-CM | POA: Diagnosis present

## 2016-12-02 DIAGNOSIS — N170 Acute kidney failure with tubular necrosis: Secondary | ICD-10-CM | POA: Diagnosis present

## 2016-12-02 DIAGNOSIS — Z66 Do not resuscitate: Secondary | ICD-10-CM | POA: Diagnosis not present

## 2016-12-02 DIAGNOSIS — Z801 Family history of malignant neoplasm of trachea, bronchus and lung: Secondary | ICD-10-CM

## 2016-12-02 DIAGNOSIS — R2681 Unsteadiness on feet: Secondary | ICD-10-CM

## 2016-12-02 DIAGNOSIS — Y831 Surgical operation with implant of artificial internal device as the cause of abnormal reaction of the patient, or of later complication, without mention of misadventure at the time of the procedure: Secondary | ICD-10-CM | POA: Diagnosis present

## 2016-12-02 DIAGNOSIS — Z515 Encounter for palliative care: Secondary | ICD-10-CM

## 2016-12-02 DIAGNOSIS — K219 Gastro-esophageal reflux disease without esophagitis: Secondary | ICD-10-CM | POA: Diagnosis present

## 2016-12-02 DIAGNOSIS — L8990 Pressure ulcer of unspecified site, unspecified stage: Secondary | ICD-10-CM | POA: Diagnosis present

## 2016-12-02 DIAGNOSIS — I472 Ventricular tachycardia: Secondary | ICD-10-CM | POA: Diagnosis not present

## 2016-12-02 DIAGNOSIS — E1151 Type 2 diabetes mellitus with diabetic peripheral angiopathy without gangrene: Secondary | ICD-10-CM | POA: Diagnosis present

## 2016-12-02 DIAGNOSIS — M6281 Muscle weakness (generalized): Secondary | ICD-10-CM

## 2016-12-02 DIAGNOSIS — I429 Cardiomyopathy, unspecified: Secondary | ICD-10-CM | POA: Diagnosis present

## 2016-12-02 DIAGNOSIS — L89323 Pressure ulcer of left buttock, stage 3: Secondary | ICD-10-CM | POA: Diagnosis present

## 2016-12-02 DIAGNOSIS — I2582 Chronic total occlusion of coronary artery: Secondary | ICD-10-CM | POA: Diagnosis present

## 2016-12-02 DIAGNOSIS — I25119 Atherosclerotic heart disease of native coronary artery with unspecified angina pectoris: Secondary | ICD-10-CM | POA: Diagnosis present

## 2016-12-02 DIAGNOSIS — T82867A Thrombosis of cardiac prosthetic devices, implants and grafts, initial encounter: Secondary | ICD-10-CM | POA: Diagnosis present

## 2016-12-02 DIAGNOSIS — Z7189 Other specified counseling: Secondary | ICD-10-CM

## 2016-12-02 DIAGNOSIS — M109 Gout, unspecified: Secondary | ICD-10-CM | POA: Diagnosis present

## 2016-12-02 DIAGNOSIS — I130 Hypertensive heart and chronic kidney disease with heart failure and stage 1 through stage 4 chronic kidney disease, or unspecified chronic kidney disease: Secondary | ICD-10-CM | POA: Diagnosis present

## 2016-12-02 DIAGNOSIS — Z87891 Personal history of nicotine dependence: Secondary | ICD-10-CM

## 2016-12-02 DIAGNOSIS — I252 Old myocardial infarction: Secondary | ICD-10-CM

## 2016-12-02 DIAGNOSIS — Z820 Family history of epilepsy and other diseases of the nervous system: Secondary | ICD-10-CM

## 2016-12-02 DIAGNOSIS — I2510 Atherosclerotic heart disease of native coronary artery without angina pectoris: Secondary | ICD-10-CM

## 2016-12-02 DIAGNOSIS — Z833 Family history of diabetes mellitus: Secondary | ICD-10-CM

## 2016-12-02 DIAGNOSIS — R55 Syncope and collapse: Secondary | ICD-10-CM | POA: Diagnosis not present

## 2016-12-02 DIAGNOSIS — E1122 Type 2 diabetes mellitus with diabetic chronic kidney disease: Secondary | ICD-10-CM | POA: Diagnosis present

## 2016-12-02 DIAGNOSIS — I2111 ST elevation (STEMI) myocardial infarction involving right coronary artery: Secondary | ICD-10-CM | POA: Diagnosis present

## 2016-12-02 DIAGNOSIS — E785 Hyperlipidemia, unspecified: Secondary | ICD-10-CM | POA: Diagnosis present

## 2016-12-02 DIAGNOSIS — I080 Rheumatic disorders of both mitral and aortic valves: Secondary | ICD-10-CM | POA: Diagnosis present

## 2016-12-02 DIAGNOSIS — Z8541 Personal history of malignant neoplasm of cervix uteri: Secondary | ICD-10-CM

## 2016-12-02 DIAGNOSIS — Z8711 Personal history of peptic ulcer disease: Secondary | ICD-10-CM

## 2016-12-02 DIAGNOSIS — Z8249 Family history of ischemic heart disease and other diseases of the circulatory system: Secondary | ICD-10-CM

## 2016-12-02 HISTORY — PX: CARDIAC CATHETERIZATION: SHX172

## 2016-12-02 LAB — CREATININE, SERUM
CREATININE: 2.29 mg/dL — AB (ref 0.44–1.00)
GFR, EST AFRICAN AMERICAN: 22 mL/min — AB (ref >60)
GFR, EST NON AFRICAN AMERICAN: 19 mL/min — AB (ref >60)

## 2016-12-02 LAB — CBC
HCT: 39.6 % (ref 35.0–47.0)
Hemoglobin: 13 g/dL (ref 12.0–16.0)
MCH: 29.7 pg (ref 26.0–34.0)
MCHC: 32.9 g/dL (ref 32.0–36.0)
MCV: 90.3 fL (ref 80.0–100.0)
PLATELETS: 125 10*3/uL — AB (ref 150–440)
RBC: 4.39 MIL/uL (ref 3.80–5.20)
RDW: 18.7 % — AB (ref 11.5–14.5)
WBC: 7.9 10*3/uL (ref 3.6–11.0)

## 2016-12-02 LAB — TROPONIN I: TROPONIN I: 11.63 ng/mL — AB (ref <0.03)

## 2016-12-02 LAB — MRSA PCR SCREENING: MRSA by PCR: NEGATIVE

## 2016-12-02 LAB — GLUCOSE, CAPILLARY
GLUCOSE-CAPILLARY: 225 mg/dL — AB (ref 65–99)
GLUCOSE-CAPILLARY: 213 mg/dL — AB (ref 65–99)
GLUCOSE-CAPILLARY: 255 mg/dL — AB (ref 65–99)

## 2016-12-02 LAB — TSH: TSH: 3.549 u[IU]/mL (ref 0.350–4.500)

## 2016-12-02 SURGERY — LEFT HEART CATH AND CORONARY ANGIOGRAPHY

## 2016-12-02 MED ORDER — ACETAMINOPHEN 650 MG RE SUPP: 650.0000 mg | Freq: Four times a day (QID) | RECTAL | Status: DC | PRN

## 2016-12-02 MED ORDER — ACETAMINOPHEN 325 MG PO TABS: 650.0000 mg | ORAL_TABLET | Freq: Four times a day (QID) | ORAL | Status: DC | PRN

## 2016-12-02 MED ORDER — SODIUM CHLORIDE 0.9 % IV SOLN: 250.0000 mL | INTRAVENOUS | Status: DC | PRN

## 2016-12-02 MED ORDER — ONDANSETRON HCL 4 MG/2ML IJ SOLN
INTRAMUSCULAR | Status: AC
  Filled 2016-12-02: qty 2

## 2016-12-02 MED ORDER — TORSEMIDE 20 MG PO TABS: 100.0000 mg | ORAL_TABLET | Freq: Every day | ORAL | Status: DC

## 2016-12-02 MED ORDER — INSULIN ASPART 100 UNIT/ML ~~LOC~~ SOLN
0.0000 [IU] | Freq: Every day | SUBCUTANEOUS | Status: DC
  Administered 2016-12-02: 3 [IU] via SUBCUTANEOUS
  Administered 2016-12-05: 2 [IU] via SUBCUTANEOUS
  Filled 2016-12-02 (×2): qty 3

## 2016-12-02 MED ORDER — HEPARIN SODIUM (PORCINE) 1000 UNIT/ML IJ SOLN
INTRAMUSCULAR | Status: AC
  Filled 2016-12-02: qty 1

## 2016-12-02 MED ORDER — DIMENHYDRINATE 50 MG/ML IJ SOLN
INTRAMUSCULAR | Status: DC | PRN
  Administered 2016-12-02: 50 mg via INTRAVENOUS

## 2016-12-02 MED ORDER — SODIUM CHLORIDE 0.9% FLUSH
3.0000 mL | Freq: Two times a day (BID) | INTRAVENOUS | Status: DC
  Administered 2016-12-02 – 2016-12-06 (×8): 3 mL via INTRAVENOUS

## 2016-12-02 MED ORDER — NITROGLYCERIN 5 MG/ML IV SOLN
INTRAVENOUS | Status: AC
  Filled 2016-12-02: qty 10

## 2016-12-02 MED ORDER — SODIUM CHLORIDE 0.9 % IV SOLN
INTRAVENOUS | Status: DC
  Administered 2016-12-02 (×2): via INTRAVENOUS

## 2016-12-02 MED ORDER — TICAGRELOR 90 MG PO TABS
ORAL_TABLET | ORAL | Status: AC
  Filled 2016-12-02: qty 2

## 2016-12-02 MED ORDER — ASPIRIN 81 MG PO CHEW
81.0000 mg | CHEWABLE_TABLET | Freq: Every day | ORAL | Status: DC
  Administered 2016-12-03 – 2016-12-06 (×4): 81 mg via ORAL
  Filled 2016-12-02 (×5): qty 1

## 2016-12-02 MED ORDER — ONDANSETRON HCL 4 MG PO TABS
4.0000 mg | ORAL_TABLET | Freq: Four times a day (QID) | ORAL | Status: DC | PRN
  Filled 2016-12-02: qty 1

## 2016-12-02 MED ORDER — METHYLPREDNISOLONE SODIUM SUCC 125 MG IJ SOLR
INTRAMUSCULAR | Status: AC
  Filled 2016-12-02: qty 2

## 2016-12-02 MED ORDER — CLOPIDOGREL BISULFATE 75 MG PO TABS
ORAL_TABLET | ORAL | Status: AC
  Filled 2016-12-02: qty 8

## 2016-12-02 MED ORDER — ASPIRIN 81 MG PO CHEW
CHEWABLE_TABLET | ORAL | Status: DC | PRN
  Administered 2016-12-02: 324 mg via ORAL

## 2016-12-02 MED ORDER — ACETAMINOPHEN 325 MG PO TABS: 650.0000 mg | ORAL_TABLET | ORAL | Status: DC | PRN

## 2016-12-02 MED ORDER — HEPARIN SODIUM (PORCINE) 1000 UNIT/ML IJ SOLN
INTRAMUSCULAR | Status: DC | PRN
  Administered 2016-12-02: 3000 [IU] via INTRAVENOUS

## 2016-12-02 MED ORDER — PANTOPRAZOLE SODIUM 40 MG PO TBEC
40.0000 mg | DELAYED_RELEASE_TABLET | Freq: Two times a day (BID) | ORAL | Status: DC
  Administered 2016-12-02 – 2016-12-06 (×8): 40 mg via ORAL
  Filled 2016-12-02 (×8): qty 1

## 2016-12-02 MED ORDER — DIPHENHYDRAMINE HCL 50 MG/ML IJ SOLN
INTRAMUSCULAR | Status: AC
  Filled 2016-12-02: qty 1

## 2016-12-02 MED ORDER — ONDANSETRON HCL 4 MG/2ML IJ SOLN
INTRAMUSCULAR | Status: DC | PRN
  Administered 2016-12-02: 4 mg via INTRAVENOUS

## 2016-12-02 MED ORDER — SODIUM CHLORIDE 0.9 % IV SOLN: INTRAVENOUS | Status: AC

## 2016-12-02 MED ORDER — INSULIN ASPART 100 UNIT/ML ~~LOC~~ SOLN
0.0000 [IU] | Freq: Three times a day (TID) | SUBCUTANEOUS | Status: DC
  Administered 2016-12-02: 3 [IU] via SUBCUTANEOUS
  Administered 2016-12-03: 2 [IU] via SUBCUTANEOUS
  Administered 2016-12-03 (×2): 1 [IU] via SUBCUTANEOUS
  Administered 2016-12-04: 2 [IU] via SUBCUTANEOUS
  Administered 2016-12-04: 3 [IU] via SUBCUTANEOUS
  Administered 2016-12-05 – 2016-12-06 (×4): 1 [IU] via SUBCUTANEOUS
  Administered 2016-12-06: 2 [IU] via SUBCUTANEOUS
  Administered 2016-12-06: 1 [IU] via SUBCUTANEOUS
  Filled 2016-12-02: qty 2
  Filled 2016-12-02 (×2): qty 3
  Filled 2016-12-02 (×7): qty 1
  Filled 2016-12-02 (×2): qty 2
  Filled 2016-12-02: qty 3

## 2016-12-02 MED ORDER — ENOXAPARIN SODIUM 40 MG/0.4ML ~~LOC~~ SOLN: 40.0000 mg | SUBCUTANEOUS | Status: DC

## 2016-12-02 MED ORDER — ATROPINE SULFATE 1 MG/10ML IJ SOSY
PREFILLED_SYRINGE | INTRAMUSCULAR | Status: AC
  Filled 2016-12-02: qty 10

## 2016-12-02 MED ORDER — CLOPIDOGREL BISULFATE 75 MG PO TABS: 75.0000 mg | ORAL_TABLET | Freq: Every day | ORAL | Status: DC

## 2016-12-02 MED ORDER — ASPIRIN 81 MG PO CHEW
CHEWABLE_TABLET | ORAL | Status: AC
  Filled 2016-12-02: qty 4

## 2016-12-02 MED ORDER — ORAL CARE MOUTH RINSE
15.0000 mL | Freq: Two times a day (BID) | OROMUCOSAL | Status: DC
  Administered 2016-12-02 – 2016-12-05 (×4): 15 mL via OROMUCOSAL

## 2016-12-02 MED ORDER — PANTOPRAZOLE SODIUM 40 MG PO TBEC
40.0000 mg | DELAYED_RELEASE_TABLET | Freq: Two times a day (BID) | ORAL | Status: DC
  Filled 2016-12-02: qty 1

## 2016-12-02 MED ORDER — IOPAMIDOL (ISOVUE-370) INJECTION 76%
INTRAVENOUS | Status: DC | PRN
  Administered 2016-12-02: 75 mL via INTRA_ARTERIAL

## 2016-12-02 MED ORDER — CLOPIDOGREL BISULFATE 300 MG PO TABS
ORAL_TABLET | ORAL | Status: DC | PRN
  Administered 2016-12-02: 600 mg via ORAL

## 2016-12-02 MED ORDER — METHYLPREDNISOLONE SODIUM SUCC 125 MG IJ SOLR
INTRAMUSCULAR | Status: DC | PRN
  Administered 2016-12-02: 125 mg via INTRAVENOUS

## 2016-12-02 MED ORDER — HEPARIN SODIUM (PORCINE) 5000 UNIT/ML IJ SOLN
5000.0000 [IU] | Freq: Three times a day (TID) | INTRAMUSCULAR | Status: DC
Start: 2016-12-02 — End: 2016-12-06
  Administered 2016-12-02 – 2016-12-06 (×12): 5000 [IU] via SUBCUTANEOUS
  Filled 2016-12-02 (×14): qty 1

## 2016-12-02 MED ORDER — HYDROCODONE-ACETAMINOPHEN 5-325 MG PO TABS
1.0000 | ORAL_TABLET | ORAL | Status: DC | PRN
Start: 2016-12-02 — End: 2016-12-06

## 2016-12-02 MED ORDER — ONDANSETRON HCL 4 MG/2ML IJ SOLN
4.0000 mg | Freq: Four times a day (QID) | INTRAMUSCULAR | Status: DC | PRN
  Administered 2016-12-03: 4 mg via INTRAVENOUS
  Filled 2016-12-02: qty 2

## 2016-12-02 MED ORDER — METOPROLOL SUCCINATE ER 25 MG PO TB24
25.0000 mg | ORAL_TABLET | Freq: Every day | ORAL | Status: DC
  Administered 2016-12-04: 25 mg via ORAL
  Filled 2016-12-02 (×3): qty 1

## 2016-12-02 MED ORDER — METOPROLOL SUCCINATE ER 50 MG PO TB24: 50.0000 mg | ORAL_TABLET | Freq: Every day | ORAL | Status: DC

## 2016-12-02 MED ORDER — CLOPIDOGREL BISULFATE 75 MG PO TABS
75.0000 mg | ORAL_TABLET | Freq: Every day | ORAL | Status: DC
  Administered 2016-12-03 – 2016-12-06 (×4): 75 mg via ORAL
  Filled 2016-12-02 (×4): qty 1

## 2016-12-02 MED ORDER — ROSUVASTATIN CALCIUM 20 MG PO TABS: 40.0000 mg | ORAL_TABLET | Freq: Every day | ORAL | Status: DC

## 2016-12-02 MED ORDER — ONDANSETRON HCL 4 MG/2ML IJ SOLN: 4.0000 mg | Freq: Four times a day (QID) | INTRAMUSCULAR | Status: DC | PRN

## 2016-12-02 MED ORDER — SODIUM CHLORIDE 0.9% FLUSH: 3.0000 mL | INTRAVENOUS | Status: DC | PRN

## 2016-12-02 MED ORDER — SODIUM CHLORIDE 0.9% FLUSH
3.0000 mL | Freq: Two times a day (BID) | INTRAVENOUS | Status: DC
  Administered 2016-12-02 – 2016-12-06 (×7): 3 mL via INTRAVENOUS

## 2016-12-02 MED ORDER — SPIRONOLACTONE 25 MG PO TABS: 25.0000 mg | ORAL_TABLET | Freq: Every day | ORAL | Status: DC

## 2016-12-02 MED ORDER — LISINOPRIL 10 MG PO TABS: 40.0000 mg | ORAL_TABLET | Freq: Every day | ORAL | Status: DC

## 2016-12-02 MED ORDER — ASPIRIN EC 81 MG PO TBEC: 81.0000 mg | DELAYED_RELEASE_TABLET | Freq: Every day | ORAL | Status: DC

## 2016-12-02 MED ORDER — SIMVASTATIN 20 MG PO TABS
20.0000 mg | ORAL_TABLET | Freq: Every day | ORAL | Status: DC
  Administered 2016-12-02 – 2016-12-04 (×3): 20 mg via ORAL
  Filled 2016-12-02 (×3): qty 1

## 2016-12-02 SURGICAL SUPPLY — 14 items
BALLN TREK RX 2.5X20 (BALLOONS) ×3
BALLN ~~LOC~~ TREK RX 2.75X15 (BALLOONS) ×3
BALLOON TREK RX 2.5X20 (BALLOONS) ×1 IMPLANT
BALLOON ~~LOC~~ TREK RX 2.75X15 (BALLOONS) ×1 IMPLANT
CATH INFINITI 5 FR JL3.5 (CATHETERS) ×3 IMPLANT
CATH VISTA GUIDE 6FR JR4 (CATHETERS) ×3 IMPLANT
DEVICE INFLAT 30 PLUS (MISCELLANEOUS) ×3 IMPLANT
DEVICE RAD TR BAND REGULAR (VASCULAR PRODUCTS) ×3 IMPLANT
GLIDESHEATH SLEND SS 6F .021 (SHEATH) ×3 IMPLANT
KIT MANI 3VAL PERCEP (MISCELLANEOUS) ×3 IMPLANT
PACK CARDIAC CATH (CUSTOM PROCEDURE TRAY) ×3 IMPLANT
WIRE HITORQ VERSACORE ST 145CM (WIRE) ×3 IMPLANT
WIRE ROSEN-J .035X260CM (WIRE) ×3 IMPLANT
WIRE RUNTHROUGH .014X180CM (WIRE) ×3 IMPLANT

## 2016-12-02 NOTE — Consult Note (Addendum)
[place] Nurse wound consult note Reason for Consult: Bilateral Unnas boots, changed on Thursdays.  Intact at this time.  No ulcers present, per patient.  Fully epithelialized stage 3 pressure injury.  Has reopened in last week, per patient.  Wound type:Pressure Venous insufficiency (with CAD )  Pressure Ulcer POA: Yes Measurement: Left buttocks, 2 cm x 2 cm pink scar with nonintact center measuring 1 cm x 1 cm x 0.1 cm  Wound ERD:EYCX and moist Drainage (amount, consistency, odor) Minimal serosanguinous Periwound:Scar tissue circumferentially. Dressing procedure/placement/frequency:Cleanse left buttocks wound with soap and water.  Apply silicone border foam dressing.  Change every 3 days and PRN soilage.   Unnas boots replaced on Thursday if still inpatient.  [place] team will follow.  Domenic Moras RN BSN [place] Pager [phone]

## 2016-12-02 NOTE — Progress Notes (Signed)
Notified MD Fletcher Anon that after deflation of TR band patient was rolled over in bed to change soiled linen and site began to bleed under TR band. I re inflated TR band with 5cc of air. MD Fletcher Anon stated to leave 5cc of air in place for 30 minutes and slowly start deflating TR band again. Will continue to monitor patient.

## 2016-12-02 NOTE — Progress Notes (Signed)
Patient placed on warming blanket due to hypothermia, rectal temp probe placed to monitor core temperature. Will continue to monitor patient.

## 2016-12-02 NOTE — Progress Notes (Signed)
CH was visiting with Pt in [place], when the Nurse asked him to check on this Pt in [place]. This was code stemi, Pt was having a massive heart attack, the Dc briefed the son on the Pt's condition, CH went out to identify the rest of family member, a total of 9, and directed them to the [place] family room while the Pt was having a procedure done. After the procedure, the Dc briefed the family again, and asked [place] to lead the family the ICU family Rm, which the [place] did, and then informed the family that the [facility] was around if he is needed and left. The Pt is currently in the ICU.    12/02/16 1300  Clinical Encounter Type  Visited With Patient;Patient and family together  Visit Type Initial;Follow-up;Spiritual support;Code;ED;Trauma  Referral From Nurse  Consult/Referral To Chaplain  Spiritual Encounters  Spiritual Needs Prayer;Emotional;Other (Comment)

## 2016-12-02 NOTE — Progress Notes (Signed)
Family Meeting Note  Advance Directive:no  Today a meeting took place with the Patient.    The following clinical team members were present during this meeting:MD  The following were discussed:Patient's diagnosis:STEMI hx AVm now on ASA/plavix  Patient's progosis: Unable to determine and Goals for treatment: DNR  Additional follow-up to be provided: none  Time spent during discussion:16 minutes  Morgan Gervasi, MD

## 2016-12-02 NOTE — ED Triage Notes (Signed)
Pt to ED from home c/o chest pain. EMS reports nausea and vomittng with ST elevation.

## 2016-12-02 NOTE — Progress Notes (Signed)
[place]  SUBJECTIVE: mild neck pain. S/P imi stemi with poba of stents in rca   Vitals:   12/02/16 1049  SpO2: 100%   No intake or output data in the 24 hours ending 12/02/16 1259  LABS: Basic Metabolic Panel: No results for input(s): NA, K, CL, CO2, GLUCOSE, BUN, CREATININE, CALCIUM, MG, PHOS in the last 72 hours. Liver Function Tests: No results for input(s): AST, ALT, ALKPHOS, BILITOT, PROT, ALBUMIN in the last 72 hours. No results for input(s): LIPASE, AMYLASE in the last 72 hours. CBC:  Recent Labs  12/02/16 1153  WBC 7.9  HGB 13.0  HCT 39.6  MCV 90.3  PLT 125*   Cardiac Enzymes: No results for input(s): CKTOTAL, CKMB, CKMBINDEX, TROPONINI in the last 72 hours. BNP: Invalid input(s): POCBNP D-Dimer: No results for input(s): DDIMER in the last 72 hours. Hemoglobin A1C: No results for input(s): HGBA1C in the last 72 hours. Fasting Lipid Panel: No results for input(s): CHOL, HDL, LDLCALC, TRIG, CHOLHDL, LDLDIRECT in the last 72 hours. Thyroid Function Tests: No results for input(s): TSH, T4TOTAL, T3FREE, THYROIDAB in the last 72 hours.  Invalid input(s): FREET3 Anemia Panel: No results for input(s): VITAMINB12, FOLATE, FERRITIN, TIBC, IRON, RETICCTPCT in the last 72 hours.   Physical Exam: SpO2 100 %.   Wt Readings from Last 1 Encounters:  09/26/16 200 lb 11.2 oz (91 kg)     General appearance: alert and cooperative Resp: clear to auscultation bilaterally Chest wall: right sided chest wall tenderness, left sided chest wall tenderness Cardio: regular rate and rhythm GI: soft, non-tender; bowel sounds normal; no masses,  no organomegaly Extremities: extremities normal, atraumatic, no cyanosis or edema Neurologic: Grossly normal  TELEMETRY: Reviewed telemetry pt in nsr :  ASSESSMENT AND PLAN:  Active Problems:   STEMI involving right coronary artery ([place])- pt with history of moderate to severe aortic stenosis  not felt to be a surgical candidate, moderate to severe mitral regurgitation with ef of 55%, history of    ST elevation myocardial infarction (STEMI) of inferior wall (H[facility]) s/p pci, history of gi bleeds due to avm, history of chronic renal insuffiency who presented to the er with complaints of chest pain and was noted to have inferior st elevation c/w stemi. She underwent urgernt cardiac cath and was found to have acute thrombosis of stents in her rca at the site of overlap. She was treated with POBA due to location of lesion and future difficulty in using antiplatelet therapy due to recurrent gi bleeds. She is currently hemodynamically stable. Will attempt to continue with asa and plavix for 4 weeks and then swith to single agent. Pt has multiple comorbid conditions and per family, is having some issues thriving at home. May need at least short term rehab after hospitalization. Not able to use ace I or beta blockers due to hypotension. Will follow with you.     Teodoro Spray, MD, [facility] 12/02/2016 12:59 PM

## 2016-12-02 NOTE — H&P (Addendum)
[facility] NAME: Morgan [name]    MR#:  [id number]  DATE OF BIRTH:  Sep 03, 1935  DATE OF ADMISSION:  12/02/2016  PRIMARY CARE PHYSICIAN: Kirk Ruths., MD   REQUESTING/REFERRING PHYSICIAN: Dr Nathaniel Man  CHIEF COMPLAINT:   STEMI HISTORY OF PRESENT ILLNESS:  Morgan [name]  is a 81 y.o. female with a known history of CAD with right coronary artery stent and GI bleed due to AV malformations who presents with chest pain. Patient reports sudden onset of substernal chest pain which was not radiating this morning which woke her up from sleep. Her pain was initially 10 out of 10. She was found to have an ST elevation MI. She was brought to the cardiac catheter lab where she was found to have a thrombosed RCA stent. She underwent balloon angioplasty and now she is being admitted to the hospital.  PAST MEDICAL HISTORY:   Past Medical History:  Diagnosis Date  . Abnormal LFTs (liver function tests)   . Adenomatous polyps 03/05/2012  . Angiodysplasia   . Aortic stenosis   . Arthritis   . AV malformation of GI tract   . Bilateral cellulitis of lower leg   . CAD (coronary artery disease)   . Cardiomyopathy ([place])   . Carotid bruit   . Cervical cancer ([place])    cervical cancer history  . CHF (congestive heart failure) ([place])   . Chronic edema   . Clostridium difficile infection March 02, 2015  . Complication of anesthesia     I have trouble waking up "  . COPD (chronic obstructive pulmonary disease) ([place])   . Diabetes mellitus with stage 3 chronic kidney disease ([place])   . Fatty liver   . Fibrocystic breast changes   . GERD (gastroesophageal reflux disease)   . GI bleed   . Gout   . History of blood transfusion   . History of nicotine use   . Hyperlipidemia   . Hypertension   . Iron deficiency anemia 2008  . Irritable bowel syndrome   . Macrocytosis   . Osteoarthritis   . Peptic ulcer disease   . PVD (peripheral vascular  disease) ([place])   . Tobacco abuse   . Trigeminal neuralgia   . Vascular malformation 06/14/1997    PAST SURGICAL HISTORY:   Past Surgical History:  Procedure Laterality Date  . ABDOMINAL HYSTERECTOMY    . angiogram cardiac stenting    . BONE MARROW BIOPSY  1999  . CHOLECYSTECTOMY    . COLONOSCOPY  12/2014, 2014, 2013, 2008, 2004   03/05/2012-Adenomatous Polyps;09/23/2003-Polyp remains intact;   . COLONOSCOPY N/A 07/12/2016   Procedure: COLONOSCOPY;  Surgeon: Ronald Lobo, MD;  Location: [facility] END[facility]COPY;  Service: Endoscopy;  Laterality: N/A;  . COLONOSCOPY N/A 08/23/2016   Procedure: COLONOSCOPY;  Surgeon: Lucilla Lame, MD;  Location: [facility] ENDOSCOPY;  Service: Endoscopy;  Laterality: N/A;  . Endoscopic Carpal Tunnel Release    . ESOPHAGOGASTRODUODENOSCOPY  12/2014, 2014, 2013, 2009, 2004  . ESOPHAGOGASTRODUODENOSCOPY (EGD) WITH PROPOFOL N/A 07/12/2016   Procedure: ESOPHAGOGASTRODUODENOSCOPY (EGD) WITH PROPOFOL;  Surgeon: Ronald Lobo, MD;  Location: [place];  Service: Endoscopy;  Laterality: N/A;  . ESOPHAGOGASTRODUODENOSCOPY (EGD) WITH PROPOFOL N/A 08/23/2016   Procedure: ESOPHAGOGASTRODUODENOSCOPY (EGD) WITH PROPOFOL;  Surgeon: Lucilla Lame, MD;  Location: [facility] ENDOSCOPY;  Service: Endoscopy;  Laterality: N/A;  . HOT HEMOSTASIS N/A 07/12/2016   Procedure: HOT HEMOSTASIS (ARGON PLASMA COAGULATION/BICAP);  Surgeon: Ronald Lobo, MD;  Location: [facility] ENDOSCOPY;  Service: Endoscopy;  Laterality: N/A;  . NASAL SINUS SURGERY    . TEE WITHOUT CARDIOVERSION N/A 12/19/2015   Procedure: TRANSESOPHAGEAL ECHOCARDIOGRAM (TEE);  Surgeon: Yolonda Kida, MD;  Location: [facility] ORS;  Service: Cardiovascular;  Laterality: N/A;  . THORACENTESIS    . TONSILLECTOMY      SOCIAL HISTORY:   Social History  Substance Use Topics  . Smoking status: Former Smoker    Packs/day: 0.50    Years: 56.00    Types: Cigarettes    Quit date: 05/23/2013  . Smokeless tobacco: Never Used  . Alcohol use No    FAMILY  HISTORY:   Family History  Problem Relation Age of Onset  . Parkinsonism Mother   . Heart attack Mother   . Lung cancer Father   . Diabetes Mellitus II Father   . Asthma Sister   . Asthma Brother   . Heart disease Son     DRUG ALLERGIES:   Allergies  Allergen Reactions  . Aspirin Other (See Comments)    bleeding  . Atenolol Other (See Comments)    fatigue  . Clopidogrel Bisulfate Other (See Comments)    bleeding  . Darvon [Propoxyphene] Hives and Other (See Comments)  . Hydrochlorothiazide Other (See Comments)    Fatigue and cramps  . Iodinated Diagnostic Agents Other (See Comments)    Questionable GI bleed  . Nylon Hives  . Other Other (See Comments)    Nylon sutures  . Pravastatin Other (See Comments)    Severe cramping    REVIEW OF SYSTEMS:   Review of Systems  Constitutional: Negative.  Negative for chills, fever and malaise/fatigue.  HENT: Negative.  Negative for ear discharge, ear pain, hearing loss, nosebleeds and sore throat.   Eyes: Negative.  Negative for blurred vision and pain.  Respiratory: Positive for shortness of breath. Negative for cough, hemoptysis and wheezing.   Cardiovascular: Positive for chest pain. Negative for palpitations and leg swelling.  Gastrointestinal: Negative.  Negative for abdominal pain, blood in stool, diarrhea, nausea and vomiting.  Genitourinary: Negative.  Negative for dysuria.  Musculoskeletal: Negative.  Negative for back pain.  Skin: Negative.   Neurological: Negative for dizziness, tremors, speech change, focal weakness, seizures and headaches.  Endo/Heme/Allergies: Negative.  Does not bruise/bleed easily.  Psychiatric/Behavioral: Negative.  Negative for depression, hallucinations and suicidal ideas.    MEDICATIONS AT HOME:   Prior to Admission medications   Medication Sig Start Date End Date Taking? Authorizing Provider  acetaminophen (TYLENOL) 500 MG tablet Take 500 mg by mouth at bedtime.   Yes Historical  Provider, MD  diclofenac sodium (VOLTAREN) 1 % GEL Apply 2 g topically 4 (four) times daily as needed. For pain   Yes Historical Provider, MD  lisinopril (PRINIVIL,ZESTRIL) 40 MG tablet Take 40 mg by mouth daily.   Yes Historical Provider, MD  metolazone (ZAROXOLYN) 10 MG tablet Take 10 mg by mouth daily as needed. For fluid/swelling   Yes Historical Provider, MD  metoprolol succinate (TOPROL-XL) 50 MG 24 hr tablet Take 50 mg by mouth daily.    Yes Historical Provider, MD  pantoprazole (PROTONIX) 40 MG tablet Take 40 mg by mouth 2 (two) times daily.    Yes Historical Provider, MD  potassium chloride (K-DUR) 10 MEQ tablet Take 10 mEq by mouth 2 (two) times daily.   Yes Historical Provider, MD  simvastatin (ZOCOR) 20 MG tablet Take 20 mg by mouth at bedtime.    Yes Historical Provider, MD  spironolactone (ALDACTONE) 25 MG tablet Take 25  mg by mouth daily.   Yes Historical Provider, MD  torsemide (DEMADEX) 100 MG tablet Take 100 mg by mouth daily.   Yes Historical Provider, MD  amLODipine (NORVASC) 2.5 MG tablet Take 1 tablet (2.5 mg total) by mouth daily. Patient not taking: Reported on 12/02/2016 08/24/16   Hillary Bow, MD      VITAL SIGNS:  SpO2 100 %.  PHYSICAL EXAMINATION:   Physical Exam  Constitutional: She is oriented to person, place, and time and well-developed, well-nourished, and in no distress. No distress.  HENT:  Head: Normocephalic.  Eyes: No scleral icterus.  Neck: Normal range of motion. Neck supple. No JVD present. No tracheal deviation present.  Cardiovascular: Normal rate, regular rhythm and normal heart sounds.  Exam reveals no gallop and no friction rub.   No murmur heard. Pulmonary/Chest: Effort normal and breath sounds normal. No respiratory distress. She has no wheezes. She has no rales. She exhibits no tenderness.  Abdominal: Soft. Bowel sounds are normal. She exhibits no distension and no mass. There is no tenderness. There is no rebound and no guarding.   Musculoskeletal: Normal range of motion. She exhibits no edema.  Neurological: She is alert and oriented to person, place, and time.  Skin: Skin is warm. No rash noted. No erythema.  Psychiatric: Affect normal.      LABORATORY PANEL:   CBC  Recent Labs Lab 11/28/16 1315  WBC 7.2  HGB 11.4*  HCT 33.9*  PLT 122*   ------------------------------------------------------------------------------------------------------------------  Chemistries  No results for input(s): NA, K, CL, CO2, GLUCOSE, BUN, CREATININE, CALCIUM, MG, AST, ALT, ALKPHOS, BILITOT in the last 168 hours.  Invalid input(s): GFRCGP ------------------------------------------------------------------------------------------------------------------  Cardiac Enzymes No results for input(s): TROPONINI in the last 168 hours. ------------------------------------------------------------------------------------------------------------------  RADIOLOGY:  No results found.  EKG:  ST elevation in inferior leads  IMPRESSION AND PLAN:   80 year old female with a history of multiple AVMs with GI bleed not on aspirin and Plavix due to complications of severe bleeding who presents with ST elevation MI status post cardiac catheterization which shows thrombosed RCA stent.   1. ST elevation MI: Patient is status post cardiac catheterization with RCA stent thrombosis. As per Dr.Arida, start aspirin and Plavix. The patient has history of recurrent GI bleed. She should be treated with aspirin and Plavix for at least one month and if tolerated she should stay on at least one agent after that. She is at high risk for acute stent thrombosis. Unfortunately, she is not a good candidate for a 2 B 3A inhibitor due to prohibitive bleeding risk. Continue statin and metoprolol. Due to low blood pressure will hold on ACE inhibitor.    2. History of GI bleeds with AV malformations: Monitor CBC closely on aspirin and Plavix.    3.  Hyperlipidemia: Continue statin and check lipid panel.  All the records are reviewed and case discussed with DrArida Management plans discussed with the patient and she in agreement  CODE STATUS: DNR  TOTAL TIME TAKING CARE OF THIS PATIENT: 50 minutes.    Katalin Colledge M.D on 12/02/2016 at 11:41 AM  Between 7am to 6pm - Pager - [phone]  After 6pm go to [email or web address] - password [email or web address]  Office  [phone]  CC: Primary care physician; Kirk Ruths., MD

## 2016-12-02 NOTE — ED Provider Notes (Signed)
[facility] Emergency Department Provider Note  ____________________________________________  Time seen: Approximately 10:04 AM  I have reviewed the triage vital signs and the nursing notes.   HISTORY  Chief Complaint Chest pain Level 5 caveat:  Portions of the history and physical were unable to be obtained due to the patient's acute illness   HPI Morgan [name] is a 81 y.o. female who presents with sudden onset of anterior chest pain described as pressure, nonradiating associated with vomiting, started about 45 minutes prior to arrival in the emergency department. EMS arrived on scene, noted ST elevation in the inferior leads. Prior to arrival in the ED they did contact care Link to activate a code STEMI.  Cardiology Dr. Fletcher Anon arrived to patient's bedside in the ED within 5 minutes of ED arrival while I was still performing my initial assessment..Patient reports that she was recently diagnosed with a double coronary artery blockage just a week ago.  EMS reported an aversion to aspirin due to severe bleeding complications in the past. They've not given nitroglycerin due to borderline low blood pressures of about 818 systolic.  Patient denies dizziness shortness of breath.     Past Medical History:  Diagnosis Date  . Abnormal LFTs (liver function tests)   . Adenomatous polyps 03/05/2012  . Angiodysplasia   . Aortic stenosis   . Arthritis   . AV malformation of GI tract   . Bilateral cellulitis of lower leg   . CAD (coronary artery disease)   . Cardiomyopathy ([place])   . Carotid bruit   . Cervical cancer ([place])    cervical cancer history  . CHF (congestive heart failure) ([place])   . Chronic edema   . Clostridium difficile infection March 02, 2015  . Complication of anesthesia     I have trouble waking up "  . COPD (chronic obstructive pulmonary disease) ([place])   . Diabetes mellitus with stage 3 chronic kidney disease ([place])   . Fatty liver   .  Fibrocystic breast changes   . GERD (gastroesophageal reflux disease)   . GI bleed   . Gout   . History of blood transfusion   . History of nicotine use   . Hyperlipidemia   . Hypertension   . Iron deficiency anemia 2008  . Irritable bowel syndrome   . Macrocytosis   . Osteoarthritis   . Peptic ulcer disease   . PVD (peripheral vascular disease) ([place])   . Tobacco abuse   . Trigeminal neuralgia   . Vascular malformation 06/14/1997     Patient Active Problem List   Diagnosis Date Noted  . Anemia due to stage 3 chronic kidney disease 09/26/2016  . Iron deficiency anemia, unspecified   . Acute posthemorrhagic anemia   . Blood in stool   . Benign neoplasm of colon   . Angiodysplasia of intestine with hemorrhage   . GIB (gastrointestinal bleeding) 08/22/2016  . AKI (acute kidney injury) ([place]) 08/22/2016  . Rectal bleeding   . Pressure ulcer 07/10/2016  . Thrombocytopenia ([place]) 07/09/2016  . Anemia of chronic kidney failure 06/24/2016  . Iron deficiency anemia due to chronic blood loss 04/29/2016  . Angiodysplasia 05/19/2015  . CA cervix ([place]) 05/19/2015  . Fatty infiltration of liver 05/19/2015  . Increased MCV 05/19/2015  . Gastroduodenal ulcer 05/19/2015  . Hypercholesterolemia without hypertriglyceridemia 03/06/2015  . Diabetes ([place]) 10/25/2014  . Absolute anemia 06/18/2014  . Aortic heart valve narrowing 06/18/2014  . 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 Arteriosclerosis of  coronary artery 06/18/2014  . Cardiomyopathy ([place]) 06/18/2014  . Chronic kidney disease (CKD), stage III (moderate) 06/18/2014  . CAFL (chronic airflow limitation) ([place]) 06/18/2014  . Type II diabetes mellitus with neurological manifestations ([place]) 06/18/2014  . Gout 06/18/2014  . Benign hypertension 06/18/2014  . Fothergill's neuralgia 06/18/2014     Past Surgical History:  Procedure Laterality Date  . ABDOMINAL HYSTERECTOMY    . angiogram cardiac stenting    . BONE MARROW BIOPSY  1999  .  CHOLECYSTECTOMY    . COLONOSCOPY  12/2014, 2014, 2013, 2008, 2004   03/05/2012-Adenomatous Polyps;09/23/2003-Polyp remains intact;   . COLONOSCOPY N/A 07/12/2016   Procedure: COLONOSCOPY;  Surgeon: Ronald Lobo, MD;  [facility] ENDOSCOPY;  Service: Endoscopy;  Laterality: N/A;  . COLONOSCOPY N/A 08/23/2016   Procedure: COLONOSCOPY;  Surgeon: Lucilla Lame, MD;  Location: [facility] ENDOSCOPY;  Service: Endoscopy;  Laterality: N/A;  . Endoscopic Carpal Tunnel Release    . ESOPHAGOGASTRODUODENOSCOPY  12/2014, 2014, 2013, 2009, 2004  . ESOPHAGOGASTRODUODENOSCOPY (EGD) WITH PROPOFOL N/A 07/12/2016   Procedure: ESOPHAGOGASTRODUODENOSCOPY (EGD) WITH PROPOFOL;  Surgeon: Ronald Lobo, MD;  Location: [place];  Service: Endoscopy;  Laterality: N/A;  . ESOPHAGOGASTRODUODENOSCOPY (EGD) WITH PROPOFOL N/A 08/23/2016   Procedure: ESOPHAGOGASTRODUODENOSCOPY (EGD) WITH PROPOFOL;  Surgeon: Lucilla Lame, MD;  Location: [facility] ENDOSCOPY;  Service: Endoscopy;  Laterality: N/A;  . HOT HEMOSTASIS N/A 07/12/2016   Procedure: HOT HEMOSTASIS (ARGON PLASMA COAGULATION/BICAP);  Surgeon: Ronald Lobo, MD;  Location[facility] ENDOSCOPY;  Service: Endoscopy;  Laterality: N/A;  . NASAL SINUS SURGERY    . TEE WITHOUT CARDIOVERSION N/A 12/19/2015   Procedure: TRANSESOPHAGEAL ECHOCARDIOGRAM (TEE);  Surgeon: Yolonda Kida, MD;  Location: [facility] ORS;  Service: Cardiovascular;  Laterality: N/A;  . THORACENTESIS    . TONSILLECTOMY       Prior to Admission medications   Medication Sig Start Date End Date Taking? Authorizing Provider  acetaminophen (TYLENOL) 500 MG tablet Take 500 mg by mouth at bedtime.    Historical Provider, MD  amLODipine (NORVASC) 2.5 MG tablet Take 1 tablet (2.5 mg total) by mouth daily. 08/24/16   Srikar Sudini, MD  lisinopril (PRINIVIL,ZESTRIL) 40 MG tablet Take 40 mg by mouth daily.    Historical Provider, MD  metoprolol succinate (TOPROL-XL) 50 MG 24 hr tablet Take 50 mg by mouth daily.     Historical Provider, MD   pantoprazole (PROTONIX) 40 MG tablet Take 40 mg by mouth daily.    Historical Provider, MD  simvastatin (ZOCOR) 20 MG tablet Take 20 mg by mouth at bedtime.     Historical Provider, MD  torsemide (DEMADEX) 20 MG tablet Take 1 tablet by mouth 4 (four) times daily.     Historical Provider, MD     Allergies Aspirin; Atenolol; Clopidogrel bisulfate; Darvon [propoxyphene]; Hydrochlorothiazide; Iodinated diagnostic agents; Nylon; Other; and Pravastatin   Family History  Problem Relation Age of Onset  . Parkinsonism Mother   . Heart attack Mother   . Lung cancer Father   . Diabetes Mellitus II Father   . Asthma Sister   . Asthma Brother   . Heart disease Son     Social History Social History  Substance Use Topics  . Smoking status: Former Smoker    Packs/day: 0.50    Years: 56.00    Types: Cigarettes    Quit date: 05/23/2013  . Smokeless tobacco: Never Used  . Alcohol use No    Review of Systems   Cardiovascular:   Positive as above chest pain. Respiratory:  No dyspnea or cough. Gastrointestinal:   Negative for abdominal pain, vomiting and diarrhea.  10-point ROS otherwise negative.  ____________________________________________   PHYSICAL EXAM:  VITAL SIGNS: ED Triage Vitals  Enc Vitals Group     BP      Pulse      Resp      Temp      Temp src      SpO2      Weight      Height      Head Circumference      Peak Flow      Pain Score      Pain Loc      Pain Edu?      Excl. in [place]?     Vital signs reviewed, nursing assessments reviewed.   Constitutional:   Alert and oriented. Ill-appearing. Eyes:   No scleral icterus. No conjunctival pallor. PERRL. EOMI.  No nystagmus. ENT   Head:   Normocephalic and atraumatic.   Nose:   No congestion/rhinnorhea. No septal hematoma   Mouth/Throat:   MMM, no pharyngeal erythema. No peritonsillar mass.    Neck:   No stridor. No SubQ emphysema. No meningismus. Hematological/Lymphatic/Immunilogical:   No  cervical lymphadenopathy. Cardiovascular:   RRR. Symmetric bilateral radial and DP pulses.  No murmurs.  Respiratory:   Normal respiratory effort without tachypnea nor retractions. Breath sounds are clear and equal bilaterally. No wheezes/rales/rhonchi. Gastrointestinal:   Soft and nontender. Non distended. There is no CVA tenderness.  No rebound, rigidity, or guarding. Genitourinary:   deferred Musculoskeletal:   Nontender with normal range of motion in all extremities. No joint effusions.  No lower extremity tenderness.  No edema. Neurologic:   Normal speech and language.  CN 2-10 normal. Motor grossly intact. No gross focal neurologic deficits are appreciated.  Skin:    Skin is warm, dry and intact. No rash noted.  No petechiae, purpura, or bullae.  ____________________________________________    LABS (pertinent positives/negatives) (all labs ordered are listed, but only abnormal results are displayed) Labs Reviewed - No data to display ____________________________________________   EKG  EKG performed by EMS, interpreted by me ST elevation in II III aVF as well as in I and aVL. No reciprocal depressions. Normal sinus rhythm, rate of approximately 70. Normal axis intervals and QRS.  ____________________________________________    RADIOLOGY    ____________________________________________   PROCEDURES Procedures CRITICAL CARE Performed by: Joni Fears, Norabelle Kondo   Total critical care time: 15 minutes  Critical care time was exclusive of separately billable procedures and treating other patients.  Critical care was necessary to treat or prevent imminent or life-threatening deterioration.  Critical care was time spent personally by me on the following activities: development of treatment plan with patient and/or surrogate as well as nursing, discussions with consultants, evaluation of patient's response to treatment, examination of patient, obtaining history from patient or  surrogate, ordering and performing treatments and interventions, ordering and review of laboratory studies, ordering and review of radiographic studies, pulse oximetry and re-evaluation of patient's condition.  ____________________________________________   INITIAL IMPRESSION / ASSESSMENT AND PLAN / ED COURSE  Pertinent labs & imaging results that were available during my care of the patient were reviewed by me and considered in my medical decision making (see chart for details).  Patient presents with chest pain, EKG diagnostic of STEMI. Dr. Fletcher Anon at bedside very soon after patient's arrival, recommends proceeding to cardiac catheterization. Further management at this point by Dr. Fletcher Anon.   ----------------------------------------- 10:10 AM  on 12/02/2016 -----------------------------------------  Patient transport to Cath Lab with Dr. Fletcher Anon in progress at this time.    Clinical Course    ____________________________________________   FINAL CLINICAL IMPRESSION(S) / ED DIAGNOSES  Final diagnoses:  STEMI involving right coronary artery [facility])      New Prescriptions   No medications on file     Portions of this note were generated with dragon dictation software. Dictation errors may occur despite best attempts at proofreading.    Carrie Mew, MD 12/02/16 1011

## 2016-12-02 NOTE — Progress Notes (Signed)
Dr. Benjie Karvonen notified of Troponin level of 11.63

## 2016-12-03 DIAGNOSIS — L8990 Pressure ulcer of unspecified site, unspecified stage: Secondary | ICD-10-CM | POA: Insufficient documentation

## 2016-12-03 LAB — ECHOCARDIOGRAM COMPLETE
AO mean calculated velocity dopler: 229 cm/s
AOPV: 0.27 m/s
AOVTI: 91.8 cm
AV Area VTI index: 0.32 cm2/m2
AV Area mean vel: 0.66 cm2
AV vel: 0.6
AVA: 0.6 cm2
AVAREAMEANVIN: 0.35 cm2/m2
AVAREAVTI: 0.62 cm2
AVCELMEANRAT: 0.29
AVG: 25 mmHg
AVPG: 42 mmHg
AVPKVEL: 325 cm/s
CHL CUP AV PEAK INDEX: 0.33
CHL CUP AV VALUE AREA INDEX: 0.32
CHL CUP DOP CALC LVOT VTI: 24.2 cm
CHL CUP MV DEC (S): 134
EERAT: 31.02
EWDT: 134 ms
FS: 14 % — AB (ref 28–44)
Height: 65 in
IV/PV OW: 1.03
LA vol A4C: 84.2 ml
LA vol index: 39.3 mL/m2
LADIAMINDEX: 2.61 cm/m2
LASIZE: 49 mm
LAVOL: 73.9 mL
LEFT ATRIUM END SYS DIAM: 49 mm
LV PW d: 10.5 mm — AB (ref 0.6–1.1)
LV TDI E'LATERAL: 5.61
LV TDI E'MEDIAL: 3.86
LV e' LATERAL: 5.61 cm/s
LVEEAVG: 31.02
LVEEMED: 31.02
LVOT SV: 55 mL
LVOT area: 2.27 cm2
LVOT diameter: 17 mm
LVOT peak VTI: 0.26 cm
LVOTPV: 88.7 cm/s
Lateral S' vel: 9.99 cm/s
MV pk A vel: 140 m/s
MV pk E vel: 174 m/s
MVPG: 12 mmHg
TAPSE: 19.5 mm
VTI: 234 cm
Weight: 2663.16 oz

## 2016-12-03 LAB — CBC
HCT: 34.8 % — ABNORMAL LOW (ref 35.0–47.0)
HEMOGLOBIN: 11.5 g/dL — AB (ref 12.0–16.0)
MCH: 30.4 pg (ref 26.0–34.0)
MCHC: 33.1 g/dL (ref 32.0–36.0)
MCV: 91.7 fL (ref 80.0–100.0)
Platelets: 100 10*3/uL — ABNORMAL LOW (ref 150–440)
RBC: 3.8 MIL/uL (ref 3.80–5.20)
RDW: 18.4 % — AB (ref 11.5–14.5)
WBC: 4.2 10*3/uL (ref 3.6–11.0)

## 2016-12-03 LAB — GLUCOSE, CAPILLARY
GLUCOSE-CAPILLARY: 121 mg/dL — AB (ref 65–99)
GLUCOSE-CAPILLARY: 136 mg/dL — AB (ref 65–99)
GLUCOSE-CAPILLARY: 183 mg/dL — AB (ref 65–99)
Glucose-Capillary: 130 mg/dL — ABNORMAL HIGH (ref 65–99)

## 2016-12-03 LAB — BASIC METABOLIC PANEL
Anion gap: 11 (ref 5–15)
BUN: 26 mg/dL — AB (ref 6–20)
CALCIUM: 8.6 mg/dL — AB (ref 8.9–10.3)
CO2: 27 mmol/L (ref 22–32)
CREATININE: 2.36 mg/dL — AB (ref 0.44–1.00)
Chloride: 99 mmol/L — ABNORMAL LOW (ref 101–111)
GFR calc Af Amer: 21 mL/min — ABNORMAL LOW (ref >60)
GFR calc non Af Amer: 18 mL/min — ABNORMAL LOW (ref >60)
GLUCOSE: 176 mg/dL — AB (ref 65–99)
Potassium: 4.1 mmol/L (ref 3.5–5.1)
Sodium: 137 mmol/L (ref 135–145)

## 2016-12-03 LAB — LIPID PANEL
CHOL/HDL RATIO: 2.1 ratio
Cholesterol: 108 mg/dL (ref 0–200)
HDL: 52 mg/dL (ref >40)
LDL Cholesterol: 40 mg/dL (ref 0–99)
Triglycerides: 82 mg/dL (ref <150)
VLDL: 16 mg/dL (ref 0–40)

## 2016-12-03 LAB — MAGNESIUM
MAGNESIUM: 1.4 mg/dL — AB (ref 1.7–2.4)
Magnesium: 2.9 mg/dL — ABNORMAL HIGH (ref 1.7–2.4)

## 2016-12-03 LAB — HEMOGLOBIN A1C
Hgb A1c MFr Bld: 6.3 % — ABNORMAL HIGH (ref 4.8–5.6)
Mean Plasma Glucose: 134 mg/dL

## 2016-12-03 LAB — POTASSIUM
POTASSIUM: 4.1 mmol/L (ref 3.5–5.1)
POTASSIUM: 4 mmol/L (ref 3.5–5.1)

## 2016-12-03 MED ORDER — AMIODARONE LOAD VIA INFUSION
150.0000 mg | Freq: Once | INTRAVENOUS | Status: AC
  Administered 2016-12-03: 150 mg via INTRAVENOUS
  Filled 2016-12-03: qty 83.34

## 2016-12-03 MED ORDER — AMIODARONE LOAD VIA INFUSION: 150.0000 mg | Freq: Once | INTRAVENOUS | Status: DC

## 2016-12-03 MED ORDER — AMIODARONE HCL IN DEXTROSE 360-4.14 MG/200ML-% IV SOLN: 30.0000 mg/h | INTRAVENOUS | Status: DC

## 2016-12-03 MED ORDER — AMIODARONE HCL IN DEXTROSE 360-4.14 MG/200ML-% IV SOLN
60.0000 mg/h | INTRAVENOUS | Status: DC
  Administered 2016-12-03: 60 mg/h via INTRAVENOUS
  Filled 2016-12-03: qty 200

## 2016-12-03 MED ORDER — MAGNESIUM SULFATE 4 GM/100ML IV SOLN
4.0000 g | Freq: Once | INTRAVENOUS | Status: AC
  Administered 2016-12-03: 4 g via INTRAVENOUS
  Filled 2016-12-03: qty 100

## 2016-12-03 MED ORDER — AMIODARONE IV BOLUS ONLY 150 MG/100ML
150.0000 mg | Freq: Once | INTRAVENOUS | Status: DC
  Filled 2016-12-03: qty 100

## 2016-12-03 NOTE — Progress Notes (Signed)
MEDICATION RELATED CONSULT NOTE - Electrolyte Management    Pharmacy Consult for electrolyte management   Pharmacy consulted for electrolyte management for 81 yo female admitted with STEMI and had episode of Vfib/[place] on 12/12 ~1245. Goal electrolytes are potassium > 4 and magnesium > 2.    Plan:  Will order magnesium 4g IV x 1. Will recheck magnesium and potassium at 1800.     Allergies  Allergen Reactions  . Aspirin Other (See Comments)    bleeding  . Atenolol Other (See Comments)    fatigue  . Clopidogrel Bisulfate Other (See Comments)    bleeding  . Darvon [Propoxyphene] Hives and Other (See Comments)  . Hydrochlorothiazide Other (See Comments)    Fatigue and cramps  . Iodinated Diagnostic Agents Other (See Comments)    Questionable GI bleed  . Nylon Hives  . Other Other (See Comments)    Nylon sutures  . Pravastatin Other (See Comments)    Severe cramping    Patient Measurements: Height: '5\' 5"'$  (165.1 cm) Weight: 166 lb 7.2 oz (75.5 kg) IBW/kg (Calculated) : 57   Vital Signs: Temp: 99 F (37.2 C) (12/12 1300) Temp Source: Rectal (12/12 0800) BP: 136/60 (12/12 1300) Pulse Rate: 71 (12/12 1300) Intake/Output from previous day: 12/11 0701 - 12/12 0700 In: 1440 [I.V.:1440] Out: 1050 [Urine:1050] Intake/Output from this shift: Total I/O In: 243 [P.O.:240; I.V.:3] Out: -   Labs:  Recent Labs  12/02/16 1153 12/03/16 0359 12/03/16 1305  WBC 7.9 4.2  --   HGB 13.0 11.5*  --   HCT 39.6 34.8*  --   PLT 125* 100*  --   CREATININE 2.29* 2.36*  --   MG  --   --  1.4*   Estimated Creatinine Clearance: 19.7 mL/min (by C-G formula based on SCr of 2.36 mg/dL (H)).  Pharmacy will continue to monitor and adjust per consult.    Jodye Scali L 12/03/2016,1:54 PM

## 2016-12-03 NOTE — Progress Notes (Signed)
MEDICATION RELATED CONSULT NOTE - Electrolyte Management    Pharmacy Consult for electrolyte management   Pharmacy consulted for electrolyte management for 81 yo female admitted with STEMI and had episode of Vfib/Torsade de Pointe on 12/12 ~1245. Goal electrolytes are potassium > 4 and magnesium > 2.    Plan:  Will order magnesium 4g IV x 1. Will recheck magnesium and potassium at 1800.   12/12  @ 20:00 :  K = 4.0                              Mag = 2.9.   No electrolyte supplementation needed at this time.  Will recheck electrolytes on 12/13 with AM labs.     Allergies  Allergen Reactions  . Aspirin Other (See Comments)    bleeding  . Atenolol Other (See Comments)    fatigue  . Clopidogrel Bisulfate Other (See Comments)    bleeding  . Darvon [Propoxyphene] Hives and Other (See Comments)  . Hydrochlorothiazide Other (See Comments)    Fatigue and cramps  . Iodinated Diagnostic Agents Other (See Comments)    Questionable GI bleed  . Nylon Hives  . Other Other (See Comments)    Nylon sutures  . Pravastatin Other (See Comments)    Severe cramping    Patient Measurements: Height: '5\' 5"'$  (165.1 cm) Weight: 166 lb 7.2 oz (75.5 kg) IBW/kg (Calculated) : 57   Vital Signs: Temp: 98.4 F (36.9 C) (12/12 1900) Temp Source: Rectal (12/12 1400) BP: 106/48 (12/12 1900) Pulse Rate: 49 (12/12 1900) Intake/Output from previous day: 12/11 0701 - 12/12 0700 In: 1440 [I.V.:1440] Out: 1050 [Urine:1050] Intake/Output from this shift: No intake/output data recorded.  Labs:  Recent Labs  12/02/16 1153 12/03/16 0359 12/03/16 1305 12/03/16 1950  WBC 7.9 4.2  --   --   HGB 13.0 11.5*  --   --   HCT 39.6 34.8*  --   --   PLT 125* 100*  --   --   CREATININE 2.29* 2.36*  --   --   MG  --   --  1.4* 2.9*   Estimated Creatinine Clearance: 19.7 mL/min (by C-G formula based on SCr of 2.36 mg/dL (H)).  Pharmacy will continue to monitor and adjust per consult.    Baylyn Sickles  D 12/03/2016,9:23 PM

## 2016-12-03 NOTE — Progress Notes (Signed)
Pt was assisted to sit on the side of bed to eat lunch and went unresponsive sitting the bedside, eyes rolled back in head. V. Fib/T[place] de [place] on cardiac monitor, HR 150s-Code Blue called. Pt converted out of dysrhythmia on her own within the minute. Dr Manuella Ghazi came to bedside. Dr Ubaldo Glassing made aware of pt dysrhythmia.

## 2016-12-03 NOTE — Progress Notes (Signed)
[facility] NAME: Morgan [name]    MR#:  [id number]  DATE OF BIRTH:  10-09-1935  SUBJECTIVE:  CHIEF COMPLAINT:  No chief complaint on file. tired. hungry REVIEW OF SYSTEMS:  Review of Systems  Constitutional: Positive for malaise/fatigue. Negative for chills, fever and weight loss.  HENT: Negative for nosebleeds and sore throat.   Eyes: Negative for blurred vision.  Respiratory: Negative for cough, shortness of breath and wheezing.   Cardiovascular: Positive for palpitations. Negative for chest pain, orthopnea, leg swelling and PND.  Gastrointestinal: Negative for abdominal pain, constipation, diarrhea, heartburn, nausea and vomiting.  Genitourinary: Negative for dysuria and urgency.  Musculoskeletal: Negative for back pain.  Skin: Negative for rash.  Neurological: Positive for weakness. Negative for dizziness, speech change, focal weakness and headaches.  Endo/Heme/Allergies: Does not bruise/bleed easily.  Psychiatric/Behavioral: Negative for depression.    DRUG ALLERGIES:   Allergies  Allergen Reactions  . Aspirin Other (See Comments)    bleeding  . Atenolol Other (See Comments)    fatigue  . Clopidogrel Bisulfate Other (See Comments)    bleeding  . Darvon [Propoxyphene] Hives and Other (See Comments)  . Hydrochlorothiazide Other (See Comments)    Fatigue and cramps  . Iodinated Diagnostic Agents Other (See Comments)    Questionable GI bleed  . Nylon Hives  . Other Other (See Comments)    Nylon sutures  . Pravastatin Other (See Comments)    Severe cramping   VITALS:  Blood pressure 136/60, pulse 71, temperature 99 F (37.2 C), resp. rate (!) 22, height '5\' 5"'$  (1.651 m), weight 75.5 kg (166 lb 7.2 oz), SpO2 98 %. PHYSICAL EXAMINATION:  Physical Exam  Constitutional: She is oriented to person, place, and time and well-developed, well-nourished, and in no distress.  HENT:  Head: Normocephalic and atraumatic.    Eyes: Conjunctivae and EOM are normal. Pupils are equal, round, and reactive to light.  Neck: Normal range of motion. Neck supple. No tracheal deviation present. No thyromegaly present.  Cardiovascular: Normal rate, regular rhythm and normal heart sounds.   Pulmonary/Chest: Effort normal and breath sounds normal. No respiratory distress. She has no wheezes. She exhibits no tenderness.  Abdominal: Soft. Bowel sounds are normal. She exhibits no distension. There is no tenderness.  Musculoskeletal: Normal range of motion.  Neurological: She is alert and oriented to person, place, and time. No cranial nerve deficit.  Skin: Skin is warm and dry. No rash noted.  Psychiatric: Mood and affect normal.   LABORATORY PANEL:   CBC  Recent Labs Lab 12/03/16 0359  WBC 4.2  HGB 11.5*  HCT 34.8*  PLT 100*   ------------------------------------------------------------------------------------------------------------------ Chemistries   Recent Labs Lab 12/03/16 0359 12/03/16 1305  NA 137  --   K 4.1 4.1  CL 99*  --   CO2 27  --   GLUCOSE 176*  --   BUN 26*  --   CREATININE 2.36*  --   CALCIUM 8.6*  --   MG  --  1.4*   RADIOLOGY:  No results found. ASSESSMENT AND PLAN:  81 year old female with a history of multiple AVMs with GI bleed not on aspirin and Plavix due to complications of severe bleeding who presents with ST elevation MI status post cardiac catheterization which shows thrombosed RCA stent.   1. ST elevation MI: status post cardiac catheterization with RCA stent thrombosis. - continue aspirin and Plavix. The patient has history of recurrent GI bleed. She  should be treated with aspirin and Plavix for at least one month and if tolerated she should stay on at least one agent after that. She is at high risk for acute stent thrombosis. Unfortunately, she is not a good candidate for a 2 B 3A inhibitor due to prohibitive bleeding risk. Continue statin and metoprolol. Due to low  blood pressure will hold on ACE inhibitor.  2. Torsades de Pointes - check Mg, and replete if Low  History of GI bleeds with AV malformations: Monitor CBC closely on aspirin and Plavix.  3. 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 Hyperlipidemia: Continue statin      All the records are reviewed and case discussed with Care Management/Social Worker. Management plans discussed with the patient, family and they are in agreement.  CODE STATUS: DO NOT RESUSCITATE, per palliative care consultation  TOTAL TIME (Critical care) TAKING CARE OF THIS PATIENT: 35 minutes.   More than 50% of the time was spent in counseling/coordination of care: YES  POSSIBLE D/C IN 2-3 DAYS, DEPENDING ON CLINICAL CONDITION.    Max Sane M.D on 12/03/2016 at 5:50 PM  Between 7am to 6pm - Pager - [phone]  After 6pm go to [email or web address] - Proofreader  [facility]  Office  [phone]  CC: Primary care physician; Kirk Ruths., MD  Note: This dictation was prepared with Dragon dictation along with smaller phrase technology. Any transcriptional errors that result from this process are unintentional.

## 2016-12-03 NOTE — Progress Notes (Signed)
[place]  SUBJECTIVE: no chest pian but had syncope with vf which converted on its own.    Vitals:   12/03/16 1000 12/03/16 1100 12/03/16 1200 12/03/16 1300  BP: (!) 129/59 (!) 111/48 (!) 114/48 136/60  Pulse: 75 65 65 71  Resp: (!) '23 20 17 '$ (!) 22  Temp: 99.5 F (37.5 C) 99.1 F (37.3 C) 99 F (37.2 C) 99 F (37.2 C)  TempSrc:      SpO2: 91% (!) 89% (!) 88% 98%  Weight:      Height:        Intake/Output Summary (Last 24 hours) at 12/03/16 1419 Last data filed at 12/03/16 1000  Gross per 24 hour  Intake             1518 ml  Output              850 ml  Net              668 ml    LABS: Basic Metabolic Panel:  Recent Labs  12/02/16 1153 12/03/16 0359 12/03/16 1305  NA  --  137  --   K  --  4.1 4.1  CL  --  99*  --   CO2  --  27  --   GLUCOSE  --  176*  --   BUN  --  26*  --   CREATININE 2.29* 2.36*  --   CALCIUM  --  8.6*  --   MG  --   --  1.4*   Liver Function Tests: No results for input(s): AST, ALT, ALKPHOS, BILITOT, PROT, ALBUMIN in the last 72 hours. No results for input(s): LIPASE, AMYLASE in the last 72 hours. CBC:  Recent Labs  12/02/16 1153 12/03/16 0359  WBC 7.9 4.2  HGB 13.0 11.5*  HCT 39.6 34.8*  MCV 90.3 91.7  PLT 125* 100*   Cardiac Enzymes:  Recent Labs  12/02/16 1153  TROPONINI 11.63*   BNP: Invalid input(s): POCBNP D-Dimer: No results for input(s): DDIMER in the last 72 hours. Hemoglobin A1C:  Recent Labs  12/02/16 1153  HGBA1C 6.3*   Fasting Lipid Panel:  Recent Labs  12/03/16 0359  CHOL 108  HDL 52  LDLCALC 40  TRIG 82  CHOLHDL 2.1   Thyroid Function Tests:  Recent Labs  12/02/16 1153  TSH 3.549   Anemia Panel: No results for input(s): VITAMINB12, FOLATE, FERRITIN, TIBC, IRON, RETICCTPCT in the last 72 hours.   Physical Exam: Blood pressure 136/60, pulse 71, temperature 99 F (37.2 C), resp. rate (!) 22, height '5\' 5"'$  (1.651 m), weight 166 lb 7.2 oz (75.5  kg), SpO2 98 %.   Wt Readings from Last 1 Encounters:  12/02/16 166 lb 7.2 oz (75.5 kg)     General appearance: alert and cooperative Resp: clear to auscultation bilaterally Cardio: regular rate and rhythm GI: soft, non-tender; bowel sounds normal; no masses,  no organomegaly Neurologic: Grossly normal  TELEMETRY: Reviewed telemetry pt in sr with intermitant wide complex tachycardia:  ASSESSMENT AND PLAN:  Active Problems:   STEMI involving right coronary artery (HCC)   ST elevation myocardial infarction (STEMI) of inferior wall (HCC)   Pressure injury of skin  VT-post mi vr/vt/torsades.-spontaneiously converted. Will check electrolytes and load with amiodarone iv.   Teodoro Spray, MD, [facility] 12/03/2016 2:19 PM

## 2016-12-04 ENCOUNTER — Encounter: Payer: Self-pay | Admitting: *Deleted

## 2016-12-04 DIAGNOSIS — I2119 ST elevation (STEMI) myocardial infarction involving other coronary artery of inferior wall: Secondary | ICD-10-CM

## 2016-12-04 DIAGNOSIS — Z7189 Other specified counseling: Secondary | ICD-10-CM

## 2016-12-04 DIAGNOSIS — Z515 Encounter for palliative care: Secondary | ICD-10-CM

## 2016-12-04 LAB — BASIC METABOLIC PANEL
Anion gap: 9 (ref 5–15)
BUN: 33 mg/dL — AB (ref 6–20)
CALCIUM: 8 mg/dL — AB (ref 8.9–10.3)
CHLORIDE: 99 mmol/L — AB (ref 101–111)
CO2: 27 mmol/L (ref 22–32)
CREATININE: 2.81 mg/dL — AB (ref 0.44–1.00)
GFR calc non Af Amer: 15 mL/min — ABNORMAL LOW (ref >60)
GFR, EST AFRICAN AMERICAN: 17 mL/min — AB (ref >60)
Glucose, Bld: 246 mg/dL — ABNORMAL HIGH (ref 65–99)
Potassium: 3.6 mmol/L (ref 3.5–5.1)
SODIUM: 135 mmol/L (ref 135–145)

## 2016-12-04 LAB — GLUCOSE, CAPILLARY
GLUCOSE-CAPILLARY: 104 mg/dL — AB (ref 65–99)
GLUCOSE-CAPILLARY: 204 mg/dL — AB (ref 65–99)
Glucose-Capillary: 179 mg/dL — ABNORMAL HIGH (ref 65–99)
Glucose-Capillary: 121 mg/dL — ABNORMAL HIGH (ref 65–99)

## 2016-12-04 MED ORDER — AMIODARONE HCL 200 MG PO TABS
200.0000 mg | ORAL_TABLET | Freq: Two times a day (BID) | ORAL | Status: DC
  Administered 2016-12-04 – 2016-12-06 (×5): 200 mg via ORAL
  Filled 2016-12-04 (×5): qty 1

## 2016-12-04 MED ORDER — AMIODARONE HCL IN DEXTROSE 360-4.14 MG/200ML-% IV SOLN
30.0000 mg/h | INTRAVENOUS | Status: DC
  Administered 2016-12-04: 30 mg/h via INTRAVENOUS
  Filled 2016-12-04 (×2): qty 200

## 2016-12-04 NOTE — Consult Note (Signed)
Date: 12/04/2016                  Patient Name:  Morgan [name]  MRN: [id number]  DOB: 16-Jan-1935  Age / Sex: 81 y.o., female         PCP: Kirk Ruths., MD                 Service Requesting Consult: Internal medicine                 Reason for Consult: ARF            History of Present Illness: Patient is a 81 y.o. female with medical problems of Coronary disease, cardiomyopathy, congestive heart failure LVEF 30-35%, chronic edema, diabetes, significant history of GI bleed due to AV malformations requiring multiple units of blood transfusion since 2013, valvular heart disease including aortic stenosis, mitral stenosis, who was admitted to [facility] on 12/02/2016 for evaluation of sudden onset of anterior chest pain. She was diagnosed with acute inferior ST elevation MI which occurred due to thrombotic occlusion of previous stents in the proximal to mid segment of the RCA. She underwent successful balloon angioplasty of the right coronary on 12/11 Nephrology consult is requested for evaluation of decreased urine output post cath Postoperative course is complicated by ventricular arrhythmias  Patient appears to have underlying chronic kidney disease. On October 5, her creatinine was 1.64/GFR of 29 Presenting creatinine was 2.29/GFR 19 Today's creatinine has increased to 2.81/GFR 15   Medications: Outpatient medications: Prescriptions Prior to Admission  Medication Sig Dispense Refill Last Dose  . acetaminophen (TYLENOL) 500 MG tablet Take 500 mg by mouth at bedtime.   unknown at unknown  . diclofenac sodium (VOLTAREN) 1 % GEL Apply 2 g topically 4 (four) times daily as needed. For pain   PRN at PRN  . lisinopril (PRINIVIL,ZESTRIL) 40 MG tablet Take 40 mg by mouth daily.   unknown at unknown  . metolazone (ZAROXOLYN) 10 MG tablet Take 10 mg by mouth daily as needed. For fluid/swelling   PRN at PRN  . metoprolol succinate (TOPROL-XL) 50 MG 24 hr tablet Take 50 mg by mouth daily.     unknown at unknown  . pantoprazole (PROTONIX) 40 MG tablet Take 40 mg by mouth 2 (two) times daily.    unknown at unknown  . potassium chloride (K-DUR) 10 MEQ tablet Take 10 mEq by mouth 2 (two) times daily.   unknown at unknown  . simvastatin (ZOCOR) 20 MG tablet Take 20 mg by mouth at bedtime.    unknown at unknown  . spironolactone (ALDACTONE) 25 MG tablet Take 25 mg by mouth daily.   unknown at unknown  . torsemide (DEMADEX) 100 MG tablet Take 100 mg by mouth daily.   unknown at unknown  . amLODipine (NORVASC) 2.5 MG tablet Take 1 tablet (2.5 mg total) by mouth daily. (Patient not taking: Reported on 12/02/2016) 30 tablet 0 Not Taking at Unknown time    Current medications: Current Facility-Administered Medications  Medication Dose Route Frequency Provider Last Rate Last Dose  . 0.9 %  sodium chloride infusion   Intravenous Continuous Bettey Costa, MD 75 mL/hr at 12/04/16 1600    . 0.9 %  sodium chloride infusion  250 mL Intravenous PRN Wellington Hampshire, MD      . acetaminophen (TYLENOL) tablet 650 mg  650 mg Oral Q6H PRN Bettey Costa, MD       Or  . acetaminophen (TYLENOL) suppository 650 mg  650 mg Rectal Q6H PRN Bettey Costa, MD      . amiodarone (PACERONE) tablet 200 mg  200 mg Oral BID Teodoro Spray, MD   200 mg at 12/04/16 1406  . aspirin chewable tablet 81 mg  81 mg Oral Daily Wellington Hampshire, MD   81 mg at 12/04/16 0956  . clopidogrel (PLAVIX) tablet 75 mg  75 mg Oral Q breakfast Wellington Hampshire, MD   75 mg at 12/04/16 0825  . heparin injection 5,000 Units  5,000 Units Subcutaneous Q8H Bettey Costa, MD   5,000 Units at 12/04/16 1406  . HYDROcodone-acetaminophen (NORCO/VICODIN) 5-325 MG per tablet 1-2 tablet  1-2 tablet Oral Q4H PRN Bettey Costa, MD      . insulin aspart (novoLOG) injection 0-5 Units  0-5 Units Subcutaneous QHS Bettey Costa, MD   3 Units at 12/02/16 2111  . insulin aspart (novoLOG) injection 0-9 Units  0-9 Units Subcutaneous TID WC Bettey Costa, MD   2 Units at 12/04/16 1729   . MEDLINE mouth rinse  15 mL Mouth Rinse BID Bettey Costa, MD   15 mL at 12/03/16 2141  . metoprolol succinate (TOPROL-XL) 24 hr tablet 25 mg  25 mg Oral Daily Wellington Hampshire, MD   25 mg at 12/04/16 0956  . ondansetron (ZOFRAN) tablet 4 mg  4 mg Oral Q6H PRN Bettey Costa, MD       Or  . ondansetron (ZOFRAN) injection 4 mg  4 mg Intravenous Q6H PRN Bettey Costa, MD   4 mg at 12/03/16 1251  . pantoprazole (PROTONIX) EC tablet 40 mg  40 mg Oral BID Bettey Costa, MD   40 mg at 12/04/16 0956  . simvastatin (ZOCOR) tablet 20 mg  20 mg Oral QHS Wellington Hampshire, MD   20 mg at 12/03/16 2138  . sodium chloride flush (NS) 0.9 % injection 3 mL  3 mL Intravenous Q12H Bettey Costa, MD   3 mL at 12/04/16 0958  . sodium chloride flush (NS) 0.9 % injection 3 mL  3 mL Intravenous Q12H Wellington Hampshire, MD   3 mL at 12/04/16 0958  . sodium chloride flush (NS) 0.9 % injection 3 mL  3 mL Intravenous PRN Wellington Hampshire, MD       Facility-Administered Medications Ordered in Other Encounters  Medication Dose Route Frequency Provider Last Rate Last Dose  . acetaminophen (TYLENOL) tablet 650 mg  650 mg Oral Once Cammie Sickle, MD      . diphenhydrAMINE (BENADRYL) capsule 25 mg  25 mg Oral Once Cammie Sickle, MD      . epoetin alfa (EPOGEN,PROCRIT) injection 20,000 Units  20,000 Units Subcutaneous Once Cammie Sickle, MD      . furosemide (LASIX) injection 20 mg  20 mg Intravenous Once Cammie Sickle, MD      . heparin lock flush 100 unit/mL  500 Units Intracatheter Daily PRN Cammie Sickle, MD      . heparin lock flush 100 unit/mL  250 Units Intracatheter PRN Cammie Sickle, MD      . sodium chloride flush (NS) 0.9 % injection 10 mL  10 mL Intracatheter PRN Cammie Sickle, MD      . sodium chloride flush (NS) 0.9 % injection 3 mL  3 mL Intracatheter PRN Cammie Sickle, MD          Allergies: Allergies  Allergen Reactions  . Aspirin Other (See Comments)    bleeding  .  Atenolol Other (See Comments)    fatigue  . Clopidogrel Bisulfate Other (See Comments)    bleeding  . Darvon [Propoxyphene] Hives and Other (See Comments)  . Hydrochlorothiazide Other (See Comments)    Fatigue and cramps  . Iodinated Diagnostic Agents Other (See Comments)    Questionable GI bleed  . Nylon Hives  . Other Other (See Comments)    Nylon sutures  . Pravastatin Other (See Comments)    Severe cramping      Past Medical History: Past Medical History:  Diagnosis Date  . Abnormal LFTs (liver function tests)   . Adenomatous polyps 03/05/2012  . Angiodysplasia   . Aortic stenosis   . Arthritis   . AV malformation of GI tract   . Bilateral cellulitis of lower leg   . CAD (coronary artery disease)   . Cardiomyopathy ([place])   . Carotid bruit   . Cervical cancer ([place])    cervical cancer history  . CHF (congestive heart failure) ([place])   . Chronic edema   . Clostridium difficile infection March 02, 2015  . Complication of anesthesia     I have trouble waking up "  . COPD (chronic obstructive pulmonary disease) [place]   . Diabetes mellitus with stage 3 chronic kidney disease ([place])   . Fatty liver   . Fibrocystic breast changes   . GERD (gastroesophageal reflux disease)   . GI bleed   . Gout   . History of blood transfusion   . History of nicotine use   . Hyperlipidemia   . Hypertension   . Iron deficiency anemia 2008  . Irritable bowel syndrome   . Macrocytosis   . Osteoarthritis   . Peptic ulcer disease   . PVD (peripheral vascular disease) ([place])   . Tobacco abuse   . Trigeminal neuralgia   . Vascular malformation 06/14/1997     Past Surgical History: Past Surgical History:  Procedure Laterality Date  . ABDOMINAL HYSTERECTOMY    . angiogram cardiac stenting    . BONE MARROW BIOPSY  1999  . CARDIAC CATHETERIZATION N/A 12/02/2016   Procedure: Left Heart Cath and Coronary Angiography;  Surgeon: Wellington Hampshire, MD;  Location: [place] [facility];   Service: Cardiovascular;  Laterality: N/A;  . CARDIAC CATHETERIZATION N/A 12/02/2016   Procedure: Coronary Balloon Angioplasty;  Surgeon: Wellington Hampshire, MD;  Location: [place] [facility];  Service: Cardiovascular;  Laterality: N/A;  . CHOLECYSTECTOMY    . COLONOSCOPY  12/2014, 2014, 2013, 2008, 2004   03/05/2012-Adenomatous Polyps;09/23/2003-Polyp remains intact;   . COLONOSCOPY N/A 07/12/2016   Procedure: COLONOSCOPY;  Surgeon: Ronald Lobo, MD;  Location: [facility] ENDOSCOPY;  Service: Endoscopy;  Laterality: N/A;  . COLONOSCOPY N/A 08/23/2016   Procedure: COLONOSCOPY;  Surgeon: Lucilla Lame, MD;  Location: [facility] ENDOSCOPY;  Service: Endoscopy;  Laterality: N/A;  . Endoscopic Carpal Tunnel Release    . ESOPHAGOGASTRODUODENOSCOPY  12/2014, 2014, 2013, 2009, 2004  . ESOPHAGOGASTRODUODENOSCOPY (EGD) WITH PROPOFOL N/A 07/12/2016   Procedure: ESOPHAGOGASTRODUODENOSCOPY (EGD) WITH PROPOFOL;  Surgeon: Ronald Lobo, MD;  Location: [place];  Service: Endoscopy;  Laterality: N/A;  . ESOPHAGOGASTRODUODENOSCOPY (EGD) WITH PROPOFOL N/A 08/23/2016   Procedure: ESOPHAGOGASTRODUODENOSCOPY (EGD) WITH PROPOFOL;  Surgeon: Lucilla Lame, MD;  Location: [facility] ENDOSCOPY;  Service: Endoscopy;  Laterality: N/A;  . HOT HEMOSTASIS N/A 07/12/2016   Procedure: HOT HEMOSTASIS (ARGON PLASMA COAGULATION/BICAP);  Surgeon: Ronald Lobo, MD;  Location: [facility] ENDOSCOPY;  Service: Endoscopy;  Laterality: N/A;  . NASAL SINUS SURGERY    .  TEE WITHOUT CARDIOVERSION N/A 12/19/2015   Procedure: TRANSESOPHAGEAL ECHOCARDIOGRAM (TEE);  Surgeon: Yolonda Kida, MD;  Location: [facility] ORS;  Service: Cardiovascular;  Laterality: N/A;  . THORACENTESIS    . TONSILLECTOMY       Family History: Family History  Problem Relation Age of Onset  . Parkinsonism Mother   . Heart attack Mother   . Lung cancer Father   . Diabetes Mellitus II Father   . Asthma Sister   . Asthma Brother   . Heart disease Son      Social History: Social History    Social History  . Marital status: Married    Spouse name: N/A  . Number of children: N/A  . Years of education: N/A   Occupational History  . Not on file.   Social History Main Topics  . Smoking status: Former Smoker    Packs/day: 0.50    Years: 56.00    Types: Cigarettes    Quit date: 05/23/2013  . Smokeless tobacco: Never Used  . Alcohol use No  . Drug use: No  . Sexual activity: No   Other Topics Concern  . Not on file   Social History Narrative  . No narrative on file     Review of Systems: Gen: No fevers or chills HEENT: No vision or hearing problems CV: Sudden chest pain, MI, angioplasty Resp: No cough, sputum GI: GI bleed from AVMs GU : No problems with voiding, no blood in the urine MS: No acute complaints at present Derm:  No complaints Psych: No complaints Heme: No complaints Neuro: No complaints Endocrine No complaints  Vital Signs: Blood pressure (!) 130/51, pulse (!) 55, temperature 98.1 F (36.7 C), temperature source Oral, resp. rate 20, height 5' 5" (1.651 m), weight 75.5 kg (166 lb 7.2 oz), SpO2 100 %.   Intake/Output Summary (Last 24 hours) at 12/04/16 1947 Last data filed at 12/04/16 1600  Gross per 24 hour  Intake          2943.27 ml  Output              548 ml  Net          2395.27 ml    Weight trends: Filed Weights   12/02/16 1139 12/02/16 1400  Weight: 75.5 kg (166 lb 7.2 oz) 75.5 kg (166 lb 7.2 oz)    Physical Exam: General:  Elderly, frail woman, lying in the bed   HEENT Anicteric, moist oral mucous membranes   Neck:  Supple   Lungs: Normal breathing effort, coarse crackles at bases   Heart::  Regular with ectopic beats, prominent systolic murmur   Abdomen: Soft, nontender, nondistended   Extremities:  Bilateral Ace wraps for edema   Neurologic: Alert, oriented   Skin: No acute rashes              Lab results: Basic Metabolic Panel:  Recent Labs Lab 12/02/16 1153  12/03/16 0359 12/03/16 1305 12/03/16 1950  12/04/16 1113  NA  --   --  137  --   --  135  K  --   < > 4.1 4.1 4.0 3.6  CL  --   --  99*  --   --  99*  CO2  --   --  27  --   --  27  GLUCOSE  --   --  176*  --   --  246*  BUN  --   --  26*  --   --  33*  CREATININE 2.29*  --  2.36*  --   --  2.81*  CALCIUM  --   --  8.6*  --   --  8.0*  MG  --   --   --  1.4* 2.9*  --   < > = values in this interval not displayed.  Liver Function Tests: No results for input(s): AST, ALT, ALKPHOS, BILITOT, PROT, ALBUMIN in the last 168 hours. No results for input(s): LIPASE, AMYLASE in the last 168 hours. No results for input(s): AMMONIA in the last 168 hours.  CBC:  Recent Labs Lab 11/28/16 1315 12/02/16 1153 12/03/16 0359  WBC 7.2 7.9 4.2  NEUTROABS 6.0  --   --   HGB 11.4* 13.0 11.5*  HCT 33.9* 39.6 34.8*  MCV 89.4 90.3 91.7  PLT 122* 125* 100*    Cardiac Enzymes:  Recent Labs Lab 12/02/16 1153  TROPONINI 11.63*    BNP: Invalid input(s): POCBNP  CBG:  Recent Labs Lab 12/03/16 1610 12/03/16 2141 12/04/16 0722 12/04/16 1124 12/04/16 1702  GLUCAP 183* 130* 104* 204* 179*    Microbiology: Recent Results (from the past 720 hour(s))  MRSA PCR Screening     Status: None   Collection Time: 12/02/16 11:57 AM  Result Value Ref Range Status   MRSA by PCR NEGATIVE NEGATIVE Final    Comment:        The GeneXpert MRSA Assay (FDA approved for NASAL specimens only), is one component of a comprehensive MRSA colonization surveillance program. It is not intended to diagnose MRSA infection nor to guide or monitor treatment for MRSA infections.      Coagulation Studies: No results for input(s): LABPROT, INR in the last 72 hours.  Urinalysis: No results for input(s): COLORURINE, LABSPEC, PHURINE, GLUCOSEU, HGBUR, BILIRUBINUR, KETONESUR, PROTEINUR, UROBILINOGEN, NITRITE, LEUKOCYTESUR in the last 72 hours.  Invalid input(s): APPERANCEUR      Imaging:  No results found.   Assessment & Plan: Pt is a 81 y.o.  Caucasian female with Coronary disease, cardiomyopathy, congestive heart failure LVEF 30-35%, chronic edema, diabetes, significant history of GI bleed due to AV malformations requiring multiple units of blood transfusion since 2013, valvular heart disease including aortic stenosis, mitral stenosis, who was admitted to [facility] on 12/02/2016 for evaluation of sudden onset of anterior chest pain. She was diagnosed with acute inferior ST elevation MI which occurred due to thrombotic occlusion of previous stents in the proximal to mid segment of the RCA. She underwent successful balloon angioplasty of the right coronary on 12/11   1. Acute renal failure 2. Chronic kidney disease stage 4. Baseline creatinine 1.64/GFR 29 on 09/26/2016 3. Chronic lower extremity edema due to valvular heart disease 4. Mitral stenosis, aortic stenosis, systolic congestive heart failure EF 30-35% 5. STEMI post coronary angioplasty this admission  Underlying chronic kidney disease appears to be multifactorial from diabetes and atherosclerosis. Acute renal failure is likely secondary to ATN secondary to IV contrast exposure, hypotension during arrhythmias and possibly at home when she had acute MI.  Plan: Maintain hemodynamic stability Avoid further IV contrast exposure/nephrotoxins Avoid hypotension Avoid IV fluids to prevent volume overload Monitor renal function daily We will follow

## 2016-12-04 NOTE — Progress Notes (Signed)
Pt had a 5 beat run of vtach then approximately an hour later an 8 beat run. Cardiology MD paged, verbal order to start back amiodarone at '30mg'$ /hr. Will continue to monitor.

## 2016-12-04 NOTE — Progress Notes (Signed)
MEDICATION RELATED CONSULT NOTE - Electrolyte Management    Pharmacy Consult for electrolyte management   Pharmacy consulted for electrolyte management for 81 yo female admitted with STEMI and had episode of Vfib/[place] on 12/12 ~1245. Goal electrolytes are potassium > 4 and magnesium > 2.    Plan:  12/13  @ 20:00:  K = 3.6 No electrolyte supplementation needed at this time.  Will recheck electrolytes on 12/14 with AM labs.     Allergies  Allergen Reactions  . Aspirin Other (See Comments)    bleeding  . Atenolol Other (See Comments)    fatigue  . Clopidogrel Bisulfate Other (See Comments)    bleeding  . Darvon [Propoxyphene] Hives and Other (See Comments)  . Hydrochlorothiazide Other (See Comments)    Fatigue and cramps  . Iodinated Diagnostic Agents Other (See Comments)    Questionable GI bleed  . Nylon Hives  . Other Other (See Comments)    Nylon sutures  . Pravastatin Other (See Comments)    Severe cramping    Patient Measurements: Height: '5\' 5"'$  (165.1 cm) Weight: 166 lb 7.2 oz (75.5 kg) IBW/kg (Calculated) : 57   Vital Signs: Temp: 97.7 F (36.5 C) (12/13 1100) BP: 143/55 (12/13 1100) Pulse Rate: 64 (12/13 1100) Intake/Output from previous day: 12/12 0701 - 12/13 0700 In: 2432.2 [P.O.:480; I.V.:1952.2] Out: 350 [Urine:350] Intake/Output from this shift: Total I/O In: 303.4 [P.O.:120; I.V.:183.4] Out: -   Labs:  Recent Labs  12/02/16 1153 12/03/16 0359 12/03/16 1305 12/03/16 1950 12/04/16 1113  WBC 7.9 4.2  --   --   --   HGB 13.0 11.5*  --   --   --   HCT 39.6 34.8*  --   --   --   PLT 125* 100*  --   --   --   CREATININE 2.29* 2.36*  --   --  2.81*  MG  --   --  1.4* 2.9*  --    Estimated Creatinine Clearance: 16.5 mL/min (by C-G formula based on SCr of 2.81 mg/dL (H)).  Pharmacy will continue to monitor and adjust per consult.    Pernell Dupre, PharmD Clinical Pharmacist  12/04/2016,1:15 PM

## 2016-12-04 NOTE — Consult Note (Signed)
Consultation Note Date: 12/04/2016   Patient Name: Morgan [name]  DOB: [birth year]  MRN: [id number]  Age / Sex: 81 y.o., female  PCP: Morgan Ruths, MD Referring Physician: Max Sane, MD  Reason for Consultation: Establishing goals of care  HPI/Patient Profile: 81 y.o. female  with past medical history of COPD, DM, CHF (EF 30-35), CAD s/p 2 stents, ESRN on PD, GIB secondary to AVMs, Aortic stenosis, and fatty liver disease who was admitted on 12/02/2016 from the cath lab.  She came to the hospital with chest pain and found to have a thrombosed RCA stent.  She underwent balloon angioplasty.  She was not on antiplatelet therapy due to GI bleeding in July 2017 (AVMs that received APC from Dr. Cristina [name]).  She will now be medically managed with aspirin and plavix for at least 1 month, and then 1 antiplatelet going forward.  Yesterday 12/12 Ms. Morgan [name] had a syncopal episode in her room at [facility] after experiencing torsades de point.   Clinical Assessment and Goals of Care: I met at bedside with Morgan [name].  She is extremely pleasant, sitting up eating her pancakes.  She tells me she lives with her son, and she walks at home with a walker, she has tremendous family support and a private aide that is with her 4 hours a day on week days.  Her grand daughter who is a Paramedic in college means the world to her and she is looking forward to a visit from the grand daughter this evening.  She is [facility] and would appreciate prayers.  She has been eating less recently, but still getting around.  She no longer drives.  Her brother and sister encourage her to keep going and for now that is what she intends to do.  We talked about Palliative care and home health services.  I reached out to her son and left a voice mail.    I spoke with her Sister, Morgan [name].  We discussed her diagnosis and the concerned for bleeding.  We also  discussed the need for PT / OT evaluations to determine skilled rehab was appropriate.  We discussed palliative services at home or at [facility]   Primary Decision Maker:  PATIENT    SUMMARY OF RECOMMENDATIONS    Palliative care to follow at home or in SNF for symptoms and support.  If going to SNF please type Palliative Care to follow at SNF in the discharge summary.  Code Status/Advance Care Planning:  DNR   Symptom Management:   Per primary team.  Psycho-social/Spiritual:   Desire for further Chaplaincy support:  Yes, patient is [employer]  Prognosis:   < 12 months  Discharge Planning: To Be Determined      Primary Diagnoses: Present on Admission: . STEMI involving right coronary artery ([place]) . ST elevation myocardial infarction (STEMI) of inferior wall ([place])   I have reviewed the medical record, interviewed the patient and family, and examined the patient. The following aspects are pertinent.  Past Medical History:  Diagnosis  Date  . Abnormal LFTs (liver function tests)   . Adenomatous polyps 03/05/2012  . Angiodysplasia   . Aortic stenosis   . Arthritis   . AV malformation of GI tract   . Bilateral cellulitis of lower leg   . CAD (coronary artery disease)   . Cardiomyopathy ([place])   . Carotid bruit   . Cervical cancer ([place])    cervical cancer history  . CHF (congestive heart failure) ([place])   . Chronic edema   . Clostridium difficile infection March 02, 2015  . Complication of anesthesia     I have trouble waking up "  . COPD (chronic obstructive pulmonary disease) ([place])   . Diabetes mellitus with stage 3 chronic kidney disease ([place])   . Fatty liver   . Fibrocystic breast changes   . GERD (gastroesophageal reflux disease)   . GI bleed   . Gout   . History of blood transfusion   . History of nicotine use   . Hyperlipidemia   . Hypertension   . Iron deficiency anemia 2008  . Irritable bowel syndrome   . Macrocytosis   . Osteoarthritis   . Peptic ulcer  disease   . PVD (peripheral vascular disease) ([place])   . Tobacco abuse   . Trigeminal neuralgia   . Vascular malformation 06/14/1997   Social History   Social History  . Marital status: Married    Spouse name: N/A  . Number of children: N/A  . Years of education: N/A   Social History Main Topics  . Smoking status: Former Smoker    Packs/day: 0.50    Years: 56.00    Types: Cigarettes    Quit date: 05/23/2013  . Smokeless tobacco: Never Used  . Alcohol use No  . Drug use: No  . Sexual activity: No   Other Topics Concern  . None   Social History Narrative  . None   Family History  Problem Relation Age of Onset  . Parkinsonism Mother   . Heart attack Mother   . Lung cancer Father   . Diabetes Mellitus II Father   . Asthma Sister   . Asthma Brother   . Heart disease Son    Scheduled Meds: . amiodarone  200 mg Oral BID  . aspirin  81 mg Oral Daily  . clopidogrel  75 mg Oral Q breakfast  . heparin subcutaneous  5,000 Units Subcutaneous Q8H  . insulin aspart  0-5 Units Subcutaneous QHS  . insulin aspart  0-9 Units Subcutaneous TID WC  . mouth rinse  15 mL Mouth Rinse BID  . metoprolol succinate  25 mg Oral Daily  . pantoprazole  40 mg Oral BID  . simvastatin  20 mg Oral QHS  . sodium chloride flush  3 mL Intravenous Q12H  . sodium chloride flush  3 mL Intravenous Q12H   Continuous Infusions: . sodium chloride 75 mL/hr at 12/04/16 0800   PRN Meds:.sodium chloride, acetaminophen **OR** acetaminophen, HYDROcodone-acetaminophen, ondansetron **OR** ondansetron (ZOFRAN) IV, sodium chloride flush Allergies  Allergen Reactions  . Aspirin Other (See Comments)    bleeding  . Atenolol Other (See Comments)    fatigue  . Clopidogrel Bisulfate Other (See Comments)    bleeding  . Darvon [Propoxyphene] Hives and Other (See Comments)  . Hydrochlorothiazide Other (See Comments)    Fatigue and cramps  . Iodinated Diagnostic Agents Other (See Comments)    Questionable GI bleed   . Nylon Hives  . Other Other (See Comments)  Nylon sutures  . Pravastatin Other (See Comments)    Severe cramping   Review of Systems  Constitutional: Positive for activity change and appetite change.  HENT: Negative.   Eyes: Negative.   Respiratory: Negative.   Cardiovascular: Positive for chest pain and leg swelling.       Chronic leg swelling.  Wears wraps on lower extremities.  Gastrointestinal: Negative.   Endocrine: Negative.   Genitourinary: Negative.   Musculoskeletal: Positive for arthralgias and gait problem.  Skin: Negative.   Allergic/Immunologic: Negative.   Neurological: Positive for dizziness and weakness.  Psychiatric/Behavioral: Negative.      Physical Exam  Constitutional: She is oriented to person, place, and time. She appears well-developed and well-nourished.  Very pleasant elderly female.  A&O with clear speech  HENT:  Head: Normocephalic and atraumatic.  Eyes: EOM are normal. Pupils are equal, round, and reactive to light. No scleral icterus.  Neck: Normal range of motion. Neck supple.  Cardiovascular: Normal rate.   Murmur heard. BL lower ext swelling.  Pulmonary/Chest: Breath sounds normal. No respiratory distress.  On nasal canula.  Mild SOB when speaking.  Abdominal: Soft. Bowel sounds are normal. She exhibits no distension.  Musculoskeletal: She exhibits edema. She exhibits no deformity.  Neurological: She is alert and oriented to person, place, and time.  Skin: Skin is warm and dry.  Psychiatric: She has a normal mood and affect. Her behavior is normal. Judgment and thought content normal.      Vital Signs: BP (!) 143/55   Pulse 64   Temp 97.7 F (36.5 C)   Resp 15   Ht 5' 5"  (1.651 m)   Wt 75.5 kg (166 lb 7.2 oz)   SpO2 100%   BMI 27.70 kg/m  Pain Assessment: No/denies pain   Pain Score: 0-No pain   SpO2: SpO2: 100 % O2 Device:SpO2: 100 % O2 Flow Rate: .O2 Flow Rate (L/min): 2 L/min  IO: Intake/output summary:    Intake/Output Summary (Last 24 hours) at 12/04/16 1414 Last data filed at 12/04/16 0800  Gross per 24 hour  Intake          2372.63 ml  Output              350 ml  Net          2022.63 ml    LBM: Last BM Date: 12/04/16 Baseline Weight: Weight: 75.5 kg (166 lb 7.2 oz) Most recent weight: Weight: 75.5 kg (166 lb 7.2 oz)     Palliative Assessment/Data:   Flowsheet Rows   Flowsheet Row Most Recent Value  Intake Tab  Referral Department  Hospitalist  Unit at Time of Referral  Intermediate Care Unit  Palliative Care Primary Diagnosis  Other (Comment)  Date Notified  12/03/16  Palliative Care Type  New Palliative care  Reason for referral  Clarify Goals of Care, Counsel Regarding Hospice  Date of Admission  12/02/16  Date first seen by Palliative Care  12/04/16  # of days Palliative referral response time  1 Day(s)  # of days IP prior to Palliative referral  1  Clinical Assessment  Palliative Performance Scale Score  30%  Psychosocial & Spiritual Assessment  Palliative Care Outcomes  Patient/Family meeting held?  Yes  Who was at the meeting?  patient  Palliative Care Outcomes  Clarified goals of care, Counseled regarding hospice      Time In: 10:00  Time Out: 11:00 Time Total: 60 min. Greater than 50%  of this time was  spent counseling and coordinating care related to the above assessment and plan.  Signed by: Imogene Burn, PA-C Palliative Medicine Pager: [phone]  Please contact Palliative Medicine Team phone at [phone] for questions and concerns.  For individual provider: See Shea Evans

## 2016-12-04 NOTE — Progress Notes (Signed)
Pt unable to urinate, bladder scan performed and 24m in bladder. md notified and instructed to I&O cath if >3038mof urine found. Will continue to monitor.

## 2016-12-04 NOTE — Progress Notes (Signed)
[place] at [place] NAME: Morgan [name]    MR#:  [id number]  DATE OF BIRTH:  1935/02/03  SUBJECTIVE:  CHIEF COMPLAINT:  No chief complaint on file. Feeling much better, REVIEW OF SYSTEMS:  Review of Systems  Constitutional: Positive for malaise/fatigue. Negative for chills, fever and weight loss.  HENT: Negative for nosebleeds and sore throat.   Eyes: Negative for blurred vision.  Respiratory: Negative for cough, shortness of breath and wheezing.   Cardiovascular: Positive for palpitations. Negative for chest pain, orthopnea, leg swelling and PND.  Gastrointestinal: Negative for abdominal pain, constipation, diarrhea, heartburn, nausea and vomiting.  Genitourinary: Negative for dysuria and urgency.  Musculoskeletal: Negative for back pain.  Skin: Negative for rash.  Neurological: Positive for weakness. Negative for dizziness, speech change, focal weakness and headaches.  Endo/Heme/Allergies: Does not bruise/bleed easily.  Psychiatric/Behavioral: Negative for depression.    DRUG ALLERGIES:   Allergies  Allergen Reactions  . Aspirin Other (See Comments)    bleeding  . Atenolol Other (See Comments)    fatigue  . Clopidogrel Bisulfate Other (See Comments)    bleeding  . Darvon [Propoxyphene] Hives and Other (See Comments)  . Hydrochlorothiazide Other (See Comments)    Fatigue and cramps  . Iodinated Diagnostic Agents Other (See Comments)    Questionable GI bleed  . Nylon Hives  . Other Other (See Comments)    Nylon sutures  . Pravastatin Other (See Comments)    Severe cramping   VITALS:  Blood pressure (!) 125/50, pulse (!) 55, temperature 98.1 F (36.7 C), temperature source Oral, resp. rate 19, height '5\' 5"'$  (1.651 m), weight 75.5 kg (166 lb 7.2 oz), SpO2 100 %. PHYSICAL EXAMINATION:  Physical Exam  Constitutional: She is oriented to person, place, and time and well-developed, well-nourished, and in no distress.  HENT:    Head: Normocephalic and atraumatic.  Eyes: Conjunctivae and EOM are normal. Pupils are equal, round, and reactive to light.  Neck: Normal range of motion. Neck supple. No tracheal deviation present. No thyromegaly present.  Cardiovascular: Normal rate, regular rhythm and normal heart sounds.   Pulmonary/Chest: Effort normal and breath sounds normal. No respiratory distress. She has no wheezes. She exhibits no tenderness.  Abdominal: Soft. Bowel sounds are normal. She exhibits no distension. There is no tenderness.  Musculoskeletal: Normal range of motion.  Neurological: She is alert and oriented to person, place, and time. No cranial nerve deficit.  Skin: Skin is warm and dry. No rash noted.  Psychiatric: Mood and affect normal.   LABORATORY PANEL:   CBC  Recent Labs Lab 12/03/16 0359  WBC 4.2  HGB 11.5*  HCT 34.8*  PLT 100*   ------------------------------------------------------------------------------------------------------------------ Chemistries   Recent Labs Lab 12/03/16 1950 12/04/16 1113  NA  --  135  K 4.0 3.6  CL  --  99*  CO2  --  27  GLUCOSE  --  246*  BUN  --  33*  CREATININE  --  2.81*  CALCIUM  --  8.0*  MG 2.9*  --    RADIOLOGY:  No results found. ASSESSMENT AND PLAN:  81 year old female with a history of multiple AVMs with GI bleed not on aspirin and Plavix due to complications of severe bleeding who presents with ST elevation MI status post cardiac catheterization which shows thrombosed RCA stent.   1. ST elevation MI: status post cardiac catheterization with RCA stent thrombosis. - continue aspirin and Plavix. The patient has history  of recurrent GI bleed. She should be treated with aspirin and Plavix for at least one month and if tolerated she should stay on at least one agent after that. She is at high risk for acute stent thrombosis. Unfortunately, she is not a good candidate for a 2 B 3A inhibitor due to prohibitive bleeding risk. Continue  statin and metoprolol. Due to low blood pressure will hold on ACE inhibitor.  2. Torsades de Pointes/nonsustained ventricular tachycardia - Likely due to low magnesium repleted and now doing well - 5 beat run of vtach at night then approximately an hour later an 8 beat run - Cardiology started amiodarone drip followed by switch over to oral amiodarone  3. History of GI bleeds with AV malformations: Monitor CBC closely on aspirin and Plavix.  4. Acute on CKD 2/3: - creat 1.64 -> 2.36->2.81 - Nephro c/s  5. Hyperlipidemia: Continue statin    transfer her to telemetry  All the records are reviewed and case discussed with Care Management/Social Worker. Management plans discussed with the patient, family and they are in agreement.  CODE STATUS: DO NOT RESUSCITATE, per palliative care consultation  TOTAL TIME (Critical care) TAKING CARE OF THIS PATIENT: 35 minutes.   More than 50% of the time was spent in counseling/coordination of care: YES  POSSIBLE D/C IN 2-3 DAYS, DEPENDING ON CLINICAL CONDITION.    Max Sane M.D on 12/04/2016 at 5:47 PM  Between 7am to 6pm - Pager - [phone]  After 6pm go to [email or web address] - Proofreader  [facility]  Office  [phone]  CC: Primary care physician; Kirk Ruths., MD  Note: This dictation was prepared with Dragon dictation along with smaller phrase technology. Any transcriptional errors that result from this process are unintentional.

## 2016-12-05 LAB — BASIC METABOLIC PANEL
ANION GAP: 7 (ref 5–15)
BUN: 33 mg/dL — ABNORMAL HIGH (ref 6–20)
CHLORIDE: 104 mmol/L (ref 101–111)
CO2: 27 mmol/L (ref 22–32)
Calcium: 8.1 mg/dL — ABNORMAL LOW (ref 8.9–10.3)
Creatinine, Ser: 2.69 mg/dL — ABNORMAL HIGH (ref 0.44–1.00)
GFR calc Af Amer: 18 mL/min — ABNORMAL LOW (ref >60)
GFR, EST NON AFRICAN AMERICAN: 16 mL/min — AB (ref >60)
GLUCOSE: 125 mg/dL — AB (ref 65–99)
POTASSIUM: 4 mmol/L (ref 3.5–5.1)
Sodium: 138 mmol/L (ref 135–145)

## 2016-12-05 LAB — CBC
HCT: 30.9 % — ABNORMAL LOW (ref 35.0–47.0)
HEMOGLOBIN: 10.5 g/dL — AB (ref 12.0–16.0)
MCH: 31 pg (ref 26.0–34.0)
MCHC: 33.9 g/dL (ref 32.0–36.0)
MCV: 91.3 fL (ref 80.0–100.0)
Platelets: 102 10*3/uL — ABNORMAL LOW (ref 150–440)
RBC: 3.39 MIL/uL — AB (ref 3.80–5.20)
RDW: 19.1 % — ABNORMAL HIGH (ref 11.5–14.5)
WBC: 4.9 10*3/uL (ref 3.6–11.0)

## 2016-12-05 LAB — MAGNESIUM: Magnesium: 2.3 mg/dL (ref 1.7–2.4)

## 2016-12-05 LAB — GLUCOSE, CAPILLARY
GLUCOSE-CAPILLARY: 137 mg/dL — AB (ref 65–99)
GLUCOSE-CAPILLARY: 231 mg/dL — AB (ref 65–99)
Glucose-Capillary: 130 mg/dL — ABNORMAL HIGH (ref 65–99)
Glucose-Capillary: 142 mg/dL — ABNORMAL HIGH (ref 65–99)

## 2016-12-05 MED ORDER — SIMVASTATIN 40 MG PO TABS: 40.0000 mg | ORAL_TABLET | Freq: Every day | ORAL | Status: DC

## 2016-12-05 MED ORDER — LISINOPRIL 5 MG PO TABS
5.0000 mg | ORAL_TABLET | Freq: Every day | ORAL | Status: DC
  Administered 2016-12-05: 5 mg via ORAL
  Filled 2016-12-05: qty 1

## 2016-12-05 MED ORDER — ATORVASTATIN CALCIUM 20 MG PO TABS
20.0000 mg | ORAL_TABLET | Freq: Every day | ORAL | Status: DC
  Administered 2016-12-05: 20 mg via ORAL
  Filled 2016-12-05: qty 1

## 2016-12-05 NOTE — Progress Notes (Signed)
MEDICATION RELATED CONSULT NOTE - Electrolyte Management    Pharmacy Consult for electrolyte management   Pharmacy consulted for electrolyte management for 81 yo female admitted with STEMI and had episode of Vfib/Tors[place] de [place] on 12/12 ~1245. Goal electrolytes are potassium > 4 and magnesium > 2.    Plan:  12/13  @ 20:00:  K = 3.6 No electrolyte supplementation needed at this time.  Will recheck electrolytes on 12/14 with AM labs.   12/14: Electrolytes WNL. Will f/u with am labs.     Allergies  Allergen Reactions  . Aspirin Other (See Comments)    bleeding  . Atenolol Other (See Comments)    fatigue  . Clopidogrel Bisulfate Other (See Comments)    bleeding  . Darvon [Propoxyphene] Hives and Other (See Comments)  . Hydrochlorothiazide Other (See Comments)    Fatigue and cramps  . Iodinated Diagnostic Agents Other (See Comments)    Questionable GI bleed  . Nylon Hives  . Other Other (See Comments)    Nylon sutures  . Pravastatin Other (See Comments)    Severe cramping    Patient Measurements: Height: '5\' 5"'$  (165.1 cm) Weight: 168 lb 9.6 oz (76.5 kg) IBW/kg (Calculated) : 57   Vital Signs: Temp: 97.5 F (36.4 C) (12/14 0401) Temp Source: Oral (12/14 0401) BP: 139/50 (12/14 0401) Pulse Rate: 63 (12/14 0401) Intake/Output from previous day: 12/13 0701 - 12/14 0700 In: 1143.4 [P.O.:360; I.V.:783.4] Out: 498 [Urine:498] Intake/Output from this shift: No intake/output data recorded.  Labs:  Recent Labs  12/02/16 1153 12/03/16 0359 12/03/16 1305 12/03/16 1950 12/04/16 1113 12/05/16 0611  WBC 7.9 4.2  --   --   --  4.9  HGB 13.0 11.5*  --   --   --  10.5*  HCT 39.6 34.8*  --   --   --  30.9*  PLT 125* 100*  --   --   --  102*  CREATININE 2.29* 2.36*  --   --  2.81* 2.69*  MG  --   --  1.4* 2.9*  --  2.3    Lab Results  Component Value Date   K 4.0 12/05/2016   Estimated Creatinine Clearance: 17.3 mL/min (by C-G formula based on SCr of 2.69 mg/dL  (H)).  Pharmacy will continue to monitor and adjust per consult.    Noralee Space, PharmD Clinical Pharmacist  12/05/2016,7:47 AM

## 2016-12-05 NOTE — Progress Notes (Signed)
[place] at [place] NAME: Arthella Headings    MR#:  [id number]  DATE OF BIRTH:  April 02, 1935  SUBJECTIVE:  CHIEF COMPLAINT:  No chief complaint on file. Feeling much better, weak REVIEW OF SYSTEMS:  Review of Systems  Constitutional: Positive for malaise/fatigue. Negative for chills, fever and weight loss.  HENT: Negative for nosebleeds and sore throat.   Eyes: Negative for blurred vision.  Respiratory: Negative for cough, shortness of breath and wheezing.   Cardiovascular: Positive for palpitations. Negative for chest pain, orthopnea, leg swelling and PND.  Gastrointestinal: Negative for abdominal pain, constipation, diarrhea, heartburn, nausea and vomiting.  Genitourinary: Negative for dysuria and urgency.  Musculoskeletal: Negative for back pain.  Skin: Negative for rash.  Neurological: Positive for weakness. Negative for dizziness, speech change, focal weakness and headaches.  Endo/Heme/Allergies: Does not bruise/bleed easily.  Psychiatric/Behavioral: Negative for depression.    DRUG ALLERGIES:   Allergies  Allergen Reactions  . Aspirin Other (See Comments)    bleeding  . Atenolol Other (See Comments)    fatigue  . Clopidogrel Bisulfate Other (See Comments)    bleeding  . Darvon [Propoxyphene] Hives and Other (See Comments)  . Hydrochlorothiazide Other (See Comments)    Fatigue and cramps  . Iodinated Diagnostic Agents Other (See Comments)    Questionable GI bleed  . Nylon Hives  . Other Other (See Comments)    Nylon sutures  . Pravastatin Other (See Comments)    Severe cramping   VITALS:  Blood pressure (!) 135/44, pulse (!) 55, temperature 97.8 F (36.6 C), temperature source Oral, resp. rate 16, height '5\' 5"'$  (1.651 m), weight 76.5 kg (168 lb 9.6 oz), SpO2 99 %. PHYSICAL EXAMINATION:  Physical Exam  Constitutional: She is oriented to person, place, and time and well-developed, well-nourished, and in no distress.    HENT:  Head: Normocephalic and atraumatic.  Eyes: Conjunctivae and EOM are normal. Pupils are equal, round, and reactive to light.  Neck: Normal range of motion. Neck supple. No tracheal deviation present. No thyromegaly present.  Cardiovascular: Normal rate, regular rhythm and normal heart sounds.   Pulmonary/Chest: Effort normal and breath sounds normal. No respiratory distress. She has no wheezes. She exhibits no tenderness.  Abdominal: Soft. Bowel sounds are normal. She exhibits no distension. There is no tenderness.  Musculoskeletal: Normal range of motion.  Neurological: She is alert and oriented to person, place, and time. No cranial nerve deficit.  Skin: Skin is warm and dry. No rash noted.  Psychiatric: Mood and affect normal.   LABORATORY PANEL:   CBC  Recent Labs Lab 12/05/16 0611  WBC 4.9  HGB 10.5*  HCT 30.9*  PLT 102*   ------------------------------------------------------------------------------------------------------------------ Chemistries   Recent Labs Lab 12/05/16 0611  NA 138  K 4.0  CL 104  CO2 27  GLUCOSE 125*  BUN 33*  CREATININE 2.69*  CALCIUM 8.1*  MG 2.3   RADIOLOGY:  No results found. ASSESSMENT AND PLAN:  81 year old female with a history of multiple AVMs with GI bleed not on aspirin and Plavix due to complications of severe bleeding who presents with ST elevation MI status post cardiac catheterization which shows thrombosed RCA stent.   1. ST elevation MI: status post cardiac catheterization with RCA stent thrombosis. - continue aspirin and Plavix. The patient has history of recurrent GI bleed. She should be treated with aspirin and Plavix for at least one month and if tolerated she should stay on at least  one agent after that. She is at high risk for acute stent thrombosis. Unfortunately, she is not a good candidate for a 2 B 3A inhibitor due to prohibitive bleeding risk. Continue statin and metoprolol. start ACE inhibitor per  nephro  2. Torsades de Pointes/nonsustained ventricular tachycardia - Likely due to low magnesium repleted and now doing well - 5 beat run of vtach at night then approximately an hour later an 8 beat run - Cardiology started amiodarone drip followed by switch over to oral amiodarone  3. History of GI bleeds with AV malformations: Monitor CBC closely on aspirin and Plavix.  4. Acute on CKD 2/3: due to ATN, multifactorial - creat 1.64 -> 2.36->2.81->2.69 - Nephro following  5. Hyperlipidemia: Continue statin    transfer her to telemetry  All the records are reviewed and case discussed with Care Management/Social Worker. Management plans discussed with the patient, family and they are in agreement.  CODE STATUS: DO NOT RESUSCITATE, per palliative care consultation  TOTAL TIME TAKING CARE OF THIS PATIENT: 35 minutes.   More than 50% of the time was spent in counseling/coordination of care: YES  POSSIBLE D/C IN 1-2 DAYS, DEPENDING ON CLINICAL CONDITION. And placement    Max Sane M.D on 12/05/2016 at 3:47 PM  Between 7am to 6pm - Pager - [phone]  After 6pm go to [email or web address] - Proofreader  [facility]  Office  [phone]  CC: Primary care physician; Kirk Ruths., MD  Note: This dictation was prepared with Dragon dictation along with smaller phrase technology. Any transcriptional errors that result from this process are unintentional.

## 2016-12-05 NOTE — Evaluation (Signed)
Physical Therapy Evaluation Patient Details Name: Morgan [name] MRN: [id number] DOB: Oct 11, 1935 Today's Date: 12/05/2016   History of Present Illness  Morgan [name] is a 81 y.o. female with a known history of CAD with right coronary artery stent and GI bleed due to AV malformations who presents with chest pain. Patient reports sudden onset of substernal chest pain which was not radiating this morning which woke her up from sleep. Her pain was initially 10 out of 10. She was found to have an ST elevation MI. She was brought to the cardiac catheter lab where she was found to have a thrombosed RCA stent. She underwent balloon angioplasty. Pt with history of GIB with AVM as well as hyperlipidemia.   Clinical Impression  Pt admitted with above diagnosis. Pt currently with functional limitations due to the deficits listed below (see PT Problem List). Pt is very weak and deconditioned. She demonstrates poor balance with transfers and ambulation. Ambulation distance is very limited due to DOE and quick fatigue. Pt will need SNF placement at discharge in order to facilitate safe return home. Pt will benefit from skilled PT services to address deficits in strength, balance, and mobility in order to return to full function at home.     Follow Up Recommendations SNF    Equipment Recommendations  None recommended by PT    Recommendations for Other Services       Precautions / Restrictions Precautions Precautions: Fall Restrictions Weight Bearing Restrictions: No      Mobility  Bed Mobility Overal bed mobility: Needs Assistance Bed Mobility: Supine to Sit;Sit to Supine     Supine to sit: Min assist Sit to supine: Min assist   General bed mobility comments: Pt requires assist for bed mobiilty due to weakness and difficulty sequencing. HOB elevated and bed rail utilized  Transfers Overall transfer level: Needs assistance Equipment used: Rolling walker (2 wheeled) Transfers: Sit to/from  Stand Sit to Stand: Min assist;+2 physical assistance         General transfer comment: Pt requires assist to come to standing due to LE weakness and poor balance. Pt with posterior LOB requiring assist to remain upright in standing  Ambulation/Gait Ambulation/Gait assistance: Min assist;+2 safety/equipment Ambulation Distance (Feet): 24 Feet (12'+12') Assistive device: Rolling walker (2 wheeled) Gait Pattern/deviations: Decreased step length - right;Decreased step length - left Gait velocity: Decreased Gait velocity interpretation: <1.8 ft/sec, indicative of risk for recurrent falls General Gait Details: Pt ambulate to ambulate forward/backwards 12' x 2 with sitting rest break between. Pt reports fatigue and DOE during ambulation. Vitals monitored continuosly throughout and SaO2 remains at or above 95% on 1L/min supplemental O2. Pt unsteady with ambulation and requires cues for appropriate use of walker  Stairs            Wheelchair Mobility    Modified Rankin (Stroke Patients Only)       Balance Overall balance assessment: Needs assistance Sitting-balance support: No upper extremity supported Sitting balance-Leahy Scale: Fair     Standing balance support: No upper extremity supported Standing balance-Leahy Scale: Poor                               Pertinent Vitals/Pain Pain Assessment: No/denies pain    Home Living Family/patient expects to be discharged to:: Private residence Living Arrangements: Children Available Help at Discharge: Family Type of Home: House Home Access: Ramped entrance     Home Layout: Multi-level;Able to  live on main level with bedroom/bathroom Home Equipment: Gilford Rile - 2 wheels;Walker - 4 wheels;Cane - single point;Bedside commode;Shower seat;Grab bars - tub/shower;Hospital bed Additional Comments: Sleeps in recliner    Prior Function Level of Independence: Needs assistance   Gait / Transfers Assistance Needed: Independent  for household ambulation with rollator  ADL's / Homemaking Assistance Needed: Independent with ADLs, assist from family with IADLs. One fall in the last 12 months        Hand Dominance   Dominant Hand: Right    Extremity/Trunk Assessment   Upper Extremity Assessment Upper Extremity Assessment: Generalized weakness    Lower Extremity Assessment Lower Extremity Assessment: Generalized weakness       Communication   Communication: No difficulties  Cognition Arousal/Alertness: Awake/alert Behavior During Therapy: WFL for tasks assessed/performed Overall Cognitive Status: Within Functional Limits for tasks assessed                      General Comments      Exercises     Assessment/Plan    PT Assessment Patient needs continued PT services  PT Problem List Decreased strength;Decreased activity tolerance;Decreased balance;Decreased safety awareness;Cardiopulmonary status limiting activity          PT Treatment Interventions DME instruction;Gait training;Therapeutic activities;Therapeutic exercise;Balance training;Cognitive remediation;Patient/family education    PT Goals (Current goals can be found in the Care Plan section)  Acute Rehab PT Goals Patient Stated Goal: Return to prior level of function at home PT Goal Formulation: With patient Time For Goal Achievement: 12/19/16 Potential to Achieve Goals: Fair    Frequency Min 2X/week   Barriers to discharge   Poor mobility and balance    Co-evaluation               End of Session Equipment Utilized During Treatment: Gait belt;Oxygen;Other (comment) (1 L/min O2) Activity Tolerance: Patient limited by fatigue Patient left: in bed;with call bell/phone within reach;with bed alarm set;with nursing/sitter in room Nurse Communication: Mobility status         Time: 1610-9604 PT Time Calculation (min) (ACUTE ONLY): 27 min   Charges:   PT Evaluation $PT Eval Moderate Complexity: 1 Procedure PT  Treatments $Gait Training: 8-22 mins   PT G Codes:       Lyndel Safe Huprich PT, DPT   Huprich,Jason 12/05/2016, 9:45 AM

## 2016-12-05 NOTE — Progress Notes (Signed)
[place]  SUBJECTIVE: Feels a little better. No chest pain.    Vitals:   12/04/16 2000 12/05/16 0000 12/05/16 0300 12/05/16 0401  BP: (!) 115/49  (!) 124/53 (!) 139/50  Pulse: (!) 50  (!) 50 63  Resp: (!) '21  19 16  '$ Temp:  97.7 F (36.5 C)  97.5 F (36.4 C)  TempSrc:  Oral  Oral  SpO2: 100%  95% 98%  Weight:    168 lb 9.6 oz (76.5 kg)  Height:        Intake/Output Summary (Last 24 hours) at 12/05/16 0708 Last data filed at 12/05/16 0411  Gross per 24 hour  Intake           1143.4 ml  Output              498 ml  Net            645.4 ml    LABS: Basic Metabolic Panel:  Recent Labs  12/03/16 1950 12/04/16 1113 12/05/16 0611  NA  --  135 138  K 4.0 3.6 4.0  CL  --  99* 104  CO2  --  27 27  GLUCOSE  --  246* 125*  BUN  --  33* 33*  CREATININE  --  2.81* 2.69*  CALCIUM  --  8.0* 8.1*  MG 2.9*  --  2.3   Liver Function Tests: No results for input(s): AST, ALT, ALKPHOS, BILITOT, PROT, ALBUMIN in the last 72 hours. No results for input(s): LIPASE, AMYLASE in the last 72 hours. CBC:  Recent Labs  12/03/16 0359 12/05/16 0611  WBC 4.2 4.9  HGB 11.5* 10.5*  HCT 34.8* 30.9*  MCV 91.7 91.3  PLT 100* 102*   Cardiac Enzymes:  Recent Labs  12/02/16 1153  TROPONINI 11.63*   BNP: Invalid input(s): POCBNP D-Dimer: No results for input(s): DDIMER in the last 72 hours. Hemoglobin A1C:  Recent Labs  12/02/16 1153  HGBA1C 6.3*   Fasting Lipid Panel:  Recent Labs  12/03/16 0359  CHOL 108  HDL 52  LDLCALC 40  TRIG 82  CHOLHDL 2.1   Thyroid Function Tests:  Recent Labs  12/02/16 1153  TSH 3.549   Anemia Panel: No results for input(s): VITAMINB12, FOLATE, FERRITIN, TIBC, IRON, RETICCTPCT in the last 72 hours.   Physical Exam: Blood pressure (!) 139/50, pulse 63, temperature 97.5 F (36.4 C), temperature source Oral, resp. rate 16, height '5\' 5"'$  (1.651 m), weight 168 lb 9.6 oz (76.5 kg), SpO2 98 %.   Wt  Readings from Last 1 Encounters:  12/05/16 168 lb 9.6 oz (76.5 kg)     General appearance: alert and cooperative Head: Normocephalic, without obvious abnormality, atraumatic Resp: clear to auscultation bilaterally Cardio: regular rate and rhythm GI: soft, non-tender; bowel sounds normal; no masses,  no organomegaly Extremities: extremities normal, atraumatic, no cyanosis or edema Neurologic: Grossly normal  TELEMETRY: Reviewed telemetry pt in nsr and sinus bradycardia:  ASSESSMENT AND PLAN:  Active Problems:   STEMI involving right coronary artery (HCC)-s/p POBA of acute thrombosis of rca. Will attempt to continue with asa and plavix. NO evidence of bleed at present. No further ischemia clinically. Will continue with simvastatin but will increase to 40 mg daily for high intensity dosing. Will likely need to go to rehab facility temporarily to improve stability after discharge. Will need referral for phase II cardiac rehab as part of this when stable.    ST elevation myocardial infarction (STEMI)  of inferior wall (HCC)-as per above   Pressure injury of skin   Palliative care encounter   Goals of care, counseling/discussion  VT/Torsades.-no further vt or torsades since loading with amiodarone and converting to 200 bid po. Will D/C metoprolol due to bradycardia. Increase ambulation with assistance today.     Teodoro Spray, MD, [facility] 12/05/2016 7:08 AM

## 2016-12-05 NOTE — Consult Note (Signed)
[place] Nurse wound follow up Wound type:Unnas boots replaced every Thursday.   Measurement: Skin intact Wound bed:N/A Drainage (amount, consistency, odor) None Periwound:N/A Dressing procedure/placement/frequency:Remove existing wraps.  Clean bilateral legs with soap and water and pat gently dry.  Apply zinc layer, secured with self adherent Coban.  Change weekly on Thursday.  Will not follow at this time.  Please re-consult if needed.  Domenic Moras RN BSN [place] Pager [phone]

## 2016-12-05 NOTE — Care Management (Signed)
Patient wanted some water, I told her Nurse.

## 2016-12-05 NOTE — Clinical Social Work Note (Signed)
MSW spoke to patient and her family regarding SNF placement.  MSW explained process of looking for beds and how insurance will pay for her stay.  MSW gave patient's family list of SNF options, MSW was given permission to fax patient out for SNF placement.  Formal assessment to follow, MSW to continue to follow patient's progress throughout discharge planning.  Jones Broom. Norval Morton, MSW [phone]  Mon-Fri 8a-4:30p 12/05/2016 5:12 PM

## 2016-12-05 NOTE — Care Management (Signed)
Patient transferred to 2A  12/14. Attending informed CM that patient most likely will require SNF. It appears PT and OT consults are pending.  Palliative care has consulted and recommending palliative services either at home or at skilled nursing facility, should this be required.  She is currently receiving home health services through [facility].  Updated CSW

## 2016-12-05 NOTE — Progress Notes (Signed)
Subjective:   Doing fair today Transferred out of ICU Multiple family members at the bedside S Cr slightly lower at 2.69  Objective:  Vital signs in last 24 hours:  Temp:  [97.5 F (36.4 C)-98.1 F (36.7 C)] 97.8 F (36.6 C) (12/14 1123) Pulse Rate:  [50-77] 55 (12/14 1123) Resp:  [16-23] 16 (12/14 0401) BP: (114-149)/(44-94) 135/44 (12/14 1123) SpO2:  [92 %-100 %] 99 % (12/14 1123) Weight:  [76.5 kg (168 lb 9.6 oz)] 76.5 kg (168 lb 9.6 oz) (12/14 0401)  Weight change:  Filed Weights   12/02/16 1139 12/02/16 1400 12/05/16 0401  Weight: 75.5 kg (166 lb 7.2 oz) 75.5 kg (166 lb 7.2 oz) 76.5 kg (168 lb 9.6 oz)    Intake/Output:    Intake/Output Summary (Last 24 hours) at 12/05/16 1242 Last data filed at 12/05/16 1241  Gross per 24 hour  Intake              840 ml  Output              648 ml  Net              192 ml     Physical Exam: General: NAD, laying in bed  HEENT Anicteric, moist oral mucus membranes  Neck supple  Pulm/lungs Clear b/l   CVS/Heart No rub. Soft systolic murmur  Abdomen:  Soft, NT, Non distended  Extremities: + dependent edema, Legs wrapped  Neurologic: Alert, oreinted  Skin: No acute edema          Basic Metabolic Panel:   Recent Labs Lab 12/02/16 1153 12/03/16 0359 12/03/16 1305 12/03/16 1950 12/04/16 1113 12/05/16 0611  NA  --  137  --   --  135 138  K  --  4.1 4.1 4.0 3.6 4.0  CL  --  99*  --   --  99* 104  CO2  --  27  --   --  27 27  GLUCOSE  --  176*  --   --  246* 125*  BUN  --  26*  --   --  33* 33*  CREATININE 2.29* 2.36*  --   --  2.81* 2.69*  CALCIUM  --  8.6*  --   --  8.0* 8.1*  MG  --   --  1.4* 2.9*  --  2.3     CBC:  Recent Labs Lab 11/28/16 1315 12/02/16 1153 12/03/16 0359 12/05/16 0611  WBC 7.2 7.9 4.2 4.9  NEUTROABS 6.0  --   --   --   HGB 11.4* 13.0 11.5* 10.5*  HCT 33.9* 39.6 34.8* 30.9*  MCV 89.4 90.3 91.7 91.3  PLT 122* 125* 100* 102*      Microbiology:  Recent Results (from the past  720 hour(s))  MRSA PCR Screening     Status: None   Collection Time: 12/02/16 11:57 AM  Result Value Ref Range Status   MRSA by PCR NEGATIVE NEGATIVE Final    Comment:        The GeneXpert MRSA Assay (FDA approved for NASAL specimens only), is one component of a comprehensive MRSA colonization surveillance program. It is not intended to diagnose MRSA infection nor to guide or monitor treatment for MRSA infections.     Coagulation Studies: No results for input(s): LABPROT, INR in the last 72 hours.  Urinalysis: No results for input(s): COLORURINE, LABSPEC, PHURINE, GLUCOSEU, HGBUR, BILIRUBINUR, KETONESUR, PROTEINUR, UROBILINOGEN, NITRITE, LEUKOCYTESUR in the last 72 hours.  Invalid  input(s): APPERANCEUR    Imaging: No results found.   Medications:    . amiodarone  200 mg Oral BID  . aspirin  81 mg Oral Daily  . clopidogrel  75 mg Oral Q breakfast  . heparin subcutaneous  5,000 Units Subcutaneous Q8H  . insulin aspart  0-5 Units Subcutaneous QHS  . insulin aspart  0-9 Units Subcutaneous TID WC  . mouth rinse  15 mL Mouth Rinse BID  . pantoprazole  40 mg Oral BID  . simvastatin  40 mg Oral QHS  . sodium chloride flush  3 mL Intravenous Q12H  . sodium chloride flush  3 mL Intravenous Q12H   sodium chloride, acetaminophen **OR** acetaminophen, HYDROcodone-acetaminophen, ondansetron **OR** ondansetron (ZOFRAN) IV, sodium chloride flush  Assessment/ Plan:  81 y.o.caucasian female with Coronary disease, cardiomyopathy, congestive heart failure LVEF 30-35%, chronic edema, diabetes, significant history of GI bleed due to AV malformations requiring multiple units of blood transfusion since 2013, valvular heart disease including aortic stenosis, mitral stenosis, who was admitted to [facility] 12/02/2016 for evaluation of sudden onset of anterior chest pain. She was diagnosed with acute inferior ST elevation MI which occurred due to thrombotic occlusion of previous stents in the  proximal to mid segment of the RCA. She underwent successful balloon angioplasty of the right coronary on 12/11   1. Acute renal failure 2. Chronic kidney disease stage 4. Baseline creatinine 1.64/GFR 29 on 09/26/2016 3. Chronic lower extremity edema due to valvular heart disease 4. Mitral stenosis, aortic stenosis, systolic congestive heart failure EF 30-35% 5. STEMI post coronary angioplasty this admission  Underlying chronic kidney disease appears to be multifactorial from diabetes and atherosclerosis. Acute renal failure is likely secondary to ATN secondary to IV contrast exposure, hypotension during arrhythmias and possibly at home when she had acute MI.  Plan: Maintain hemodynamic stability Avoid further IV contrast exposure/nephrotoxins Avoid hypotension Avoid IV fluids to prevent volume overload Monitor renal function daily Start ACE-i We will follow   LOS: 3 Zimri Brennen 12/14/201712:42 PM

## 2016-12-05 NOTE — Progress Notes (Signed)
Medication Interaction  Patients on Amiodarone and Simvastatin >20 mg/day have reported cases of rhabdomyolysis.  Assess simvastatin dose. If >20 mg, consider substitute atorvastatin (Lipitor) '1mg'$  for each '2mg'$  simvastatin.   Chinita Greenland PharmD Clinical Pharmacist 12/05/2016

## 2016-12-06 ENCOUNTER — Encounter
Admission: RE | Admit: 2016-12-06 | Discharge: 2016-12-06 | Disposition: A | Discharge status: Home / Self Care | Payer: Medicare Other | Source: Ambulatory Visit | Attending: Internal Medicine | Admitting: Internal Medicine

## 2016-12-06 LAB — BASIC METABOLIC PANEL WITH GFR
Anion gap: 5 (ref 5–15)
BUN: 31 mg/dL — ABNORMAL HIGH (ref 6–20)
CO2: 28 mmol/L (ref 22–32)
Calcium: 8.5 mg/dL — ABNORMAL LOW (ref 8.9–10.3)
Chloride: 105 mmol/L (ref 101–111)
Creatinine, Ser: 2.34 mg/dL — ABNORMAL HIGH (ref 0.44–1.00)
GFR calc Af Amer: 22 mL/min — ABNORMAL LOW
GFR calc non Af Amer: 19 mL/min — ABNORMAL LOW
Glucose, Bld: 135 mg/dL — ABNORMAL HIGH (ref 65–99)
Potassium: 4.4 mmol/L (ref 3.5–5.1)
Sodium: 138 mmol/L (ref 135–145)

## 2016-12-06 LAB — MAGNESIUM: Magnesium: 2.2 mg/dL (ref 1.7–2.4)

## 2016-12-06 LAB — GLUCOSE, CAPILLARY
Glucose-Capillary: 136 mg/dL — ABNORMAL HIGH (ref 65–99)
Glucose-Capillary: 161 mg/dL — ABNORMAL HIGH (ref 65–99)
Glucose-Capillary: 133 mg/dL — ABNORMAL HIGH (ref 65–99)

## 2016-12-06 LAB — CBC
HCT: 32.7 % — ABNORMAL LOW (ref 35.0–47.0)
Hemoglobin: 10.8 g/dL — ABNORMAL LOW (ref 12.0–16.0)
MCH: 30.5 pg (ref 26.0–34.0)
MCHC: 32.9 g/dL (ref 32.0–36.0)
MCV: 92.6 fL (ref 80.0–100.0)
PLATELETS: 94 10*3/uL — AB (ref 150–440)
RBC: 3.53 MIL/uL — ABNORMAL LOW (ref 3.80–5.20)
RDW: 18.9 % — ABNORMAL HIGH (ref 11.5–14.5)
WBC: 4.9 10*3/uL (ref 3.6–11.0)

## 2016-12-06 MED ORDER — ATORVASTATIN CALCIUM 40 MG PO TABS: 40.0000 mg | ORAL_TABLET | Freq: Every day | ORAL | 0 refills | Status: AC

## 2016-12-06 MED ORDER — SIMVASTATIN 40 MG PO TABS: 40.0000 mg | ORAL_TABLET | Freq: Every day | ORAL | 0 refills | Status: DC

## 2016-12-06 MED ORDER — AMIODARONE HCL 200 MG PO TABS: 200.0000 mg | ORAL_TABLET | Freq: Two times a day (BID) | ORAL | 0 refills | Status: DC

## 2016-12-06 MED ORDER — ASPIRIN 81 MG PO CHEW: 81.0000 mg | CHEWABLE_TABLET | Freq: Every day | ORAL | 0 refills | Status: DC

## 2016-12-06 MED ORDER — CLOPIDOGREL BISULFATE 75 MG PO TABS: 75.0000 mg | ORAL_TABLET | Freq: Every day | ORAL | 0 refills | Status: DC

## 2016-12-06 MED ORDER — HEPARIN SOD (PORK) LOCK FLUSH 100 UNIT/ML IV SOLN
500.0000 [IU] | Freq: Once | INTRAVENOUS | Status: AC
  Administered 2016-12-06: 500 [IU] via INTRAVENOUS
  Filled 2016-12-06: qty 5

## 2016-12-06 NOTE — Discharge Summary (Addendum)
[place] [facility] [place] NAME: Morgan [name]    MR#:  [id number]  DATE OF BIRTH:  08-20-1935  DATE OF ADMISSION:  12/02/2016   ADMITTING PHYSICIAN: Invasive Cardiologist Cardiant, MD  DATE OF DISCHARGE: 12/06/2016  PRIMARY CARE PHYSICIAN: Kirk Ruths., MD   ADMISSION DIAGNOSIS:  STEMI involving right coronary artery ([place]) [I21.11] DISCHARGE DIAGNOSIS:  Active Problems:   STEMI involving right coronary artery (HCC)   ST elevation myocardial infarction (STEMI) of inferior wall (HCC)   Pressure injury of skin   Palliative care encounter   Goals of care, counseling/discussion  SECONDARY DIAGNOSIS:   Past Medical History:  Diagnosis Date  . Abnormal LFTs (liver function tests)   . Adenomatous polyps 03/05/2012  . Angiodysplasia   . Aortic stenosis   . Arthritis   . AV malformation of GI tract   . Bilateral cellulitis of lower leg   . CAD (coronary artery disease)   . Cardiomyopathy ([place])   . Carotid bruit   . Cervical cancer ([place])    cervical cancer history  . CHF (congestive heart failure) ([place])   . Chronic edema   . Clostridium difficile infection March 02, 2015  . Complication of anesthesia     I have trouble waking up "  . COPD (chronic obstructive pulmonary disease) ([place])   . Diabetes mellitus with stage 3 chronic kidney disease ([place])   . Fatty liver   . Fibrocystic breast changes   . GERD (gastroesophageal reflux disease)   . GI bleed   . Gout   . History of blood transfusion   . History of nicotine use   . Hyperlipidemia   . Hypertension   . Iron deficiency anemia 2008  . Irritable bowel syndrome   . Macrocytosis   . Osteoarthritis   . Peptic ulcer disease   . PVD (peripheral vascular disease) ([place])   . Tobacco abuse   . Trigeminal neuralgia   . Vascular malformation 06/14/1997   HOSPITAL COURSE:  81 year old female with a history of multiple AVMs with GI bleed not on aspirin and Plavix due to  complications of severe bleeding admitted with ST elevation MI status post cardiac catheterization which shows thrombosed RCA stent.   1. ST elevation MI: status post cardiac catheterization with RCA stent thrombosis. - continue aspirin, Plavix, statin, metoprolol and ACE inhibitor. The patient has history of recurrent GI bleed. She should be treated with aspirin and Plavix for at least one month and if tolerated she should stay on at least one agent after that. - outpt cardiac rehab  2. [place]/nonsustained ventricular tachycardia - Likely due to low magnesium repleted and now doing well - controlled on oral amiodarone  3. History of GI bleeds with AV malformations: Monitor CBC closely on aspirin and Plavix.  4. Acute on CKD 2/3: due to ATN, multifactorial - creat 1.64 -> 2.36->2.81->2.69->2.34  5. Hyperlipidemia: Continue statin   DISCHARGE CONDITIONS:  stable CONSULTS OBTAINED:  Treatment Team:  Yolonda Kida, MD Teodoro Spray, MD Murlean Iba, MD DRUG ALLERGIES:   Allergies  Allergen Reactions  . Aspirin Other (See Comments)    bleeding  . Atenolol Other (See Comments)    fatigue  . Clopidogrel Bisulfate Other (See Comments)    bleeding  . Darvon [Propoxyphene] Hives and Other (See Comments)  . Hydrochlorothiazide Other (See Comments)    Fatigue and cramps  . Iodinated Diagnostic Agents Other (See Comments)    Questionable GI  bleed  . Nylon Hives  . Other Other (See Comments)    Nylon sutures  . Pravastatin Other (See Comments)    Severe cramping   DISCHARGE MEDICATIONS:   Allergies as of 12/06/2016      Reactions   Aspirin Other (See Comments)   bleeding   Atenolol Other (See Comments)   fatigue   Clopidogrel Bisulfate Other (See Comments)   bleeding   Darvon [propoxyphene] Hives, Other (See Comments)   Hydrochlorothiazide Other (See Comments)   Fatigue and cramps   Iodinated Diagnostic Agents Other (See Comments)   Questionable  GI bleed   Nylon Hives   Other Other (See Comments)   Nylon sutures   Pravastatin Other (See Comments)   Severe cramping      Medication List    STOP taking these medications   simvastatin 20 MG tablet Commonly known as:  ZOCOR     TAKE these medications   acetaminophen 500 MG tablet Commonly known as:  TYLENOL Take 500 mg by mouth at bedtime.   amiodarone 200 MG tablet Commonly known as:  PACERONE Take 1 tablet (200 mg total) by mouth 2 (two) times daily.   amLODipine 2.5 MG tablet Commonly known as:  NORVASC Take 1 tablet (2.5 mg total) by mouth daily.   aspirin 81 MG chewable tablet Chew 1 tablet (81 mg total) by mouth daily.   atorvastatin 40 MG tablet Commonly known as:  LIPITOR Take 1 tablet (40 mg total) by mouth daily at 6 PM.   clopidogrel 75 MG tablet Commonly known as:  PLAVIX Take 1 tablet (75 mg total) by mouth daily with breakfast.   diclofenac sodium 1 % Gel Commonly known as:  VOLTAREN Apply 2 g topically 4 (four) times daily as needed. For pain   lisinopril 40 MG tablet Commonly known as:  PRINIVIL,ZESTRIL Take 40 mg by mouth daily.   metolazone 10 MG tablet Commonly known as:  ZAROXOLYN Take 10 mg by mouth daily as needed. For fluid/swelling   metoprolol succinate 50 MG 24 hr tablet Commonly known as:  TOPROL-XL Take 50 mg by mouth daily.   pantoprazole 40 MG tablet Commonly known as:  PROTONIX Take 40 mg by mouth 2 (two) times daily.   potassium chloride 10 MEQ tablet Commonly known as:  K-DUR Take 10 mEq by mouth 2 (two) times daily.   spironolactone 25 MG tablet Commonly known as:  ALDACTONE Take 25 mg by mouth daily.   torsemide 100 MG tablet Commonly known as:  DEMADEX Take 100 mg by mouth daily.      DISCHARGE INSTRUCTIONS:  CBC, BMP in 1 week with results to PCP & nephrology DIET:  Cardiac diet DISCHARGE CONDITION:  Good ACTIVITY:  Activity as tolerated OXYGEN:  Home Oxygen: No.  Oxygen Delivery: room  air DISCHARGE LOCATION:  nursing home with palliative care to follow while at the facility  If you experience worsening of your admission symptoms, develop shortness of breath, life threatening emergency, suicidal or homicidal thoughts you must seek medical attention immediately by calling 911 or calling your MD immediately  if symptoms less severe.  You Must read complete instructions/literature along with all the possible adverse reactions/side effects for all the Medicines you take and that have been prescribed to you. Take any new Medicines after you have completely understood and accpet all the possible adverse reactions/side effects.   Please note  You were cared for by a hospitalist during your hospital stay. If you have any questions  about your discharge medications or the care you received while you were in the hospital after you are discharged, you can call the unit and asked to speak with the hospitalist on call if the hospitalist that took care of you is not available. Once you are discharged, your primary care physician will handle any further medical issues. Please note that NO REFILLS for any discharge medications will be authorized once you are discharged, as it is imperative that you return to your primary care physician (or establish a relationship with a primary care physician if you do not have one) for your aftercare needs so that they can reassess your need for medications and monitor your lab values.    On the day of Discharge:  VITAL SIGNS:  Blood pressure (!) 154/59, pulse 81, temperature 98.2 F (36.8 C), temperature source Oral, resp. rate 18, height '5\' 5"'$  (1.651 m), weight 76.5 kg (168 lb 9.6 oz), SpO2 92 %. PHYSICAL EXAMINATION:  GENERAL:  81 y.o.-year-old patient lying in the bed with no acute distress.  EYES: Pupils equal, round, reactive to light and accommodation. No scleral icterus. Extraocular muscles intact.  HEENT: Head atraumatic, normocephalic. Oropharynx  and nasopharynx clear.  NECK:  Supple, no jugular venous distention. No thyroid enlargement, no tenderness.  LUNGS: Normal breath sounds bilaterally, no wheezing, rales,rhonchi or crepitation. No use of accessory muscles of respiration.  CARDIOVASCULAR: S1, S2 normal. No murmurs, rubs, or gallops.  ABDOMEN: Soft, non-tender, non-distended. Bowel sounds present. No organomegaly or mass.  EXTREMITIES: No pedal edema, cyanosis, or clubbing.  NEUROLOGIC: Cranial nerves II through XII are intact. Muscle strength 5/5 in all extremities. Sensation intact. Gait not checked.  PSYCHIATRIC: The patient is alert and oriented x 3.  SKIN: No obvious rash, lesion, or ulcer.  DATA REVIEW:   CBC  Recent Labs Lab 12/06/16 0500  WBC 4.9  HGB 10.8*  HCT 32.7*  PLT 94*    Chemistries   Recent Labs Lab 12/06/16 0500  NA 138  K 4.4  CL 105  CO2 28  GLUCOSE 135*  BUN 31*  CREATININE 2.34*  CALCIUM 8.5*  MG 2.2    Follow-up Information    Kirk Ruths., MD. Go on 12/24/2017.   Specialty:  Internal Medicine Why:  Hospital Follow-up: appointment at 10:00am  Contact information: [facility] [address] [place] [address] [phone]        Teodoro Spray, MD. Go on 12/09/2016.   Specialty:  Cardiology Why:  Hospital Follow-up: appointment at [place] information: [place] [address] [place], [place] On 12/19/2016.   Specialty:  Internal Medicine Why:  Hospital Follow-up: appointment at 11:30am Contact information: [place] [address] [phone]            Management plans discussed with the patient, family and they are in agreement.  CODE STATUS: DNR, palliative care to follow while at the facility  [place] THIS PATIENT: 45 minutes.    Max Sane M.D on 12/06/2016 at 10:14 AM  Between 7am to 6pm  - Pager - [phone]  After 6pm go to [email or web address] - Proofreader  [facility]  Office  [phone]  CC: Primary care physician; Kirk Ruths., MD   Note: This dictation was prepared with Dragon dictation along with smaller phrase technology. Any transcriptional errors that result from this  process are unintentional.

## 2016-12-06 NOTE — Progress Notes (Signed)
MEDICATION RELATED CONSULT NOTE - Electrolyte Management    Pharmacy Consult for electrolyte management   Pharmacy consulted for electrolyte management for 81 yo female admitted with STEMI and had episode of Vfib/[place] on 12/12 ~1245. Goal electrolytes are potassium > 4 and magnesium > 2.    Plan:  K 4.4, Mag 2.2 No supplementation needed at this time. Pt with d/c orders, will continue to follow if pt still here tomorrow.     Allergies  Allergen Reactions  . Aspirin Other (See Comments)    bleeding  . Atenolol Other (See Comments)    fatigue  . Clopidogrel Bisulfate Other (See Comments)    bleeding  . Darvon [Propoxyphene] Hives and Other (See Comments)  . Hydrochlorothiazide Other (See Comments)    Fatigue and cramps  . Iodinated Diagnostic Agents Other (See Comments)    Questionable GI bleed  . Nylon Hives  . Other Other (See Comments)    Nylon sutures  . Pravastatin Other (See Comments)    Severe cramping    Patient Measurements: Height: '5\' 5"'$  (165.1 cm) Weight: 168 lb 9.6 oz (76.5 kg) IBW/kg (Calculated) : 57   Vital Signs: Temp: 98.2 F (36.8 C) (12/15 0840) Temp Source: Oral (12/15 0840) BP: 154/59 (12/15 0840) Pulse Rate: 81 (12/15 0840) Intake/Output from previous day: 12/14 0701 - 12/15 0700 In: 240 [P.O.:240] Out: 450 [Urine:450] Intake/Output from this shift: Total I/O In: -  Out: 125 [Urine:125]  Labs:  Recent Labs  12/03/16 1950 12/04/16 1113 12/05/16 0611 12/06/16 0500  WBC  --   --  4.9 4.9  HGB  --   --  10.5* 10.8*  HCT  --   --  30.9* 32.7*  PLT  --   --  102* 94*  CREATININE  --  2.81* 2.69* 2.34*  MG 2.9*  --  2.3 2.2    Lab Results  Component Value Date   K 4.4 12/06/2016   Estimated Creatinine Clearance: 19.9 mL/min (by C-G formula based on SCr of 2.34 mg/dL (H)).  Pharmacy will continue to monitor and adjust per consult.    Rocky Morel, PharmD Clinical Pharmacist  12/06/2016,9:56 AM

## 2016-12-06 NOTE — Care Management Important Message (Signed)
Important Message  Patient Details  Name: Morgan [name] MRN: [id number] Date of Birth: 26-May-1935   Medicare Important Message Given:  Yes    Katrina Stack, RN 12/06/2016, 2:44 PM

## 2016-12-06 NOTE — Clinical Social Work Placement (Addendum)
   CLINICAL SOCIAL WORK PLACEMENT  NOTE  Date:  12/06/2016  Patient Details  Name: CAMISHA SREY MRN: [id number] Date of Birth: 04-24-1935  Clinical Social Work is seeking post-discharge placement for this patient at the [place] level of care (*CSW will initial, date and re-position this form in  chart as items are completed):  Yes   Patient/family provided with [facility] Work Department's list of facilities offering this level of care within the geographic area requested by the patient (or if unable, by the patient's family).  Yes   Patient/family informed of their freedom to choose among providers that offer the needed level of care, that participate in Medicare, Medicaid or managed care program needed by the patient, have an available bed and are willing to accept the patient.  Yes   Patient/family informed of [facility]'s ownership interest in [facility] and [facility], as well as of the fact that they are under no obligation to receive care at these facilities.  PASRR submitted to EDS on 12/06/16     PASRR number received on 12/06/16     Existing PASRR number confirmed on       FL2 transmitted to all facilities in geographic area requested by pt/family on 12/06/16     FL2 transmitted to all facilities within larger geographic area on       Patient informed that his/her managed care company has contracts with or will negotiate with certain facilities, including the following:        Yes   Patient/family informed of bed offers received.  Patient chooses bed at [facility]     Physician recommends and patient chooses bed at      Patient to be transferred to [facility] on 12/06/16.  Patient to be transferred to facility by [facility]     Patient family notified on 12/06/16 of transfer.  Name of family member notified:  [name] patient's son     PHYSICIAN Please sign DNR, Please sign FL2      Additional Comment:    _______________________________________________ Ross Ludwig, LCSWA 12/06/2016, 1:25 PM

## 2016-12-06 NOTE — Progress Notes (Signed)
Subjective:   Doing fair today Sitting up inchair Multiple family members at the bedside S Cr slightly lower at 2.34  Objective:  Vital signs in last 24 hours:  Temp:  [97.5 F (36.4 C)-98.2 F (36.8 C)] 97.6 F (36.4 C) (12/15 1109) Pulse Rate:  [66-81] 70 (12/15 1109) Resp:  [18] 18 (12/15 1109) BP: (133-156)/(41-63) 139/56 (12/15 1109) SpO2:  [92 %-100 %] 93 % (12/15 1109)  Weight change:  Filed Weights   12/02/16 1139 12/02/16 1400 12/05/16 0401  Weight: 75.5 kg (166 lb 7.2 oz) 75.5 kg (166 lb 7.2 oz) 76.5 kg (168 lb 9.6 oz)    Intake/Output:    Intake/Output Summary (Last 24 hours) at 12/06/16 1414 Last data filed at 12/06/16 1336  Gross per 24 hour  Intake              240 ml  Output              625 ml  Net             -385 ml     Physical Exam: General: NAD,    HEENT Anicteric, moist oral mucus membranes  Neck supple  Pulm/lungs Clear b/l   CVS/Heart No rub. Soft systolic murmur  Abdomen:  Soft, NT, Non distended  Extremities: + dependent edema, Legs wrapped  Neurologic: Alert, oreinted  Skin: No acute edema          Basic Metabolic Panel:   Recent Labs Lab 12/02/16 1153  12/03/16 0359 12/03/16 1305 12/03/16 1950 12/04/16 1113 12/05/16 0611 12/06/16 0500  NA  --   --  137  --   --  135 138 138  K  --   < > 4.1 4.1 4.0 3.6 4.0 4.4  CL  --   --  99*  --   --  99* 104 105  CO2  --   --  27  --   --  '27 27 28  '$ GLUCOSE  --   --  176*  --   --  246* 125* 135*  BUN  --   --  26*  --   --  33* 33* 31*  CREATININE 2.29*  --  2.36*  --   --  2.81* 2.69* 2.34*  CALCIUM  --   < > 8.6*  --   --  8.0* 8.1* 8.5*  MG  --   --   --  1.4* 2.9*  --  2.3 2.2  < > = values in this interval not displayed.   CBC:  Recent Labs Lab 12/02/16 1153 12/03/16 0359 12/05/16 0611 12/06/16 0500  WBC 7.9 4.2 4.9 4.9  HGB 13.0 11.5* 10.5* 10.8*  HCT 39.6 34.8* 30.9* 32.7*  MCV 90.3 91.7 91.3 92.6  PLT 125* 100* 102* 94*      Microbiology:  Recent  Results (from the past 720 hour(s))  MRSA PCR Screening     Status: None   Collection Time: 12/02/16 11:57 AM  Result Value Ref Range Status   MRSA by PCR NEGATIVE NEGATIVE Final    Comment:        The GeneXpert MRSA Assay (FDA approved for NASAL specimens only), is one component of a comprehensive MRSA colonization surveillance program. It is not intended to diagnose MRSA infection nor to guide or monitor treatment for MRSA infections.     Coagulation Studies: No results for input(s): LABPROT, INR in the last 72 hours.  Urinalysis: No results for input(s): COLORURINE, LABSPEC,  PHURINE, GLUCOSEU, HGBUR, BILIRUBINUR, KETONESUR, PROTEINUR, UROBILINOGEN, NITRITE, LEUKOCYTESUR in the last 72 hours.  Invalid input(s): APPERANCEUR    Imaging: No results found.   Medications:    . amiodarone  200 mg Oral BID  . aspirin  81 mg Oral Daily  . atorvastatin  20 mg Oral q1800  . clopidogrel  75 mg Oral Q breakfast  . heparin subcutaneous  5,000 Units Subcutaneous Q8H  . insulin aspart  0-5 Units Subcutaneous QHS  . insulin aspart  0-9 Units Subcutaneous TID WC  . lisinopril  5 mg Oral QHS  . mouth rinse  15 mL Mouth Rinse BID  . pantoprazole  40 mg Oral BID  . sodium chloride flush  3 mL Intravenous Q12H  . sodium chloride flush  3 mL Intravenous Q12H   sodium chloride, acetaminophen **OR** acetaminophen, HYDROcodone-acetaminophen, ondansetron **OR** ondansetron (ZOFRAN) IV, sodium chloride flush  Assessment/ Plan:  81 y.o.caucasian female with Coronary disease, cardiomyopathy, congestive heart failure LVEF 30-35%, chronic edema, diabetes, significant history of GI bleed due to AV malformations requiring multiple units of blood transfusion since 2013, valvular heart disease including aortic stenosis, mitral stenosis, who was admitted to [facility] on 12/02/2016 for evaluation of sudden onset of anterior chest pain. She was diagnosed with acute inferior ST elevation MI which occurred  due to thrombotic occlusion of previous stents in the proximal to mid segment of the RCA. She underwent successful balloon angioplasty of the right coronary on 12/11   1. Acute renal failure 2. Chronic kidney disease stage 4. Baseline creatinine 1.64/GFR 29 on 09/26/2016 3. Chronic lower extremity edema due to valvular heart disease 4. Mitral stenosis, aortic stenosis, systolic congestive heart failure EF 30-35% 5. STEMI post coronary angioplasty this admission  Underlying chronic kidney disease appears to be multifactorial from diabetes and atherosclerosis. Acute renal failure is likely secondary to ATN secondary to IV contrast exposure, hypotension during arrhythmias and possibly at home when she had acute MI.  Plan: Maintain hemodynamic stability Avoid further IV contrast exposure/nephrotoxins Avoid hypotension Avoid IV fluids to prevent volume overload Monitor renal function daily continue ACE-i We will arrange outpatient followup   LOS: 4 Morgan [name] 12/15/20172:14 PM

## 2016-12-06 NOTE — Clinical Social Work Note (Signed)
Clinical Social Work Assessment  Patient Details  Name: Morgan [name] MRN: [id number] Date of Birth: 06/13/1935  Date of referral:  12/06/16               Reason for consult:  Facility Placement                Permission sought to share information with:  Family Supports, Customer service manager Permission granted to share information::  Yes, Verbal Permission Granted  Name::     Tawny Hopping   [phone]   Agency::  SNF Admissions  Relationship::     Contact Information:     Housing/Transportation Living arrangements for the past 2 months:  [place] of Information:  Adult Children, Patient Patient Interpreter Needed:  None Criminal Activity/Legal Involvement Pertinent to Current Situation/Hospitalization:  No - Comment as needed Significant Relationships:  Adult Children Lives with:  Adult Children Do you feel safe going back to the place where you live?  No Need for family participation in patient care:  Yes (Comment)  Care giving concerns:  Patient and family feel that she needs some short term rehab before she returns back home.   Social Worker assessment / plan:  Patient is a 81 year old female who lives with her son, patient is alert and oriented x4.  Patient states she has not been to SNF for rehab before, MSW explained to patient what to expect and what the process is for SNF placement.  Patient was explained how insurance will pay for her stay and what the role of the social worker is at SNF to help with discharging plan from SNF.  Patient gave MSW permission to begin bed search process.  Patient and family did not express any other questions or concerns.  Employment status:  Retired Forensic scientist:  Medicare PT Recommendations:  [place] / Referral to community resources:     Patient/Family's Response to care:  Patient and family agreeable to going to SNF for short term rehab.  Patient/Family's  Understanding of and Emotional Response to Diagnosis, Current Treatment, and Prognosis:  Patient and family aware of current treatment plan and prognosis.  Emotional Assessment Appearance:  Appears stated age Attitude/Demeanor/Rapport:    Affect (typically observed):  Appropriate, Calm, Stable, Pleasant Orientation:  Oriented to Self, Oriented to Place, Oriented to  Time, Oriented to Situation Alcohol / Substance use:  Not Applicable Psych involvement (Current and /or in the community):  No (Comment)  Discharge Needs  Concerns to be addressed:  Lack of Support Readmission within the last 30 days:  No Current discharge risk:  None Barriers to Discharge:  No Barriers Identified   Ross Ludwig, LCSWA 12/06/2016, 1:20 PM

## 2016-12-06 NOTE — Discharge Instructions (Signed)
Heart Disease Prevention Heart disease is a leading cause of death. There are many things you can do to help prevent heart disease. Be physically active Physical activity is good for your heart. It helps control your blood pressure, cholesterol levels, and weight. Try to be physically active every day. Ask your health care provider what activities are best for you. Be a healthy weight Extra weight can strain your heart and affect your blood pressure and cholesterol levels. Lose weight with diet and exercise if recommended by your health care provider. Eat heart-healthy foods Follow a healthy eating plan as recommended by your health care provider or dietitian. Heart-healthy foods include:  High-fiber foods. These include oat bran, oatmeal, and whole-grain breads and cereals.  Fruits and vegetables. Avoid:  Alcohol.  Fried foods.  Foods high in saturated fat. These include meats, butter, whole dairy products, shortening, and coconut or palm oil.  Salty foods. These include canned food, luncheon meat, salty snacks, and fast food. Keep your cholesterol levels under control Cholesterol is a substance that is used for many important functions. When your cholesterol levels are high, cholesterol can stick to the insides of your blood vessels, making them narrow or clog. This can lead to chest pain (angina) and a heart attack. Keep your cholesterol levels under control as recommended by your health care provider. Have your cholesterol checked at least once a year. Target cholesterol levels (in mg/dL) for most people are:  Total cholesterol below 200.  LDL cholesterol below 100.  HDL cholesterol above 40 in men and above 50 in women.  Triglycerides below 150. Keep your blood pressure under control Having high blood pressure (hypertension) puts you at risk for stroke and other forms of heart disease. Keep your blood pressure under control as recommended by your health care provider. Ask your  health care provider if you need treatment to lower your blood pressure. If you are 87-26 years of age, have your blood pressure checked every 3-5 years. If you are 9 years of age or older, have your blood pressure checked every year. Do not use tobacco products Tobacco smoke can damage your heart and blood vessels. Do not use any tobacco products including cigarettes, chewing tobacco, or electronic cigarettes. If you need help quitting, ask your health care provider. Take medicines as directed Take medicines only as directed by your health care provider. Ask your health care provider whether you should take an aspirin every day. Taking aspirin can help reduce your risk of heart disease and stroke. Where to find more information: To find out more about heart disease, visit the American Heart Association's website at www.americanheart.org This information is not intended to replace advice given to you by your health care provider. Make sure you discuss any questions you have with your health care provider. Document Released: 07/23/2004 Document Revised: 05/08/2016 Document Reviewed: 02/02/2014 Elsevier Interactive Patient Education  2017 [employer].

## 2016-12-06 NOTE — NC FL2 (Signed)
[place] LEVEL OF CARE SCREENING TOOL     IDENTIFICATION  Patient Name: Morgan [name] Birthdate: February 27, 1935 Sex: female Admission Date (Current Location): 12/02/2016  [place] and [place] Number:  Engineering geologist and Address:  [facility], [address], [place], [place] [address]      Provider Number: [id number]  Attending Physician Name and Address:  Max Sane, MD  Relative Name and Phone Number:  Tawny Hopping   [phone] or [phone]    Current Level of Care: Hospital Recommended Level of Care: [place] Prior Approval Number:    Date Approved/Denied:   PASRR Number: [id number]  Discharge Plan: SNF    Current Diagnoses: Patient Active Problem List   Diagnosis Date Noted  . Palliative care encounter   . Goals of care, counseling/discussion   . Pressure injury of skin 12/03/2016  . STEMI involving right coronary artery ([place]) 12/02/2016  . ST elevation myocardial infarction (STEMI) of inferior wall ([place]) 12/02/2016  . Anemia due to stage 3 chronic kidney disease 09/26/2016  . Iron deficiency anemia, unspecified   . Acute posthemorrhagic anemia   . Blood in stool   . Benign neoplasm of colon   . Angiodysplasia of intestine with hemorrhage   . GIB (gastrointestinal bleeding) 08/22/2016  . AKI (acute kidney injury) ([place]) 08/22/2016  . Rectal bleeding   . Pressure ulcer 07/10/2016  . Thrombocytopenia ([place]) 07/09/2016  . Anemia of chronic kidney failure 06/24/2016  . Iron deficiency anemia due to chronic blood loss 04/29/2016  . Angiodysplasia 05/19/2015  . CA cervix ([place]) 05/19/2015  . Fatty infiltration of liver 05/19/2015  . Increased MCV 05/19/2015  . Gastroduodenal ulcer 05/19/2015  . Hypercholesterolemia without hypertriglyceridemia 03/06/2015  . Diabetes ([place]) 10/25/2014  . Absolute anemia 06/18/2014  . Aortic heart valve narrowing 06/18/2014  . 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  Arteriosclerosis of coronary artery 06/18/2014  . Cardiomyopathy ([place]) 06/18/2014  . Chronic kidney disease (CKD), stage III (moderate) 06/18/2014  . CAFL (chronic airflow limitation) ([place]) 06/18/2014  . Type II diabetes mellitus with neurological manifestations ([place]) 06/18/2014  . Gout 06/18/2014  . Benign hypertension 06/18/2014  . Fothergill's neuralgia 06/18/2014    Orientation RESPIRATION BLADDER Height & Weight     Self, Time, Situation, Place  Normal Continent Weight: 168 lb 9.6 oz (76.5 kg) Height:  '5\' 5"'$  (165.1 cm)  BEHAVIORAL SYMPTOMS/MOOD NEUROLOGICAL BOWEL NUTRITION STATUS      Continent Diet (Cardiac)  AMBULATORY STATUS COMMUNICATION OF NEEDS Skin   Limited Assist Verbally PU Stage and Appropriate Care  Change every 3 days or PRN                     Personal Care Assistance Level of Assistance  Bathing, Feeding, Dressing Bathing Assistance: Limited assistance Feeding assistance: Independent Dressing Assistance: Limited assistance     Functional Limitations Info  Sight, Speech, Hearing Sight Info: Adequate Hearing Info: Impaired Speech Info: Adequate    SPECIAL CARE FACTORS FREQUENCY  PT (By licensed PT)     PT Frequency: 5x a week              Contractures Contractures Info: Not present    Additional Factors Info  Code Status, Allergies, Insulin Sliding Scale Code Status Info: DNR Allergies Info: ASPIRIN, ATENOLOL, CLOPIDOGREL BISULFATE, DARVON PROPOXYPHENE, HYDROCHLOROTHIAZIDE, IODINATED DIAGNOSTIC AGENTS, NYLON, OTHER, PRAVASTATIN    Insulin Sliding Scale Info: insulin aspart (novoLOG) injection 0-9 Units 3x a day with meals  Current Medications (12/06/2016):  This is the current hospital active medication list Current Facility-Administered Medications  Medication Dose Route Frequency Provider Last Rate Last Dose  . 0.9 %  sodium chloride infusion  250 mL Intravenous PRN Wellington Hampshire, MD      . acetaminophen (TYLENOL) tablet 650  mg  650 mg Oral Q6H PRN Bettey Costa, MD       Or  . acetaminophen (TYLENOL) suppository 650 mg  650 mg Rectal Q6H PRN Bettey Costa, MD      . amiodarone (PACERONE) tablet 200 mg  200 mg Oral BID Teodoro Spray, MD   200 mg at 12/05/16 2119  . aspirin chewable tablet 81 mg  81 mg Oral Daily Wellington Hampshire, MD   81 mg at 12/05/16 0912  . atorvastatin (LIPITOR) tablet 20 mg  20 mg Oral q1800 Max Sane, MD   20 mg at 12/05/16 1652  . clopidogrel (PLAVIX) tablet 75 mg  75 mg Oral Q breakfast Wellington Hampshire, MD   75 mg at 12/06/16 0840  . heparin injection 5,000 Units  5,000 Units Subcutaneous Q8H Bettey Costa, MD   5,000 Units at 12/06/16 [phone]  . HYDROcodone-acetaminophen (NORCO/VICODIN) 5-325 MG per tablet 1-2 tablet  1-2 tablet Oral Q4H PRN Bettey Costa, MD      . insulin aspart (novoLOG) injection 0-5 Units  0-5 Units Subcutaneous QHS Bettey Costa, MD   2 Units at 12/05/16 2122  . insulin aspart (novoLOG) injection 0-9 Units  0-9 Units Subcutaneous TID WC Bettey Costa, MD   1 Units at 12/06/16 0840  . lisinopril (PRINIVIL,ZESTRIL) tablet 5 mg  5 mg Oral QHS Murlean Iba, MD   5 mg at 12/05/16 2119  . MEDLINE mouth rinse  15 mL Mouth Rinse BID Sital Mody, MD   15 mL at 12/05/16 1000  . ondansetron (ZOFRAN) tablet 4 mg  4 mg Oral Q6H PRN Bettey Costa, MD       Or  . ondansetron (ZOFRAN) injection 4 mg  4 mg Intravenous Q6H PRN Bettey Costa, MD   4 mg at 12/03/16 1251  . pantoprazole (PROTONIX) EC tablet 40 mg  40 mg Oral BID Bettey Costa, MD   40 mg at 12/05/16 2119  . sodium chloride flush (NS) 0.9 % injection 3 mL  3 mL Intravenous Q12H Bettey Costa, MD   3 mL at 12/05/16 2123  . sodium chloride flush (NS) 0.9 % injection 3 mL  3 mL Intravenous Q12H Wellington Hampshire, MD   3 mL at 12/05/16 0913  . sodium chloride flush (NS) 0.9 % injection 3 mL  3 mL Intravenous PRN Wellington Hampshire, MD       Facility-Administered Medications Ordered in Other Encounters  Medication Dose Route Frequency Provider Last Rate Last  Dose  . acetaminophen (TYLENOL) tablet 650 mg  650 mg Oral Once Cammie Sickle, MD      . diphenhydrAMINE (BENADRYL) capsule 25 mg  25 mg Oral Once Cammie Sickle, MD      . epoetin alfa (EPOGEN,PROCRIT) injection 20,000 Units  20,000 Units Subcutaneous Once Cammie Sickle, MD      . furosemide (LASIX) injection 20 mg  20 mg Intravenous Once Cammie Sickle, MD      . heparin lock flush 100 unit/mL  500 Units Intracatheter Daily PRN Cammie Sickle, MD      . heparin lock flush 100 unit/mL  250 Units Intracatheter PRN Cammie Sickle, MD      .  sodium chloride flush (NS) 0.9 % injection 10 mL  10 mL Intracatheter PRN Cammie Sickle, MD      . sodium chloride flush (NS) 0.9 % injection 3 mL  3 mL Intracatheter PRN Cammie Sickle, MD         Discharge Medications: Please see discharge summary for a list of discharge medications.  Relevant Imaging Results:  Relevant Lab Results:   Additional Information SSN [id number]  Ross Ludwig, [place]

## 2016-12-06 NOTE — Evaluation (Addendum)
Occupational Therapy Evaluation Patient Details Name: Morgan [name] MRN: [id number] DOB: 06-May-1935 Today's Date: 12/06/2016    History of Present Illness Pt. is aa 81 y.o. female who was admitted to [facility] with chest pain from an ST elevation MI. Pt. PMHX includes: CAD with AV malformations, Hx GI bleeds.   Clinical Impression   Pt. Is a 81 y.o. Female was admitted to [facility] with chest pain from an ST elevation MI. Pt. presents with weakness, decreased activity tolerance, and impaired functional mobility mobility during ADLs. Pt. could benefit from skilled OT services for ADL training, A/E training, UE ther. Ex, energy conservation/work simplification techniques, and pt. Education about home modification DME. Pt. Plans to go to SNF at discharge. Pt. Could benefit from follow-up OT services.    Follow Up Recommendations  SNF    Equipment Recommendations       Recommendations for Other Services       Precautions / Restrictions Precautions Precautions: Fall Restrictions Weight Bearing Restrictions: No                                                     ADL  Pt. education was provided about A/E use for LE ADLs, and energy conservation. Pt. was provided with a visual handout.                                             Vision     Perception     Praxis      Pertinent Vitals/Pain Pain Assessment: No/denies pain     Hand Dominance Right   Extremity/Trunk Assessment Upper Extremity Assessment Upper Extremity Assessment: Generalized weakness           Communication Communication Communication: No difficulties   Cognition Arousal/Alertness: Awake/alert Behavior During Therapy: WFL for tasks assessed/performed Overall Cognitive Status: Within Functional Limits for tasks assessed                     General Comments       Exercises       Shoulder Instructions      Home Living Family/patient expects to be  discharged to:: Private residence Living Arrangements: Children Available Help at Discharge: Family Type of Home: House Home Access: Ramped entrance     Home Layout: Multi-level;Able to live on main level with bedroom/bathroom     Bathroom Shower/Tub: Walk-in shower   Bathroom Toilet: Handicapped height     Home Equipment: Environmental consultant - 2 wheels;Walker - 4 wheels;Cane - single point;Bedside commode;Shower seat;Grab bars - tub/shower;Hospital bed          Prior Functioning/Environment Level of Independence: Needs assistance                 OT Problem List: Decreased strength;Decreased activity tolerance;Pain;Decreased knowledge of use of DME or AE   OT Treatment/Interventions: Self-care/ADL training;Therapeutic exercise;Therapeutic activities;Patient/family education;DME and/or AE instruction    OT Goals(Current goals can be found in the care plan section) Acute Rehab OT Goals Patient Stated Goal: To return home OT Goal Formulation: With patient Potential to Achieve Goals: Good  OT Frequency: Min 1X/week   Barriers to D/C:            Co-evaluation  End of Session    Activity Tolerance: Patient tolerated treatment well;Patient limited by fatigue Patient left: in bed   Time: 1350-1410 OT Time Calculation (min): 20 min Charges:  OT General Charges $OT Visit: 1 Procedure OT Evaluation $OT Eval Moderate Complexity: 1 Procedure G-Codes:    Harrel Carina, MS, OTR/L 12/06/2016, 3:11 PM

## 2016-12-06 NOTE — Progress Notes (Signed)
Physical Therapy Treatment Patient Details Name: Morgan [name] MRN: [id number] DOB: [birth year] Today's Date: 12/06/2016    History of Present Illness Morgan [name] is a 81 y.o. female with a known history of CAD with right coronary artery stent and GI bleed due to AV malformations who presents with chest pain. Patient reports sudden onset of substernal chest pain which was not radiating this morning which woke her up from sleep. Her pain was initially 10 out of 10. She was found to have an ST elevation MI. She was brought to the cardiac catheter lab where she was found to have a thrombosed RCA stent. She underwent balloon angioplasty. Pt with history of GIB with AVM as well as hyperlipidemia.     PT Comments    Pt continues to demonstrates significant deconditioning and weakness. However her ambulation distance is improved today and pt able to ambulate approximately 45' with a chair follow. SaO2 drops mostly between 88-90% on room air however at the end of ambulation SaO2 drops to 85% and requires extended pursed lip breathing to recover to 90%. Pt able to complete all seated exercises at EOB as instructed. Pt will need SNF placement at discharge in order to facilitate safe transition home. Pt will benefit from skilled PT services to address deficits in strength, balance, and mobility in order to return to full function at home.    Follow Up Recommendations  SNF     Equipment Recommendations  None recommended by PT    Recommendations for Other Services       Precautions / Restrictions Precautions Precautions: Fall Restrictions Weight Bearing Restrictions: No    Mobility  Bed Mobility Overal bed mobility: Needs Assistance Bed Mobility: Supine to Sit;Sit to Supine     Supine to sit: Min assist     General bed mobility comments: Pt requires assist for bed mobiilty due to weakness and difficulty sequencing. HOB elevated and bed rail utilized. Most assist is for bilateral LE as  well as scooting toward EOB once upright in sitting  Transfers Overall transfer level: Needs assistance Equipment used: Rolling walker (2 wheeled) Transfers: Sit to/from Stand Sit to Stand: Min assist;+2 physical assistance         General transfer comment: Pt requires assist to come to standing due to LE weakness and poor balance. Pt with improved standing balance on this date but still demonstrates increased trunk sway. Heavy UE support required on walker and increased time required to come to standing.  Ambulation/Gait Ambulation/Gait assistance: Min assist;+2 safety/equipment Ambulation Distance (Feet): 80 Feet Assistive device: Rolling walker (2 wheeled) Gait Pattern/deviations: Decreased step length - right;Decreased step length - left Gait velocity: Decreased Gait velocity interpretation: <1.8 ft/sec, indicative of risk for recurrent falls General Gait Details: Pt able to ambulate considerably farther on this date. Vitals monitored througout and HR increases to 98 bpm with ambulation. SaO2 mostly remains around 88-90% but at end of walk drops to 85%. Pt requires approximately 2 minutes of pursed lip breathing for [place]2 to recover to 90% on room air. Pt continues to demonstrate short shuffling steps. Cues for upright posture and safe sequencing with walker during turns in hallway. Chair follow for additional safely due to LE weakness and poor cardipulmonary endurance   Stairs            Wheelchair Mobility    Modified Rankin (Stroke Patients Only)       Balance Overall balance assessment: Needs assistance Sitting-balance support: No upper extremity supported Sitting balance-Leahy  Scale: Fair     Standing balance support: No upper extremity supported Standing balance-Leahy Scale: Poor                      Cognition Arousal/Alertness: Awake/alert Behavior During Therapy: WFL for tasks assessed/performed Overall Cognitive Status: Within Functional Limits  for tasks assessed                      Exercises General Exercises - Lower Extremity Long Arc Quad: Strengthening;Both;10 reps;Seated Heel Slides: Strengthening;Both;10 reps;Seated Hip ABduction/ADduction: Strengthening;Both;10 reps;Seated Hip Flexion/Marching: Strengthening;Both;10 reps;Seated Heel Raises: Strengthening;Both;10 reps;Seated    General Comments        Pertinent Vitals/Pain Pain Assessment: No/denies pain    Home Living                      Prior Function            PT Goals (current goals can now be found in the care plan section) Acute Rehab PT Goals Patient Stated Goal: Return to prior level of function at home PT Goal Formulation: With patient Time For Goal Achievement: 12/19/16 Potential to Achieve Goals: Fair Progress towards PT goals: Progressing toward goals    Frequency    Min 2X/week      PT Plan Current plan remains appropriate    Co-evaluation             End of Session Equipment Utilized During Treatment: Gait belt Activity Tolerance: Patient limited by fatigue Patient left: with call bell/phone within reach;in chair;with chair alarm set     Time: 1027-2536 PT Time Calculation (min) (ACUTE ONLY): 18 min  Charges:  $Gait Training: 8-22 mins                    G Codes:      Morgan [name] PT, DPT   Morgan [name] 12/06/2016, 9:26 AM

## 2016-12-06 NOTE — Progress Notes (Signed)
Patient is discharge to SNF in a sable condition , report given to floor nurse , pick up by ems .

## 2016-12-06 NOTE — Progress Notes (Signed)
New referral for Palliative services at [facility] following discharge received from [place]. Planned discharge for today. Referral made aware. Thank you. Flo Shanks RN, BSN, [place] Liaison [phone] c

## 2016-12-06 NOTE — Clinical Social Work Note (Signed)
Patient to be d/c'ed today to [facility].  Patient and family agreeable to plans will transport via ems RN to call report to [phone] room 207A.  Evette Cristal, MSW Mon-Fri 8a-4:30p [phone]

## 2016-12-12 DIAGNOSIS — I429 Cardiomyopathy, unspecified: Secondary | ICD-10-CM | POA: Diagnosis not present

## 2016-12-12 DIAGNOSIS — I252 Old myocardial infarction: Secondary | ICD-10-CM | POA: Diagnosis not present

## 2016-12-12 DIAGNOSIS — I509 Heart failure, unspecified: Secondary | ICD-10-CM | POA: Diagnosis not present

## 2016-12-12 DIAGNOSIS — I350 Nonrheumatic aortic (valve) stenosis: Secondary | ICD-10-CM | POA: Diagnosis not present

## 2016-12-12 DIAGNOSIS — Z515 Encounter for palliative care: Secondary | ICD-10-CM | POA: Diagnosis not present

## 2016-12-12 DIAGNOSIS — I2510 Atherosclerotic heart disease of native coronary artery without angina pectoris: Secondary | ICD-10-CM | POA: Diagnosis not present

## 2016-12-12 DIAGNOSIS — Z66 Do not resuscitate: Secondary | ICD-10-CM | POA: Diagnosis not present

## 2016-12-12 DIAGNOSIS — J449 Chronic obstructive pulmonary disease, unspecified: Secondary | ICD-10-CM | POA: Diagnosis not present

## 2016-12-12 DIAGNOSIS — N189 Chronic kidney disease, unspecified: Secondary | ICD-10-CM | POA: Diagnosis not present

## 2016-12-13 ENCOUNTER — Non-Acute Institutional Stay (SKILLED_NURSING_FACILITY): Payer: Medicare Other | Admitting: Gerontology

## 2016-12-13 DIAGNOSIS — N183 Chronic kidney disease, stage 3 unspecified: Secondary | ICD-10-CM

## 2016-12-13 LAB — BASIC METABOLIC PANEL
ANION GAP: 9 (ref 5–15)
BUN: 36 mg/dL — AB (ref 6–20)
CALCIUM: 8.6 mg/dL — AB (ref 8.9–10.3)
CO2: 30 mmol/L (ref 22–32)
CREATININE: 3.22 mg/dL — AB (ref 0.44–1.00)
Chloride: 96 mmol/L — ABNORMAL LOW (ref 101–111)
GFR calc Af Amer: 15 mL/min — ABNORMAL LOW (ref >60)
GFR, EST NON AFRICAN AMERICAN: 13 mL/min — AB (ref >60)
Glucose, Bld: 100 mg/dL — ABNORMAL HIGH (ref 65–99)
Potassium: 5.8 mmol/L — ABNORMAL HIGH (ref 3.5–5.1)
Sodium: 135 mmol/L (ref 135–145)

## 2016-12-13 LAB — CBC WITH DIFFERENTIAL/PLATELET
BASOS ABS: 0 10*3/uL (ref 0–0.1)
Basophils Relative: 1 %
EOS PCT: 3 %
Eosinophils Absolute: 0.1 10*3/uL (ref 0–0.7)
HCT: 32.7 % — ABNORMAL LOW (ref 35.0–47.0)
Hemoglobin: 11 g/dL — ABNORMAL LOW (ref 12.0–16.0)
LYMPHS PCT: 9 %
Lymphs Abs: 0.5 10*3/uL — ABNORMAL LOW (ref 1.0–3.6)
MCH: 30.6 pg (ref 26.0–34.0)
MCHC: 33.5 g/dL (ref 32.0–36.0)
MCV: 91.4 fL (ref 80.0–100.0)
MONO ABS: 0.5 10*3/uL (ref 0.2–0.9)
Monocytes Relative: 9 %
Neutro Abs: 4 10*3/uL (ref 1.4–6.5)
Neutrophils Relative %: 78 %
PLATELETS: 113 10*3/uL — AB (ref 150–440)
RBC: 3.58 MIL/uL — ABNORMAL LOW (ref 3.80–5.20)
RDW: 19.7 % — AB (ref 11.5–14.5)
WBC: 5.1 10*3/uL (ref 3.6–11.0)

## 2016-12-16 LAB — CBC
HCT: 35.4 % (ref 35.0–47.0)
Hemoglobin: 11.8 g/dL — ABNORMAL LOW (ref 12.0–16.0)
MCH: 31.4 pg (ref 26.0–34.0)
MCHC: 33.4 g/dL (ref 32.0–36.0)
MCV: 94.1 fL (ref 80.0–100.0)
PLATELETS: 106 10*3/uL — AB (ref 150–440)
RBC: 3.76 MIL/uL — ABNORMAL LOW (ref 3.80–5.20)
RDW: 20.4 % — AB (ref 11.5–14.5)
WBC: 4.6 10*3/uL (ref 3.6–11.0)

## 2016-12-16 LAB — BASIC METABOLIC PANEL
ANION GAP: 5 (ref 5–15)
BUN: 30 mg/dL — ABNORMAL HIGH (ref 6–20)
CALCIUM: 8.7 mg/dL — AB (ref 8.9–10.3)
CO2: 29 mmol/L (ref 22–32)
CREATININE: 3.38 mg/dL — AB (ref 0.44–1.00)
Chloride: 103 mmol/L (ref 101–111)
GFR, EST AFRICAN AMERICAN: 14 mL/min — AB (ref >60)
GFR, EST NON AFRICAN AMERICAN: 12 mL/min — AB (ref >60)
GLUCOSE: 99 mg/dL (ref 65–99)
Potassium: 6 mmol/L — ABNORMAL HIGH (ref 3.5–5.1)
Sodium: 137 mmol/L (ref 135–145)

## 2016-12-17 LAB — POTASSIUM: Potassium: 6.2 mmol/L — ABNORMAL HIGH (ref 3.5–5.1)

## 2016-12-18 ENCOUNTER — Other Ambulatory Visit
Admission: RE | Admit: 2016-12-18 | Discharge: 2016-12-18 | Disposition: A | Discharge status: Home / Self Care | Payer: Medicare Other | Source: Ambulatory Visit | Attending: Internal Medicine | Admitting: Internal Medicine

## 2016-12-18 LAB — CBC
HCT: 34 % — ABNORMAL LOW (ref 35.0–47.0)
HEMOGLOBIN: 11.4 g/dL — AB (ref 12.0–16.0)
MCH: 30.9 pg (ref 26.0–34.0)
MCHC: 33.4 g/dL (ref 32.0–36.0)
MCV: 92.5 fL (ref 80.0–100.0)
PLATELETS: 97 10*3/uL — AB (ref 150–440)
RBC: 3.68 MIL/uL — AB (ref 3.80–5.20)
RDW: 20.4 % — ABNORMAL HIGH (ref 11.5–14.5)
WBC: 4.4 10*3/uL (ref 3.6–11.0)

## 2016-12-18 LAB — BASIC METABOLIC PANEL
ANION GAP: 9 (ref 5–15)
BUN: 32 mg/dL — ABNORMAL HIGH (ref 6–20)
CHLORIDE: 99 mmol/L — AB (ref 101–111)
CO2: 28 mmol/L (ref 22–32)
Calcium: 8.8 mg/dL — ABNORMAL LOW (ref 8.9–10.3)
Creatinine, Ser: 3.84 mg/dL — ABNORMAL HIGH (ref 0.44–1.00)
GFR calc Af Amer: 12 mL/min — ABNORMAL LOW (ref >60)
GFR, EST NON AFRICAN AMERICAN: 10 mL/min — AB (ref >60)
GLUCOSE: 92 mg/dL (ref 65–99)
POTASSIUM: 5.7 mmol/L — AB (ref 3.5–5.1)
Sodium: 136 mmol/L (ref 135–145)

## 2016-12-19 ENCOUNTER — Non-Acute Institutional Stay (SKILLED_NURSING_FACILITY): Payer: Medicare Other | Admitting: Gerontology

## 2016-12-19 DIAGNOSIS — E875 Hyperkalemia: Secondary | ICD-10-CM | POA: Diagnosis not present

## 2016-12-19 DIAGNOSIS — I959 Hypotension, unspecified: Secondary | ICD-10-CM

## 2016-12-20 LAB — CBC WITH DIFFERENTIAL/PLATELET
Basophils Absolute: 0 10*3/uL (ref 0–0.1)
Basophils Relative: 1 %
EOS PCT: 3 %
Eosinophils Absolute: 0.1 10*3/uL (ref 0–0.7)
HCT: 32.3 % — ABNORMAL LOW (ref 35.0–47.0)
HEMOGLOBIN: 11 g/dL — AB (ref 12.0–16.0)
LYMPHS ABS: 0.4 10*3/uL — AB (ref 1.0–3.6)
Lymphocytes Relative: 10 %
MCH: 31.4 pg (ref 26.0–34.0)
MCHC: 33.9 g/dL (ref 32.0–36.0)
MCV: 92.6 fL (ref 80.0–100.0)
MONOS PCT: 11 %
Monocytes Absolute: 0.5 10*3/uL (ref 0.2–0.9)
Neutro Abs: 3.1 10*3/uL (ref 1.4–6.5)
Neutrophils Relative %: 75 %
Platelets: 88 10*3/uL — ABNORMAL LOW (ref 150–440)
RBC: 3.49 MIL/uL — AB (ref 3.80–5.20)
RDW: 20.5 % — ABNORMAL HIGH (ref 11.5–14.5)
WBC: 4.1 10*3/uL (ref 3.6–11.0)

## 2016-12-20 LAB — BASIC METABOLIC PANEL
Anion gap: 10 (ref 5–15)
BUN: 35 mg/dL — AB (ref 6–20)
CHLORIDE: 101 mmol/L (ref 101–111)
CO2: 27 mmol/L (ref 22–32)
Calcium: 8.7 mg/dL — ABNORMAL LOW (ref 8.9–10.3)
Creatinine, Ser: 3.57 mg/dL — ABNORMAL HIGH (ref 0.44–1.00)
GFR calc Af Amer: 13 mL/min — ABNORMAL LOW (ref >60)
GFR calc non Af Amer: 11 mL/min — ABNORMAL LOW (ref >60)
GLUCOSE: 96 mg/dL (ref 65–99)
POTASSIUM: 5.3 mmol/L — AB (ref 3.5–5.1)
Sodium: 138 mmol/L (ref 135–145)

## 2016-12-22 DIAGNOSIS — E875 Hyperkalemia: Secondary | ICD-10-CM | POA: Insufficient documentation

## 2016-12-22 DIAGNOSIS — I959 Hypotension, unspecified: Secondary | ICD-10-CM | POA: Insufficient documentation

## 2016-12-22 NOTE — Progress Notes (Signed)
Location:      Place of Service:  SNF (31) Provider:  Toni Arthurs, NP-C  Kirk Ruths., MD  Patient Care Team: Kirk Ruths, MD as PCP - General (Internal Medicine) Cammie Sickle, MD as Consulting Physician (Hematology and Oncology)  Extended Emergency Contact Information Primary Emergency Contact: Joaquim Lai Address: [address] [place], [place] [address] Johnnette Litter of [place] Phone: [phone] Mobile Phone: [phone] Relation: Sister Secondary Emergency Contact: Thompson,Allen Address: [address]          [place], [place] [address] [place] Phone: [phone] Work Phone: [phone] Mobile Phone: [phone] Relation: Son  Code Status:  full Goals of care: Advanced Directive information Advanced Directives 12/02/2016  Does Patient Have a Medical Advance Directive? No  Type of Advance Directive -  Does patient want to make changes to medical advance directive? -  Copy of [address] in Chart? -  Would patient like information on creating a medical advance directive? No - Patient declined     Chief Complaint  Patient presents with  . Acute Visit    HPI:  Pt is a 81 y.o. female seen today for an acute visit for hyperkalemia and hypotension. Pt was given kayexalate yesterday, but was unable to tolerate more than a few sips. Pt remains mildly hyperkalemic. Renal values elevated. Pt is having significantly less BLE edema than even a few days ago. Pt reports she is still feeling weak. She verbalizes she is hopeful she will improve and be able to return home. However, she says she is also realistic and realizes this may not be an option. Pt denies n/v/d/f/c/cp/sob/ha/abd pain/dizziness. VSS No other complaints.    Past Medical History:  Diagnosis Date  . Abnormal LFTs (liver function tests)   . Adenomatous polyps 03/05/2012  . Angiodysplasia   . Aortic stenosis   . Arthritis   . AV malformation  of GI tract   . Bilateral cellulitis of lower leg   . CAD (coronary artery disease)   . Cardiomyopathy ([place])   . Carotid bruit   . Cervical cancer ([place])    cervical cancer history  . CHF (congestive heart failure) ([place])   . Chronic edema   . Clostridium difficile infection March 02, 2015  . Complication of anesthesia     I have trouble waking up "  . COPD (chronic obstructive pulmonary disease) ([place])   . Diabetes mellitus with stage 3 chronic kidney disease ([place])   . Fatty liver   . Fibrocystic breast changes   . GERD (gastroesophageal reflux disease)   . GI bleed   . Gout   . History of blood transfusion   . History of nicotine use   . Hyperlipidemia   . Hypertension   . Iron deficiency anemia 2008  . Irritable bowel syndrome   . Macrocytosis   . Osteoarthritis   . Peptic ulcer disease   . PVD (peripheral vascular disease) ([place])   . Tobacco abuse   . Trigeminal neuralgia   . Vascular malformation 06/14/1997   Past Surgical History:  Procedure Laterality Date  . ABDOMINAL HYSTERECTOMY    . angiogram cardiac stenting    . BONE MARROW BIOPSY  1999  . CARDIAC CATHETERIZATION N/A 12/02/2016   Procedure: Left Heart Cath and Coronary Angiography;  Surgeon: Wellington Hampshire, MD;  Location: [facility];  Service: Cardiovascular;  Laterality: N/A;  . CARDIAC CATHETERIZATION N/A  12/02/2016   Procedure: Coronary Balloon Angioplasty;  Surgeon: Wellington Hampshire, MD;  Location: [facility];  Service: Cardiovascular;  Laterality: N/A;  . CHOLECYSTECTOMY    . COLONOSCOPY  12/2014, 2014, 2013, 2008, 2004   03/05/2012-Adenomatous Polyps;09/23/2003-Polyp remains intact;   . COLONOSCOPY N/A 07/12/2016   Procedure: COLONOSCOPY;  Surgeon: Ronald Lobo, MD;  Location: [facility] ENDOSCOPY;  Service: Endoscopy;  Laterality: N/A;  . COLONOSCOPY N/A 08/23/2016   Procedure: COLONOSCOPY;  Surgeon: Lucilla Lame, MD;  Location: [facility] ENDOSCOPY;  Service: Endoscopy;  Laterality: N/A;  .  Endoscopic Carpal Tunnel Release    . ESOPHAGOGASTRODUODENOSCOPY  12/2014, 2014, 2013, 2009, 2004  . ESOPHAGOGASTRODUODENOSCOPY (EGD) WITH PROPOFOL N/A 07/12/2016   Procedure: ESOPHAGOGASTRODUODENOSCOPY (EGD) WITH PROPOFOL;  Surgeon: Ronald Lobo, MD;  Location: [facility];  Service: Endoscopy;  Laterality: N/A;  . ESOPHAGOGASTRODUODENOSCOPY (EGD) WITH PROPOFOL N/A 08/23/2016   Procedure: ESOPHAGOGASTRODUODENOSCOPY (EGD) WITH PROPOFOL;  Surgeon: Lucilla Lame, MD;  Location: [facility] ENDOSCOPY;  Service: Endoscopy;  Laterality: N/A;  . HOT HEMOSTASIS N/A 07/12/2016   Procedure: HOT HEMOSTASIS (ARGON PLASMA COAGULATION/BICAP);  Surgeon: Ronald Lobo, MD;  Location: [facility] ENDOSCOPY;  Service: Endoscopy;  Laterality: N/A;  . NASAL SINUS SURGERY    . TEE WITHOUT CARDIOVERSION N/A 12/19/2015   Procedure: TRANSESOPHAGEAL ECHOCARDIOGRAM (TEE);  Surgeon: Yolonda Kida, MD;  Location: [facility] ORS;  Service: Cardiovascular;  Laterality: N/A;  . THORACENTESIS    . TONSILLECTOMY      Allergies  Allergen Reactions  . Aspirin Other (See Comments)    bleeding  . Atenolol Other (See Comments)    fatigue  . Clopidogrel Bisulfate Other (See Comments)    bleeding  . Darvon [Propoxyphene] Hives and Other (See Comments)  . Hydrochlorothiazide Other (See Comments)    Fatigue and cramps  . Iodinated Diagnostic Agents Other (See Comments)    Questionable GI bleed  . Nylon Hives  . Other Other (See Comments)    Nylon sutures  . Pravastatin Other (See Comments)    Severe cramping    Allergies as of 12/19/2016      Reactions   Aspirin Other (See Comments)   bleeding   Atenolol Other (See Comments)   fatigue   Clopidogrel Bisulfate Other (See Comments)   bleeding   Darvon [propoxyphene] Hives, Other (See Comments)   Hydrochlorothiazide Other (See Comments)   Fatigue and cramps   Iodinated Diagnostic Agents Other (See Comments)   Questionable GI bleed   Nylon Hives   Other Other (See Comments)   Nylon  sutures   Pravastatin Other (See Comments)   Severe cramping      Medication List       Accurate as of 12/19/16 11:59 PM. Always use your most recent med list.          acetaminophen 500 MG tablet Commonly known as:  TYLENOL Take 500 mg by mouth at bedtime.   amiodarone 200 MG tablet Commonly known as:  PACERONE Take 1 tablet (200 mg total) by mouth 2 (two) times daily.   amLODipine 2.5 MG tablet Commonly known as:  NORVASC Take 1 tablet (2.5 mg total) by mouth daily.   aspirin 81 MG chewable tablet Chew 1 tablet (81 mg total) by mouth daily.   atorvastatin 40 MG tablet Commonly known as:  LIPITOR Take 1 tablet (40 mg total) by mouth daily at 6 PM.   clopidogrel 75 MG tablet Commonly known as:  PLAVIX Take 1 tablet (75 mg total) by mouth daily with breakfast.  diclofenac sodium 1 % Gel Commonly known as:  VOLTAREN Apply 2 g topically 4 (four) times daily as needed. For pain   lisinopril 40 MG tablet Commonly known as:  PRINIVIL,ZESTRIL Take 40 mg by mouth daily.   metolazone 10 MG tablet Commonly known as:  ZAROXOLYN Take 10 mg by mouth daily as needed. For fluid/swelling   metoprolol succinate 50 MG 24 hr tablet Commonly known as:  TOPROL-XL Take 50 mg by mouth daily.   pantoprazole 40 MG tablet Commonly known as:  PROTONIX Take 40 mg by mouth 2 (two) times daily.   potassium chloride 10 MEQ tablet Commonly known as:  K-DUR Take 10 mEq by mouth 2 (two) times daily.   spironolactone 25 MG tablet Commonly known as:  ALDACTONE Take 25 mg by mouth daily.   torsemide 100 MG tablet Commonly known as:  DEMADEX Take 100 mg by mouth daily.       Review of Systems  Constitutional: Negative for activity change, appetite change, chills, diaphoresis and fever.  HENT: Negative for congestion, sneezing, sore throat, trouble swallowing and voice change.   Eyes: Negative.   Respiratory: Positive for shortness of breath. Negative for apnea, cough, choking,  chest tightness and wheezing.   Cardiovascular: Negative for chest pain, palpitations and leg swelling.  Gastrointestinal: Negative for abdominal distention, abdominal pain, constipation, diarrhea and nausea.  Genitourinary: Negative for difficulty urinating, dysuria, frequency and urgency.  Musculoskeletal: Negative for back pain, gait problem and myalgias. Arthralgias: typical arthritis.  Skin: Negative for color change, pallor, rash and wound.  Neurological: Positive for dizziness and weakness. Negative for tremors, syncope, speech difficulty, numbness and headaches.  All other systems reviewed and are negative.    There is no immunization history on file for this patient. Pertinent  Health Maintenance Due  Topic Date Due  . FOOT EXAM  11/13/1945  . OPHTHALMOLOGY EXAM  11/13/1945  . DEXA SCAN  11/12/2002  . PNA vac Low Risk Adult (1 of 2 - PCV13) 11/12/2002  . INFLUENZA VACCINE  07/23/2016  . HEMOGLOBIN A1C  06/02/2017   Fall Risk  10/03/2016  Falls in the past year? Yes  Number falls in past yr: 1  Injury with Fall? No  Risk for fall due to : History of fall(s);Impaired balance/gait;Impaired mobility  Follow up Falls evaluation completed;Education provided;Falls prevention discussed   Functional Status Survey:    Vitals:   12/19/16 0400  BP: (!) 90/44  Pulse: 66  Resp: 18  Temp: 97.4 F (36.3 C)  SpO2: 98%  Weight: 153 lb 6.4 oz (69.6 kg)   Body mass index is 25.53 kg/m. Physical Exam  Constitutional: She is oriented to person, place, and time. Vital signs are normal. She appears well-developed and well-nourished. She is active and cooperative. She does not appear ill. No distress.  HENT:  Head: Normocephalic and atraumatic.  Mouth/Throat: Uvula is midline, oropharynx is clear and moist and mucous membranes are normal. Mucous membranes are not pale, not dry and not cyanotic.  Eyes: Conjunctivae, EOM and lids are normal. Pupils are equal, round, and reactive to  light.  Neck: Trachea normal, normal range of motion and full passive range of motion without pain. Neck supple. No JVD present. No tracheal deviation, no edema and no erythema present. No thyromegaly present.  Cardiovascular: Regular rhythm, intact distal pulses and normal pulses.  Bradycardia present.  Exam reveals no gallop, no distant heart sounds and no friction rub.   Murmur heard. Pulmonary/Chest: Effort normal. No accessory  muscle usage. No respiratory distress. She has decreased breath sounds in the right lower field and the left lower field. She has no wheezes. She has no rhonchi. She has no rales. She exhibits no tenderness.  Abdominal: Soft. Normal appearance and bowel sounds are normal. She exhibits no distension and no ascites. There is no tenderness.  Musculoskeletal: Normal range of motion. She exhibits no edema or tenderness.  Expected osteoarthritis, stiffness  Neurological: She is alert and oriented to person, place, and time. She has normal strength.  Skin: Skin is warm, dry and intact. She is not diaphoretic. No cyanosis. No pallor. Nails show no clubbing.  Psychiatric: She has a normal mood and affect. Her speech is normal and behavior is normal. Judgment and thought content normal. Cognition and memory are normal.  Nursing note and vitals reviewed.   Labs reviewed:  Recent Labs  12/03/16 1950  12/05/16 0611 12/06/16 0500  12/16/16 0600 12/17/16 0620 12/18/16 0435 12/20/16 0830  NA  --   < > 138 138  < > 137  --  136 138  K 4.0  < > 4.0 4.4  < > 6.0* 6.2* 5.7* 5.3*  CL  --   < > 104 105  < > 103  --  99* 101  CO2  --   < > 27 28  < > 29  --  28 27  GLUCOSE  --   < > 125* 135*  < > 99  --  92 96  BUN  --   < > 33* 31*  < > 30*  --  32* 35*  CREATININE  --   < > 2.69* 2.34*  < > 3.38*  --  3.84* 3.57*  CALCIUM  --   < > 8.1* 8.5*  < > 8.7*  --  8.8* 8.7*  MG 2.9*  --  2.3 2.2  --   --   --   --   --   < > = values in this interval not displayed.  Recent Labs   07/09/16 1101 08/22/16 1312 09/26/16 1001  AST 24 24 18   ALT 17 13* 13*  ALKPHOS 79 82 125  BILITOT 1.0 0.7 0.9  PROT 6.5 6.0* 6.9  ALBUMIN 3.4* 2.7* 2.9*    Recent Labs  11/28/16 1315  12/13/16 0535 12/16/16 0600 12/18/16 0435 12/20/16 0830  WBC 7.2  < > 5.1 4.6 4.4 4.1  NEUTROABS 6.0  --  4.0  --   --  3.1  HGB 11.4*  < > 11.0* 11.8* 11.4* 11.0*  HCT 33.9*  < > 32.7* 35.4 34.0* 32.3*  MCV 89.4  < > 91.4 94.1 92.5 92.6  PLT 122*  < > 113* 106* 97* 88*  < > = values in this interval not displayed. Lab Results  Component Value Date   TSH 3.549 12/02/2016   Lab Results  Component Value Date   HGBA1C 6.3 (H) 12/02/2016   Lab Results  Component Value Date   CHOL 108 12/03/2016   HDL 52 12/03/2016   LDLCALC 40 12/03/2016   TRIG 82 12/03/2016   CHOLHDL 2.1 12/03/2016    Significant Diagnostic Results in last 30 days:  No results found.  Assessment/Plan 1. Hypotension, unspecified hypotension type  DC all antihypertensives  DC all diuretics  IVF  Fall precaustions  2. Hyperkalemia  Restart IV fluids 0.9% NS at 50 mL/ hr x 72 hours  Recheck labs on moday  Family/ staff Communication:   Total Time:  Documentation:  Face to Face:  Family/Phone:   Labs/tests ordered:  Cbc, met b  Medication list reviewed and assessed for continued appropriateness.  Vikki Ports, NP-C Geriatrics [facility] [phone], [place] [address] Cell Phone (Mon-Fri 8am-5pm):  [phone] On Call:  [phone] & follow prompts after 5pm & weekends Office Phone:  [phone] Office Fax:  [phone]

## 2016-12-22 NOTE — Progress Notes (Signed)
Location:      Place of Service:  SNF (31) Provider:  Toni Arthurs, NP-C  Kirk Ruths., MD  Patient Care Team: Kirk Ruths, MD as PCP - General (Internal Medicine) Cammie Sickle, MD as Consulting Physician (Hematology and Oncology)  Extended Emergency Contact Information Primary Emergency Contact: Joaquim Lai Address: [address] [place], [place] [address] [place] Phone: [phone] Mobile Phone: [phone] Relation: Sister Secondary Emergency Contact: Thompson,Allen Address: [address]          [place], [place] [address] [place] Phone: [phone] Work Phone: [phone] Mobile Phone: [phone] Relation: Son  Code Status:  full Goals of care: Advanced Directive information Advanced Directives 12/02/2016  Does Patient Have a Medical Advance Directive? No  Type of Advance Directive -  Does patient want to make changes to medical advance directive? -  Copy of [place] in Chart? -  Would patient like information on creating a medical advance directive? No - Patient declined     Chief Complaint  Patient presents with  . Acute Visit    HPI:  Pt is a 81 y.o. female seen today for an acute visit for Chronic Kidney Diease, stage III. Pt was admitted to the facility for rehab following STEMI. She has been having increased weakness and shortness of breath. She is having difficulty walking to the BR in the room without dizziness and shortness of breath. Pt is on a very large dose of diuretics per home regimen. Pt is showing much less edema in BLE. Will reduce diuretics and infuse fluids to increase hydration status. Pt is voiding. No reports of changes in urine characteristics. Pt denies n/v/d/f/c/cp/sob/ha/abd pain/dizziness. Pt reports she is feeling weak, but generally ok. VSS. No other complaints.    Past Medical History:  Diagnosis Date  . Abnormal LFTs (liver function tests)   .  Adenomatous polyps 03/05/2012  . Angiodysplasia   . Aortic stenosis   . Arthritis   . AV malformation of GI tract   . Bilateral cellulitis of lower leg   . CAD (coronary artery disease)   . Cardiomyopathy ([place])   . Carotid bruit   . Cervical cancer ([place])    cervical cancer history  . CHF (congestive heart failure) ([place])   . Chronic edema   . Clostridium difficile infection March 02, 2015  . Complication of anesthesia     I have trouble waking up "  . COPD (chronic obstructive pulmonary disease) ([place])   . Diabetes mellitus with stage 3 chronic kidney disease ([place])   . Fatty liver   . Fibrocystic breast changes   . GERD (gastroesophageal reflux disease)   . GI bleed   . Gout   . History of blood transfusion   . History of nicotine use   . Hyperlipidemia   . Hypertension   . Iron deficiency anemia 2008  . Irritable bowel syndrome   . Macrocytosis   . Osteoarthritis   . Peptic ulcer disease   . PVD (peripheral vascular disease) ([place])   . Tobacco abuse   . Trigeminal neuralgia   . Vascular malformation 06/14/1997   Past Surgical History:  Procedure Laterality Date  . ABDOMINAL HYSTERECTOMY    . angiogram cardiac stenting    . BONE MARROW BIOPSY  1999  . CARDIAC CATHETERIZATION N/A 12/02/2016   Procedure: Left Heart Cath and Coronary Angiography;  Surgeon: Wellington Hampshire, MD;  Location: [facility];  Service: Cardiovascular;  Laterality: N/A;  . CARDIAC CATHETERIZATION N/A 12/02/2016   Procedure: Coronary Balloon Angioplasty;  Surgeon: Wellington Hampshire, MD;  Location: [facility];  Service: Cardiovascular;  Laterality: N/A;  . CHOLECYSTECTOMY    . COLONOSCOPY  12/2014, 2014, 2013, 2008, 2004   03/05/2012-Adenomatous Polyps;09/23/2003-Polyp remains intact;   . COLONOSCOPY N/A 07/12/2016   Procedure: COLONOSCOPY;  Surgeon: Ronald Lobo, MD;  Location: [facility] ENDOSCOPY;  Service: Endoscopy;  Laterality: N/A;  . COLONOSCOPY N/A 08/23/2016   Procedure:  COLONOSCOPY;  Surgeon: Lucilla Lame, MD;  Location: [facility] ENDOSCOPY;  Service: Endoscopy;  Laterality: N/A;  . Endoscopic Carpal Tunnel Release    . ESOPHAGOGASTRODUODENOSCOPY  12/2014, 2014, 2013, 2009, 2004  . ESOPHAGOGASTRODUODENOSCOPY (EGD) WITH PROPOFOL N/A 07/12/2016   Procedure: ESOPHAGOGASTRODUODENOSCOPY (EGD) WITH PROPOFOL;  Surgeon: Ronald Lobo, MD;  Location: [facility];  Service: Endoscopy;  Laterality: N/A;  . ESOPHAGOGASTRODUODENOSCOPY (EGD) WITH PROPOFOL N/A 08/23/2016   Procedure: ESOPHAGOGASTRODUODENOSCOPY (EGD) WITH PROPOFOL;  Surgeon: Lucilla Lame, MD;  Location: [facility] ENDOSCOPY;  Service: Endoscopy;  Laterality: N/A;  . HOT HEMOSTASIS N/A 07/12/2016   Procedure: HOT HEMOSTASIS (ARGON PLASMA COAGULATION/BICAP);  Surgeon: Ronald Lobo, MD;  Location: [facility] END[facility]COPY;  Service: Endoscopy;  Laterality: N/A;  . NASAL SINUS SURGERY    . TEE WITHOUT CARDIOVERSION N/A 12/19/2015   Procedure: TRANSESOPHAGEAL ECHOCARDIOGRAM (TEE);  Surgeon: Yolonda Kida, MD;  Location: [facility] ORS;  Service: Cardiovascular;  Laterality: N/A;  . THORACENTESIS    . TONSILLECTOMY      Allergies  Allergen Reactions  . Aspirin Other (See Comments)    bleeding  . Atenolol Other (See Comments)    fatigue  . Clopidogrel Bisulfate Other (See Comments)    bleeding  . Darvon [Propoxyphene] Hives and Other (See Comments)  . Hydrochlorothiazide Other (See Comments)    Fatigue and cramps  . Iodinated Diagnostic Agents Other (See Comments)    Questionable GI bleed  . Nylon Hives  . Other Other (See Comments)    Nylon sutures  . Pravastatin Other (See Comments)    Severe cramping    Allergies as of 12/13/2016      Reactions   Aspirin Other (See Comments)   bleeding   Atenolol Other (See Comments)   fatigue   Clopidogrel Bisulfate Other (See Comments)   bleeding   Darvon [propoxyphene] Hives, Other (See Comments)   Hydrochlorothiazide Other (See Comments)   Fatigue and cramps   Iodinated  Diagnostic Agents Other (See Comments)   Questionable GI bleed   Nylon Hives   Other Other (See Comments)   Nylon sutures   Pravastatin Other (See Comments)   Severe cramping      Medication List       Accurate as of 12/13/16 11:59 PM. Always use your most recent med list.          acetaminophen 500 MG tablet Commonly known as:  TYLENOL Take 500 mg by mouth at bedtime.   amiodarone 200 MG tablet Commonly known as:  PACERONE Take 1 tablet (200 mg total) by mouth 2 (two) times daily.   amLODipine 2.5 MG tablet Commonly known as:  NORVASC Take 1 tablet (2.5 mg total) by mouth daily.   aspirin 81 MG chewable tablet Chew 1 tablet (81 mg total) by mouth daily.   atorvastatin 40 MG tablet Commonly known as:  LIPITOR Take 1 tablet (40 mg total) by mouth daily at 6 PM.   clopidogrel 75 MG tablet Commonly  known as:  PLAVIX Take 1 tablet (75 mg total) by mouth daily with breakfast.   diclofenac sodium 1 % Gel Commonly known as:  VOLTAREN Apply 2 g topically 4 (four) times daily as needed. For pain   lisinopril 40 MG tablet Commonly known as:  PRINIVIL,ZESTRIL Take 40 mg by mouth daily.   metolazone 10 MG tablet Commonly known as:  ZAROXOLYN Take 10 mg by mouth daily as needed. For fluid/swelling   metoprolol succinate 50 MG 24 hr tablet Commonly known as:  TOPROL-XL Take 50 mg by mouth daily.   pantoprazole 40 MG tablet Commonly known as:  PROTONIX Take 40 mg by mouth 2 (two) times daily.   potassium chloride 10 MEQ tablet Commonly known as:  K-DUR Take 10 mEq by mouth 2 (two) times daily.   spironolactone 25 MG tablet Commonly known as:  ALDACTONE Take 25 mg by mouth daily.   torsemide 100 MG tablet Commonly known as:  DEMADEX Take 100 mg by mouth daily.       Review of Systems  Constitutional: Negative for activity change, appetite change, chills, diaphoresis and fever.  HENT: Negative for congestion, sneezing, sore throat, trouble swallowing and  voice change.   Eyes: Negative.   Respiratory: Positive for shortness of breath. Negative for apnea, cough, choking, chest tightness and wheezing.   Cardiovascular: Negative for chest pain, palpitations and leg swelling.  Gastrointestinal: Negative for abdominal distention, abdominal pain, constipation, diarrhea and nausea.  Genitourinary: Negative for difficulty urinating, dysuria, frequency and urgency.  Musculoskeletal: Negative for back pain, gait problem and myalgias. Arthralgias: typical arthritis.  Skin: Negative for color change, pallor, rash and wound.  Neurological: Positive for dizziness and weakness. Negative for tremors, syncope, speech difficulty, numbness and headaches.  All other systems reviewed and are negative.    There is no immunization history on file for this patient. Pertinent  Health Maintenance Due  Topic Date Due  . FOOT EXAM  11/13/1945  . OPHTHALMOLOGY EXAM  11/13/1945  . DEXA SCAN  11/12/2002  . PNA vac Low Risk Adult (1 of 2 - PCV13) 11/12/2002  . INFLUENZA VACCINE  07/23/2016  . HEMOGLOBIN A1C  06/02/2017   Fall Risk  10/03/2016  Falls in the past year? Yes  Number falls in past yr: 1  Injury with Fall? No  Risk for fall due to : History of fall(s);Impaired balance/gait;Impaired mobility  Follow up Falls evaluation completed;Education provided;Falls prevention discussed   Functional Status Survey:    Vitals:   12/13/16 0530  BP: (!) 138/54  Pulse: (!) 59  Resp: 18  Temp: 97.9 F (36.6 C)  SpO2: 96%  Weight: 162 lb 3.4 oz (73.6 kg)   Body mass index is 26.99 kg/m. Physical Exam  Constitutional: She is oriented to person, place, and time. Vital signs are normal. She appears well-developed and well-nourished. She is active and cooperative. She does not appear ill. No distress.  HENT:  Head: Normocephalic and atraumatic.  Mouth/Throat: Uvula is midline, oropharynx is clear and moist and mucous membranes are normal. Mucous membranes are not  pale, not dry and not cyanotic.  Eyes: Conjunctivae, EOM and lids are normal. Pupils are equal, round, and reactive to light.  Neck: Trachea normal, normal range of motion and full passive range of motion without pain. Neck supple. No JVD present. No tracheal deviation, no edema and no erythema present. No thyromegaly present.  Cardiovascular: Regular rhythm, intact distal pulses and normal pulses.  Bradycardia present.  Exam reveals no  gallop, no distant heart sounds and no friction rub.   Murmur heard. Pulmonary/Chest: Effort normal. No accessory muscle usage. No respiratory distress. She has decreased breath sounds in the right lower field and the left lower field. She has no wheezes. She has no rhonchi. She has no rales. She exhibits no tenderness.  Abdominal: Soft. Normal appearance and bowel sounds are normal. She exhibits no distension and no ascites. There is no tenderness.  Musculoskeletal: Normal range of motion. She exhibits no edema or tenderness.  Expected osteoarthritis, stiffness  Neurological: She is alert and oriented to person, place, and time. She has normal strength.  Skin: Skin is warm, dry and intact. She is not diaphoretic. No cyanosis. No pallor. Nails show no clubbing.  Psychiatric: She has a normal mood and affect. Her speech is normal and behavior is normal. Judgment and thought content normal. Cognition and memory are normal.  Nursing note and vitals reviewed.   Labs reviewed:  Recent Labs  12/03/16 1950  12/05/16 0611 12/06/16 0500  12/16/16 0600 12/17/16 0620 12/18/16 0435 12/20/16 0830  NA  --   < > 138 138  < > 137  --  136 138  K 4.0  < > 4.0 4.4  < > 6.0* 6.2* 5.7* 5.3*  CL  --   < > 104 105  < > 103  --  99* 101  CO2  --   < > 27 28  < > 29  --  28 27  GLUCOSE  --   < > 125* 135*  < > 99  --  92 96  BUN  --   < > 33* 31*  < > 30*  --  32* 35*  CREATININE  --   < > 2.69* 2.34*  < > 3.38*  --  3.84* 3.57*  CALCIUM  --   < > 8.1* 8.5*  < > 8.7*  --   8.8* 8.7*  MG 2.9*  --  2.3 2.2  --   --   --   --   --   < > = values in this interval not displayed.  Recent Labs  07/09/16 1101 08/22/16 1312 09/26/16 1001  AST 24 24 18   ALT 17 13* 13*  ALKPHOS 79 82 125  BILITOT 1.0 0.7 0.9  PROT 6.5 6.0* 6.9  ALBUMIN 3.4* 2.7* 2.9*    Recent Labs  11/28/16 1315  12/13/16 0535 12/16/16 0600 12/18/16 0435 12/20/16 0830  WBC 7.2  < > 5.1 4.6 4.4 4.1  NEUTROABS 6.0  --  4.0  --   --  3.1  HGB 11.4*  < > 11.0* 11.8* 11.4* 11.0*  HCT 33.9*  < > 32.7* 35.4 34.0* 32.3*  MCV 89.4  < > 91.4 94.1 92.5 92.6  PLT 122*  < > 113* 106* 97* 88*  < > = values in this interval not displayed. Lab Results  Component Value Date   TSH 3.549 12/02/2016   Lab Results  Component Value Date   HGBA1C 6.3 (H) 12/02/2016   Lab Results  Component Value Date   CHOL 108 12/03/2016   HDL 52 12/03/2016   LDLCALC 40 12/03/2016   TRIG 82 12/03/2016   CHOLHDL 2.1 12/03/2016    Significant Diagnostic Results in last 30 days:  No results found.  Assessment/Plan 1. Chronic kidney disease (CKD), stage III (moderate)  Insert and maintain peripheral IV for infusion of IVF for hydration  0.9% NS- 250 mL bolus, then continuous at  50 mL/ hr x 48 hours for hydration  DC nephrotoxic medications  DC antihypertensive medications  Family/ staff Communication:   Total Time:  Documentation:  Face to Face:  Family/Phone:   Labs/tests ordered:  Cbc, met B, cxr  Medication list reviewed and assessed for continued appropriateness.  Vikki Ports, NP-C Geriatrics [facility] [phone] [place], [place] [address] Cell Phone (Mon-Fri 8am-5pm):  [phone] On Call:  [phone] & follow prompts after 5pm & weekends Office Phone:  [phone] Office Fax:  [phone]

## 2016-12-23 ENCOUNTER — Encounter
Admission: RE | Admit: 2016-12-23 | Discharge: 2016-12-23 | Disposition: A | Discharge status: Home / Self Care | Payer: Medicare Other | Source: Ambulatory Visit | Attending: Internal Medicine | Admitting: Internal Medicine

## 2016-12-23 LAB — CBC WITH DIFFERENTIAL/PLATELET
Basophils Absolute: 0 10*3/uL (ref 0–0.1)
Basophils Relative: 1 %
EOS ABS: 0.1 10*3/uL (ref 0–0.7)
Eosinophils Relative: 3 %
HCT: 31 % — ABNORMAL LOW (ref 35.0–47.0)
Hemoglobin: 10.2 g/dL — ABNORMAL LOW (ref 12.0–16.0)
LYMPHS ABS: 0.4 10*3/uL — AB (ref 1.0–3.6)
Lymphocytes Relative: 10 %
MCH: 30.8 pg (ref 26.0–34.0)
MCHC: 32.8 g/dL (ref 32.0–36.0)
MCV: 94 fL (ref 80.0–100.0)
MONOS PCT: 11 %
Monocytes Absolute: 0.4 10*3/uL (ref 0.2–0.9)
Neutro Abs: 2.7 10*3/uL (ref 1.4–6.5)
Neutrophils Relative %: 75 %
Platelets: 85 10*3/uL — ABNORMAL LOW (ref 150–440)
RBC: 3.29 MIL/uL — AB (ref 3.80–5.20)
RDW: 20.5 % — ABNORMAL HIGH (ref 11.5–14.5)
WBC: 3.6 10*3/uL (ref 3.6–11.0)

## 2016-12-23 LAB — BASIC METABOLIC PANEL
Anion gap: 4 — ABNORMAL LOW (ref 5–15)
BUN: 26 mg/dL — AB (ref 6–20)
CO2: 23 mmol/L (ref 22–32)
CREATININE: 2.45 mg/dL — AB (ref 0.44–1.00)
Calcium: 9 mg/dL (ref 8.9–10.3)
Chloride: 109 mmol/L (ref 101–111)
GFR calc Af Amer: 20 mL/min — ABNORMAL LOW (ref >60)
GFR, EST NON AFRICAN AMERICAN: 18 mL/min — AB (ref >60)
Glucose, Bld: 99 mg/dL (ref 65–99)
Potassium: 6 mmol/L — ABNORMAL HIGH (ref 3.5–5.1)
SODIUM: 136 mmol/L (ref 135–145)

## 2016-12-24 ENCOUNTER — Inpatient Hospital Stay: Payer: Medicare Other | Attending: Internal Medicine

## 2016-12-24 DIAGNOSIS — Z955 Presence of coronary angioplasty implant and graft: Secondary | ICD-10-CM | POA: Diagnosis not present

## 2016-12-24 DIAGNOSIS — N183 Chronic kidney disease, stage 3 (moderate): Secondary | ICD-10-CM | POA: Insufficient documentation

## 2016-12-24 DIAGNOSIS — D631 Anemia in chronic kidney disease: Secondary | ICD-10-CM | POA: Insufficient documentation

## 2016-12-24 DIAGNOSIS — M1990 Unspecified osteoarthritis, unspecified site: Secondary | ICD-10-CM | POA: Diagnosis not present

## 2016-12-24 DIAGNOSIS — Z79899 Other long term (current) drug therapy: Secondary | ICD-10-CM | POA: Diagnosis not present

## 2016-12-24 DIAGNOSIS — I429 Cardiomyopathy, unspecified: Secondary | ICD-10-CM | POA: Diagnosis not present

## 2016-12-24 DIAGNOSIS — J449 Chronic obstructive pulmonary disease, unspecified: Secondary | ICD-10-CM | POA: Insufficient documentation

## 2016-12-24 DIAGNOSIS — Z87891 Personal history of nicotine dependence: Secondary | ICD-10-CM | POA: Diagnosis not present

## 2016-12-24 DIAGNOSIS — I739 Peripheral vascular disease, unspecified: Secondary | ICD-10-CM | POA: Insufficient documentation

## 2016-12-24 DIAGNOSIS — K219 Gastro-esophageal reflux disease without esophagitis: Secondary | ICD-10-CM | POA: Insufficient documentation

## 2016-12-24 DIAGNOSIS — E785 Hyperlipidemia, unspecified: Secondary | ICD-10-CM | POA: Diagnosis not present

## 2016-12-24 DIAGNOSIS — I350 Nonrheumatic aortic (valve) stenosis: Secondary | ICD-10-CM | POA: Diagnosis not present

## 2016-12-24 DIAGNOSIS — I130 Hypertensive heart and chronic kidney disease with heart failure and stage 1 through stage 4 chronic kidney disease, or unspecified chronic kidney disease: Secondary | ICD-10-CM | POA: Insufficient documentation

## 2016-12-24 DIAGNOSIS — Z8541 Personal history of malignant neoplasm of cervix uteri: Secondary | ICD-10-CM | POA: Diagnosis not present

## 2016-12-24 DIAGNOSIS — I2510 Atherosclerotic heart disease of native coronary artery without angina pectoris: Secondary | ICD-10-CM | POA: Insufficient documentation

## 2016-12-24 DIAGNOSIS — K589 Irritable bowel syndrome without diarrhea: Secondary | ICD-10-CM | POA: Diagnosis not present

## 2016-12-24 DIAGNOSIS — L03116 Cellulitis of left lower limb: Secondary | ICD-10-CM | POA: Insufficient documentation

## 2016-12-24 DIAGNOSIS — E1122 Type 2 diabetes mellitus with diabetic chronic kidney disease: Secondary | ICD-10-CM | POA: Diagnosis not present

## 2016-12-24 DIAGNOSIS — M109 Gout, unspecified: Secondary | ICD-10-CM | POA: Insufficient documentation

## 2016-12-25 LAB — CBC WITH DIFFERENTIAL/PLATELET
Basophils Absolute: 0 10*3/uL (ref 0–0.1)
Basophils Relative: 1 %
EOS ABS: 0.1 10*3/uL (ref 0–0.7)
EOS PCT: 2 %
HCT: 29.9 % — ABNORMAL LOW (ref 35.0–47.0)
Hemoglobin: 10.1 g/dL — ABNORMAL LOW (ref 12.0–16.0)
LYMPHS ABS: 0.3 10*3/uL — AB (ref 1.0–3.6)
LYMPHS PCT: 10 %
MCH: 31.5 pg (ref 26.0–34.0)
MCHC: 33.7 g/dL (ref 32.0–36.0)
MCV: 93.6 fL (ref 80.0–100.0)
MONOS PCT: 10 %
Monocytes Absolute: 0.3 10*3/uL (ref 0.2–0.9)
Neutro Abs: 2.6 10*3/uL (ref 1.4–6.5)
Neutrophils Relative %: 77 %
PLATELETS: 80 10*3/uL — AB (ref 150–440)
RBC: 3.2 MIL/uL — ABNORMAL LOW (ref 3.80–5.20)
RDW: 20.7 % — ABNORMAL HIGH (ref 11.5–14.5)
WBC: 3.4 10*3/uL — ABNORMAL LOW (ref 3.6–11.0)

## 2016-12-25 LAB — BASIC METABOLIC PANEL
Anion gap: 5 (ref 5–15)
BUN: 21 mg/dL — AB (ref 6–20)
CHLORIDE: 110 mmol/L (ref 101–111)
CO2: 24 mmol/L (ref 22–32)
CREATININE: 2.04 mg/dL — AB (ref 0.44–1.00)
Calcium: 8.8 mg/dL — ABNORMAL LOW (ref 8.9–10.3)
GFR calc Af Amer: 26 mL/min — ABNORMAL LOW (ref >60)
GFR calc non Af Amer: 22 mL/min — ABNORMAL LOW (ref >60)
GLUCOSE: 103 mg/dL — AB (ref 65–99)
POTASSIUM: 5 mmol/L (ref 3.5–5.1)
SODIUM: 139 mmol/L (ref 135–145)

## 2016-12-26 ENCOUNTER — Ambulatory Visit: Payer: Medicare Other | Admitting: Internal Medicine

## 2016-12-26 ENCOUNTER — Ambulatory Visit: Payer: Medicare Other

## 2016-12-27 ENCOUNTER — Other Ambulatory Visit
Admission: RE | Admit: 2016-12-27 | Discharge: 2016-12-27 | Disposition: A | Discharge status: Home / Self Care | Payer: Medicare Other | Source: Skilled Nursing Facility | Attending: Internal Medicine | Admitting: Internal Medicine

## 2016-12-27 ENCOUNTER — Non-Acute Institutional Stay (SKILLED_NURSING_FACILITY): Payer: Medicare Other | Admitting: Gerontology

## 2016-12-27 ENCOUNTER — Inpatient Hospital Stay: Payer: Medicare Other

## 2016-12-27 ENCOUNTER — Inpatient Hospital Stay: Payer: Medicare Other | Admitting: Internal Medicine

## 2016-12-27 DIAGNOSIS — I959 Hypotension, unspecified: Secondary | ICD-10-CM | POA: Diagnosis not present

## 2016-12-27 DIAGNOSIS — I2510 Atherosclerotic heart disease of native coronary artery without angina pectoris: Secondary | ICD-10-CM | POA: Diagnosis not present

## 2016-12-27 DIAGNOSIS — D649 Anemia, unspecified: Secondary | ICD-10-CM | POA: Insufficient documentation

## 2016-12-27 DIAGNOSIS — I2119 ST elevation (STEMI) myocardial infarction involving other coronary artery of inferior wall: Secondary | ICD-10-CM | POA: Diagnosis not present

## 2016-12-27 DIAGNOSIS — I2111 ST elevation (STEMI) myocardial infarction involving right coronary artery: Secondary | ICD-10-CM | POA: Diagnosis not present

## 2016-12-27 LAB — CBC WITH DIFFERENTIAL/PLATELET
Basophils Absolute: 0 10*3/uL (ref 0–0.1)
Basophils Relative: 1 %
EOS PCT: 3 %
Eosinophils Absolute: 0.1 10*3/uL (ref 0–0.7)
HCT: 29.7 % — ABNORMAL LOW (ref 35.0–47.0)
HEMOGLOBIN: 9.9 g/dL — AB (ref 12.0–16.0)
LYMPHS ABS: 0.4 10*3/uL — AB (ref 1.0–3.6)
LYMPHS PCT: 12 %
MCH: 31.5 pg (ref 26.0–34.0)
MCHC: 33.1 g/dL (ref 32.0–36.0)
MCV: 95 fL (ref 80.0–100.0)
Monocytes Absolute: 0.4 10*3/uL (ref 0.2–0.9)
Monocytes Relative: 11 %
Neutro Abs: 2.4 10*3/uL (ref 1.4–6.5)
Neutrophils Relative %: 73 %
PLATELETS: 92 10*3/uL — AB (ref 150–440)
RBC: 3.13 MIL/uL — AB (ref 3.80–5.20)
RDW: 21.3 % — ABNORMAL HIGH (ref 11.5–14.5)
WBC: 3.3 10*3/uL — ABNORMAL LOW (ref 3.6–11.0)

## 2016-12-27 LAB — BASIC METABOLIC PANEL
Anion gap: 6 (ref 5–15)
BUN: 20 mg/dL (ref 6–20)
CHLORIDE: 108 mmol/L (ref 101–111)
CO2: 23 mmol/L (ref 22–32)
Calcium: 8.9 mg/dL (ref 8.9–10.3)
Creatinine, Ser: 2.08 mg/dL — ABNORMAL HIGH (ref 0.44–1.00)
GFR calc Af Amer: 25 mL/min — ABNORMAL LOW (ref >60)
GFR calc non Af Amer: 22 mL/min — ABNORMAL LOW (ref >60)
GLUCOSE: 109 mg/dL — AB (ref 65–99)
POTASSIUM: 5.7 mmol/L — AB (ref 3.5–5.1)
Sodium: 137 mmol/L (ref 135–145)

## 2016-12-28 NOTE — Progress Notes (Signed)
Location:      Place of Service:  SNF (31)  Provider: Toni Arthurs, NP-C  PCP: Kirk Ruths., MD Patient Care Team: Kirk Ruths, MD as PCP - General (Internal Medicine) Cammie Sickle, MD as Consulting Physician (Hematology and Oncology)  Extended Emergency Contact Information Primary Emergency Contact: Joaquim Lai Address: [place], [place] [address] [place] of [place] Phone: [phone] Mobile Phone: [phone] Relation: Sister Secondary Emergency Contact: Thompson,Allen Address: [address]          [place],  [address] [place] Phone: [phone] Work Phone: [phone] Mobile Phone: [phone] Relation: Son  Code Status: full Goals of care:  Advanced Directive information Advanced Directives 12/02/2016  Does Patient Have a Medical Advance Directive? No  Type of Advance Directive -  Does patient want to make changes to medical advance directive? -  Copy of [place] in Chart? -  Would patient like information on creating a medical advance directive? No - Patient declined     Allergies  Allergen Reactions  . Aspirin Other (See Comments)    bleeding  . Atenolol Other (See Comments)    fatigue  . Clopidogrel Bisulfate Other (See Comments)    bleeding  . Darvon [Propoxyphene] Hives and Other (See Comments)  . Hydrochlorothiazide Other (See Comments)    Fatigue and cramps  . Iodinated Diagnostic Agents Other (See Comments)    Questionable GI bleed  . Nylon Hives  . Other Other (See Comments)    Nylon sutures  . Pravastatin Other (See Comments)    Severe cramping    Chief Complaint  Patient presents with  . Discharge Note    HPI:  82 y.o. female seen today for discharge evaluation. Pt was admitted to rehab following hospitalization for STEMI. Pt underwent PT/OT for strengthening. While here, she was seen by Palliative Care. Pt had mild hyperkalemia, was given  Kayexelate- with resolution of hyperkalemia. Potassium continued to trend upwards. Pt unable to ambulate long distances without dyspnea. Pt is realistic about her limitations. She verbalizes that she knows she is not going to get better. Her goal was to get home if possible. Pt strength has improved to the point she is able to go home with [facility] PT/OT/SN. She will receive Palliative Care services at home. She is able to convert to [facility] when she is ready. Pt is pleasant. She is very excited to be going home tomorrow. VSS. No other complaints.     Past Medical History:  Diagnosis Date  . Abnormal LFTs (liver function tests)   . Adenomatous polyps 03/05/2012  . Angiodysplasia   . Aortic stenosis   . Arthritis   . AV malformation of GI tract   . Bilateral cellulitis of lower leg   . CAD (coronary artery disease)   . Cardiomyopathy ([place])   . Carotid bruit   . Cervical cancer ([place])    cervical cancer history  . CHF (congestive heart failure) ([place])   . Chronic edema   . Clostridium difficile infection March 02, 2015  . Complication of anesthesia     I have trouble waking up "  . COPD (chronic obstructive pulmonary disease) ([place])   . Diabetes mellitus with stage 3 chronic kidney disease ([place])   . Fatty liver   . Fibrocystic breast changes   . GERD (gastroesophageal reflux disease)   . GI bleed   . Gout   .  History of blood transfusion   . History of nicotine use   . Hyperlipidemia   . Hypertension   . Iron deficiency anemia 2008  . Irritable bowel syndrome   . Macrocytosis   . Osteoarthritis   . Peptic ulcer disease   . PVD (peripheral vascular disease) ([place])   . Tobacco abuse   . Trigeminal neuralgia   . Vascular malformation 06/14/1997    Past Surgical History:  Procedure Laterality Date  . ABDOMINAL HYSTERECTOMY    . angiogram cardiac stenting    . BONE MARROW BIOPSY  1999  . CARDIAC CATHETERIZATION N/A 12/02/2016   Procedure: Left Heart Cath and Coronary  Angiography;  Surgeon: Wellington Hampshire, MD;  Location: [facility];  Service: Cardiovascular;  Laterality: N/A;  . CARDIAC CATHETERIZATION N/A 12/02/2016   Procedure: Coronary Balloon Angioplasty;  Surgeon: Wellington Hampshire, MD;  Location: [facility];  Service: Cardiovascular;  Laterality: N/A;  . CHOLECYSTECTOMY    . COLONOSCOPY  12/2014, 2014, 2013, 2008, 2004   03/05/2012-Adenomatous Polyps;09/23/2003-Polyp remains intact;   . COLONOSCOPY N/A 07/12/2016   Procedure: COLONOSCOPY;  Surgeon: Ronald Lobo, MD;  Location: [facility] ENDOSCOPY;  Service: Endoscopy;  Laterality: N/A;  . COLONOSCOPY N/A 08/23/2016   Procedure: COLONOSCOPY;  Surgeon: Lucilla Lame, MD;  Location: [facility] ENDOSCOPY;  Service: Endoscopy;  Laterality: N/A;  . Endoscopic Carpal Tunnel Release    . ESOPHAGOGASTRODUODENOSCOPY  12/2014, 2014, 2013, 2009, 2004  . ESOPHAGOGASTRODUODENOSCOPY (EGD) WITH PROPOFOL N/A 07/12/2016   Procedure: ESOPHAGOGASTRODUODENOSCOPY (EGD) WITH PROPOFOL;  Surgeon: Ronald Lobo, MD;  Location: [place];  Service: Endoscopy;  Laterality: N/A;  . ESOPHAGOGASTRODUODENOSCOPY (EGD) WITH PROPOFOL N/A 08/23/2016   Procedure: ESOPHAGOGASTRODUODENOSCOPY (EGD) WITH PROPOFOL;  Surgeon: Lucilla Lame, MD;  Location: [facility] ENDOSCOPY;  Service: Endoscopy;  Laterality: N/A;  . HOT HEMOSTASIS N/A 07/12/2016   Procedure: HOT HEMOSTASIS (ARGON PLASMA COAGULATION/BICAP);  Surgeon: Ronald Lobo, MD;  Location: [facility] ENDOSCOPY;  Service: Endoscopy;  Laterality: N/A;  . NASAL SINUS SURGERY    . TEE WITHOUT CARDIOVERSION N/A 12/19/2015   Procedure: TRANSESOPHAGEAL ECHOCARDIOGRAM (TEE);  Surgeon: Yolonda Kida, MD;  Location: [facility] ORS;  Service: Cardiovascular;  Laterality: N/A;  . THORACENTESIS    . TONSILLECTOMY        reports that she quit smoking about 3 years ago. Her smoking use included Cigarettes. She has a 28.00 pack-year smoking history. She has never used smokeless tobacco. She reports that she does  not drink alcohol or use drugs. Social History   Social History  . Marital status: Married    Spouse name: N/A  . Number of children: N/A  . Years of education: N/A   Occupational History  . Not on file.   Social History Main Topics  . Smoking status: Former Smoker    Packs/day: 0.50    Years: 56.00    Types: Cigarettes    Quit date: 05/23/2013  . Smokeless tobacco: Never Used  . Alcohol use No  . Drug use: No  . Sexual activity: No   Other Topics Concern  . Not on file   Social History Narrative  . No narrative on file   Functional Status Survey:    Allergies  Allergen Reactions  . Aspirin Other (See Comments)    bleeding  . Atenolol Other (See Comments)    fatigue  . Clopidogrel Bisulfate Other (See Comments)    bleeding  . Darvon [Propoxyphene] Hives and Other (See Comments)  . Hydrochlorothiazide Other (See Comments)  Fatigue and cramps  . Iodinated Diagnostic Agents Other (See Comments)    Questionable GI bleed  . Nylon Hives  . Other Other (See Comments)    Nylon sutures  . Pravastatin Other (See Comments)    Severe cramping    Pertinent  Health Maintenance Due  Topic Date Due  . FOOT EXAM  11/13/1945  . OPHTHALMOLOGY EXAM  11/13/1945  . DEXA SCAN  11/12/2002  . PNA vac Low Risk Adult (1 of 2 - PCV13) 11/12/2002  . INFLUENZA VACCINE  07/23/2016  . HEMOGLOBIN A1C  06/02/2017    Medications: Allergies as of 12/27/2016      Reactions   Aspirin Other (See Comments)   bleeding   Atenolol Other (See Comments)   fatigue   Clopidogrel Bisulfate Other (See Comments)   bleeding   Darvon [propoxyphene] Hives, Other (See Comments)   Hydrochlorothiazide Other (See Comments)   Fatigue and cramps   Iodinated Diagnostic Agents Other (See Comments)   Questionable GI bleed   Nylon Hives   Other Other (See Comments)   Nylon sutures   Pravastatin Other (See Comments)   Severe cramping      Medication List       Accurate as of 12/27/16 11:59 PM.  Always use your most recent med list.          acetaminophen 500 MG tablet Commonly known as:  TYLENOL Take 500 mg by mouth at bedtime.   amiodarone 200 MG tablet Commonly known as:  PACERONE Take 1 tablet (200 mg total) by mouth 2 (two) times daily.   amLODipine 2.5 MG tablet Commonly known as:  NORVASC Take 1 tablet (2.5 mg total) by mouth daily.   aspirin 81 MG chewable tablet Chew 1 tablet (81 mg total) by mouth daily.   atorvastatin 40 MG tablet Commonly known as:  LIPITOR Take 1 tablet (40 mg total) by mouth daily at 6 PM.   clopidogrel 75 MG tablet Commonly known as:  PLAVIX Take 1 tablet (75 mg total) by mouth daily with breakfast.   diclofenac sodium 1 % Gel Commonly known as:  VOLTAREN Apply 2 g topically 4 (four) times daily as needed. For pain   lisinopril 40 MG tablet Commonly known as:  PRINIVIL,ZESTRIL Take 40 mg by mouth daily.   metolazone 10 MG tablet Commonly known as:  ZAROXOLYN Take 10 mg by mouth daily as needed. For fluid/swelling   metoprolol succinate 50 MG 24 hr tablet Commonly known as:  TOPROL-XL Take 50 mg by mouth daily.   pantoprazole 40 MG tablet Commonly known as:  PROTONIX Take 40 mg by mouth 2 (two) times daily.   potassium chloride 10 MEQ tablet Commonly known as:  K-DUR Take 10 mEq by mouth 2 (two) times daily.   spironolactone 25 MG tablet Commonly known as:  ALDACTONE Take 25 mg by mouth daily.   torsemide 100 MG tablet Commonly known as:  DEMADEX Take 100 mg by mouth daily.       Review of Systems  Constitutional: Negative for activity change, appetite change, chills, diaphoresis and fever.  HENT: Negative for congestion, sneezing, sore throat, trouble swallowing and voice change.   Eyes: Negative.   Respiratory: Positive for shortness of breath. Negative for apnea, cough, choking, chest tightness and wheezing.   Cardiovascular: Negative for chest pain, palpitations and leg swelling.  Gastrointestinal:  Negative for abdominal distention, abdominal pain, constipation, diarrhea and nausea.  Genitourinary: Negative for difficulty urinating, dysuria, frequency and urgency.  Musculoskeletal:  Negative for back pain, gait problem and myalgias. Arthralgias: typical arthritis.  Skin: Negative for color change, pallor, rash and wound.  Neurological: Positive for weakness. Negative for dizziness, tremors, syncope, speech difficulty, numbness and headaches.  All other systems reviewed and are negative.   Vitals:   12/27/16 0600  BP: (!) 158/67  Pulse: 87  Resp: 18  Temp: 98.6 F (37 C)  SpO2: 98%   There is no height or weight on file to calculate BMI. Physical Exam  Constitutional: She is oriented to person, place, and time. Vital signs are normal. She appears well-developed and well-nourished. She is active and cooperative. She does not appear ill. No distress.  HENT:  Head: Normocephalic and atraumatic.  Mouth/Throat: Uvula is midline, oropharynx is clear and moist and mucous membranes are normal. Mucous membranes are not pale, not dry and not cyanotic.  Eyes: Conjunctivae, EOM and lids are normal. Pupils are equal, round, and reactive to light.  Neck: Trachea normal, normal range of motion and full passive range of motion without pain. Neck supple. No JVD present. No tracheal deviation, no edema and no erythema present. No thyromegaly present.  Cardiovascular: Regular rhythm, intact distal pulses and normal pulses.  Bradycardia present.  Exam reveals no gallop, no distant heart sounds and no friction rub.   Murmur heard. Pulmonary/Chest: Effort normal. No accessory muscle usage. No respiratory distress. She has decreased breath sounds in the right lower field and the left lower field. She has no wheezes. She has no rhonchi. She has no rales. She exhibits no tenderness.  Abdominal: Soft. Normal appearance and bowel sounds are normal. She exhibits no distension and no ascites. There is no  tenderness.  Musculoskeletal: Normal range of motion. She exhibits no edema or tenderness.  Expected osteoarthritis, stiffness  Neurological: She is alert and oriented to person, place, and time. She has normal strength.  Skin: Skin is warm, dry and intact. She is not diaphoretic. No cyanosis. No pallor. Nails show no clubbing.  Psychiatric: She has a normal mood and affect. Her speech is normal and behavior is normal. Judgment and thought content normal. Cognition and memory are normal.  Nursing note and vitals reviewed.   Labs reviewed: Basic Metabolic Panel:  Recent Labs  12/03/16 1950  12/05/16 0611 12/06/16 0500  12/23/16 0710 12/25/16 0600 12/27/16 0415  NA  --   < > 138 138  < > 136 139 137  K 4.0  < > 4.0 4.4  < > 6.0* 5.0 5.7*  CL  --   < > 104 105  < > 109 110 108  CO2  --   < > 27 28  < > 23 24 23   GLUCOSE  --   < > 125* 135*  < > 99 103* 109*  BUN  --   < > 33* 31*  < > 26* 21* 20  CREATININE  --   < > 2.69* 2.34*  < > 2.45* 2.04* 2.08*  CALCIUM  --   < > 8.1* 8.5*  < > 9.0 8.8* 8.9  MG 2.9*  --  2.3 2.2  --   --   --   --   < > = values in this interval not displayed. Liver Function Tests:  Recent Labs  07/09/16 1101 08/22/16 1312 09/26/16 1001  AST 24 24 18   ALT 17 13* 13*  ALKPHOS 79 82 125  BILITOT 1.0 0.7 0.9  PROT 6.5 6.0* 6.9  ALBUMIN 3.4* 2.7* 2.9*  No results for input(s): LIPASE, AMYLASE in the last 8760 hours. No results for input(s): AMMONIA in the last 8760 hours. CBC:  Recent Labs  12/23/16 0710 12/25/16 0600 12/27/16 0415  WBC 3.6 3.4* 3.3*  NEUTROABS 2.7 2.6 2.4  HGB 10.2* 10.1* 9.9*  HCT 31.0* 29.9* 29.7*  MCV 94.0 93.6 95.0  PLT 85* 80* 92*   Cardiac Enzymes:  Recent Labs  07/10/16 0555 07/10/16 1207 12/02/16 1153  TROPONINI 0.03* 0.09* 11.63*   BNP: Invalid input(s): POCBNP CBG:  Recent Labs  12/06/16 0724 12/06/16 1111 12/06/16 1621  GLUCAP 136* 161* 133*    Procedures and Imaging Studies During  Stay: No results found.  Assessment/Plan:   1. STEMI involving right coronary artery ([place]) 2. ST elevation myocardial infarction (STEMI) of inferior wall (HCC)  Continue ASA  Continue Plavix  Continue Metoprolol  HHPT/OT  Fu with PCP and Cardiologist asap after DC  Medication list updated and faxed to pharmacy  3. Hypotension, unspecified hypotension type  resolved   Patient is being discharged with the following home health services:  [facility] at home, HHPT/OT/SN  Patient is being discharged with the following durable medical equipment:  none  Patient has been advised to f/u with their PCP in 1-2 weeks to bring them up to date on their rehab stay.  Social services at facility was responsible for arranging this appointment.  Pt was provided with a 30 day supply of prescriptions for medications and refills must be obtained from their PCP.  For controlled substances, a more limited supply may be provided adequate until PCP appointment only.  Future labs/tests needed:  Per pcp  Family/ staff Communication:   Total Time:  Documentation:  Face to Face:  Family/Phone:  Vikki Ports, NP-C [facility] [address], [place] [address] Cell Phone (Mon-Fri 8am-5pm):  [phone] On Call:  [phone] & follow prompts after 5pm & weekends Office Phone:  [phone] Office Fax:  [phone]

## 2017-01-03 ENCOUNTER — Inpatient Hospital Stay: Payer: Medicare Other

## 2017-01-06 ENCOUNTER — Inpatient Hospital Stay: Payer: Medicare Other

## 2017-01-06 DIAGNOSIS — I130 Hypertensive heart and chronic kidney disease with heart failure and stage 1 through stage 4 chronic kidney disease, or unspecified chronic kidney disease: Secondary | ICD-10-CM | POA: Diagnosis not present

## 2017-01-06 DIAGNOSIS — D631 Anemia in chronic kidney disease: Secondary | ICD-10-CM

## 2017-01-06 DIAGNOSIS — N183 Chronic kidney disease, stage 3 (moderate): Principal | ICD-10-CM

## 2017-01-06 LAB — CBC WITH DIFFERENTIAL/PLATELET
BASOS ABS: 0.1 10*3/uL (ref 0–0.1)
Basophils Relative: 1 %
Eosinophils Absolute: 0.1 10*3/uL (ref 0–0.7)
Eosinophils Relative: 3 %
HEMATOCRIT: 34.1 % — AB (ref 35.0–47.0)
Hemoglobin: 11.5 g/dL — ABNORMAL LOW (ref 12.0–16.0)
LYMPHS ABS: 0.3 10*3/uL — AB (ref 1.0–3.6)
LYMPHS PCT: 7 %
MCH: 32.4 pg (ref 26.0–34.0)
MCHC: 33.8 g/dL (ref 32.0–36.0)
MCV: 95.9 fL (ref 80.0–100.0)
MONO ABS: 0.4 10*3/uL (ref 0.2–0.9)
Monocytes Relative: 8 %
Neutro Abs: 3.6 10*3/uL (ref 1.4–6.5)
Neutrophils Relative %: 81 %
Platelets: 119 10*3/uL — ABNORMAL LOW (ref 150–440)
RBC: 3.56 MIL/uL — ABNORMAL LOW (ref 3.80–5.20)
RDW: 20.5 % — AB (ref 11.5–14.5)
WBC: 4.4 10*3/uL (ref 3.6–11.0)

## 2017-01-06 LAB — SAMPLE TO BLOOD BANK

## 2017-01-06 LAB — IRON AND TIBC
Iron: 137 ug/dL (ref 28–170)
SATURATION RATIOS: 61 % — AB (ref 10.4–31.8)
TIBC: 226 ug/dL — AB (ref 250–450)
UIBC: 89 ug/dL

## 2017-01-06 LAB — FERRITIN: Ferritin: 420 ng/mL — ABNORMAL HIGH (ref 11–307)

## 2017-01-10 ENCOUNTER — Inpatient Hospital Stay: Payer: Medicare Other | Admitting: Internal Medicine

## 2017-01-10 ENCOUNTER — Inpatient Hospital Stay: Payer: Medicare Other

## 2017-01-10 ENCOUNTER — Telehealth: Payer: Self-pay | Admitting: *Deleted

## 2017-01-10 DIAGNOSIS — N183 Chronic kidney disease, stage 3 unspecified: Secondary | ICD-10-CM

## 2017-01-10 DIAGNOSIS — D500 Iron deficiency anemia secondary to blood loss (chronic): Secondary | ICD-10-CM

## 2017-01-10 DIAGNOSIS — D696 Thrombocytopenia, unspecified: Secondary | ICD-10-CM

## 2017-01-10 DIAGNOSIS — D631 Anemia in chronic kidney disease: Secondary | ICD-10-CM

## 2017-01-10 NOTE — Telephone Encounter (Signed)
Spoke with patient. No IV iron is needed. Pt can cnl her apt next week with Dr. Rogue Bussing and IV iron. New appointments to be given for monthly labs. Pt will be notified if her lab values drop so that IV iron could be given if it becomes necessary.  Pt will return in 3 months with lab/md/iv fereheme and labs a few days prior to md apt. Pt made aware and is agreeable to the plan of care. She states that she is feeling better and not as fatigued.  Labs from this week were personally reviewed with  patient.

## 2017-01-10 NOTE — Telephone Encounter (Signed)
Asking for results to be called to her

## 2017-01-17 ENCOUNTER — Ambulatory Visit: Payer: Medicare Other | Admitting: Internal Medicine

## 2017-01-17 ENCOUNTER — Ambulatory Visit: Payer: Medicare Other

## 2017-02-07 ENCOUNTER — Inpatient Hospital Stay: Payer: Medicare Other | Attending: Internal Medicine

## 2017-02-07 ENCOUNTER — Inpatient Hospital Stay: Payer: Medicare Other

## 2017-02-07 DIAGNOSIS — Z8541 Personal history of malignant neoplasm of cervix uteri: Secondary | ICD-10-CM | POA: Insufficient documentation

## 2017-02-07 DIAGNOSIS — E785 Hyperlipidemia, unspecified: Secondary | ICD-10-CM | POA: Diagnosis not present

## 2017-02-07 DIAGNOSIS — J449 Chronic obstructive pulmonary disease, unspecified: Secondary | ICD-10-CM | POA: Insufficient documentation

## 2017-02-07 DIAGNOSIS — I350 Nonrheumatic aortic (valve) stenosis: Secondary | ICD-10-CM | POA: Insufficient documentation

## 2017-02-07 DIAGNOSIS — K219 Gastro-esophageal reflux disease without esophagitis: Secondary | ICD-10-CM | POA: Insufficient documentation

## 2017-02-07 DIAGNOSIS — M109 Gout, unspecified: Secondary | ICD-10-CM | POA: Diagnosis not present

## 2017-02-07 DIAGNOSIS — K589 Irritable bowel syndrome without diarrhea: Secondary | ICD-10-CM | POA: Insufficient documentation

## 2017-02-07 DIAGNOSIS — D631 Anemia in chronic kidney disease: Secondary | ICD-10-CM | POA: Insufficient documentation

## 2017-02-07 DIAGNOSIS — I130 Hypertensive heart and chronic kidney disease with heart failure and stage 1 through stage 4 chronic kidney disease, or unspecified chronic kidney disease: Secondary | ICD-10-CM | POA: Insufficient documentation

## 2017-02-07 DIAGNOSIS — M1990 Unspecified osteoarthritis, unspecified site: Secondary | ICD-10-CM | POA: Insufficient documentation

## 2017-02-07 DIAGNOSIS — Z79899 Other long term (current) drug therapy: Secondary | ICD-10-CM | POA: Diagnosis not present

## 2017-02-07 DIAGNOSIS — I2510 Atherosclerotic heart disease of native coronary artery without angina pectoris: Secondary | ICD-10-CM | POA: Insufficient documentation

## 2017-02-07 DIAGNOSIS — Z955 Presence of coronary angioplasty implant and graft: Secondary | ICD-10-CM | POA: Insufficient documentation

## 2017-02-07 DIAGNOSIS — I739 Peripheral vascular disease, unspecified: Secondary | ICD-10-CM | POA: Diagnosis not present

## 2017-02-07 DIAGNOSIS — I429 Cardiomyopathy, unspecified: Secondary | ICD-10-CM | POA: Diagnosis not present

## 2017-02-07 DIAGNOSIS — Z95828 Presence of other vascular implants and grafts: Secondary | ICD-10-CM

## 2017-02-07 DIAGNOSIS — N183 Chronic kidney disease, stage 3 unspecified: Secondary | ICD-10-CM

## 2017-02-07 DIAGNOSIS — Z87891 Personal history of nicotine dependence: Secondary | ICD-10-CM | POA: Diagnosis not present

## 2017-02-07 DIAGNOSIS — L03116 Cellulitis of left lower limb: Secondary | ICD-10-CM | POA: Insufficient documentation

## 2017-02-07 DIAGNOSIS — E1122 Type 2 diabetes mellitus with diabetic chronic kidney disease: Secondary | ICD-10-CM | POA: Insufficient documentation

## 2017-02-07 DIAGNOSIS — D696 Thrombocytopenia, unspecified: Secondary | ICD-10-CM

## 2017-02-07 DIAGNOSIS — D500 Iron deficiency anemia secondary to blood loss (chronic): Secondary | ICD-10-CM

## 2017-02-07 LAB — CBC WITH DIFFERENTIAL/PLATELET
BASOS PCT: 1 %
Basophils Absolute: 0.1 10*3/uL (ref 0–0.1)
EOS ABS: 0.1 10*3/uL (ref 0–0.7)
Eosinophils Relative: 2 %
HEMATOCRIT: 28.2 % — AB (ref 35.0–47.0)
HEMOGLOBIN: 9.9 g/dL — AB (ref 12.0–16.0)
LYMPHS ABS: 0.3 10*3/uL — AB (ref 1.0–3.6)
Lymphocytes Relative: 7 %
MCH: 35.1 pg — AB (ref 26.0–34.0)
MCHC: 35.1 g/dL (ref 32.0–36.0)
MCV: 100.1 fL — ABNORMAL HIGH (ref 80.0–100.0)
MONO ABS: 0.4 10*3/uL (ref 0.2–0.9)
MONOS PCT: 10 %
NEUTROS PCT: 80 %
Neutro Abs: 3.4 10*3/uL (ref 1.4–6.5)
Platelets: 102 10*3/uL — ABNORMAL LOW (ref 150–440)
RBC: 2.81 MIL/uL — ABNORMAL LOW (ref 3.80–5.20)
RDW: 16.3 % — AB (ref 11.5–14.5)
WBC: 4.3 10*3/uL (ref 3.6–11.0)

## 2017-02-07 LAB — IRON AND TIBC
IRON: 106 ug/dL (ref 28–170)
Saturation Ratios: 47 % — ABNORMAL HIGH (ref 10.4–31.8)
TIBC: 228 ug/dL — ABNORMAL LOW (ref 250–450)
UIBC: 122 ug/dL

## 2017-02-07 LAB — BASIC METABOLIC PANEL
ANION GAP: 7 (ref 5–15)
BUN: 27 mg/dL — ABNORMAL HIGH (ref 6–20)
CHLORIDE: 98 mmol/L — AB (ref 101–111)
CO2: 27 mmol/L (ref 22–32)
Calcium: 8.4 mg/dL — ABNORMAL LOW (ref 8.9–10.3)
Creatinine, Ser: 2.04 mg/dL — ABNORMAL HIGH (ref 0.44–1.00)
GFR calc Af Amer: 26 mL/min — ABNORMAL LOW (ref >60)
GFR, EST NON AFRICAN AMERICAN: 22 mL/min — AB (ref >60)
Glucose, Bld: 166 mg/dL — ABNORMAL HIGH (ref 65–99)
POTASSIUM: 4.2 mmol/L (ref 3.5–5.1)
SODIUM: 132 mmol/L — AB (ref 135–145)

## 2017-02-07 LAB — FERRITIN: FERRITIN: 247 ng/mL (ref 11–307)

## 2017-02-07 LAB — SAMPLE TO BLOOD BANK

## 2017-02-07 MED ORDER — HEPARIN SOD (PORK) LOCK FLUSH 100 UNIT/ML IV SOLN
500.0000 [IU] | Freq: Once | INTRAVENOUS | Status: AC
  Administered 2017-02-07: 500 [IU] via INTRAVENOUS

## 2017-02-07 MED ORDER — SODIUM CHLORIDE 0.9% FLUSH
10.0000 mL | INTRAVENOUS | Status: DC | PRN
  Administered 2017-02-07: 10 mL via INTRAVENOUS
  Filled 2017-02-07: qty 10

## 2017-03-07 ENCOUNTER — Telehealth: Payer: Self-pay | Admitting: *Deleted

## 2017-03-07 ENCOUNTER — Inpatient Hospital Stay: Payer: Medicare Other | Attending: Internal Medicine

## 2017-03-07 DIAGNOSIS — E1122 Type 2 diabetes mellitus with diabetic chronic kidney disease: Secondary | ICD-10-CM | POA: Diagnosis not present

## 2017-03-07 DIAGNOSIS — N183 Chronic kidney disease, stage 3 unspecified: Secondary | ICD-10-CM

## 2017-03-07 DIAGNOSIS — K219 Gastro-esophageal reflux disease without esophagitis: Secondary | ICD-10-CM | POA: Diagnosis not present

## 2017-03-07 DIAGNOSIS — M1990 Unspecified osteoarthritis, unspecified site: Secondary | ICD-10-CM | POA: Diagnosis not present

## 2017-03-07 DIAGNOSIS — I350 Nonrheumatic aortic (valve) stenosis: Secondary | ICD-10-CM | POA: Diagnosis not present

## 2017-03-07 DIAGNOSIS — D696 Thrombocytopenia, unspecified: Secondary | ICD-10-CM

## 2017-03-07 DIAGNOSIS — L03116 Cellulitis of left lower limb: Secondary | ICD-10-CM | POA: Insufficient documentation

## 2017-03-07 DIAGNOSIS — I2510 Atherosclerotic heart disease of native coronary artery without angina pectoris: Secondary | ICD-10-CM | POA: Diagnosis not present

## 2017-03-07 DIAGNOSIS — M109 Gout, unspecified: Secondary | ICD-10-CM | POA: Diagnosis not present

## 2017-03-07 DIAGNOSIS — I130 Hypertensive heart and chronic kidney disease with heart failure and stage 1 through stage 4 chronic kidney disease, or unspecified chronic kidney disease: Secondary | ICD-10-CM | POA: Insufficient documentation

## 2017-03-07 DIAGNOSIS — J449 Chronic obstructive pulmonary disease, unspecified: Secondary | ICD-10-CM | POA: Diagnosis not present

## 2017-03-07 DIAGNOSIS — K589 Irritable bowel syndrome without diarrhea: Secondary | ICD-10-CM | POA: Diagnosis not present

## 2017-03-07 DIAGNOSIS — D500 Iron deficiency anemia secondary to blood loss (chronic): Secondary | ICD-10-CM

## 2017-03-07 DIAGNOSIS — I739 Peripheral vascular disease, unspecified: Secondary | ICD-10-CM | POA: Insufficient documentation

## 2017-03-07 DIAGNOSIS — E785 Hyperlipidemia, unspecified: Secondary | ICD-10-CM | POA: Insufficient documentation

## 2017-03-07 DIAGNOSIS — Z79899 Other long term (current) drug therapy: Secondary | ICD-10-CM | POA: Diagnosis not present

## 2017-03-07 DIAGNOSIS — Z955 Presence of coronary angioplasty implant and graft: Secondary | ICD-10-CM | POA: Insufficient documentation

## 2017-03-07 DIAGNOSIS — D631 Anemia in chronic kidney disease: Secondary | ICD-10-CM | POA: Diagnosis not present

## 2017-03-07 DIAGNOSIS — Z8541 Personal history of malignant neoplasm of cervix uteri: Secondary | ICD-10-CM | POA: Diagnosis not present

## 2017-03-07 LAB — CBC WITH DIFFERENTIAL/PLATELET
BASOS PCT: 1 %
Basophils Absolute: 0 10*3/uL (ref 0–0.1)
Eosinophils Absolute: 0.1 10*3/uL (ref 0–0.7)
Eosinophils Relative: 2 %
HCT: 26.8 % — ABNORMAL LOW (ref 35.0–47.0)
Hemoglobin: 9.3 g/dL — ABNORMAL LOW (ref 12.0–16.0)
Lymphocytes Relative: 9 %
Lymphs Abs: 0.4 10*3/uL — ABNORMAL LOW (ref 1.0–3.6)
MCH: 35.2 pg — AB (ref 26.0–34.0)
MCHC: 34.9 g/dL (ref 32.0–36.0)
MCV: 101.1 fL — ABNORMAL HIGH (ref 80.0–100.0)
MONO ABS: 0.3 10*3/uL (ref 0.2–0.9)
MONOS PCT: 7 %
Neutro Abs: 3.9 10*3/uL (ref 1.4–6.5)
Neutrophils Relative %: 81 %
Platelets: 98 10*3/uL — ABNORMAL LOW (ref 150–440)
RBC: 2.65 MIL/uL — ABNORMAL LOW (ref 3.80–5.20)
RDW: 13.3 % (ref 11.5–14.5)
WBC: 4.8 10*3/uL (ref 3.6–11.0)

## 2017-03-07 LAB — BASIC METABOLIC PANEL
ANION GAP: 7 (ref 5–15)
BUN: 59 mg/dL — AB (ref 6–20)
CHLORIDE: 98 mmol/L — AB (ref 101–111)
CO2: 30 mmol/L (ref 22–32)
Calcium: 9 mg/dL (ref 8.9–10.3)
Creatinine, Ser: 2.78 mg/dL — ABNORMAL HIGH (ref 0.44–1.00)
GFR calc Af Amer: 18 mL/min — ABNORMAL LOW (ref >60)
GFR, EST NON AFRICAN AMERICAN: 15 mL/min — AB (ref >60)
Glucose, Bld: 201 mg/dL — ABNORMAL HIGH (ref 65–99)
POTASSIUM: 4.6 mmol/L (ref 3.5–5.1)
SODIUM: 135 mmol/L (ref 135–145)

## 2017-03-07 LAB — IRON AND TIBC
IRON: 91 ug/dL (ref 28–170)
Saturation Ratios: 30 % (ref 10.4–31.8)
TIBC: 303 ug/dL (ref 250–450)
UIBC: 212 ug/dL

## 2017-03-07 LAB — FERRITIN: FERRITIN: 125 ng/mL (ref 11–307)

## 2017-03-07 NOTE — Telephone Encounter (Signed)
Per Dr Rogue Bussing, will watch for now as she does not want Procrit. Patient advised of this and stated "Alright, that is fine"

## 2017-03-07 NOTE — Telephone Encounter (Signed)
Called for lab results. Reports that she is having black tarry stools at times, not often. Her hgb has dropped, but her FE and Ferr is nml. Please advise

## 2017-03-24 ENCOUNTER — Inpatient Hospital Stay: Payer: Medicare Other | Attending: Internal Medicine

## 2017-03-24 ENCOUNTER — Encounter: Payer: Self-pay | Admitting: Emergency Medicine

## 2017-03-24 ENCOUNTER — Encounter: Payer: Self-pay | Admitting: *Deleted

## 2017-03-24 ENCOUNTER — Other Ambulatory Visit: Payer: Self-pay | Admitting: *Deleted

## 2017-03-24 ENCOUNTER — Telehealth: Payer: Self-pay | Admitting: *Deleted

## 2017-03-24 ENCOUNTER — Inpatient Hospital Stay
Admission: EM | Admit: 2017-03-24 | Discharge: 2017-03-26 | DRG: 812 | Disposition: A | Discharge status: Home Health | Payer: Medicare Other | Attending: Internal Medicine | Admitting: Internal Medicine

## 2017-03-24 DIAGNOSIS — K589 Irritable bowel syndrome without diarrhea: Secondary | ICD-10-CM | POA: Insufficient documentation

## 2017-03-24 DIAGNOSIS — J449 Chronic obstructive pulmonary disease, unspecified: Secondary | ICD-10-CM | POA: Insufficient documentation

## 2017-03-24 DIAGNOSIS — D62 Acute posthemorrhagic anemia: Secondary | ICD-10-CM | POA: Diagnosis present

## 2017-03-24 DIAGNOSIS — M109 Gout, unspecified: Secondary | ICD-10-CM | POA: Insufficient documentation

## 2017-03-24 DIAGNOSIS — I350 Nonrheumatic aortic (valve) stenosis: Secondary | ICD-10-CM | POA: Insufficient documentation

## 2017-03-24 DIAGNOSIS — Z7902 Long term (current) use of antithrombotics/antiplatelets: Secondary | ICD-10-CM

## 2017-03-24 DIAGNOSIS — Z79899 Other long term (current) drug therapy: Secondary | ICD-10-CM | POA: Diagnosis not present

## 2017-03-24 DIAGNOSIS — Z888 Allergy status to other drugs, medicaments and biological substances status: Secondary | ICD-10-CM | POA: Diagnosis not present

## 2017-03-24 DIAGNOSIS — Z801 Family history of malignant neoplasm of trachea, bronchus and lung: Secondary | ICD-10-CM | POA: Diagnosis not present

## 2017-03-24 DIAGNOSIS — E1151 Type 2 diabetes mellitus with diabetic peripheral angiopathy without gangrene: Secondary | ICD-10-CM | POA: Diagnosis present

## 2017-03-24 DIAGNOSIS — N183 Chronic kidney disease, stage 3 unspecified: Secondary | ICD-10-CM

## 2017-03-24 DIAGNOSIS — I2510 Atherosclerotic heart disease of native coronary artery without angina pectoris: Secondary | ICD-10-CM | POA: Diagnosis present

## 2017-03-24 DIAGNOSIS — Z8711 Personal history of peptic ulcer disease: Secondary | ICD-10-CM

## 2017-03-24 DIAGNOSIS — Z833 Family history of diabetes mellitus: Secondary | ICD-10-CM

## 2017-03-24 DIAGNOSIS — E785 Hyperlipidemia, unspecified: Secondary | ICD-10-CM | POA: Diagnosis present

## 2017-03-24 DIAGNOSIS — Z8249 Family history of ischemic heart disease and other diseases of the circulatory system: Secondary | ICD-10-CM | POA: Diagnosis not present

## 2017-03-24 DIAGNOSIS — K640 First degree hemorrhoids: Secondary | ICD-10-CM | POA: Diagnosis present

## 2017-03-24 DIAGNOSIS — K219 Gastro-esophageal reflux disease without esophagitis: Secondary | ICD-10-CM | POA: Diagnosis present

## 2017-03-24 DIAGNOSIS — K2970 Gastritis, unspecified, without bleeding: Secondary | ICD-10-CM | POA: Diagnosis present

## 2017-03-24 DIAGNOSIS — Z885 Allergy status to narcotic agent status: Secondary | ICD-10-CM

## 2017-03-24 DIAGNOSIS — Z820 Family history of epilepsy and other diseases of the nervous system: Secondary | ICD-10-CM

## 2017-03-24 DIAGNOSIS — I739 Peripheral vascular disease, unspecified: Secondary | ICD-10-CM | POA: Insufficient documentation

## 2017-03-24 DIAGNOSIS — R195 Other fecal abnormalities: Secondary | ICD-10-CM | POA: Diagnosis present

## 2017-03-24 DIAGNOSIS — L03116 Cellulitis of left lower limb: Secondary | ICD-10-CM | POA: Insufficient documentation

## 2017-03-24 DIAGNOSIS — N179 Acute kidney failure, unspecified: Secondary | ICD-10-CM | POA: Diagnosis present

## 2017-03-24 DIAGNOSIS — Z7189 Other specified counseling: Secondary | ICD-10-CM

## 2017-03-24 DIAGNOSIS — N184 Chronic kidney disease, stage 4 (severe): Secondary | ICD-10-CM | POA: Diagnosis present

## 2017-03-24 DIAGNOSIS — I429 Cardiomyopathy, unspecified: Secondary | ICD-10-CM | POA: Diagnosis present

## 2017-03-24 DIAGNOSIS — I509 Heart failure, unspecified: Secondary | ICD-10-CM | POA: Diagnosis present

## 2017-03-24 DIAGNOSIS — E1122 Type 2 diabetes mellitus with diabetic chronic kidney disease: Secondary | ICD-10-CM | POA: Diagnosis present

## 2017-03-24 DIAGNOSIS — K5730 Diverticulosis of large intestine without perforation or abscess without bleeding: Secondary | ICD-10-CM | POA: Diagnosis present

## 2017-03-24 DIAGNOSIS — Z90710 Acquired absence of both cervix and uterus: Secondary | ICD-10-CM

## 2017-03-24 DIAGNOSIS — Z7982 Long term (current) use of aspirin: Secondary | ICD-10-CM

## 2017-03-24 DIAGNOSIS — I130 Hypertensive heart and chronic kidney disease with heart failure and stage 1 through stage 4 chronic kidney disease, or unspecified chronic kidney disease: Secondary | ICD-10-CM | POA: Insufficient documentation

## 2017-03-24 DIAGNOSIS — Z8541 Personal history of malignant neoplasm of cervix uteri: Secondary | ICD-10-CM | POA: Insufficient documentation

## 2017-03-24 DIAGNOSIS — Z825 Family history of asthma and other chronic lower respiratory diseases: Secondary | ICD-10-CM | POA: Diagnosis not present

## 2017-03-24 DIAGNOSIS — I252 Old myocardial infarction: Secondary | ICD-10-CM

## 2017-03-24 DIAGNOSIS — M1990 Unspecified osteoarthritis, unspecified site: Secondary | ICD-10-CM | POA: Insufficient documentation

## 2017-03-24 DIAGNOSIS — Z87891 Personal history of nicotine dependence: Secondary | ICD-10-CM

## 2017-03-24 DIAGNOSIS — Z66 Do not resuscitate: Secondary | ICD-10-CM | POA: Diagnosis present

## 2017-03-24 DIAGNOSIS — D631 Anemia in chronic kidney disease: Secondary | ICD-10-CM | POA: Insufficient documentation

## 2017-03-24 DIAGNOSIS — Z955 Presence of coronary angioplasty implant and graft: Secondary | ICD-10-CM

## 2017-03-24 DIAGNOSIS — D500 Iron deficiency anemia secondary to blood loss (chronic): Secondary | ICD-10-CM

## 2017-03-24 DIAGNOSIS — K922 Gastrointestinal hemorrhage, unspecified: Secondary | ICD-10-CM | POA: Diagnosis present

## 2017-03-24 DIAGNOSIS — Z515 Encounter for palliative care: Secondary | ICD-10-CM | POA: Diagnosis not present

## 2017-03-24 DIAGNOSIS — Z91041 Radiographic dye allergy status: Secondary | ICD-10-CM

## 2017-03-24 DIAGNOSIS — K921 Melena: Secondary | ICD-10-CM | POA: Diagnosis present

## 2017-03-24 DIAGNOSIS — D126 Benign neoplasm of colon, unspecified: Secondary | ICD-10-CM | POA: Diagnosis not present

## 2017-03-24 DIAGNOSIS — Z886 Allergy status to analgesic agent status: Secondary | ICD-10-CM

## 2017-03-24 LAB — GLUCOSE, CAPILLARY: GLUCOSE-CAPILLARY: 240 mg/dL — AB (ref 65–99)

## 2017-03-24 LAB — BASIC METABOLIC PANEL
Anion gap: 10 (ref 5–15)
BUN: 83 mg/dL — ABNORMAL HIGH (ref 6–20)
CALCIUM: 8.5 mg/dL — AB (ref 8.9–10.3)
CHLORIDE: 99 mmol/L — AB (ref 101–111)
CO2: 25 mmol/L (ref 22–32)
CREATININE: 2.52 mg/dL — AB (ref 0.44–1.00)
GFR, EST AFRICAN AMERICAN: 20 mL/min — AB (ref >60)
GFR, EST NON AFRICAN AMERICAN: 17 mL/min — AB (ref >60)
Glucose, Bld: 294 mg/dL — ABNORMAL HIGH (ref 65–99)
Potassium: 4.2 mmol/L (ref 3.5–5.1)
SODIUM: 134 mmol/L — AB (ref 135–145)

## 2017-03-24 LAB — PREPARE RBC (CROSSMATCH)

## 2017-03-24 LAB — CBC WITH DIFFERENTIAL/PLATELET
BASOS PCT: 1 %
Basophils Absolute: 0.1 10*3/uL (ref 0–0.1)
EOS ABS: 0 10*3/uL (ref 0–0.7)
Eosinophils Relative: 1 %
HCT: 16.2 % — ABNORMAL LOW (ref 35.0–47.0)
Hemoglobin: 5.6 g/dL — ABNORMAL LOW (ref 12.0–16.0)
LYMPHS ABS: 0.4 10*3/uL — AB (ref 1.0–3.6)
LYMPHS PCT: 6 %
MCH: 36.1 pg — ABNORMAL HIGH (ref 26.0–34.0)
MCHC: 34.5 g/dL (ref 32.0–36.0)
MCV: 104.6 fL — ABNORMAL HIGH (ref 80.0–100.0)
Monocytes Absolute: 0.4 10*3/uL (ref 0.2–0.9)
Monocytes Relative: 7 %
Neutro Abs: 5.1 10*3/uL (ref 1.4–6.5)
Neutrophils Relative %: 85 %
Platelets: 127 10*3/uL — ABNORMAL LOW (ref 150–440)
RBC: 1.55 MIL/uL — ABNORMAL LOW (ref 3.80–5.20)
RDW: 15.1 % — ABNORMAL HIGH (ref 11.5–14.5)
WBC: 5.9 10*3/uL (ref 3.6–11.0)

## 2017-03-24 LAB — HEMOGLOBIN AND HEMATOCRIT, BLOOD
HEMATOCRIT: 21 % — AB (ref 35.0–47.0)
Hemoglobin: 7.3 g/dL — ABNORMAL LOW (ref 12.0–16.0)

## 2017-03-24 LAB — SAMPLE TO BLOOD BANK

## 2017-03-24 LAB — IRON AND TIBC
IRON: 78 ug/dL (ref 28–170)
Saturation Ratios: 25 % (ref 10.4–31.8)
TIBC: 307 ug/dL (ref 250–450)
UIBC: 229 ug/dL

## 2017-03-24 LAB — FERRITIN: Ferritin: 78 ng/mL (ref 11–307)

## 2017-03-24 MED ORDER — SODIUM CHLORIDE 0.9 % IV SOLN
INTRAVENOUS | Status: DC
  Administered 2017-03-24 – 2017-03-25 (×3): via INTRAVENOUS

## 2017-03-24 MED ORDER — PANTOPRAZOLE SODIUM 40 MG IV SOLR
40.0000 mg | Freq: Once | INTRAVENOUS | Status: AC
  Administered 2017-03-24: 40 mg via INTRAVENOUS
  Filled 2017-03-24: qty 40

## 2017-03-24 MED ORDER — METOPROLOL SUCCINATE ER 50 MG PO TB24
50.0000 mg | ORAL_TABLET | Freq: Every day | ORAL | Status: DC
  Administered 2017-03-25: 50 mg via ORAL
  Filled 2017-03-24 (×2): qty 1

## 2017-03-24 MED ORDER — SODIUM CHLORIDE 0.9 % IV SOLN
10.0000 mL/h | Freq: Once | INTRAVENOUS | Status: AC
  Administered 2017-03-24: 10 mL/h via INTRAVENOUS

## 2017-03-24 MED ORDER — PANTOPRAZOLE SODIUM 40 MG PO TBEC: 40.0000 mg | DELAYED_RELEASE_TABLET | Freq: Every day | ORAL | Status: DC

## 2017-03-24 MED ORDER — ACETAMINOPHEN 650 MG RE SUPP
650.0000 mg | Freq: Four times a day (QID) | RECTAL | Status: DC | PRN
Start: 2017-03-24 — End: 2017-03-26

## 2017-03-24 MED ORDER — PENTAFLUOROPROP-TETRAFLUOROETH EX AERO
INHALATION_SPRAY | CUTANEOUS | Status: AC
  Filled 2017-03-24: qty 30

## 2017-03-24 MED ORDER — ONDANSETRON HCL 4 MG PO TABS: 4.0000 mg | ORAL_TABLET | Freq: Four times a day (QID) | ORAL | Status: DC | PRN

## 2017-03-24 MED ORDER — AMLODIPINE BESYLATE 5 MG PO TABS
2.5000 mg | ORAL_TABLET | Freq: Every day | ORAL | Status: DC
  Administered 2017-03-25 – 2017-03-26 (×2): 2.5 mg via ORAL
  Filled 2017-03-24 (×2): qty 1

## 2017-03-24 MED ORDER — ACETAMINOPHEN 325 MG PO TABS: 650.0000 mg | ORAL_TABLET | Freq: Four times a day (QID) | ORAL | Status: DC | PRN

## 2017-03-24 MED ORDER — BISACODYL 5 MG PO TBEC: 5.0000 mg | DELAYED_RELEASE_TABLET | Freq: Every day | ORAL | Status: DC | PRN

## 2017-03-24 MED ORDER — AMIODARONE HCL 200 MG PO TABS
200.0000 mg | ORAL_TABLET | Freq: Every day | ORAL | Status: DC
  Administered 2017-03-25 – 2017-03-26 (×2): 200 mg via ORAL
  Filled 2017-03-24 (×2): qty 1

## 2017-03-24 MED ORDER — INSULIN ASPART 100 UNIT/ML ~~LOC~~ SOLN
0.0000 [IU] | Freq: Every day | SUBCUTANEOUS | Status: DC
  Administered 2017-03-24 – 2017-03-25 (×2): 2 [IU] via SUBCUTANEOUS
  Filled 2017-03-24 (×2): qty 2

## 2017-03-24 MED ORDER — TRAZODONE HCL 50 MG PO TABS: 25.0000 mg | ORAL_TABLET | Freq: Every evening | ORAL | Status: DC | PRN

## 2017-03-24 MED ORDER — ACETAMINOPHEN 500 MG PO TABS
500.0000 mg | ORAL_TABLET | Freq: Every day | ORAL | Status: DC
  Administered 2017-03-24 – 2017-03-25 (×2): 500 mg via ORAL
  Filled 2017-03-24 (×2): qty 1

## 2017-03-24 MED ORDER — CARVEDILOL 6.25 MG PO TABS
6.2500 mg | ORAL_TABLET | Freq: Two times a day (BID) | ORAL | Status: DC
  Administered 2017-03-24 – 2017-03-25 (×2): 6.25 mg via ORAL
  Filled 2017-03-24 (×2): qty 1

## 2017-03-24 MED ORDER — PANTOPRAZOLE SODIUM 40 MG IV SOLR
40.0000 mg | Freq: Two times a day (BID) | INTRAVENOUS | Status: DC
  Administered 2017-03-24 – 2017-03-26 (×4): 40 mg via INTRAVENOUS
  Filled 2017-03-24 (×5): qty 40

## 2017-03-24 MED ORDER — INSULIN ASPART 100 UNIT/ML ~~LOC~~ SOLN
0.0000 [IU] | Freq: Three times a day (TID) | SUBCUTANEOUS | Status: DC
Start: 2017-03-25 — End: 2017-03-26
  Administered 2017-03-25 (×2): 2 [IU] via SUBCUTANEOUS
  Filled 2017-03-24: qty 2
  Filled 2017-03-24: qty 1
  Filled 2017-03-24: qty 2

## 2017-03-24 MED ORDER — DOCUSATE SODIUM 100 MG PO CAPS
100.0000 mg | ORAL_CAPSULE | Freq: Two times a day (BID) | ORAL | Status: DC
  Filled 2017-03-24 (×3): qty 1

## 2017-03-24 MED ORDER — ONDANSETRON HCL 4 MG/2ML IJ SOLN
4.0000 mg | Freq: Four times a day (QID) | INTRAMUSCULAR | Status: DC | PRN
  Administered 2017-03-25: 4 mg via INTRAVENOUS
  Filled 2017-03-24: qty 2

## 2017-03-24 NOTE — Telephone Encounter (Signed)
Lab only today - cbc, metb, hold tube, ferritin, iron.  msg sent to sch. Team. - to have pt come today.

## 2017-03-24 NOTE — Telephone Encounter (Addendum)
Called to request that she have an appt for lab check this week, Has been feeling weak, color is pale, and she has been having dark stools since last week. Please advise. Her next appt is 4/20. She is refusing to go to the ER

## 2017-03-24 NOTE — Progress Notes (Signed)
Family Meeting Note  Advance Directive:no  Today a meeting took place with the Patient.  The following clinical team members were present during this meeting:MD  The following were discussed:Patient's diagnosis: , Patient's progosis: > 12 months and Goals for treatment: Full Code  Additional follow-up to be provided: Patient would like to be DO NOT RESUSCITATE, although she is afraid that her son would not agree and want him to be also involved in the decision-making.  She is agreeable for palliative care involvement for discussion with son  Time spent during discussion:20 minutes  Max Sane, MD

## 2017-03-24 NOTE — ED Provider Notes (Signed)
[facility] Emergency Department Provider Note       Time seen: ----------------------------------------- 1:31 PM on 03/24/2017 -----------------------------------------     I have reviewed the triage vital signs and the nursing notes.   HISTORY   Chief Complaint Anemia and dark stool    HPI Morgan [name] is a 82 y.o. female who presents to the ED for anemia. Patient presents with a hemoglobin of 5.0, she was very pale and states she's been having dark stools. Patient was sent from the cancer center for possible blood transfusion. She notes intermittent black stools and weakness, denies fevers, chills, chest pain, shortness of breath or other complaints at this time. She does have a long history of anemia and has had intermittent intermittent GI bleeds in the past   Past Medical History:  Diagnosis Date  . Abnormal LFTs (liver function tests)   . Adenomatous polyps 03/05/2012  . Angiodysplasia   . Aortic stenosis   . Arthritis   . AV malformation of GI tract   . Bilateral cellulitis of lower leg   . CAD (coronary artery disease)   . Cardiomyopathy ([place])   . Carotid bruit   . Cervical cancer ([place])    cervical cancer history  . CHF (congestive heart failure) ([place])   . Chronic edema   . Clostridium difficile infection March 02, 2015  . Complication of anesthesia     I have trouble waking up "  . COPD (chronic obstructive pulmonary disease) ([place])   . Diabetes mellitus with stage 3 chronic kidney disease ([place])   . Fatty liver   . Fibrocystic breast changes   . GERD (gastroesophageal reflux disease)   . GI bleed   . Gout   . History of blood transfusion   . History of nicotine use   . Hyperlipidemia   . Hypertension   . Iron deficiency anemia 2008  . Irritable bowel syndrome   . Macrocytosis   . Osteoarthritis   . Peptic ulcer disease   . PVD (peripheral vascular disease) ([place])   . Tobacco abuse   . Trigeminal neuralgia   . Vascular  malformation 06/14/1997    Patient Active Problem List   Diagnosis Date Noted  . Hypotension 12/22/2016  . Hyperkalemia 12/22/2016  . Palliative care encounter   . Goals of care, counseling/discussion   . Pressure injury of skin 12/03/2016  . STEMI involving right coronary artery ([place]) 12/02/2016  . ST elevation myocardial infarction (STEMI) of inferior wall ([place]) 12/02/2016  . Anemia due to stage 3 chronic kidney disease 09/26/2016  . Iron deficiency anemia   . Acute posthemorrhagic anemia   . Blood in stool   . Benign neoplasm of colon   . Angiodysplasia of intestine with hemorrhage   . GIB (gastrointestinal bleeding) 08/22/2016  . AKI (acute kidney injury) ([place]) 08/22/2016  . Rectal bleeding   . Pressure ulcer 07/10/2016  . Thrombocytopenia ([place]) 07/09/2016  . Anemia of chronic kidney failure 06/24/2016  . Iron deficiency anemia due to chronic blood loss 04/29/2016  . Angiodysplasia 05/19/2015  . CA cervix ([place]) 05/19/2015  . Fatty infiltration of liver 05/19/2015  . Increased MCV 05/19/2015  . Gastroduodenal ulcer 05/19/2015  . Hypercholesterolemia without hypertriglyceridemia 03/06/2015  . Diabetes ([place]) 10/25/2014  . Absolute anemia 06/18/2014  . Aortic heart valve narrowing 06/18/2014  . 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 Arteriosclerosis of coronary artery 06/18/2014  . Cardiomyopathy ([place]) 06/18/2014  . Chronic kidney disease (CKD), stage III (moderate) 06/18/2014  .  CAFL (chronic airflow limitation) ([place]) 06/18/2014  . Type II diabetes mellitus with neurological manifestations ([place]) 06/18/2014  . Gout 06/18/2014  . Benign hypertension 06/18/2014  . Fothergill's neuralgia 06/18/2014    Past Surgical History:  Procedure Laterality Date  . ABDOMINAL HYSTERECTOMY    . angiogram cardiac stenting    . BONE MARROW BIOPSY  1999  . CARDIAC CATHETERIZATION N/A 12/02/2016   Procedure: Left Heart Cath and Coronary Angiography;  Surgeon: Wellington Hampshire, MD;  Location:  [facility];  Service: Cardiovascular;  Laterality: N/A;  . CARDIAC CATHETERIZATION N/A 12/02/2016   Procedure: Coronary Balloon Angioplasty;  Surgeon: Wellington Hampshire, MD;  Location: [facility];  Service: Cardiovascular;  Laterality: N/A;  . CHOLECYSTECTOMY    . COLONOSCOPY  12/2014, 2014, 2013, 2008, 2004   03/05/2012-Adenomatous Polyps;09/23/2003-Polyp remains intact;   . COLONOSCOPY N/A 07/12/2016   Procedure: COLONOSCOPY;  Surgeon: Ronald Lobo, MD;  Location: [facility] ENDOSCOPY;  Service: Endoscopy;  Laterality: N/A;  . COLONOSCOPY N/A 08/23/2016   Procedure: COLONOSCOPY;  Surgeon: Lucilla Lame, MD;  Location: [facility] ENDOSCOPY;  Service: Endoscopy;  Laterality: N/A;  . Endoscopic Carpal Tunnel Release    . ESOPHAGOGASTRODUODENOSCOPY  12/2014, 2014, 2013, 2009, 2004  . ESOPHAGOGASTRODUODENOSCOPY (EGD) WITH PROPOFOL N/A 07/12/2016   Procedure: ESOPHAGOGASTRODUODENOSCOPY (EGD) WITH PROPOFOL;  Surgeon: Ronald Lobo, MD;  Location: [place];  Service: Endoscopy;  Laterality: N/A;  . ESOPHAGOGASTRODUODENOSCOPY (EGD) WITH PROPOFOL N/A 08/23/2016   Procedure: ESOPHAGOGASTRODUODENOSCOPY (EGD) WITH PROPOFOL;  Surgeon: Lucilla Lame, MD;  Location: [facility] ENDOSCOPY;  Service: Endoscopy;  Laterality: N/A;  . HOT HEMOSTASIS N/A 07/12/2016   Procedure: HOT HEMOSTASIS (ARGON PLASMA COAGULATION/BICAP);  Surgeon: Ronald Lobo, MD;  Location: [facility] ENDOSCOPY;  Service: Endoscopy;  Laterality: N/A;  . NASAL SINUS SURGERY    . TEE WITHOUT CARDIOVERSION N/A 12/19/2015   Procedure: TRANSESOPHAGEAL ECHOCARDIOGRAM (TEE);  Surgeon: Yolonda Kida, MD;  Location: [facility] ORS;  Service: Cardiovascular;  Laterality: N/A;  . THORACENTESIS    . TONSILLECTOMY      Allergies Aspirin; Atenolol; Clopidogrel bisulfate; Darvon [propoxyphene]; Hydrochlorothiazide; Iodinated diagnostic agents; Nylon; Other; and Pravastatin  Social History Social History  Substance Use Topics  . Smoking status: Former Smoker     Packs/day: 0.50    Years: 56.00    Types: Cigarettes    Quit date: 05/23/2013  . Smokeless tobacco: Never Used  . Alcohol use No    Review of Systems Constitutional: Negative for fever. Cardiovascular: Negative for chest pain. Respiratory: Negative for shortness of breath. Gastrointestinal: Negative for abdominal pain, vomiting and diarrhea.Positive for black stools Genitourinary: Negative for dysuria. Musculoskeletal: Negative for back pain. Skin: Negative for rash. Neurological: Negative for headaches, positive for weakness 10-point ROS otherwise negative.  ____________________________________________   PHYSICAL EXAM:  VITAL SIGNS: ED Triage Vitals [03/24/17 1255]  Enc Vitals Group     BP (!) 143/38     Pulse Rate 63     Resp 18     Temp 97.7 F (36.5 C)     Temp Source Oral     SpO2 98 %     Weight 153 lb (69.4 kg)     Height      Head Circumference      Peak Flow      Pain Score      Pain Loc      Pain Edu?      Excl. in [place]?     Constitutional: Alert and oriented. Well appearing and in no distress.  Eyes: Conjunctivae are pale. PERRL. Normal extraocular movements. ENT   Head: Normocephalic and atraumatic.   Nose: No congestion/rhinnorhea.   Mouth/Throat: Mucous membranes are moist.   Neck: No stridor. Cardiovascular: Normal rate, regular rhythm. Harsh systolic murmur is noted Respiratory: Normal respiratory effort without tachypnea nor retractions. Breath sounds are clear and equal bilaterally. No wheezes/rales/rhonchi. Gastrointestinal: Soft and nontender. Normal bowel sounds Rectal: Black stools, nontender, heme-positive Musculoskeletal: Nontender with normal range of motion in extremities. No lower extremity tenderness nor edema. Neurologic:  Normal speech and language. No gross focal neurologic deficits are appreciated.  Skin:  Skin is warm, dry and intact. Pallor is noted Psychiatric: Mood and affect are normal. Speech and behavior are  normal.  ____________________________________________  EKG: Interpreted by me. Sinus rhythm, old inferior infarct, rate of 69 bpm, long QT, nonspecific ST segment changes  ____________________________________________  ED COURSE:  Pertinent labs & imaging results that were available during my care of the patient were reviewed by me and considered in my medical decision making (see chart for details). Patient presents for possible anemia, we will assess with labs and imaging as indicated.   Procedures ____________________________________________   LABS (pertinent positives/negatives)  Labs Reviewed  POC OCCULT BLOOD, ED  TYPE AND SCREEN  PREPARE RBC (CROSSMATCH)   ____________________________________________  FINAL ASSESSMENT AND PLAN  Anemia, heme-positive stool  Plan: Patient's labs and imaging were dictated above. Patient had presented for anemia and is found to be profoundly anemic with a hemoglobin of 5.6. We have typed and crossed her for 2 units, she does have a history of AVM. She is not currently taking anticoagulants other than baby aspirin. We will place her on ant acids and discussed with the hospitalist for admission.   Earleen Newport, MD   Note: This note was generated in part or whole with voice recognition software. Voice recognition is usually quite accurate but there are transcription errors that can and very often do occur. I apologize for any typographical errors that were not detected and corrected.     Earleen Newport, MD 03/24/17 [phone]

## 2017-03-24 NOTE — H&P (Addendum)
[place] [facility] [place] NAME: Morgan [name]    MR#:  [id number]  DATE OF BIRTH:  06-28-1935  DATE OF ADMISSION:  03/24/2017  PRIMARY CARE PHYSICIAN: Kirk Ruths., MD   REQUESTING/REFERRING PHYSICIAN: Earleen Newport, MD  CHIEF COMPLAINT:   Chief Complaint  Patient presents with  . Anemia  . dark stool   HISTORY OF PRESENT ILLNESS:  Morgan [name]  is a 82 y.o. female with a known history of Stomach ulcers, AVMs, CHF, CAD is being admitted for severe anemia.  She has been feeling tired for last 2 weeks.  She is not able to walk to her kitchen due to being tired.  Normally, she was using cane but now she is requiring a walker.  She lives with her son.  She has also been noticing dark stool for last 2 weeks.  While in the emergency department, Her labs showed hemoglobin of 5.0. She does have a long history of anemia and has had intermittent intermittent GI bleeds in the past.  She has had multiple colonoscopies in the past. PAST MEDICAL HISTORY:   Past Medical History:  Diagnosis Date  . Abnormal LFTs (liver function tests)   . Adenomatous polyps 03/05/2012  . Angiodysplasia   . Aortic stenosis   . Arthritis   . AV malformation of GI tract   . Bilateral cellulitis of lower leg   . CAD (coronary artery disease)   . Cardiomyopathy ([place])   . Carotid bruit   . Cervical cancer ([place])    cervical cancer history  . CHF (congestive heart failure) ([place])   . Chronic edema   . Clostridium difficile infection March 02, 2015  . Complication of anesthesia     I have trouble waking up "  . COPD (chronic obstructive pulmonary disease) ([place])   . Diabetes mellitus with stage 3 chronic kidney disease ([place])   . Fatty liver   . Fibrocystic breast changes   . GERD (gastroesophageal reflux disease)   . GI bleed   . Gout   . History of blood transfusion   . History of nicotine use   . Hyperlipidemia   . Hypertension   . Iron deficiency  anemia 2008  . Irritable bowel syndrome   . Macrocytosis   . Osteoarthritis   . Peptic ulcer disease   . PVD (peripheral vascular disease) ([place])   . Tobacco abuse   . Trigeminal neuralgia   . Vascular malformation 06/14/1997   PAST SURGICAL HISTORY:   Past Surgical History:  Procedure Laterality Date  . ABDOMINAL HYSTERECTOMY    . angiogram cardiac stenting    . BONE MARROW BIOPSY  1999  . CARDIAC CATHETERIZATION N/A 12/02/2016   Procedure: Left Heart Cath and Coronary Angiography;  Surgeon: Wellington Hampshire, MD;  Location: [facility] LAB;  Service: Cardiovascular;  Laterality: N/A;  . CARDIAC CATHETERIZATION N/A 12/02/2016   Procedure: Coronary Balloon Angioplasty;  Surgeon: Wellington Hampshire, MD;  Location: [facility] CV LAB;  Service: Cardiovascular;  Laterality: N/A;  . CHOLECYSTECTOMY    . COLONOSCOPY  12/2014, 2014, 2013, 2008, 2004   03/05/2012-Adenomatous Polyps;09/23/2003-Polyp remains intact;   . COLONOSCOPY N/A 07/12/2016   Procedure: COLONOSCOPY;  Surgeon: Ronald Lobo, MD;  Location: [facility] ENDOSCOPY;  Service: Endoscopy;  Laterality: N/A;  . COLONOSCOPY N/A 08/23/2016   Procedure: COLONOSCOPY;  Surgeon: Lucilla Lame, MD;  Location: [facility] ENDOSCOPY;  Service: Endoscopy;  Laterality: N/A;  . Endoscopic Carpal Tunnel Release    .  ESOPHAGOGASTRODUODENOSCOPY  12/2014, 2014, 2013, 2009, 2004  . ESOPHAGOGASTRODUODENOSCOPY (EGD) WITH PROPOFOL N/A 07/12/2016   Procedure: ESOPHAGOGASTRODUODENOSCOPY (EGD) WITH PROPOFOL;  Surgeon: Ronald Lobo, MD;  Location: [place] [facility];  Service: Endoscopy;  Laterality: N/A;  . ESOPHAGOGASTRODUODENOSCOPY (EGD) WITH PROPOFOL N/A 08/23/2016   Procedure: ESOPHAGOGASTRODUODENOSCOPY (EGD) WITH PROPOFOL;  Surgeon: Lucilla Lame, MD;  Location: [facility] ENDOSCOPY;  Service: Endoscopy;  Laterality: N/A;  . HOT HEMOSTASIS N/A 07/12/2016   Procedure: HOT HEMOSTASIS (ARGON PLASMA COAGULATION/BICAP);  Surgeon: Ronald Lobo, MD;  Location: [facility] ENDOSCOPY;  Service:  Endoscopy;  Laterality: N/A;  . NASAL SINUS SURGERY    . TEE WITHOUT CARDIOVERSION N/A 12/19/2015   Procedure: TRANSESOPHAGEAL ECHOCARDIOGRAM (TEE);  Surgeon: Yolonda Kida, MD;  Location: [facility] ORS;  Service: Cardiovascular;  Laterality: N/A;  . THORACENTESIS    . TONSILLECTOMY      SOCIAL HISTORY:   Social History  Substance Use Topics  . Smoking status: Former Smoker    Packs/day: 0.50    Years: 56.00    Types: Cigarettes    Quit date: 05/23/2013  . Smokeless tobacco: Never Used  . Alcohol use No    FAMILY HISTORY:   Family History  Problem Relation Age of Onset  . Parkinsonism Mother   . Heart attack Mother   . Lung cancer Father   . Diabetes Mellitus II Father   . Asthma Sister   . Asthma Brother   . Heart disease Son    DRUG ALLERGIES:   Allergies  Allergen Reactions  . Aspirin Other (See Comments)    bleeding  . Atenolol Other (See Comments)    fatigue  . Clopidogrel Bisulfate Other (See Comments)    bleeding  . Darvon [Propoxyphene] Hives and Other (See Comments)  . Hydrochlorothiazide Other (See Comments)    Fatigue and cramps  . Iodinated Diagnostic Agents Other (See Comments)    Questionable GI bleed  . Nylon Hives  . Other Other (See Comments)    Nylon sutures  . Pravastatin Other (See Comments)    Severe cramping   REVIEW OF SYSTEMS:  Review of Systems  Constitutional: Positive for malaise/fatigue. Negative for chills, fever and weight loss.  HENT: Negative for nosebleeds and sore throat.   Eyes: Negative for blurred vision.  Respiratory: Negative for cough, shortness of breath and wheezing.   Cardiovascular: Negative for chest pain, orthopnea, leg swelling and PND.  Gastrointestinal: Positive for blood in stool and melena. Negative for abdominal pain, constipation, diarrhea, heartburn, nausea and vomiting.  Genitourinary: Negative for dysuria and urgency.  Musculoskeletal: Negative for back pain.  Skin: Negative for rash.  Neurological:  Positive for weakness. Negative for dizziness, speech change, focal weakness and headaches.  Endo/Heme/Allergies: Does not bruise/bleed easily.  Psychiatric/Behavioral: Negative for depression.   MEDICATIONS AT HOME:   Prior to Admission medications   Medication Sig Start Date End Date Taking? Authorizing Provider  acetaminophen (TYLENOL) 500 MG tablet Take 500 mg by mouth at bedtime.   Yes Historical Provider, MD  amiodarone (PACERONE) 200 MG tablet Take 1 tablet (200 mg total) by mouth 2 (two) times daily. Patient taking differently: Take 200 mg by mouth daily.  12/06/16  Yes Michaelia Beilfuss Manuella Ghazi, MD  amLODipine (NORVASC) 2.5 MG tablet Take 1 tablet (2.5 mg total) by mouth daily. 08/24/16  Yes Srikar Sudini, MD  aspirin 81 MG chewable tablet Chew 1 tablet (81 mg total) by mouth daily. 12/06/16  Yes Dreonna Hussein Manuella Ghazi, MD  atorvastatin (LIPITOR) 40 MG tablet Take 1 tablet (40 mg  total) by mouth daily at 6 PM. 12/06/16  Yes  Manuella Ghazi, MD  carvedilol (COREG) 6.25 MG tablet Take 6.25 mg by mouth 2 (two) times daily with a meal.   Yes Historical Provider, MD  pantoprazole (PROTONIX) 40 MG tablet Take 40 mg by mouth daily.    Yes Historical Provider, MD  spironolactone (ALDACTONE) 25 MG tablet Take 25 mg by mouth daily.   Yes Historical Provider, MD  torsemide (DEMADEX) 100 MG tablet Take 100 mg by mouth daily.   Yes Historical Provider, MD  clopidogrel (PLAVIX) 75 MG tablet Take 1 tablet (75 mg total) by mouth daily with breakfast. Patient not taking: Reported on 03/24/2017 12/06/16   Max Sane, MD  diclofenac sodium (VOLTAREN) 1 % GEL Apply 2 g topically 4 (four) times daily as needed. For pain    Historical Provider, MD  lisinopril (PRINIVIL,ZESTRIL) 40 MG tablet Take 40 mg by mouth daily.    Historical Provider, MD  metolazone (ZAROXOLYN) 10 MG tablet Take 10 mg by mouth daily as needed. For fluid/swelling    Historical Provider, MD  metoprolol succinate (TOPROL-XL) 50 MG 24 hr tablet Take 50 mg by mouth daily.      Historical Provider, MD  potassium chloride (K-DUR) 10 MEQ tablet Take 10 mEq by mouth 2 (two) times daily.    Historical Provider, MD   VITAL SIGNS:  Blood pressure (!) 136/46, pulse 68, temperature 97.9 F (36.6 C), temperature source Oral, resp. rate 18, weight 69.4 kg (153 lb), SpO2 96 %. PHYSICAL EXAMINATION:  Physical Exam  GENERAL:  82 y.o.-year-old patient lying in the bed with no acute distress. She looks pale EYES: Pupils equal, round, reactive to light and accommodation. No scleral icterus. Extraocular muscles intact.   HEENT: Head atraumatic, normocephalic. Oropharynx and nasopharynx clear.  NECK:  Supple, no jugular venous distention. No thyroid enlargement, no tenderness.  LUNGS: Normal breath sounds bilaterally, no wheezing, rales,rhonchi or crepitation. No use of accessory muscles of respiration.  CARDIOVASCULAR: S1, S2 normal. No murmurs, rubs, or gallops.  ABDOMEN: Soft, nontender, nondistended. Bowel sounds present. No organomegaly or mass.  EXTREMITIES: No pedal edema, cyanosis, or clubbing.  NEUROLOGIC: Cranial nerves II through XII are intact. Muscle strength 5/5 in all extremities. Sensation intact. Gait not checked.  PSYCHIATRIC: The patient is alert and oriented x 3.  SKIN: No obvious rash, lesion, or ulcer.  LABORATORY PANEL:   CBC  Recent Labs Lab 03/24/17 1151  WBC 5.9  HGB 5.6*  HCT 16.2*  PLT 127*   ------------------------------------------------------------------------------------------------------------------  Chemistries   Recent Labs Lab 03/24/17 1151  NA 134*  K 4.2  CL 99*  CO2 25  GLUCOSE 294*  BUN 83*  CREATININE 2.52*  CALCIUM 8.5*   IMPRESSION AND PLAN:  82 year old female with known history of coronary artery disease, CHF, and AVMs is being admitted for severe anemia  * Severe blood loss anemia - Likely acute on chronic  * Acute GI bleed - protonix IV bid for now  - Platelets are 127, puts her at high risk for  bleeding - Hold aspirin, Plavix or any other blood thinner.  * Diabetes - Check hemoglobin A1c - Started her on sliding scale insulin and monitor blood sugar  * Acute on Chronic kidney disease stage 4. Baseline creatinine 1.64/GFR 29 on 09/26/2016 - Creatinine at 2.5  - Avoid nephrotoxin - Related renal function   All the records are reviewed and case discussed with ED provider. Management plans discussed with the patient, family  and they are in agreement.  CODE STATUS: Full code  TOTAL TIME TAKING CARE OF THIS PATIENT: 45 minutes.    Max Sane M.D on 03/24/2017 at 3:37 PM  Between 7am to 6pm - Pager - [phone]  After 6pm go to [email or web address] - Proofreader  [facility]  Office  [phone]  CC: Primary care physician; Kirk Ruths., MD   Note: This dictation was prepared with Dragon dictation along with smaller phrase technology. Any transcriptional errors that result from this process are unintentional.

## 2017-03-24 NOTE — Plan of Care (Signed)
Problem: Pain Managment: Goal: General experience of comfort will improve Outcome: Progressing Patient has had no complaints of pain  Problem: Bowel/Gastric: Goal: Will show no signs and symptoms of gastrointestinal bleeding Outcome: Progressing Patient has not had any stool since arriving to unit.

## 2017-03-24 NOTE — ED Triage Notes (Signed)
Pt sent over from [facility] center for blood transfusion. Pt with hgb of 5.0. Pt very pale in triage and states she has had dark stools.

## 2017-03-24 NOTE — Progress Notes (Signed)
Patient presented in clinic today for lab only. hgb 5.6 today. Patient c/o "dark bloody stools." pt very pale, "extremely weak." pt is alert/oriented.  c/o epigastric pressure.  Spoke with Dr. Rogue Bussing. V/o to transport pt to ED for further work -up and evaluation. Pt has a chronic h/o of chronic renal disease and IDA-r/t to blood loss. h/o AV malformation of cecum/stomach. Per pt she recently had a MI at the first of the year. Give her multiple health complications, md advised to have RN transport pt via wheelchair to ED. Pt has already been banded today for a blood bank hold tube.   Call report to Colletta [place], RN in ED before transport.

## 2017-03-25 ENCOUNTER — Inpatient Hospital Stay: Payer: Medicare Other | Admitting: Anesthesiology

## 2017-03-25 ENCOUNTER — Encounter: Payer: Self-pay | Admitting: Anesthesiology

## 2017-03-25 ENCOUNTER — Encounter
Admission: EM | Disposition: A | Discharge status: Home Health | Payer: Self-pay | Source: Home / Self Care | Attending: Internal Medicine

## 2017-03-25 DIAGNOSIS — Z7189 Other specified counseling: Secondary | ICD-10-CM

## 2017-03-25 DIAGNOSIS — Z515 Encounter for palliative care: Secondary | ICD-10-CM

## 2017-03-25 DIAGNOSIS — D500 Iron deficiency anemia secondary to blood loss (chronic): Secondary | ICD-10-CM

## 2017-03-25 DIAGNOSIS — R195 Other fecal abnormalities: Secondary | ICD-10-CM

## 2017-03-25 HISTORY — PX: ESOPHAGOGASTRODUODENOSCOPY (EGD) WITH PROPOFOL: SHX5813

## 2017-03-25 LAB — GLUCOSE, CAPILLARY
GLUCOSE-CAPILLARY: 129 mg/dL — AB (ref 65–99)
GLUCOSE-CAPILLARY: 189 mg/dL — AB (ref 65–99)
GLUCOSE-CAPILLARY: 235 mg/dL — AB (ref 65–99)
Glucose-Capillary: 177 mg/dL — ABNORMAL HIGH (ref 65–99)

## 2017-03-25 LAB — CBC
HEMATOCRIT: 27 % — AB (ref 35.0–47.0)
HEMOGLOBIN: 9.5 g/dL — AB (ref 12.0–16.0)
MCH: 31.9 pg (ref 26.0–34.0)
MCHC: 35 g/dL (ref 32.0–36.0)
MCV: 91.2 fL (ref 80.0–100.0)
Platelets: 110 10*3/uL — ABNORMAL LOW (ref 150–440)
RBC: 2.96 MIL/uL — AB (ref 3.80–5.20)
RDW: 20.1 % — ABNORMAL HIGH (ref 11.5–14.5)
WBC: 6.1 10*3/uL (ref 3.6–11.0)

## 2017-03-25 LAB — BASIC METABOLIC PANEL
ANION GAP: 7 (ref 5–15)
BUN: 69 mg/dL — ABNORMAL HIGH (ref 6–20)
CHLORIDE: 104 mmol/L (ref 101–111)
CO2: 27 mmol/L (ref 22–32)
Calcium: 8.4 mg/dL — ABNORMAL LOW (ref 8.9–10.3)
Creatinine, Ser: 2.14 mg/dL — ABNORMAL HIGH (ref 0.44–1.00)
GFR calc Af Amer: 24 mL/min — ABNORMAL LOW (ref >60)
GFR, EST NON AFRICAN AMERICAN: 21 mL/min — AB (ref >60)
Glucose, Bld: 189 mg/dL — ABNORMAL HIGH (ref 65–99)
POTASSIUM: 3.6 mmol/L (ref 3.5–5.1)
SODIUM: 138 mmol/L (ref 135–145)

## 2017-03-25 LAB — PREPARE RBC (CROSSMATCH)

## 2017-03-25 SURGERY — ESOPHAGOGASTRODUODENOSCOPY (EGD) WITH PROPOFOL
Anesthesia: General

## 2017-03-25 MED ORDER — LIDOCAINE HCL (CARDIAC) 20 MG/ML IV SOLN
INTRAVENOUS | Status: DC | PRN
  Administered 2017-03-25: 100 mg via INTRAVENOUS

## 2017-03-25 MED ORDER — POLYETHYLENE GLYCOL 3350 17 GM/SCOOP PO POWD
1.0000 | Freq: Once | ORAL | Status: AC
Start: 2017-03-25 — End: 2017-03-25
  Administered 2017-03-25: 255 g via ORAL
  Filled 2017-03-25 (×2): qty 255

## 2017-03-25 MED ORDER — GLYCOPYRROLATE 0.2 MG/ML IJ SOLN
INTRAMUSCULAR | Status: DC | PRN
  Administered 2017-03-25: 0.2 mg via INTRAVENOUS

## 2017-03-25 MED ORDER — SODIUM CHLORIDE 0.9 % IV SOLN
Freq: Once | INTRAVENOUS | Status: AC
  Administered 2017-03-25: 04:00:00 via INTRAVENOUS

## 2017-03-25 MED ORDER — PROPOFOL 500 MG/50ML IV EMUL
INTRAVENOUS | Status: DC | PRN
  Administered 2017-03-25: 150 ug/kg/min via INTRAVENOUS

## 2017-03-25 MED ORDER — PROPOFOL 10 MG/ML IV BOLUS
INTRAVENOUS | Status: DC | PRN
  Administered 2017-03-25: 50 mg via INTRAVENOUS
  Administered 2017-03-25: 20 mg via INTRAVENOUS

## 2017-03-25 MED ORDER — GLYCOPYRROLATE 0.2 MG/ML IJ SOLN
INTRAMUSCULAR | Status: AC
  Filled 2017-03-25: qty 1

## 2017-03-25 MED ORDER — LIDOCAINE HCL (PF) 2 % IJ SOLN
INTRAMUSCULAR | Status: AC
  Filled 2017-03-25: qty 2

## 2017-03-25 NOTE — Transfer of Care (Signed)
Immediate Anesthesia Transfer of Care Note  Patient: Morgan [name]  Procedure(s) Performed: Procedure(s): ESOPHAGOGASTRODUODENOSCOPY (EGD) WITH PROPOFOL (N/A)  Patient Location: Endoscopy Unit  Anesthesia Type:General  Level of Consciousness: sedated  Airway & Oxygen Therapy: Patient Spontanous Breathing and Patient connected to nasal cannula oxygen  Post-op Assessment: Report given to RN and Post -op Vital signs reviewed and stable  Post vital signs: Reviewed and stable  Last Vitals:  Vitals:   03/25/17 0631 03/25/17 0840  BP: (!) 155/51 (!) 164/53  Pulse: 65 96  Resp: 18 16  Temp: 36.8 C 37.1 C    Last Pain:  Vitals:   03/25/17 0840  TempSrc: Tympanic  PainSc:          Complications: No apparent anesthesia complications

## 2017-03-25 NOTE — Consult Note (Signed)
Consultation Note Date: 03/25/2017   Patient Name: Morgan [name]  DOB: Dec 28, 1934  MRN: [id number]  Age / Sex: 82 y.o., female  PCP: Kirk Ruths, MD Referring Physician: Demetrios Loll, MD  Reason for Consultation: Establishing goals of care  HPI/Patient Profile: 82 y.o. female  with past medical history of COPD, DM, CHF (EF 30-35), CAD s/p 2 stents, ESRN on PD, GIB secondary to AVMs, Aortic stenosis, and fatty liver disease who was admitted on 03/24/17 with severe symptomatic anemia and melena.  Her hgb was 5.0.  She received 3 units of PRBCs and underwent EGD on 4/3.  The EGD did not show an etiology for bleeding.  She is scheduled to undergo colonoscopy at 7:30 am in the morning.  PMT was consulted for code status.  I met with Morgan [name] in December 2017 when she had and STEMI.  At that time she wanted to be a DNR and have Palliative follow her outpatient.   I met with Morgan [name] today and she tells me that rebounded back rather well after her code blue in December 2017 and feels that if she had a chance of being successful and returning to her base line (as she did previously) she would want to be coded again.  She does not have much recollection of her hospital stay in December.   However, she went on to say that she has a terrible fear of being intubated or coded and not surviving.  Consequently if the doctors did not think she had a good chance of survival she would not want to be coded.  We discuss an Advanced Directive and decided to meet again on 4/4 with her son (and HCPOA) present.   Primary Decision Maker:  PATIENT    SUMMARY OF RECOMMENDATIONS    Will follow up on 4/4 with patient and son for Advanced Directives. Presently patient wants to remain full code, and full scope treatment.   Symptom Management:  Per primary team  Additional Recommendations (Limitations, Scope,  Preferences):  Full Scope Treatment   Psycho-social/Spiritual:   Desire for further Chaplaincy support: yes, [facility]   Prognosis:   < 12 months  Discharge Planning: To Be Determined  To home with son most likely - to be followed [place] and Palliative.      Primary Diagnoses: Present on Admission: . Acute GI bleeding   I have reviewed the medical record, interviewed the patient and family, and examined the patient. The following aspects are pertinent.  Past Medical History:  Diagnosis Date  . Abnormal LFTs (liver function tests)   . Adenomatous polyps 03/05/2012  . Angiodysplasia   . Aortic stenosis   . Arthritis   . AV malformation of GI tract   . Bilateral cellulitis of lower leg   . CAD (coronary artery disease)   . Cardiomyopathy ([place])   . Carotid bruit   . Cervical cancer ([place])    cervical cancer history  . CHF (congestive heart failure) ([place])   . Chronic edema   .  Clostridium difficile infection March 02, 2015  . Complication of anesthesia     I have trouble waking up "  . COPD (chronic obstructive pulmonary disease) ([place])   . Diabetes mellitus with stage 3 chronic kidney disease ([place])   . Fatty liver   . Fibrocystic breast changes   . GERD (gastroesophageal reflux disease)   . GI bleed   . Gout   . History of blood transfusion   . History of nicotine use   . Hyperlipidemia   . Hypertension   . Iron deficiency anemia 2008  . Irritable bowel syndrome   . Macrocytosis   . Osteoarthritis   . Peptic ulcer disease   . PVD (peripheral vascular disease) ([place])   . Tobacco abuse   . Trigeminal neuralgia   . Vascular malformation 06/14/1997   Social History   Social History  . Marital status: Married    Spouse name: N/A  . Number of children: N/A  . Years of education: N/A   Social History Main Topics  . Smoking status: Former Smoker    Packs/day: 0.50    Years: 56.00    Types: Cigarettes    Quit date: 05/23/2013  . Smokeless tobacco: Never Used   . Alcohol use No  . Drug use: No  . Sexual activity: No   Other Topics Concern  . None   Social History Narrative  . None   Family History  Problem Relation Age of Onset  . Parkinsonism Mother   . Heart attack Mother   . Lung cancer Father   . Diabetes Mellitus II Father   . Asthma Sister   . Asthma Brother   . Heart disease Son    Scheduled Meds: . acetaminophen  500 mg Oral QHS  . amiodarone  200 mg Oral Daily  . amLODipine  2.5 mg Oral Daily  . carvedilol  6.25 mg Oral BID WC  . docusate sodium  100 mg Oral BID  . insulin aspart  0-5 Units Subcutaneous QHS  . insulin aspart  0-9 Units Subcutaneous TID WC  . metoprolol succinate  50 mg Oral Daily  . pantoprazole (PROTONIX) IV  40 mg Intravenous Q12H  . polyethylene glycol powder  1 Container Oral Once   Continuous Infusions: . sodium chloride 50 mL/hr at 03/25/17 0825   PRN Meds:.acetaminophen **OR** acetaminophen, bisacodyl, ondansetron **OR** ondansetron (ZOFRAN) IV, traZODone Allergies  Allergen Reactions  . Aspirin Other (See Comments)    bleeding  . Atenolol Other (See Comments)    fatigue  . Clopidogrel Bisulfate Other (See Comments)    bleeding  . Darvon [Propoxyphene] Hives and Other (See Comments)  . Hydrochlorothiazide Other (See Comments)    Fatigue and cramps  . Iodinated Diagnostic Agents Other (See Comments)    Questionable GI bleed  . Nylon Hives  . Other Other (See Comments)    Nylon sutures  . Pravastatin Other (See Comments)    Severe cramping   Review of Systems no pain, sob, anorexia currently.  She has been experiencing weakness and dyspnea over the last 2 weeks.  Physical Exam  Well developed pleasant elderly female.  Awake, Alert, Cooperative CV rrr resp NAD Abdomen soft, nt, nd Ext trace edema  Vital Signs: BP (!) 150/68   Pulse (!) 57   Temp 98.1 F (36.7 C) (Oral)   Resp (!) 22   Ht 5' 3"  (1.6 m)   Wt 69.4 kg (153 lb)   SpO2 96%   BMI 27.10 kg/m  Pain Assessment:  No/denies pain POSS *See Group Information*: 1-Acceptable,Awake and alert Pain Score: 0-No pain   SpO2: SpO2: 96 % O2 Device:SpO2: 96 % O2 Flow Rate: .O2 Flow Rate (L/min): 3 L/min  IO: Intake/output summary:  Intake/Output Summary (Last 24 hours) at 03/25/17 1601 Last data filed at 03/25/17 0908  Gross per 24 hour  Intake          2593.67 ml  Output             1500 ml  Net          1093.67 ml    LBM: Last BM Date: 03/24/17 Baseline Weight: Weight: 69.4 kg (153 lb) Most recent weight: Weight: 69.4 kg (153 lb)     Palliative Assessment/Data:   Flowsheet Rows     Most Recent Value  Intake Tab  Referral Department  Hospitalist  Unit at Time of Referral  Cardiac/Telemetry Unit  Palliative Care Primary Diagnosis  Other (Comment)  Date Notified  03/25/17  Palliative Care Type  Return patient Palliative Care  Reason for referral  Clarify Goals of Care  Date of Admission  03/24/17  Date first seen by Palliative Care  03/25/17  # of days Palliative referral response time  0 Day(s)  # of days IP prior to Palliative referral  1  Clinical Assessment  Palliative Performance Scale Score  50%  Psychosocial & Spiritual Assessment  Palliative Care Outcomes  Patient/Family meeting held?  Yes  Who was at the meeting?  patient and son (on the phone)  Palliative Care Outcomes  Clarified goals of care      Time In: 3:00 Time Out: 4:15 Time Total: 75 min. Greater than 50%  of this time was spent counseling and coordinating care related to the above assessment and plan.  Signed by: Imogene Burn, PA-C Palliative Medicine Pager: [phone]  Please contact Palliative Medicine Team phone at [phone] for questions and concerns.  For individual provider: See Shea Evans

## 2017-03-25 NOTE — Progress Notes (Signed)
Inpatient Diabetes Program Recommendations  AACE/ADA: New Consensus Statement on Inpatient Glycemic Control (2015)  Target Ranges:  Prepandial:   less than 140 mg/dL      Peak postprandial:   less than 180 mg/dL (1-2 hours)      Critically ill patients:  140 - 180 mg/dL   Lab Results  Component Value Date   GLUCAP 129 (H) 03/25/2017   HGBA1C 6.3 (H) 12/02/2016    Review of Glycemic Control  Results for LAWSYN, HEILER (MRN [id number]) as of 03/25/2017 12:06  Ref. Range 03/24/2017 20:30 03/25/2017 08:19 03/25/2017 12:02  Glucose-Capillary Latest Ref Range: 65 - 99 mg/dL 240 (H) 177 (H) 129 (H)    Diabetes history: Type 2 Outpatient Diabetes medications: none Current orders for Inpatient glycemic control: Novolog 0-9 units tid, Novolog 0-5 units qhs  Inpatient Diabetes Program Recommendations:  please change her diet to carb modified.  Gentry Fitz, RN, BA, MHA, CDE Diabetes Coordinator Inpatient Diabetes Program  [phone] (Team Pager) [phone] ([place]) 03/25/2017 12:18 PM

## 2017-03-25 NOTE — Op Note (Signed)
[facility] Gastroenterology Patient Name: Morgan [name] Procedure Date: 03/25/2017 8:57 AM MRN: [id number] Account #: [id number] Date of Birth: Sep 05, 1935 Admit Type: Inpatient Age: 82 Room: [facility] ENDO ROOM 4 Gender: Female Note Status: Finalized Procedure:            Upper GI endoscopy Indications:          Iron deficiency anemia Providers:            Lucilla Lame MD, MD Referring MD:         Ocie Cornfield. Ouida Sills MD, MD (Referring MD) Medicines:            Propofol per Anesthesia Complications:        No immediate complications. Procedure:            Pre-Anesthesia Assessment:                       - Prior to the procedure, a History and Physical was                        performed, and patient medications and allergies were                        reviewed. The patient's tolerance of previous                        anesthesia was also reviewed. The risks and benefits of                        the procedure and the sedation options and risks were                        discussed with the patient. All questions were                        answered, and informed consent was obtained. Prior                        Anticoagulants: The patient has taken no previous                        anticoagulant or antiplatelet agents. ASA Grade                        Assessment: II - A patient with mild systemic disease.                        After reviewing the risks and benefits, the patient was                        deemed in satisfactory condition to undergo the                        procedure.                       After obtaining informed consent, the endoscope was                        passed under direct vision. Throughout the procedure,  the patient's blood pressure, pulse, and oxygen                        saturations were monitored continuously. The Endoscope                        was introduced through the mouth, and advanced to the                 second part of duodenum. The upper GI endoscopy was                        accomplished without difficulty. The patient tolerated                        the procedure well. Findings:      The examined esophagus was normal.      Diffuse moderate inflammation characterized by erythema was found in the       cardia, in the gastric fundus and in the gastric body.      The examined duodenum was normal. Impression:           - Normal esophagus.                       - Gastritis.                       - Normal examined duodenum.                       - No specimens collected. Recommendation:       - Return patient to hospital ward for ongoing care.                       - Perform a colonoscopy tomorrow. Procedure Code(s):    --- Professional ---                       [id number], Esophagogastroduodenoscopy, flexible, transoral;                        diagnostic, including collection of specimen(s) by                        brushing or washing, when performed (separate procedure) Diagnosis Code(s):    --- Professional ---                       D50.9, Iron deficiency anemia, unspecified                       K29.70, Gastritis, unspecified, without bleeding CPT copyright 2016 American Medical Association. All rights reserved. The codes documented in this report are preliminary and upon coder review may  be revised to meet current compliance requirements. Lucilla Lame MD, MD 03/25/2017 9:09:12 AM This report has been signed electronically. Number of Addenda: 0 Note Initiated On: 03/25/2017 8:57 AM      [facility]

## 2017-03-25 NOTE — Plan of Care (Signed)
Problem: Bowel/Gastric: Goal: Will show no signs and symptoms of gastrointestinal bleeding Outcome: Progressing Patient has had no stool today

## 2017-03-25 NOTE — Anesthesia Preprocedure Evaluation (Signed)
Anesthesia Evaluation  Patient identified by MRN, date of birth, ID band Patient awake    Reviewed: Allergy & Precautions, H&P , NPO status , Patient's Chart, lab work & pertinent test results, reviewed documented beta blocker date and time   History of Anesthesia Complications (+) PROLONGED EMERGENCE and history of anesthetic complications  Airway Mallampati: I  TM Distance: >3 FB Neck ROM: full    Dental  (+) Poor Dentition, Missing, Chipped   Pulmonary shortness of breath, neg sleep apnea, COPD,  COPD inhaler, neg recent URI, former smoker,           Cardiovascular Exercise Tolerance: Good hypertension, (-) angina+ CAD, + Past MI, + Cardiac Stents, + Peripheral Vascular Disease and +CHF  (-) CABG (-) dysrhythmias + Valvular Problems/Murmurs AS      Neuro/Psych neg Seizures  Neuromuscular disease CVA negative psych ROS   GI/Hepatic PUD, GERD  ,NAFLD   Endo/Other  diabetes  Renal/GU CRFRenal disease  negative genitourinary   Musculoskeletal   Abdominal   Peds  Hematology  (+) Blood dyscrasia, anemia ,   Anesthesia Other Findings Past Medical History: No date: Abnormal LFTs (liver function tests) 03/05/2012: Adenomatous polyps No date: Angiodysplasia No date: Aortic stenosis No date: Arthritis No date: AV malformation of GI tract No date: Bilateral cellulitis of lower leg No date: CAD (coronary artery disease) No date: Cardiomyopathy [facility]) No date: Carotid bruit No date: Cervical cancer ([facility])     Comment: cervical cancer history No date: CHF (congestive heart failure) ([place]) No date: Chronic edema March 02, 2015: Clostridium difficile infection No date: Complication of anesthesia     Comment:  I have trouble waking up " No date: COPD (chronic obstructive pulmonary disease) (* No date: Diabetes mellitus with stage 3 chronic kidney * No date: Fatty liver No date: Fibrocystic breast changes No date: GERD  (gastroesophageal reflux disease) No date: GI bleed No date: Gout No date: History of blood transfusion No date: History of nicotine use No date: Hyperlipidemia No date: Hypertension 2008: Iron deficiency anemia No date: Irritable bowel syndrome No date: Macrocytosis No date: Osteoarthritis No date: Peptic ulcer disease No date: PVD (peripheral vascular disease) ([place]) No date: Tobacco abuse No date: Trigeminal neuralgia 06/14/1997: Vascular malformation   Reproductive/Obstetrics negative OB ROS                             Anesthesia Physical Anesthesia Plan  ASA: IV  Anesthesia Plan: General   Post-op Pain Management:    Induction:   Airway Management Planned:   Additional Equipment:   Intra-op Plan:   Post-operative Plan:   Informed Consent: I have reviewed the patients History and Physical, chart, labs and discussed the procedure including the risks, benefits and alternatives for the proposed anesthesia with the patient or authorized representative who has indicated his/her understanding and acceptance.   Dental Advisory Given  Plan Discussed with: Anesthesiologist, CRNA and Surgeon  Anesthesia Plan Comments:         Anesthesia Quick Evaluation

## 2017-03-25 NOTE — Progress Notes (Signed)
[place] [facility] [place] NAME: Morgan [name]    MR#:  [id number]  DATE OF BIRTH:  1935-08-31  SUBJECTIVE:  CHIEF COMPLAINT:   Chief Complaint  Patient presents with  . Anemia  . dark stool   Generalized weakness REVIEW OF SYSTEMS:  Review of Systems  Constitutional: Positive for malaise/fatigue. Negative for chills and fever.  HENT: Negative for congestion.   Eyes: Negative for blurred vision and double vision.  Respiratory: Negative for cough, shortness of breath and stridor.   Cardiovascular: Negative for chest pain and leg swelling.  Gastrointestinal: Positive for melena. Negative for abdominal pain, blood in stool, diarrhea, nausea and vomiting.  Genitourinary: Negative for dysuria and hematuria.  Musculoskeletal: Negative for back pain.  Neurological: Positive for weakness. Negative for dizziness, focal weakness, loss of consciousness and headaches.  Psychiatric/Behavioral: Negative for depression. The patient is not nervous/anxious.     DRUG ALLERGIES:   Allergies  Allergen Reactions  . Aspirin Other (See Comments)    bleeding  . Atenolol Other (See Comments)    fatigue  . Clopidogrel Bisulfate Other (See Comments)    bleeding  . Darvon [Propoxyphene] Hives and Other (See Comments)  . Hydrochlorothiazide Other (See Comments)    Fatigue and cramps  . Iodinated Diagnostic Agents Other (See Comments)    Questionable GI bleed  . Nylon Hives  . Other Other (See Comments)    Nylon sutures  . Pravastatin Other (See Comments)    Severe cramping   VITALS:  Blood pressure (!) 145/57, pulse 66, temperature 97.8 F (36.6 C), temperature source Oral, resp. rate (!) 22, height '5\' 3"'$  (1.6 m), weight 153 lb (69.4 kg), SpO2 95 %. PHYSICAL EXAMINATION:  Physical Exam  Constitutional: She is oriented to person, place, and time and well-developed, well-nourished, and in no distress.  HENT:  Head: Normocephalic.  Mouth/Throat:  Oropharynx is clear and moist.  Eyes: Conjunctivae and EOM are normal.  Neck: Normal range of motion. Neck supple. No JVD present. No tracheal deviation present. No thyromegaly present.  Cardiovascular: Normal rate, regular rhythm and normal heart sounds.  Exam reveals no gallop.   No murmur heard. Pulmonary/Chest: Effort normal and breath sounds normal. No respiratory distress. She has no wheezes. She has no rales.  Abdominal: Soft. Bowel sounds are normal. She exhibits no distension. There is no tenderness.  Musculoskeletal: Normal range of motion. She exhibits no edema or tenderness.  Neurological: She is alert and oriented to person, place, and time. No cranial nerve deficit.  Skin: No rash noted. No erythema.  Psychiatric: Affect normal.   LABORATORY PANEL:  Female CBC  Recent Labs Lab 03/25/17 0640  WBC 6.1  HGB 9.5*  HCT 27.0*  PLT 110*   ------------------------------------------------------------------------------------------------------------------ Chemistries   Recent Labs Lab 03/25/17 0640  NA 138  K 3.6  CL 104  CO2 27  GLUCOSE 189*  BUN 69*  CREATININE 2.14*  CALCIUM 8.4*   RADIOLOGY:  No results found. ASSESSMENT AND PLAN:   82 year old female with known history of coronary artery disease, CHF, and AVMs is being admitted for severe anemia  * Severe blood loss anemia - Likely acute on chronic, Status post 3 units PRBC transfusion. Hemoglobin increased from 5.6 to 9.5.  * Acute GI bleed - protonix IV bid for now  - Platelets are 127, puts her at high risk for bleeding - Hold aspirin, Plavix or any other blood thinner. EGD showed gastritis. Colonoscopy tomorrow per GI.  *  Diabetes - Check hemoglobin A1c - Started her on sliding scale insulin and monitor blood sugar  * Acute on Chronic kidney disease stage 4. Baseline creatinine 1.64/GFR 29 on 09/26/2016 Better with IVF. Avoid nephrotoxin  Generalized weakness. PT evaluation Recurrent GI  bleeding and anemia with multiple blood transfusion and multiple medical problems. Patient would like to be DO NOT RESUSCITATE and will discuss with her son. Palliative care consult for code status.  All the records are reviewed and case discussed with Care Management/Social Worker. Management plans discussed with the patient, her sister and they are in agreement.  CODE STATUS: Full Code  TOTAL TIME TAKING CARE OF THIS PATIENT: 35 minutes.   More than 50% of the time was spent in counseling/coordination of care: YES  POSSIBLE D/C IN 1-2 DAYS, DEPENDING ON CLINICAL CONDITION.   Demetrios Loll M.D on 03/25/2017 at 1:19 PM  Between 7am to 6pm - Pager - [phone]  After 6pm go to [email or web address] - Proofreader  [facility]  Office  [phone]  CC: Primary care physician; Kirk Ruths., MD  Note: This dictation was prepared with Dragon dictation along with smaller phrase technology. Any transcriptional errors that result from this process are unintentional.

## 2017-03-25 NOTE — Progress Notes (Signed)
[facility]  Patient Status: Active  [facility] is providing the following services: SN/PT  If patient discharges after hours, please call [phone].   Marilynne Drivers [place] 03/25/2017, 8:45 AM

## 2017-03-25 NOTE — Anesthesia Postprocedure Evaluation (Signed)
Anesthesia Post Note  Patient: Morgan [name]  Procedure(s) Performed: Procedure(s) (LRB): ESOPHAGOGASTRODUODENOSCOPY (EGD) WITH PROPOFOL (N/A)  Patient location during evaluation: Endoscopy Anesthesia Type: General Level of consciousness: awake and alert Pain management: pain level controlled Vital Signs Assessment: post-procedure vital signs reviewed and stable Respiratory status: spontaneous breathing, nonlabored ventilation, respiratory function stable and patient connected to nasal cannula oxygen Cardiovascular status: blood pressure returned to baseline and stable Postop Assessment: no signs of nausea or vomiting Anesthetic complications: no     Last Vitals:  Vitals:   03/25/17 0940 03/25/17 0950  BP: (!) 139/53 (!) 145/57  Pulse: 67 65  Resp: 16 (!) 23  Temp:      Last Pain:  Vitals:   03/25/17 0900  TempSrc: Tympanic  PainSc:                  Martha Clan

## 2017-03-25 NOTE — Progress Notes (Signed)
Hgb of 7.3 called to Dr. Ara Kussmaul.

## 2017-03-25 NOTE — Anesthesia Post-op Follow-up Note (Signed)
Anesthesia QCDR form completed.        

## 2017-03-26 ENCOUNTER — Inpatient Hospital Stay: Payer: Medicare Other | Admitting: Registered Nurse

## 2017-03-26 ENCOUNTER — Encounter
Admission: EM | Disposition: A | Discharge status: Home Health | Payer: Self-pay | Source: Home / Self Care | Attending: Internal Medicine

## 2017-03-26 ENCOUNTER — Encounter: Payer: Self-pay | Admitting: Gastroenterology

## 2017-03-26 DIAGNOSIS — K640 First degree hemorrhoids: Secondary | ICD-10-CM

## 2017-03-26 DIAGNOSIS — K922 Gastrointestinal hemorrhage, unspecified: Secondary | ICD-10-CM

## 2017-03-26 DIAGNOSIS — D126 Benign neoplasm of colon, unspecified: Secondary | ICD-10-CM

## 2017-03-26 HISTORY — PX: COLONOSCOPY WITH PROPOFOL: SHX5780

## 2017-03-26 LAB — TYPE AND SCREEN
ABO/RH(D): B POS
ANTIBODY SCREEN: NEGATIVE
UNIT DIVISION: 0
Unit division: 0
Unit division: 0

## 2017-03-26 LAB — HEMOGLOBIN A1C
HEMOGLOBIN A1C: 6 % — AB (ref 4.8–5.6)
Mean Plasma Glucose: 126 mg/dL

## 2017-03-26 LAB — BPAM RBC
BLOOD PRODUCT EXPIRATION DATE: 201805022359
Blood Product Expiration Date: 201804112359
Blood Product Expiration Date: 201804242359
ISSUE DATE / TIME: 201804021749
ISSUE DATE / TIME: 201804021441
ISSUE DATE / TIME: 201804030403
UNIT TYPE AND RH: 7300
Unit Type and Rh: 5100
Unit Type and Rh: 9500

## 2017-03-26 LAB — BASIC METABOLIC PANEL
Anion gap: 7 (ref 5–15)
BUN: 56 mg/dL — ABNORMAL HIGH (ref 6–20)
CALCIUM: 8 mg/dL — AB (ref 8.9–10.3)
CO2: 25 mmol/L (ref 22–32)
Chloride: 108 mmol/L (ref 101–111)
Creatinine, Ser: 2.01 mg/dL — ABNORMAL HIGH (ref 0.44–1.00)
GFR, EST AFRICAN AMERICAN: 26 mL/min — AB (ref >60)
GFR, EST NON AFRICAN AMERICAN: 22 mL/min — AB (ref >60)
GLUCOSE: 138 mg/dL — AB (ref 65–99)
Potassium: 3.3 mmol/L — ABNORMAL LOW (ref 3.5–5.1)
SODIUM: 140 mmol/L (ref 135–145)

## 2017-03-26 LAB — HEMOGLOBIN: HEMOGLOBIN: 9.1 g/dL — AB (ref 12.0–16.0)

## 2017-03-26 LAB — MAGNESIUM: MAGNESIUM: 1.3 mg/dL — AB (ref 1.7–2.4)

## 2017-03-26 LAB — GLUCOSE, CAPILLARY: Glucose-Capillary: 151 mg/dL — ABNORMAL HIGH (ref 65–99)

## 2017-03-26 SURGERY — COLONOSCOPY WITH PROPOFOL
Anesthesia: General

## 2017-03-26 MED ORDER — MIDAZOLAM HCL 2 MG/2ML IJ SOLN
INTRAMUSCULAR | Status: AC
  Filled 2017-03-26: qty 2

## 2017-03-26 MED ORDER — EPHEDRINE SULFATE 50 MG/ML IJ SOLN
INTRAMUSCULAR | Status: DC | PRN
  Administered 2017-03-26: 5 mg via INTRAVENOUS

## 2017-03-26 MED ORDER — LIDOCAINE HCL (PF) 2 % IJ SOLN
INTRAMUSCULAR | Status: AC
  Filled 2017-03-26: qty 2

## 2017-03-26 MED ORDER — PROPOFOL 500 MG/50ML IV EMUL
INTRAVENOUS | Status: AC
  Filled 2017-03-26: qty 50

## 2017-03-26 MED ORDER — MIDAZOLAM HCL 2 MG/2ML IJ SOLN
INTRAMUSCULAR | Status: DC | PRN
  Administered 2017-03-26: 1 mg via INTRAVENOUS

## 2017-03-26 MED ORDER — POTASSIUM CHLORIDE IN NACL 40-0.9 MEQ/L-% IV SOLN
INTRAVENOUS | Status: DC
  Filled 2017-03-26: qty 1000

## 2017-03-26 MED ORDER — SODIUM CHLORIDE 0.9 % IV SOLN
INTRAVENOUS | Status: DC | PRN
  Administered 2017-03-26: 07:00:00 via INTRAVENOUS

## 2017-03-26 MED ORDER — PROPOFOL 10 MG/ML IV BOLUS
INTRAVENOUS | Status: DC | PRN
  Administered 2017-03-26: 20 mg via INTRAVENOUS
  Administered 2017-03-26 (×2): 10 mg via INTRAVENOUS

## 2017-03-26 MED ORDER — PROPOFOL 500 MG/50ML IV EMUL
INTRAVENOUS | Status: DC | PRN
  Administered 2017-03-26: 70 ug/kg/min via INTRAVENOUS

## 2017-03-26 MED ORDER — PHENYLEPHRINE HCL 10 MG/ML IJ SOLN
INTRAMUSCULAR | Status: AC
  Filled 2017-03-26: qty 1

## 2017-03-26 NOTE — Progress Notes (Signed)
Discharged patient home with son and sister, discharge summary reviewed with patient including medications, follow up appointments and accessing my chart.  Port cath has been de-accessed.

## 2017-03-26 NOTE — Discharge Instructions (Signed)
Heart healthy diet. Hold ASA and plavix due to GI bleeding, follow up Dr. Vicente Males.

## 2017-03-26 NOTE — Discharge Summary (Signed)
[place] [facility] [place] NAME: Morgan [name]    MR#:  [id number]  DATE OF BIRTH:  06-05-1935  DATE OF ADMISSION:  03/24/2017   ADMITTING PHYSICIAN: Max Sane, MD  DATE OF DISCHARGE: 03/26/2017 11:46 AM  PRIMARY CARE PHYSICIAN: Kirk Ruths., MD   ADMISSION DIAGNOSIS:  Heme positive stool [R19.5] Anemia due to blood loss [D50.0] DISCHARGE DIAGNOSIS:  Active Problems:   Acute GI bleeding   Heme positive stool   DNR (do not resuscitate) discussion Acute GI bleeding Severe blood loss anemia due to GI bleeding. Diverticulosis in the sigmoid colon. SECONDARY DIAGNOSIS:   Past Medical History:  Diagnosis Date  . Abnormal LFTs (liver function tests)   . Adenomatous polyps 03/05/2012  . Angiodysplasia   . Aortic stenosis   . Arthritis   . AV malformation of GI tract   . Bilateral cellulitis of lower leg   . CAD (coronary artery disease)   . Cardiomyopathy ([place])   . Carotid bruit   . Cervical cancer ([place])    cervical cancer history  . CHF (congestive heart failure) ([place])   . Chronic edema   . Clostridium difficile infection March 02, 2015  . Complication of anesthesia     I have trouble waking up "  . COPD (chronic obstructive pulmonary disease) ([place])   . Diabetes mellitus with stage 3 chronic kidney disease ([place])   . Fatty liver   . Fibrocystic breast changes   . GERD (gastroesophageal reflux disease)   . GI bleed   . Gout   . History of blood transfusion   . History of nicotine use   . Hyperlipidemia   . Hypertension   . Iron deficiency anemia 2008  . Irritable bowel syndrome   . Macrocytosis   . Osteoarthritis   . Peptic ulcer disease   . PVD (peripheral vascular disease) ([place])   . Tobacco abuse   . Trigeminal neuralgia   . Vascular malformation 06/14/1997   HOSPITAL COURSE:   82 year old female with known history of coronary artery disease, CHF, and AVMs is being admitted for severe anemia  * Severe blood  loss anemia - Likely acute on chronic, Status post 3 units PRBC transfusion. Hemoglobin increased from 5.6 to 9.5. Hemoglobin is stable at 9.1.  * Acute GI bleed - protonix IV bid for now  - Platelets are 127, puts her at high risk for bleeding - Holdaspirin, Plavix or any otherblood thinner. EGD showed gastritis. Colonoscopy: Diverticulosis in the sigmoid colon.Non-bleeding internal hemorrhoids. Multiple 5 to 10 mm polyps in the entire colon. Follow-up with Dr. and as outpatient.  * Diabetes - Check hemoglobin A1c - Started her on sliding scale insulin and monitor blood sugar  * Acute on Chronic kidney disease stage 4. Baseline creatinine 1.64/GFR 29 on 09/26/2016 Better with IVF. Avoid nephrotoxin  Generalized weakness. Resume home PT.  DISCHARGE CONDITIONS:  Stable, discharge to home with home health and PT today. CONSULTS OBTAINED:  Treatment Team:  Lucilla Lame, MD DRUG ALLERGIES:   Allergies  Allergen Reactions  . Aspirin Other (See Comments)    bleeding  . Atenolol Other (See Comments)    fatigue  . Clopidogrel Bisulfate Other (See Comments)    bleeding  . Darvon [Propoxyphene] Hives and Other (See Comments)  . Hydrochlorothiazide Other (See Comments)    Fatigue and cramps  . Iodinated Diagnostic Agents Other (See Comments)    Questionable GI bleed  . Nylon Hives  . Other  Other (See Comments)    Nylon sutures  . Pravastatin Other (See Comments)    Severe cramping   DISCHARGE MEDICATIONS:   Allergies as of 03/26/2017      Reactions   Aspirin Other (See Comments)   bleeding   Atenolol Other (See Comments)   fatigue   Clopidogrel Bisulfate Other (See Comments)   bleeding   Darvon [propoxyphene] Hives, Other (See Comments)   Hydrochlorothiazide Other (See Comments)   Fatigue and cramps   Iodinated Diagnostic Agents Other (See Comments)   Questionable GI bleed   Nylon Hives   Other Other (See Comments)   Nylon sutures   Pravastatin Other (See  Comments)   Severe cramping      Medication List    STOP taking these medications   aspirin 81 MG chewable tablet   clopidogrel 75 MG tablet Commonly known as:  PLAVIX   diclofenac sodium 1 % Gel Commonly known as:  VOLTAREN     TAKE these medications   acetaminophen 500 MG tablet Commonly known as:  TYLENOL Take 500 mg by mouth at bedtime.   amiodarone 200 MG tablet Commonly known as:  PACERONE Take 1 tablet (200 mg total) by mouth 2 (two) times daily. What changed:  when to take this   amLODipine 2.5 MG tablet Commonly known as:  NORVASC Take 1 tablet (2.5 mg total) by mouth daily.   atorvastatin 40 MG tablet Commonly known as:  LIPITOR Take 1 tablet (40 mg total) by mouth daily at 6 PM.   carvedilol 6.25 MG tablet Commonly known as:  COREG Take 6.25 mg by mouth 2 (two) times daily with a meal.   lisinopril 40 MG tablet Commonly known as:  PRINIVIL,ZESTRIL Take 40 mg by mouth daily.   metolazone 10 MG tablet Commonly known as:  ZAROXOLYN Take 10 mg by mouth daily as needed. For fluid/swelling   metoprolol succinate 50 MG 24 hr tablet Commonly known as:  TOPROL-XL Take 50 mg by mouth daily.   pantoprazole 40 MG tablet Commonly known as:  PROTONIX Take 40 mg by mouth daily.   potassium chloride 10 MEQ tablet Commonly known as:  K-DUR Take 10 mEq by mouth 2 (two) times daily.   spironolactone 25 MG tablet Commonly known as:  ALDACTONE Take 25 mg by mouth daily.   torsemide 100 MG tablet Commonly known as:  DEMADEX Take 100 mg by mouth daily.        DISCHARGE INSTRUCTIONS:  See AVS.  If you experience worsening of your admission symptoms, develop shortness of breath, life threatening emergency, suicidal or homicidal thoughts you must seek medical attention immediately by calling 911 or calling your MD immediately  if symptoms less severe.  You Must read complete instructions/literature along with all the possible adverse reactions/side effects  for all the Medicines you take and that have been prescribed to you. Take any new Medicines after you have completely understood and accpet all the possible adverse reactions/side effects.   Please note  You were cared for by a hospitalist during your hospital stay. If you have any questions about your discharge medications or the care you received while you were in the hospital after you are discharged, you can call the unit and asked to speak with the hospitalist on call if the hospitalist that took care of you is not available. Once you are discharged, your primary care physician will handle any further medical issues. Please note that NO REFILLS for any discharge medications will  be authorized once you are discharged, as it is imperative that you return to your primary care physician (or establish a relationship with a primary care physician if you do not have one) for your aftercare needs so that they can reassess your need for medications and monitor your lab values.    On the day of Discharge:  VITAL SIGNS:  Blood pressure (!) 150/44, pulse (!) 59, temperature 97.6 F (36.4 C), temperature source Oral, resp. rate 19, height '5\' 3"'$  (1.6 m), weight 161 lb 1.6 oz (73.1 kg), SpO2 95 %. PHYSICAL EXAMINATION:  GENERAL:  82 y.o.-year-old patient lying in the bed with no acute distress.  EYES: Pupils equal, round, reactive to light and accommodation. No scleral icterus. Extraocular muscles intact.  HEENT: Head atraumatic, normocephalic. Oropharynx and nasopharynx clear.  NECK:  Supple, no jugular venous distention. No thyroid enlargement, no tenderness.  LUNGS: Normal breath sounds bilaterally, no wheezing, rales,rhonchi or crepitation. No use of accessory muscles of respiration.  CARDIOVASCULAR: S1, S2 normal. No murmurs, rubs, or gallops.  ABDOMEN: Soft, non-tender, non-distended. Bowel sounds present. No organomegaly or mass.  EXTREMITIES: No pedal edema, cyanosis, or clubbing.  NEUROLOGIC:  Cranial nerves II through XII are intact. Muscle strength 5/5 in all extremities. Sensation intact. Gait not checked.  PSYCHIATRIC: The patient is alert and oriented x 3.  SKIN: No obvious rash, lesion, or ulcer.  DATA REVIEW:   CBC  Recent Labs Lab 03/25/17 0640 03/26/17 0341  WBC 6.1  --   HGB 9.5* 9.1*  HCT 27.0*  --   PLT 110*  --     Chemistries   Recent Labs Lab 03/26/17 0341  NA 140  K 3.3*  CL 108  CO2 25  GLUCOSE 138*  BUN 56*  CREATININE 2.01*  CALCIUM 8.0*  MG 1.3*     Microbiology Results  Results for orders placed or performed during the hospital encounter of 12/02/16  MRSA PCR Screening     Status: None   Collection Time: 12/02/16 11:57 AM  Result Value Ref Range Status   MRSA by PCR NEGATIVE NEGATIVE Final    Comment:        The GeneXpert MRSA Assay (FDA approved for NASAL specimens only), is one component of a comprehensive MRSA colonization surveillance program. It is not intended to diagnose MRSA infection nor to guide or monitor treatment for MRSA infections.     RADIOLOGY:  No results found.   Management plans discussed with the patient, Her son and they are in agreement.  CODE STATUS: Prior   TOTAL TIME TAKING CARE OF THIS PATIENT: 33 minutes.    Demetrios Loll M.D on 03/26/2017 at 5:21 PM  Between 7am to 6pm - Pager - [phone]  After 6pm go to [email or web address] - Proofreader  [facility]  Office  [phone]  CC: Primary care physician; Kirk Ruths., MD   Note: This dictation was prepared with Dragon dictation along with smaller phrase technology. Any transcriptional errors that result from this process are unintentional.

## 2017-03-26 NOTE — Anesthesia Post-op Follow-up Note (Cosign Needed)
Anesthesia QCDR form completed.        

## 2017-03-26 NOTE — Op Note (Signed)
[facility] Patient Name: Morgan [name] Procedure Date: 03/26/2017 7:43 AM MRN: [id number] Account #: [id number] Date of Birth: [birth year] Admit Type: Inpatient Age: 82 Room: [facility] ENDO ROOM 4 Gender: Female Note Status: Finalized Procedure:            Colonoscopy Indications:          Melena Providers:            [name] MD, MD Referring MD:         Ocie Cornfield. Ouida Sills MD, MD (Referring MD) Medicines:            Monitored Anesthesia Care Complications:        No immediate complications. Procedure:            Pre-Anesthesia Assessment:                       - Prior to the procedure, a History and Physical was                        performed, and patient medications, allergies and                        sensitivities were reviewed. The patient's tolerance of                        previous anesthesia was reviewed.                       - The risks and benefits of the procedure and the                        sedation options and risks were discussed with the                        patient. All questions were answered and informed                        consent was obtained.                       - ASA Grade Assessment: III - A patient with severe                        systemic disease.                       After obtaining informed consent, the colonoscope was                        passed under direct vision. Throughout the procedure,                        the patient's blood pressure, pulse, and oxygen                        saturations were monitored continuously. The Olympus                        CF-HQ190L Colonoscope (S#. [id number]) was introduced                        through  the anus and advanced to the the cecum,                        identified by the appendiceal orifice, IC valve and                        transillumination. The colonoscopy was somewhat                        difficult due to poor bowel prep with stool present and                         a tortuous colon. The patient tolerated the procedure                        well. The quality of the bowel preparation was poor. Findings:      A few small-mouthed diverticula were found in the sigmoid colon.      Non-bleeding internal hemorrhoids were found during retroflexion. The       hemorrhoids were medium-sized and Grade I (internal hemorrhoids that do       not prolapse).      Multiple semi-sessile polyps were found in the entire colon. The polyps       were 5 to 10 mm in size. Polypectomy was not attempted due to poor prep       , in addition not likely cause of the anemia that she is being evaluated       for presently {skip}.      The exam was otherwise without abnormality on direct and retroflexion       views.      A moderate amount of stool was found in the entire colon, precluding       visualization. Impression:           - Preparation of the colon was poor.                       - Diverticulosis in the sigmoid colon.                       - Non-bleeding internal hemorrhoids.                       - Multiple 5 to 10 mm polyps in the entire colon.                        Resection not attempted.                       - The examination was otherwise normal on direct and                        retroflexion views.                       - Stool in the entire examined colon.                       - No specimens collected. Recommendation:       - Return patient to hospital ward for ongoing care.                       -  Advance diet as tolerated today.                       - Continue present medications.                       - To visualize the small bowel, perform video capsule                        endoscopy in 1 week.                       - - Return to GI office with Dr Allen [place] in 1-2 weeks                        Donell Beers day].                       - At the next office visit need to discuss a repeat                        colonoscopy with a 2 day  prep to excise multiple polyps                        seen which were not taken out today. No source of                        bleeding seen today during the colonoscopy although                        small AVM's may have been missed due to small prep.                        There was no active bleeding in the colon today . Procedure Code(s):    --- Professional ---                       [phone], Colonoscopy, flexible; diagnostic, including                        collection of specimen(s) by brushing or washing, when                        performed (separate procedure) Diagnosis Code(s):    --- Professional ---                       K64.0, First degree hemorrhoids                       D12.6, Benign neoplasm of colon, unspecified                       K92.1, Melena (includes Hematochezia)                       K57.30, Diverticulosis of large intestine without                        perforation or abscess without bleeding CPT copyright 2016 American Medical Association. All rights reserved. The codes documented in this report are preliminary and upon coder  review may  be revised to meet current compliance requirements. [name], MD [name] MD, MD 03/26/2017 8:16:59 AM This report has been signed electronically. Number of Addenda: 0 Note Initiated On: 03/26/2017 7:43 AM Scope Withdrawal Time: 0 hours 9 minutes 57 seconds  Total Procedure Duration: 0 hours 17 minutes 40 seconds       [facility]

## 2017-03-26 NOTE — H&P (Signed)
[name] MD [address], [place], [place] [address] Phone: [phone] Fax : [phone]  Primary Care Physician:  Kirk Ruths., MD Primary Gastroenterologist:  Dr. [name]   Pre-Procedure History & Physical: HPI:  Morgan [name] is a 82 y.o. female is here for an colonoscopy.   Past Medical History:  Diagnosis Date  . Abnormal LFTs (liver function tests)   . Adenomatous polyps 03/05/2012  . Angiodysplasia   . Aortic stenosis   . Arthritis   . AV malformation of GI tract   . Bilateral cellulitis of lower leg   . CAD (coronary artery disease)   . Cardiomyopathy ([place])   . Carotid bruit   . Cervical cancer ([place])    cervical cancer history  . CHF (congestive heart failure) ([place])   . Chronic edema   . Clostridium difficile infection March 02, 2015  . Complication of anesthesia     I have trouble waking up "  . COPD (chronic obstructive pulmonary disease) ([place])   . Diabetes mellitus with stage 3 chronic kidney disease ([place])   . Fatty liver   . Fibrocystic breast changes   . GERD (gastroesophageal reflux disease)   . GI bleed   . Gout   . History of blood transfusion   . History of nicotine use   . Hyperlipidemia   . Hypertension   . Iron deficiency anemia 2008  . Irritable bowel syndrome   . Macrocytosis   . Osteoarthritis   . Peptic ulcer disease   . PVD (peripheral vascular disease) ([place])   . Tobacco abuse   . Trigeminal neuralgia   . Vascular malformation 06/14/1997    Past Surgical History:  Procedure Laterality Date  . ABDOMINAL HYSTERECTOMY    . angiogram cardiac stenting    . BONE MARROW BIOPSY  1999  . CARDIAC CATHETERIZATION N/A 12/02/2016   Procedure: Left Heart Cath and Coronary Angiography;  Surgeon: Wellington Hampshire, MD;  Location: [facility] CV LAB;  Service: Cardiovascular;  Laterality: N/A;  . CARDIAC CATHETERIZATION N/A 12/02/2016   Procedure: Coronary Balloon Angioplasty;  Surgeon: Wellington Hampshire, MD;  Location:  [facility] CV LAB;  Service: Cardiovascular;  Laterality: N/A;  . CHOLECYSTECTOMY    . COLONOSCOPY  12/2014, 2014, 2013, 2008, 2004   03/05/2012-Adenomatous Polyps;09/23/2003-Polyp remains intact;   . COLONOSCOPY N/A 07/12/2016   Procedure: COLONOSCOPY;  Surgeon: Ronald Lobo, MD;  Location: [facility] ENDOSCOPY;  Service: Endoscopy;  Laterality: N/A;  . COLONOSCOPY N/A 08/23/2016   Procedure: COLONOSCOPY;  Surgeon: Lucilla Lame, MD;  Location: [facility] ENDOSCOPY;  Service: Endoscopy;  Laterality: N/A;  . Endoscopic Carpal Tunnel Release    . ESOPHAGOGASTRODUODENOSCOPY  12/2014, 2014, 2013, 2009, 2004  . ESOPHAGOGASTRODUODENOSCOPY (EGD) WITH PROPOFOL N/A 07/12/2016   Procedure: ESOPHAGOGASTRODUODENOSCOPY (EGD) WITH PROPOFOL;  Surgeon: Ronald Lobo, MD;  Location: [place];  Service: Endoscopy;  Laterality: N/A;  . ESOPHAGOGASTRODUODENOSCOPY (EGD) WITH PROPOFOL N/A 08/23/2016   Procedure: ESOPHAGOGASTRODUODENOSCOPY (EGD) WITH PROPOFOL;  Surgeon: Lucilla Lame, MD;  Location: [facility] ENDOSCOPY;  Service: Endoscopy;  Laterality: N/A;  . HOT HEMOSTASIS N/A 07/12/2016   Procedure: HOT HEMOSTASIS (ARGON PLASMA COAGULATION/BICAP);  Surgeon: Ronald Lobo, MD;  Location: [facility] ENDOSCOPY;  Service: Endoscopy;  Laterality: N/A;  . NASAL SINUS SURGERY    . TEE WITHOUT CARDIOVERSION N/A 12/19/2015   Procedure: TRANSESOPHAGEAL ECHOCARDIOGRAM (TEE);  Surgeon: Yolonda Kida, MD;  Location: [facility] ORS;  Service: Cardiovascular;  Laterality: N/A;  . THORACENTESIS    . TONSILLECTOMY  Prior to Admission medications   Medication Sig Start Date End Date Taking? Authorizing Provider  acetaminophen (TYLENOL) 500 MG tablet Take 500 mg by mouth at bedtime.   Yes Historical Provider, MD  amiodarone (PACERONE) 200 MG tablet Take 1 tablet (200 mg total) by mouth 2 (two) times daily. Patient taking differently: Take 200 mg by mouth daily.  12/06/16  Yes Vipul Manuella Ghazi, MD  amLODipine (NORVASC) 2.5 MG tablet Take 1 tablet (2.5 mg  total) by mouth daily. 08/24/16  Yes Srikar Sudini, MD  aspirin 81 MG chewable tablet Chew 1 tablet (81 mg total) by mouth daily. 12/06/16  Yes Vipul Manuella Ghazi, MD  atorvastatin (LIPITOR) 40 MG tablet Take 1 tablet (40 mg total) by mouth daily at 6 PM. 12/06/16  Yes Vipul Manuella Ghazi, MD  carvedilol (COREG) 6.25 MG tablet Take 6.25 mg by mouth 2 (two) times daily with a meal.   Yes Historical Provider, MD  pantoprazole (PROTONIX) 40 MG tablet Take 40 mg by mouth daily.    Yes Historical Provider, MD  spironolactone (ALDACTONE) 25 MG tablet Take 25 mg by mouth daily.   Yes Historical Provider, MD  torsemide (DEMADEX) 100 MG tablet Take 100 mg by mouth daily.   Yes Historical Provider, MD  clopidogrel (PLAVIX) 75 MG tablet Take 1 tablet (75 mg total) by mouth daily with breakfast. Patient not taking: Reported on 03/24/2017 12/06/16   Max Sane, MD  diclofenac sodium (VOLTAREN) 1 % GEL Apply 2 g topically 4 (four) times daily as needed. For pain    Historical Provider, MD  lisinopril (PRINIVIL,ZESTRIL) 40 MG tablet Take 40 mg by mouth daily.    Historical Provider, MD  metolazone (ZAROXOLYN) 10 MG tablet Take 10 mg by mouth daily as needed. For fluid/swelling    Historical Provider, MD  metoprolol succinate (TOPROL-XL) 50 MG 24 hr tablet Take 50 mg by mouth daily.     Historical Provider, MD  potassium chloride (K-DUR) 10 MEQ tablet Take 10 mEq by mouth 2 (two) times daily.    Historical Provider, MD    Allergies as of 03/24/2017 - Review Complete 03/24/2017  Allergen Reaction Noted  . Aspirin Other (See Comments) 11/08/2015  . Atenolol Other (See Comments) 05/19/2015  . Clopidogrel bisulfate Other (See Comments) 11/08/2015  . Darvon [propoxyphene] Hives and Other (See Comments) 04/25/2015  . Hydrochlorothiazide Other (See Comments) 05/19/2015  . Iodinated diagnostic agents Other (See Comments) 05/19/2015  . Nylon Hives 04/25/2015  . Other Other (See Comments) 05/19/2015  . Pravastatin Other (See Comments)  05/19/2015    Family History  Problem Relation Age of Onset  . Parkinsonism Mother   . Heart attack Mother   . Lung cancer Father   . Diabetes Mellitus II Father   . Asthma Sister   . Asthma Brother   . Heart disease Son     Social History   Social History  . Marital status: Married    Spouse name: N/A  . Number of children: N/A  . Years of education: N/A   Occupational History  . Not on file.   Social History Main Topics  . Smoking status: Former Smoker    Packs/day: 0.50    Years: 56.00    Types: Cigarettes    Quit date: 05/23/2013  . Smokeless tobacco: Never Used  . Alcohol use No  . Drug use: No  . Sexual activity: No   Other Topics Concern  . Not on file   Social History Narrative  . No narrative  on file    Review of Systems: See HPI, otherwise negative ROS  Physical Exam: BP (!) 142/49 (BP Location: Left Arm)   Pulse (!) 52   Temp 98 F (36.7 C) (Oral)   Resp 18   Ht 5' 3"  (1.6 m)   Wt 161 lb 1.6 oz (73.1 kg)   SpO2 98%   BMI 28.54 kg/m  General:   Alert,  pleasant and cooperative in NAD Head:  Normocephalic and atraumatic. Neck:  Supple; no masses or thyromegaly. Lungs:  Clear throughout to auscultation.    Heart:  Regular rate and rhythm. Abdomen:  Soft, nontender and nondistended. Normal bowel sounds, without guarding, and without rebound.   Neurologic:  Alert and  oriented x4;  grossly normal neurologically.  Impression/Plan: Morgan [name] is here for an colonoscopy to be performed for severe symptomatic anemia   Risks, benefits, limitations, and alternatives regarding  colonoscopy have been reviewed with the patient.  Questions have been answered.  All parties agreeable.   [name], MD  03/26/2017, 7:26 AM

## 2017-03-26 NOTE — Transfer of Care (Signed)
Immediate Anesthesia Transfer of Care Note  Patient: Morgan [name]  Procedure(s) Performed: Procedure(s): COLONOSCOPY WITH PROPOFOL (N/A)  Patient Location: PACU  Anesthesia Type:General  Level of Consciousness: sedated  Airway & Oxygen Therapy: Patient Spontanous Breathing and Patient connected to nasal cannula oxygen  Post-op Assessment: Report given to RN and Post -op Vital signs reviewed and stable  Post vital signs: Reviewed and stable  Last Vitals:  Vitals:   03/26/17 0810 03/26/17 0816  BP: (!) 111/50 (!) 111/50  Pulse: (!) 57 (!) 57  Resp: 18 20  Temp: (!) 35.8 C (!) 35.8 C    Last Pain:  Vitals:   03/26/17 0816  TempSrc: Temporal  PainSc:          Complications: No apparent anesthesia complications

## 2017-03-26 NOTE — Anesthesia Preprocedure Evaluation (Signed)
Anesthesia Evaluation  Patient identified by MRN, date of birth, ID band Patient awake    Reviewed: Allergy & Precautions, H&P , NPO status , Patient's Chart, lab work & pertinent test results, reviewed documented beta blocker date and time   History of Anesthesia Complications (+) history of anesthetic complications  Airway Mallampati: II   Neck ROM: full    Dental  (+) Poor Dentition   Pulmonary neg pulmonary ROS, COPD, former smoker,    Pulmonary exam normal        Cardiovascular hypertension, + CAD, + Past MI, + Peripheral Vascular Disease and +CHF  negative cardio ROS Normal cardiovascular exam Rhythm:regular Rate:Normal     Neuro/Psych  Neuromuscular disease negative neurological ROS  negative psych ROS   GI/Hepatic negative GI ROS, Neg liver ROS, PUD, GERD  Medicated,  Endo/Other  negative endocrine ROSdiabetes  Renal/GU Renal diseasenegative Renal ROS  negative genitourinary   Musculoskeletal   Abdominal   Peds  Hematology negative hematology ROS (+) anemia ,   Anesthesia Other Findings Past Medical History: No date: Abnormal LFTs (liver function tests) 03/05/2012: Adenomatous polyps No date: Angiodysplasia No date: Aortic stenosis No date: Arthritis No date: AV malformation of GI tract No date: Bilateral cellulitis of lower leg No date: CAD (coronary artery disease) No date: Cardiomyopathy  [facility]) No date: Carotid bruit No date: Cervical cancer (HCC)     Comment: cervical cancer history No date: CHF (congestive heart failure) (HCC) No date: Chronic edema March 02, 2015: Clostridium difficile infection No date: Complication of anesthesia     Comment:  I have trouble waking up " No date: COPD (chronic obstructive pulmonary disease) (* No date: Diabetes mellitus with stage 3 chronic kidney * No date: Fatty liver No date: Fibrocystic breast changes No date: GERD (gastroesophageal reflux  disease) No date: GI bleed No date: Gout No date: History of blood transfusion No date: History of nicotine use No date: Hyperlipidemia No date: Hypertension 2008: Iron deficiency anemia No date: Irritable bowel syndrome No date: Macrocytosis No date: Osteoarthritis No date: Peptic ulcer disease No date: PVD (peripheral vascular disease) ([place]) No date: Tobacco abuse No date: Trigeminal neuralgia 06/14/1997: Vascular malformation Past Surgical History: No date: ABDOMINAL HYSTERECTOMY No date: angiogram cardiac stenting 1999: BONE MARROW BIOPSY 12/02/2016: CARDIAC CATHETERIZATION N/A     Comment: Procedure: Left Heart Cath and Coronary               Angiography;  Surgeon: Wellington Hampshire, MD;                Location: [place] [facility];  Service:               Cardiovascular;  Laterality: N/A; 12/02/2016: CARDIAC CATHETERIZATION N/A     Comment: Procedure: Coronary Balloon Angioplasty;                Surgeon: Wellington Hampshire, MD;  Location: [place] [facility];  Service: Cardiovascular;                Laterality: N/A; No date: CHOLECYSTECTOMY 12/2014, 2014, 2013, 2008, 2004: COLONOSCOPY     Comment: 03/05/2012-Adenomatous Polyps;09/23/2003-Polyp              remains intact;  07/12/2016: COLONOSCOPY N/A     Comment: Procedure: COLONOSCOPY;  Surgeon: Ronald Lobo, MD;  Location: [facility];  Service:              Endoscopy;  Laterality: N/A; 08/23/2016: COLONOSCOPY N/A     Comment: Procedure: COLONOSCOPY;  Surgeon: Lucilla Lame,              MD;  Location: [facility] ENDOSCOPY;  Service:               Endoscopy;  Laterality: N/A; No date: Endoscopic Carpal Tunnel Release 12/2014, 2014, 2013, 2009, 2004: ESOPHAGOGASTRODUODENOSCOPY 07/12/2016: ESOPHAGOGASTRODUODENOSCOPY (EGD) WITH PROPOFOL N/A     Comment: Procedure: ESOPHAGOGASTRODUODENOSCOPY (EGD)               WITH PROPOFOL;  Surgeon: Ronald Lobo, MD;                Location: [facility] ENDOSCOPY;   Service: Endoscopy;                Laterality: N/A; 08/23/2016: ESOPHAGOGASTRODUODENOSCOPY (EGD) WITH PROPOFOL N/A     Comment: Procedure: ESOPHAGOGASTRODUODENOSCOPY (EGD)               WITH PROPOFOL;  Surgeon: Lucilla Lame, MD;                Location: [facility] ENDOSCOPY;  Service: Endoscopy;               Laterality: N/A; 07/12/2016: HOT HEMOSTASIS N/A     Comment: Procedure: HOT HEMOSTASIS (ARGON PLASMA               COAGULATION/BICAP);  Surgeon: Ronald Lobo,               MD;  Location: [facility] ENDOSCOPY;  Service:               Endoscopy;  Laterality: N/A; No date: NASAL SINUS SURGERY 12/19/2015: TEE WITHOUT CARDIOVERSION N/A     Comment: Procedure: TRANSESOPHAGEAL ECHOCARDIOGRAM               (TEE);  Surgeon: Yolonda Kida, MD;                Location: [facility] ORS;  Service: Cardiovascular;                Laterality: N/A; No date: THORACENTESIS No date: TONSILLECTOMY BMI    Body Mass Index:  28.54 kg/m     Reproductive/Obstetrics negative OB ROS                             Anesthesia Physical Anesthesia Plan  ASA: IV  Anesthesia Plan: General   Post-op Pain Management:    Induction:   Airway Management Planned:   Additional Equipment:   Intra-op Plan:   Post-operative Plan:   Informed Consent: I have reviewed the patients History and Physical, chart, labs and discussed the procedure including the risks, benefits and alternatives for the proposed anesthesia with the patient or authorized representative who has indicated his/her understanding and acceptance.   Dental Advisory Given  Plan Discussed with: CRNA  Anesthesia Plan Comments:         Anesthesia Quick Evaluation

## 2017-03-26 NOTE — Progress Notes (Addendum)
Notified by [facility] Isaias Cowman of request for Home Palliative services. Colletta Maryland made aware that Ms. Leonhart is a current home Palliative patient. Updated notes to be sent to Home Palliative team for continued services. Thank you. Flo Shanks RN, BSN, [facility] [place], hospital Liaison [phone] c

## 2017-03-26 NOTE — Care Management (Signed)
Patient discharged home today with resumption orders for home health.  Corene Cornea with [facility] notified of discharge.  Santiago Glad with outpatient palliative notified of discharge.  Patient is already followed by palliative in the home.  Patient also has Touch by an [facility] in the home as well.  Son transported at discharge

## 2017-03-26 NOTE — Anesthesia Procedure Notes (Signed)
Date/Time: 03/26/2017 7:47 AM Performed by: Hedda Slade Pre-anesthesia Checklist: Patient identified, Emergency Drugs available, Suction available and Patient being monitored Oxygen Delivery Method: Nasal cannula

## 2017-03-26 NOTE — Progress Notes (Signed)
Daily Progress Note   Patient Name: Morgan [name]       Date: 03/26/2017 DOB: 1935-05-18  Age: 82 y.o. MRN#: [id number] Attending Physician: Demetrios Loll, MD Primary Care Physician: Kirk Ruths., MD Admit Date: 03/24/2017  Reason for Consultation/Follow-up: Establishing goals of care  Subjective: Patient feeling tired but ok after colonoscopy prep.  She reports black stool.    We discussed code status and advanced directives - She wishes to remain a full code. However, if she became very ill and the doctors felt it was highly unlikely she would survive - then she would not want to be coded.  Hence, we reviewed the advance directives paper work together with her sister and son.  They understand her wishes that she would not want to be on life support.  They will complete the advanced directive paper work and bring it back to the hospital.  After the patient left for colonoscopy, I briefly spoke with her family about her heart, lung, and kidney disease as well as her need for many many blood transfusions (approximately 60) since 2014.  Family expressed concern that the patient  Has questions but is to shy to call her doctors to ask.  In particular she has questions about medications.  She relies on her home health nurse to call the doctor for her - this has been helpful.  I suggested Palliative Care may help with that as well.  Assessment: Patient stable for colonoscopy.  Patient's wishes are clear:  Full code at this point, but if clearly at end of life then DNR.   Patient Profile/HPI: 82 y.o.femalewith past medical history of COPD, DM, CHF (EF 30-35), CAD s/p 2 stents, ESRN on PD, GIB secondary to AVMs, Aortic stenosis, and fatty liver diseasewho was admitted on 03/24/17 with severe  symptomatic anemia and melena.  Her hgb was 5.0.  She received 3 units of PRBCs and underwent EGD on 4/3.  The EGD did not show an etiology for bleeding.  She is scheduled to undergo colonoscopy at 7:30 am in the morning.  PMT was consulted for code status.   Length of Stay: 2  Current Medications: Scheduled Meds:  . [MAR Hold] acetaminophen  500 mg Oral QHS  . [MAR Hold] amiodarone  200 mg Oral Daily  . [MAR Hold] amLODipine  2.5  mg Oral Daily  . [MAR Hold] carvedilol  6.25 mg Oral BID WC  . [MAR Hold] docusate sodium  100 mg Oral BID  . [MAR Hold] insulin aspart  0-5 Units Subcutaneous QHS  . [MAR Hold] insulin aspart  0-9 Units Subcutaneous TID WC  . [MAR Hold] metoprolol succinate  50 mg Oral Daily  . [MAR Hold] pantoprazole (PROTONIX) IV  40 mg Intravenous Q12H    Continuous Infusions: . 0.9 % NaCl with KCl 40 mEq / L      PRN Meds: [MAR Hold] acetaminophen **OR** [MAR Hold] acetaminophen, [MAR Hold] bisacodyl, [MAR Hold] ondansetron **OR** [MAR Hold] ondansetron (ZOFRAN) IV, [MAR Hold] traZODone  Physical Exam        Well developed, pleasant, A&O, NAD CV rrr Resp no distress  Vital Signs: BP (!) 148/52   Pulse (!) 57   Temp (!) 96.5 F (35.8 C) (Temporal)   Resp 17   Ht '5\' 3"'$  (1.6 m)   Wt 73.1 kg (161 lb 1.6 oz)   SpO2 100%   BMI 28.54 kg/m  SpO2: SpO2: 100 % O2 Device: O2 Device: Not Delivered O2 Flow Rate: O2 Flow Rate (L/min): 2 L/min  Intake/output summary:  Intake/Output Summary (Last 24 hours) at 03/26/17 0854 Last data filed at 03/26/17 0743  Gross per 24 hour  Intake              719 ml  Output                0 ml  Net              719 ml   LBM: Last BM Date: 03/25/17 Baseline Weight: Weight: 69.4 kg (153 lb) Most recent weight: Weight: 73.1 kg (161 lb 1.6 oz)       Palliative Assessment/Data:    Flowsheet Rows     Most Recent Value  Intake Tab  Referral Department  Hospitalist  Unit at Time of Referral  Cardiac/Telemetry Unit    Palliative Care Primary Diagnosis  Other (Comment)  Date Notified  03/25/17  Palliative Care Type  Return patient Palliative Care  Reason for referral  Clarify Goals of Care  Date of Admission  03/24/17  Date first seen by Palliative Care  03/25/17  # of days Palliative referral response time  0 Day(s)  # of days IP prior to Palliative referral  1  Clinical Assessment  Palliative Performance Scale Score  50%  Psychosocial & Spiritual Assessment  Palliative Care Outcomes  Patient/Family meeting held?  Yes  Who was at the meeting?  patient and son (on the phone)  Palliative Care Outcomes  Clarified goals of care      Patient Active Problem List   Diagnosis Date Noted  . Heme positive stool   . DNR (do not resuscitate) discussion   . Acute GI bleeding 03/24/2017  . Hypotension 12/22/2016  . Hyperkalemia 12/22/2016  . Palliative care encounter   . Goals of care, counseling/discussion   . Pressure injury of skin 12/03/2016  . STEMI involving right coronary artery ([place]) 12/02/2016  . ST elevation myocardial infarction (STEMI) of inferior wall ([place]) 12/02/2016  . Anemia due to stage 3 chronic kidney disease 09/26/2016  . Iron deficiency anemia   . Acute posthemorrhagic anemia   . Blood in stool   . Benign neoplasm of colon   . Angiodysplasia of intestine with hemorrhage   . GIB (gastrointestinal bleeding) 08/22/2016  . AKI (acute kidney injury) ([place]) 08/22/2016  .  Rectal bleeding   . Pressure ulcer 07/10/2016  . Thrombocytopenia ([place]) 07/09/2016  . Anemia of chronic kidney failure 06/24/2016  . Iron deficiency anemia due to chronic blood loss 04/29/2016  . Angiodysplasia 05/19/2015  . CA cervix ([place]) 05/19/2015  . Fatty infiltration of liver 05/19/2015  . Increased MCV 05/19/2015  . Gastroduodenal ulcer 05/19/2015  . Hypercholesterolemia without hypertriglyceridemia 03/06/2015  . Diabetes ([place]) 10/25/2014  . Anemia due to blood loss 06/18/2014  . Aortic heart valve  narrowing 06/18/2014  . 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 Arteriosclerosis of coronary artery 06/18/2014  . Cardiomyopathy ([place]) 06/18/2014  . Chronic kidney disease (CKD), stage III (moderate) 06/18/2014  . CAFL (chronic airflow limitation) ([place]) 06/18/2014  . Type II diabetes mellitus with neurological manifestations ([place]) 06/18/2014  . Gout 06/18/2014  . Benign hypertension 06/18/2014  . Fothergill's neuralgia 06/18/2014    Palliative Care Plan    Recommendations/Plan:  Complete advanced directive paper work  Full code   Continue Palliative care outpatient   Goals of Care and Additional Recommendations:  Limitations on Scope of Treatment: Full Scope Treatment  Code Status:  Full code  Prognosis:   Unable to determine   Discharge Planning:  Home with Home Health and Palliative Care follow up.  Care plan was discussed with son and sister.  Thank you for allowing the Palliative Medicine Team to assist in the care of this patient.  Total time spent:  35 min.     Greater than 50%  of this time was spent counseling and coordinating care related to the above assessment and plan.  Imogene Burn, PA-C Palliative Medicine  Please contact Palliative MedicineTeam phone at [phone] for questions and concerns between 7 am - 7 pm.   Please see AMION for individual provider pager numbers.

## 2017-03-27 ENCOUNTER — Encounter: Payer: Self-pay | Admitting: Gastroenterology

## 2017-03-27 NOTE — Anesthesia Postprocedure Evaluation (Signed)
Anesthesia Post Note  Patient: Morgan [name]  Procedure(s) Performed: Procedure(s) (LRB): COLONOSCOPY WITH PROPOFOL (N/A)  Patient location during evaluation: PACU Anesthesia Type: General and Combined Spinal/Epidural Level of consciousness: awake and alert Pain management: pain level controlled Vital Signs Assessment: post-procedure vital signs reviewed and stable Respiratory status: spontaneous breathing, nonlabored ventilation, respiratory function stable and patient connected to nasal cannula oxygen Cardiovascular status: blood pressure returned to baseline and stable Postop Assessment: no signs of nausea or vomiting Anesthetic complications: no     Last Vitals:  Vitals:   03/26/17 0840 03/26/17 0912  BP: 140/72 (!) 150/44  Pulse: (!) 59 (!) 59  Resp: 19   Temp:  36.4 C    Last Pain:  Vitals:   03/26/17 0912  TempSrc: Oral  PainSc:                  Molli Barrows

## 2017-04-10 ENCOUNTER — Other Ambulatory Visit: Payer: Self-pay | Admitting: *Deleted

## 2017-04-10 DIAGNOSIS — D500 Iron deficiency anemia secondary to blood loss (chronic): Secondary | ICD-10-CM

## 2017-04-11 ENCOUNTER — Inpatient Hospital Stay (HOSPITAL_BASED_OUTPATIENT_CLINIC_OR_DEPARTMENT_OTHER): Payer: Medicare Other | Admitting: Internal Medicine

## 2017-04-11 ENCOUNTER — Inpatient Hospital Stay: Payer: Medicare Other

## 2017-04-11 ENCOUNTER — Encounter: Payer: Self-pay | Admitting: Internal Medicine

## 2017-04-11 VITALS — BP 125/62 | HR 53 | Temp 98.1°F | Wt 173.3 lb

## 2017-04-11 DIAGNOSIS — N183 Chronic kidney disease, stage 3 unspecified: Secondary | ICD-10-CM

## 2017-04-11 DIAGNOSIS — M109 Gout, unspecified: Secondary | ICD-10-CM | POA: Diagnosis not present

## 2017-04-11 DIAGNOSIS — E1122 Type 2 diabetes mellitus with diabetic chronic kidney disease: Secondary | ICD-10-CM | POA: Diagnosis not present

## 2017-04-11 DIAGNOSIS — I130 Hypertensive heart and chronic kidney disease with heart failure and stage 1 through stage 4 chronic kidney disease, or unspecified chronic kidney disease: Secondary | ICD-10-CM | POA: Diagnosis not present

## 2017-04-11 DIAGNOSIS — Z955 Presence of coronary angioplasty implant and graft: Secondary | ICD-10-CM | POA: Diagnosis not present

## 2017-04-11 DIAGNOSIS — D631 Anemia in chronic kidney disease: Secondary | ICD-10-CM | POA: Diagnosis not present

## 2017-04-11 DIAGNOSIS — D696 Thrombocytopenia, unspecified: Secondary | ICD-10-CM

## 2017-04-11 DIAGNOSIS — L03116 Cellulitis of left lower limb: Secondary | ICD-10-CM | POA: Diagnosis not present

## 2017-04-11 DIAGNOSIS — K219 Gastro-esophageal reflux disease without esophagitis: Secondary | ICD-10-CM | POA: Diagnosis not present

## 2017-04-11 DIAGNOSIS — Z8541 Personal history of malignant neoplasm of cervix uteri: Secondary | ICD-10-CM | POA: Diagnosis not present

## 2017-04-11 DIAGNOSIS — J449 Chronic obstructive pulmonary disease, unspecified: Secondary | ICD-10-CM | POA: Diagnosis not present

## 2017-04-11 DIAGNOSIS — Z79899 Other long term (current) drug therapy: Secondary | ICD-10-CM

## 2017-04-11 DIAGNOSIS — I739 Peripheral vascular disease, unspecified: Secondary | ICD-10-CM | POA: Diagnosis not present

## 2017-04-11 DIAGNOSIS — E785 Hyperlipidemia, unspecified: Secondary | ICD-10-CM | POA: Diagnosis not present

## 2017-04-11 DIAGNOSIS — D500 Iron deficiency anemia secondary to blood loss (chronic): Secondary | ICD-10-CM

## 2017-04-11 DIAGNOSIS — K589 Irritable bowel syndrome without diarrhea: Secondary | ICD-10-CM | POA: Diagnosis not present

## 2017-04-11 DIAGNOSIS — I350 Nonrheumatic aortic (valve) stenosis: Secondary | ICD-10-CM | POA: Diagnosis not present

## 2017-04-11 DIAGNOSIS — M1990 Unspecified osteoarthritis, unspecified site: Secondary | ICD-10-CM | POA: Diagnosis not present

## 2017-04-11 LAB — BASIC METABOLIC PANEL
Anion gap: 8 (ref 5–15)
BUN: 41 mg/dL — ABNORMAL HIGH (ref 6–20)
CHLORIDE: 97 mmol/L — AB (ref 101–111)
CO2: 31 mmol/L (ref 22–32)
Calcium: 8.3 mg/dL — ABNORMAL LOW (ref 8.9–10.3)
Creatinine, Ser: 2.22 mg/dL — ABNORMAL HIGH (ref 0.44–1.00)
GFR, EST AFRICAN AMERICAN: 23 mL/min — AB (ref >60)
GFR, EST NON AFRICAN AMERICAN: 20 mL/min — AB (ref >60)
Glucose, Bld: 192 mg/dL — ABNORMAL HIGH (ref 65–99)
POTASSIUM: 4.2 mmol/L (ref 3.5–5.1)
SODIUM: 136 mmol/L (ref 135–145)

## 2017-04-11 LAB — CBC WITH DIFFERENTIAL/PLATELET
Basophils Absolute: 0.1 10*3/uL (ref 0–0.1)
Basophils Relative: 1 %
Eosinophils Absolute: 0.1 10*3/uL (ref 0–0.7)
Eosinophils Relative: 2 %
HEMATOCRIT: 29.9 % — AB (ref 35.0–47.0)
HEMOGLOBIN: 10.5 g/dL — AB (ref 12.0–16.0)
LYMPHS ABS: 0.5 10*3/uL — AB (ref 1.0–3.6)
LYMPHS PCT: 10 %
MCH: 33.2 pg (ref 26.0–34.0)
MCHC: 35 g/dL (ref 32.0–36.0)
MCV: 94.9 fL (ref 80.0–100.0)
MONO ABS: 0.4 10*3/uL (ref 0.2–0.9)
MONOS PCT: 7 %
NEUTROS ABS: 4 10*3/uL (ref 1.4–6.5)
Neutrophils Relative %: 80 %
Platelets: 111 10*3/uL — ABNORMAL LOW (ref 150–440)
RBC: 3.16 MIL/uL — ABNORMAL LOW (ref 3.80–5.20)
RDW: 18.4 % — AB (ref 11.5–14.5)
WBC: 5.1 10*3/uL (ref 3.6–11.0)

## 2017-04-11 LAB — FERRITIN: Ferritin: 117 ng/mL (ref 11–307)

## 2017-04-11 LAB — SAMPLE TO BLOOD BANK

## 2017-04-11 LAB — IRON AND TIBC
Iron: 90 ug/dL (ref 28–170)
Saturation Ratios: 32 % — ABNORMAL HIGH (ref 10.4–31.8)
TIBC: 284 ug/dL (ref 250–450)
UIBC: 194 ug/dL

## 2017-04-11 NOTE — Assessment & Plan Note (Addendum)
#   CHRONIC ANEMIA-chronic kidney disease [Patient declines Procrit.  ]/ secondary to GI bleed/AV malformations; iron deficiency. Recent hemoglobin 5.3 status post repeat EGD colonoscopy negative; question small bowel blood loss. Today hemoglobin 10.3. Hold IV iron transfusion.   # Today hemoglobin is 10.3 ;. Plan IV Feraheme in 2 weeks; then monthly; cbc monthly.   #Follow-up with me in 3 months [facility] BMP/Iron studies/ Ferritin.

## 2017-04-11 NOTE — Progress Notes (Signed)
[facility] OFFICE PROGRESS NOTE  Patient Care Team: Kirk Ruths, MD as PCP - General (Internal Medicine) Cammie Sickle, MD as Consulting Physician (Hematology and Oncology)   SUMMARY OF HEMATOLOGIC HISTORY:  # CHRONIC ANEMIA sec Hx GIB/CKD [ CKD STAGE III]; Jan-2016 [colo/EGD- Dr.Elliot]; capsule x2 - angiodysplasia; BMBx- 1999; IV iron & procrit; July 2017- colo- AVMs in cecum & stomach [s/p [place]on; Dr.Buccini; [place]]; Aug 2017- Declines Procrit.   # CHRONIC MILD THROMBOCYTOPENIA-   # CKD [creat 1.4-1.7]  # CHRONIC CELLULITIS/ AORTIC STENOSIS/ LIMITED MOBILITY   INTERVAL HISTORY:  82 year old female patient with multiple medical problems and above history of chronic anemia from chronic GI blood loss/chronic kidney disease on iron; declined Procrit is here for follow-up.  Patient was recently admitted to the hospital for further evaluation of her severe anemia of hemoglobin 5.3. Patient received 3 units of blood in the hospital. She also underwent EGD colonoscopy. Negative for any obvious bleeding.   Denies any blood in stools. No nausea or vomiting. No fevers or chills.Marland Kitchen   REVIEW OF SYSTEMS:  A complete 10 point review of system is done which is negative except mentioned above/history of present illness.   PAST MEDICAL HISTORY :  Past Medical History:  Diagnosis Date  . Abnormal LFTs (liver function tests)   . Adenomatous polyps 03/05/2012  . Angiodysplasia   . Aortic stenosis   . Arthritis   . AV malformation of GI tract   . Bilateral cellulitis of lower leg   . CAD (coronary artery disease)   . Cardiomyopathy ([place])   . Carotid bruit   . Cervical cancer ([place])    cervical cancer history  . CHF (congestive heart failure) ([place])   . Chronic edema   . Clostridium difficile infection March 02, 2015  . Complication of anesthesia     I have trouble waking up "  . COPD (chronic obstructive pulmonary disease) ([place])   . Diabetes mellitus with stage 3  chronic kidney disease ([place])   . Fatty liver   . Fibrocystic breast changes   . GERD (gastroesophageal reflux disease)   . GI bleed   . Gout   . History of blood transfusion   . History of nicotine use   . Hyperlipidemia   . Hypertension   . Iron deficiency anemia 2008  . Irritable bowel syndrome   . Macrocytosis   . Osteoarthritis   . Peptic ulcer disease   . PVD (peripheral vascular disease) ([place])   . Retinal vein occlusion of left eye   . Tobacco abuse   . Trigeminal neuralgia   . Vascular malformation 06/14/1997    PAST SURGICAL HISTORY :   Past Surgical History:  Procedure Laterality Date  . ABDOMINAL HYSTERECTOMY    . angiogram cardiac stenting    . BONE MARROW BIOPSY  1999  . CARDIAC CATHETERIZATION N/A 12/02/2016   Procedure: Left Heart Cath and Coronary Angiography;  Surgeon: Wellington Hampshire, MD;  Location: [facility];  Service: Cardiovascular;  Laterality: N/A;  . CARDIAC CATHETERIZATION N/A 12/02/2016   Procedure: Coronary Balloon Angioplasty;  Surgeon: Wellington Hampshire, MD;  Location: [facility];  Service: Cardiovascular;  Laterality: N/A;  . CHOLECYSTECTOMY    . COLONOSCOPY  12/2014, 2014, 2013, 2008, 2004   03/05/2012-Adenomatous Polyps;09/23/2003-Polyp remains intact;   . COLONOSCOPY N/A 07/12/2016   Procedure: COLONOSCOPY;  Surgeon: Ronald Lobo, MD;  Location: [facility] ENDOSCOPY;  Service: Endoscopy;  Laterality: N/A;  .  COLONOSCOPY N/A 08/23/2016   Procedure: COLONOSCOPY;  Surgeon: Lucilla Lame, MD;  Location: [facility] ENDOSCOPY;  Service: Endoscopy;  Laterality: N/A;  . COLONOSCOPY WITH PROPOFOL N/A 03/26/2017   Procedure: COLONOSCOPY WITH PROPOFOL;  Surgeon: [name], MD;  Location: [facility] ENDOSCOPY;  Service: Endoscopy;  Laterality: N/A;  . Endoscopic Carpal Tunnel Release    . ESOPHAGOGASTRODUODENOSCOPY  12/2014, 2014, 2013, 2009, 2004  . ESOPHAGOGASTRODUODENOSCOPY (EGD) WITH PROPOFOL N/A 07/12/2016   Procedure: ESOPHAGOGASTRODUODENOSCOPY (EGD)  WITH PROPOFOL;  Surgeon: Ronald Lobo, MD;  Location: [facility];  Service: Endoscopy;  Laterality: N/A;  . ESOPHAGOGASTRODUODENOSCOPY (EGD) WITH PROPOFOL N/A 08/23/2016   Procedure: ESOPHAGOGASTRODUODENOSCOPY (EGD) WITH PROPOFOL;  Surgeon: Lucilla Lame, MD;  Location: [facility] ENDOSCOPY;  Service: Endoscopy;  Laterality: N/A;  . ESOPHAGOGASTRODUODENOSCOPY (EGD) WITH PROPOFOL N/A 03/25/2017   Procedure: ESOPHAGOGASTRODUODENOSCOPY (EGD) WITH PROPOFOL;  Surgeon: Lucilla Lame, MD;  Location: [facility] ENDOSCOPY;  Service: Endoscopy;  Laterality: N/A;  . HOT HEMOSTASIS N/A 07/12/2016   Procedure: HOT HEMOSTASIS (ARGON PLASMA COAGULATION/BICAP);  Surgeon: Ronald Lobo, MD;  Location: [facility] ENDOSCOPY;  Service: Endoscopy;  Laterality: N/A;  . NASAL SINUS SURGERY    . TEE WITHOUT CARDIOVERSION N/A 12/19/2015   Procedure: TRANSESOPHAGEAL ECHOCARDIOGRAM (TEE);  Surgeon: Yolonda Kida, MD;  Location: [facility] ORS;  Service: Cardiovascular;  Laterality: N/A;  . THORACENTESIS    . TONSILLECTOMY      FAMILY HISTORY :   Family History  Problem Relation Age of Onset  . Parkinsonism Mother   . Heart attack Mother   . Lung cancer Father   . Diabetes Mellitus II Father   . Asthma Sister   . Asthma Brother   . Heart disease Son     SOCIAL HISTORY:   Social History  Substance Use Topics  . Smoking status: Former Smoker    Packs/day: 0.50    Years: 56.00    Types: Cigarettes    Quit date: 05/23/2013  . Smokeless tobacco: Never Used  . Alcohol use No    ALLERGIES:  is allergic to aspirin; atenolol; clopidogrel bisulfate; darvon [propoxyphene]; hydrochlorothiazide; iodinated diagnostic agents; nylon; other; and pravastatin.  MEDICATIONS:  Current Outpatient Prescriptions  Medication Sig Dispense Refill  . acetaminophen (TYLENOL) 500 MG tablet Take 500 mg by mouth at bedtime.    Marland Kitchen amiodarone (PACERONE) 100 MG tablet Take 100 mg by mouth daily.    Marland Kitchen amLODipine (NORVASC) 2.5 MG tablet Take 1 tablet (2.5 mg  total) by mouth daily. 30 tablet 0  . atorvastatin (LIPITOR) 40 MG tablet Take 1 tablet (40 mg total) by mouth daily at 6 PM. 30 tablet 0  . carvedilol (COREG) 3.125 MG tablet Take 3.125 mg by mouth 2 (two) times daily.    . pantoprazole (PROTONIX) 40 MG tablet Take 40 mg by mouth daily.     Marland Kitchen spironolactone (ALDACTONE) 25 MG tablet Take 25 mg by mouth daily.    Marland Kitchen torsemide (DEMADEX) 100 MG tablet Take 100 mg by mouth daily.    . diclofenac sodium (VOLTAREN) 1 % GEL Apply topically.     No current facility-administered medications for this visit.    Facility-Administered Medications Ordered in Other Visits  Medication Dose Route Frequency Provider Last Rate Last Dose  . acetaminophen (TYLENOL) tablet 650 mg  650 mg Oral Once Cammie Sickle, MD      . diphenhydrAMINE (BENADRYL) capsule 25 mg  25 mg Oral Once Cammie Sickle, MD      . epoetin alfa (EPOGEN,PROCRIT) injection 20,000 Units  20,000  Units Subcutaneous Once Cammie Sickle, MD      . furosemide (LASIX) injection 20 mg  20 mg Intravenous Once Cammie Sickle, MD      . heparin lock flush 100 unit/mL  500 Units Intracatheter Daily PRN Cammie Sickle, MD      . heparin lock flush 100 unit/mL  250 Units Intracatheter PRN Cammie Sickle, MD      . sodium chloride flush (NS) 0.9 % injection 10 mL  10 mL Intracatheter PRN Cammie Sickle, MD      . sodium chloride flush (NS) 0.9 % injection 3 mL  3 mL Intracatheter PRN Cammie Sickle, MD        PHYSICAL EXAMINATION: ECOG PERFORMANCE STATUS: 2 - Symptomatic, <50% confined to bed  BP 125/62 (BP Location: Left Arm, Patient Position: Sitting)   Pulse (!) 53   Temp 98.1 F (36.7 C) (Tympanic)   Wt 173 lb 5 oz (78.6 kg)   BMI 30.70 kg/m   Filed Weights   04/11/17 1409  Weight: 173 lb 5 oz (78.6 kg)    GENERAL: Well-nourished well-developed; Alert, no distress and comfortable.   She is accompanied by her sister. She is in a wheelchair. She  cannot sit on the exam table.  EYES: Positive for pallor. OROPHARYNX: no thrush or ulceration; good dentition  NECK: supple, no masses felt LYMPH:  no palpable lymphadenopathy in the cervical,  LUNGS: clear to auscultation and  No wheeze or crackles HEART/CVS: regular rate & rhythm and positive for systolic murmurs; bilateral lower extremity swelling ; left larger than the right /chronic venous stasis changes noted.  ABDOMEN:abdomen soft, non-tender and normal bowel sounds Musculoskeletal:no cyanosis of digits and no clubbing  PSYCH: alert & oriented x 3 with fluent speech NEURO: no focal motor/sensory deficits SKIN:  erythema of the left lower extremity /improving.   LABORATORY DATA:  I have reviewed the data as listed    Component Value Date/Time   NA 136 04/11/2017 1331   NA 139 01/18/2015 0545   K 4.2 04/11/2017 1331   K 3.9 01/18/2015 0545   CL 97 (L) 04/11/2017 1331   CL 105 01/18/2015 0545   CO2 31 04/11/2017 1331   CO2 25 01/18/2015 0545   GLUCOSE 192 (H) 04/11/2017 1331   GLUCOSE 179 (H) 01/18/2015 0545   BUN 41 (H) 04/11/2017 1331   BUN 29 (H) 01/18/2015 0545   CREATININE 2.22 (H) 04/11/2017 1331   CREATININE 1.80 (H) 01/18/2015 0545   CALCIUM 8.3 (L) 04/11/2017 1331   CALCIUM 8.7 01/18/2015 0545   PROT 6.9 09/26/2016 1001   PROT 6.6 06/20/2014 1101   ALBUMIN 2.9 (L) 09/26/2016 1001   ALBUMIN 3.1 (L) 06/20/2014 1101   AST 18 09/26/2016 1001   AST 32 06/20/2014 1101   ALT 13 (L) 09/26/2016 1001   ALT 31 06/20/2014 1101   ALKPHOS 125 09/26/2016 1001   ALKPHOS 125 (H) 06/20/2014 1101   BILITOT 0.9 09/26/2016 1001   BILITOT 0.3 06/20/2014 1101   GFRNONAA 20 (L) 04/11/2017 1331   GFRNONAA 29 (L) 01/18/2015 0545   GFRNONAA 28 (L) 06/20/2014 1101   GFRAA 23 (L) 04/11/2017 1331   GFRAA 35 (L) 01/18/2015 0545   GFRAA 33 (L) 06/20/2014 1101    No results found for: SPEP, UPEP  Lab Results  Component Value Date   WBC 5.1 04/11/2017   NEUTROABS 4.0 04/11/2017    HGB 10.5 (L) 04/11/2017   HCT 29.9 (L) 04/11/2017  MCV 94.9 04/11/2017   PLT 111 (L) 04/11/2017      Chemistry      Component Value Date/Time   NA 136 04/11/2017 1331   NA 139 01/18/2015 0545   K 4.2 04/11/2017 1331   K 3.9 01/18/2015 0545   CL 97 (L) 04/11/2017 1331   CL 105 01/18/2015 0545   CO2 31 04/11/2017 1331   CO2 25 01/18/2015 0545   BUN 41 (H) 04/11/2017 1331   BUN 29 (H) 01/18/2015 0545   CREATININE 2.22 (H) 04/11/2017 1331   CREATININE 1.80 (H) 01/18/2015 0545      Component Value Date/Time   CALCIUM 8.3 (L) 04/11/2017 1331   CALCIUM 8.7 01/18/2015 0545   ALKPHOS 125 09/26/2016 1001   ALKPHOS 125 (H) 06/20/2014 1101   AST 18 09/26/2016 1001   AST 32 06/20/2014 1101   ALT 13 (L) 09/26/2016 1001   ALT 31 06/20/2014 1101   BILITOT 0.9 09/26/2016 1001   BILITOT 0.3 06/20/2014 1101        ASSESSMENT & PLAN:   Iron deficiency anemia due to chronic blood loss # CHRONIC ANEMIA-chronic kidney disease [Patient declines Procrit.  ]/ secondary to GI bleed/AV malformations; iron deficiency. Recent hemoglobin 5.3 status post repeat EGD colonoscopy negative; question small bowel blood loss. Today hemoglobin 10.3. Hold IV iron transfusion.   # Today hemoglobin is 10.3 ;. Plan IV Feraheme in 2 weeks; then monthly; cbc monthly.   #Follow-up with me in 3 months [facility] BMP/Iron studies/ Ferritin.      Cammie Sickle, MD 04/13/2017 7:08 PM

## 2017-04-11 NOTE — Progress Notes (Signed)
Patient here today for follow up.   

## 2017-04-17 ENCOUNTER — Encounter: Payer: Self-pay | Admitting: *Deleted

## 2017-04-17 ENCOUNTER — Ambulatory Visit: Payer: Medicare Other | Admitting: Gastroenterology

## 2017-04-25 ENCOUNTER — Inpatient Hospital Stay: Payer: Medicare Other | Attending: Internal Medicine

## 2017-04-25 ENCOUNTER — Other Ambulatory Visit: Payer: Self-pay

## 2017-04-25 ENCOUNTER — Inpatient Hospital Stay: Payer: Medicare Other

## 2017-04-25 VITALS — BP 167/71 | HR 60 | Temp 97.1°F | Resp 18

## 2017-04-25 DIAGNOSIS — D500 Iron deficiency anemia secondary to blood loss (chronic): Secondary | ICD-10-CM

## 2017-04-25 DIAGNOSIS — Z79899 Other long term (current) drug therapy: Secondary | ICD-10-CM | POA: Diagnosis not present

## 2017-04-25 DIAGNOSIS — D631 Anemia in chronic kidney disease: Secondary | ICD-10-CM | POA: Diagnosis not present

## 2017-04-25 DIAGNOSIS — N183 Chronic kidney disease, stage 3 (moderate): Secondary | ICD-10-CM | POA: Diagnosis not present

## 2017-04-25 DIAGNOSIS — I130 Hypertensive heart and chronic kidney disease with heart failure and stage 1 through stage 4 chronic kidney disease, or unspecified chronic kidney disease: Secondary | ICD-10-CM | POA: Diagnosis not present

## 2017-04-25 DIAGNOSIS — D649 Anemia, unspecified: Secondary | ICD-10-CM

## 2017-04-25 LAB — HEMATOCRIT: HCT: 27.7 % — ABNORMAL LOW (ref 35.0–47.0)

## 2017-04-25 LAB — HEMOGLOBIN: Hemoglobin: 9.9 g/dL — ABNORMAL LOW (ref 12.0–16.0)

## 2017-04-25 MED ORDER — SODIUM CHLORIDE 0.9 % IV SOLN
Freq: Once | INTRAVENOUS | Status: AC
  Administered 2017-04-25: 14:00:00 via INTRAVENOUS
  Filled 2017-04-25: qty 1000

## 2017-04-25 MED ORDER — SODIUM CHLORIDE 0.9 % IV SOLN
510.0000 mg | Freq: Once | INTRAVENOUS | Status: AC
  Administered 2017-04-25: 510 mg via INTRAVENOUS
  Filled 2017-04-25: qty 17

## 2017-04-25 MED ORDER — HEPARIN SOD (PORK) LOCK FLUSH 100 UNIT/ML IV SOLN
500.0000 [IU] | Freq: Once | INTRAVENOUS | Status: AC
  Administered 2017-04-25: 500 [IU] via INTRAVENOUS
  Filled 2017-04-25: qty 5

## 2017-04-25 MED ORDER — SODIUM CHLORIDE 0.9% FLUSH
10.0000 mL | INTRAVENOUS | Status: DC | PRN
  Administered 2017-04-25: 10 mL via INTRAVENOUS
  Filled 2017-04-25: qty 10

## 2017-05-23 ENCOUNTER — Inpatient Hospital Stay: Payer: Medicare Other | Attending: Internal Medicine

## 2017-05-23 ENCOUNTER — Inpatient Hospital Stay: Payer: Medicare Other

## 2017-05-23 VITALS — BP 169/67 | HR 61 | Temp 97.8°F | Resp 20

## 2017-05-23 DIAGNOSIS — D500 Iron deficiency anemia secondary to blood loss (chronic): Secondary | ICD-10-CM

## 2017-05-23 DIAGNOSIS — N183 Chronic kidney disease, stage 3 unspecified: Secondary | ICD-10-CM

## 2017-05-23 DIAGNOSIS — D631 Anemia in chronic kidney disease: Secondary | ICD-10-CM | POA: Insufficient documentation

## 2017-05-23 DIAGNOSIS — D649 Anemia, unspecified: Secondary | ICD-10-CM

## 2017-05-23 DIAGNOSIS — D696 Thrombocytopenia, unspecified: Secondary | ICD-10-CM

## 2017-05-23 DIAGNOSIS — Z79899 Other long term (current) drug therapy: Secondary | ICD-10-CM | POA: Diagnosis not present

## 2017-05-23 DIAGNOSIS — I130 Hypertensive heart and chronic kidney disease with heart failure and stage 1 through stage 4 chronic kidney disease, or unspecified chronic kidney disease: Secondary | ICD-10-CM | POA: Insufficient documentation

## 2017-05-23 LAB — BASIC METABOLIC PANEL
ANION GAP: 6 (ref 5–15)
BUN: 50 mg/dL — AB (ref 6–20)
CALCIUM: 8.7 mg/dL — AB (ref 8.9–10.3)
CO2: 34 mmol/L — ABNORMAL HIGH (ref 22–32)
Chloride: 97 mmol/L — ABNORMAL LOW (ref 101–111)
Creatinine, Ser: 1.89 mg/dL — ABNORMAL HIGH (ref 0.44–1.00)
GFR calc Af Amer: 28 mL/min — ABNORMAL LOW (ref >60)
GFR, EST NON AFRICAN AMERICAN: 24 mL/min — AB (ref >60)
GLUCOSE: 188 mg/dL — AB (ref 65–99)
Potassium: 3.3 mmol/L — ABNORMAL LOW (ref 3.5–5.1)
Sodium: 137 mmol/L (ref 135–145)

## 2017-05-23 LAB — CBC WITH DIFFERENTIAL/PLATELET
Basophils Absolute: 0 10*3/uL (ref 0–0.1)
Basophils Relative: 1 %
EOS ABS: 0.1 10*3/uL (ref 0–0.7)
Eosinophils Relative: 1 %
HCT: 28.6 % — ABNORMAL LOW (ref 35.0–47.0)
HEMOGLOBIN: 10.2 g/dL — AB (ref 12.0–16.0)
LYMPHS ABS: 0.5 10*3/uL — AB (ref 1.0–3.6)
LYMPHS PCT: 10 %
MCH: 34.5 pg — AB (ref 26.0–34.0)
MCHC: 35.8 g/dL (ref 32.0–36.0)
MCV: 96.3 fL (ref 80.0–100.0)
Monocytes Absolute: 0.4 10*3/uL (ref 0.2–0.9)
Monocytes Relative: 8 %
NEUTROS ABS: 3.8 10*3/uL (ref 1.4–6.5)
NEUTROS PCT: 80 %
Platelets: 96 10*3/uL — ABNORMAL LOW (ref 150–440)
RBC: 2.97 MIL/uL — AB (ref 3.80–5.20)
RDW: 16.3 % — ABNORMAL HIGH (ref 11.5–14.5)
WBC: 4.7 10*3/uL (ref 3.6–11.0)

## 2017-05-23 LAB — IRON AND TIBC
Iron: 110 ug/dL (ref 28–170)
Saturation Ratios: 37 % — ABNORMAL HIGH (ref 10.4–31.8)
TIBC: 300 ug/dL (ref 250–450)
UIBC: 190 ug/dL

## 2017-05-23 LAB — FERRITIN: FERRITIN: 225 ng/mL (ref 11–307)

## 2017-05-23 MED ORDER — SODIUM CHLORIDE 0.9 % IV SOLN
Freq: Once | INTRAVENOUS | Status: AC
  Administered 2017-05-23: 14:00:00 via INTRAVENOUS
  Filled 2017-05-23: qty 1000

## 2017-05-23 MED ORDER — SODIUM CHLORIDE 0.9 % IJ SOLN
10.0000 mL | INTRAMUSCULAR | Status: DC | PRN
  Administered 2017-05-23: 10 mL
  Filled 2017-05-23: qty 10

## 2017-05-23 MED ORDER — HEPARIN SOD (PORK) LOCK FLUSH 100 UNIT/ML IV SOLN
500.0000 [IU] | Freq: Once | INTRAVENOUS | Status: AC | PRN
  Administered 2017-05-23: 500 [IU]
  Filled 2017-05-23: qty 5

## 2017-05-23 MED ORDER — FERUMOXYTOL INJECTION 510 MG/17 ML
510.0000 mg | Freq: Once | INTRAVENOUS | Status: AC
  Administered 2017-05-23: 510 mg via INTRAVENOUS
  Filled 2017-05-23: qty 17

## 2017-06-02 ENCOUNTER — Encounter: Payer: Self-pay | Admitting: Gastroenterology

## 2017-06-02 ENCOUNTER — Ambulatory Visit (INDEPENDENT_AMBULATORY_CARE_PROVIDER_SITE_OTHER): Payer: Medicare Other | Admitting: Gastroenterology

## 2017-06-02 VITALS — BP 167/78 | HR 62 | Temp 97.9°F | Ht 63.0 in | Wt 173.0 lb

## 2017-06-02 DIAGNOSIS — D509 Iron deficiency anemia, unspecified: Secondary | ICD-10-CM

## 2017-06-02 DIAGNOSIS — I2111 ST elevation (STEMI) myocardial infarction involving right coronary artery: Secondary | ICD-10-CM

## 2017-06-02 NOTE — Progress Notes (Signed)
[name] MD, MRCP(U.K) [address]  [place]  [place], [place] [address]  Main: [phone]  Fax: [phone]   Primary Care Physician: Kirk Ruths, MD  Primary Gastroenterologist:  Dr. [name]   Chief Complaint  Patient presents with  . Hospitalization Follow-up    HPI: Morgan [name] is a 82 y.o. female   She is here today for a hospital follow up .   Summary of history :  She was admitted on 03/24/17 severe acute on chronic  anemia with a Hb of 5.6 grams, received 3 units PRBC. Dr Allen Norris performed an EGD 03/25/17 - showed gastritis. I subsequently performed a colonoscopy on 03/26/17 -prep was poor, there were multiple sub centimeter polyps seen , no active bleeding noted. Polyps not resected due to poor prep . Plan at discharge was for small bowel capsule study She has previously had a cecal AVM treated with APC . She has a history of CKD, Has received IV iron for severe iron deficiency .    Interval history   03/24/2017-  06/02/2017   She says she is doing well since discharge- feels well, no blood in stools.       Component Value Date/Time   IRON 110 05/23/2017 1320   IRON 50 02/17/2013 0924   TIBC 300 05/23/2017 1320   TIBC 397 02/17/2013 0924   FERRITIN 225 05/23/2017 1320   FERRITIN 191 04/19/2015 0954   IRONPCTSAT 37 (H) 05/23/2017 1320   IRONPCTSAT 13 02/17/2013 0924    CBC Latest Ref Rng & Units 05/23/2017 04/25/2017 04/11/2017  WBC 3.6 - 11.0 K/uL 4.7 - 5.1  Hemoglobin 12.0 - 16.0 g/dL 10.2(L) 9.9(L) 10.5(L)  Hematocrit 35.0 - 47.0 % 28.6(L) 27.7(L) 29.9(L)  Platelets 150 - 440 K/uL 96(L) - 111(L)       Current Outpatient Prescriptions  Medication Sig Dispense Refill  . acetaminophen (TYLENOL) 500 MG tablet Take 500 mg by mouth at bedtime.    Marland Kitchen amiodarone (PACERONE) 100 MG tablet Take 100 mg by mouth daily.    Marland Kitchen amLODipine (NORVASC) 2.5 MG tablet Take 1 tablet (2.5 mg total) by mouth daily. 30 tablet 0  . atorvastatin (LIPITOR) 40 MG  tablet Take 1 tablet (40 mg total) by mouth daily at 6 PM. 30 tablet 0  . carvedilol (COREG) 3.125 MG tablet Take 3.125 mg by mouth 2 (two) times daily.    . diclofenac sodium (VOLTAREN) 1 % GEL Apply topically.    . pantoprazole (PROTONIX) 40 MG tablet Take 40 mg by mouth daily.     Marland Kitchen spironolactone (ALDACTONE) 25 MG tablet Take 25 mg by mouth daily.    Marland Kitchen torsemide (DEMADEX) 100 MG tablet Take 100 mg by mouth daily.     No current facility-administered medications for this visit.    Facility-Administered Medications Ordered in Other Visits  Medication Dose Route Frequency Provider Last Rate Last Dose  . acetaminophen (TYLENOL) tablet 650 mg  650 mg Oral Once Charlaine Dalton R, MD      . diphenhydrAMINE (BENADRYL) capsule 25 mg  25 mg Oral Once Charlaine Dalton R, MD      . epoetin alfa (EPOGEN,PROCRIT) injection 20,000 Units  20,000 Units Subcutaneous Once Charlaine Dalton R, MD      . furosemide (LASIX) injection 20 mg  20 mg Intravenous Once Charlaine Dalton R, MD      . heparin lock flush 100 unit/mL  500 Units Intracatheter Daily PRN Cammie Sickle, MD      .  heparin lock flush 100 unit/mL  250 Units Intracatheter PRN Charlaine Dalton R, MD      . sodium chloride flush (NS) 0.9 % injection 10 mL  10 mL Intracatheter PRN Charlaine Dalton R, MD      . sodium chloride flush (NS) 0.9 % injection 3 mL  3 mL Intracatheter PRN Cammie Sickle, MD        Allergies as of 06/02/2017 - Review Complete 06/02/2017  Allergen Reaction Noted  . Aspirin Other (See Comments) 11/08/2015  . Atenolol Other (See Comments) 05/19/2015  . Clopidogrel bisulfate Other (See Comments) 11/08/2015  . Darvon [propoxyphene] Hives and Other (See Comments) 04/25/2015  . Hydrochlorothiazide Other (See Comments) 05/19/2015  . Iodinated diagnostic agents Other (See Comments) 05/19/2015  . Nylon Hives 04/25/2015  . Other Other (See Comments) 05/19/2015  . Pravastatin Other (See Comments)  05/19/2015    ROS:  General: Negative for anorexia, weight loss, fever, chills, fatigue, weakness. ENT: Negative for hoarseness, difficulty swallowing , nasal congestion. CV: Negative for chest pain, angina, palpitations, dyspnea on exertion, peripheral edema.  Respiratory: Negative for dyspnea at rest, dyspnea on exertion, cough, sputum, wheezing.  GI: See history of present illness. GU:  Negative for dysuria, hematuria, urinary incontinence, urinary frequency, nocturnal urination.  Endo: Negative for unusual weight change.    Physical Examination:   BP (!) 167/78 (BP Location: Left Arm, Patient Position: Sitting, Cuff Size: Normal)   Pulse 62   Temp 97.9 F (36.6 C) (Oral)   Ht 5\' 3"  (1.6 m)   Wt 173 lb (78.5 kg)   BMI 30.65 kg/m   General: Well-nourished, well-developed in no acute distress.  Eyes: No icterus. Conjunctivae pink. Mouth: Oropharyngeal mucosa moist and pink , no lesions erythema or exudate. Lungs: Clear to auscultation bilaterally. Non-labored. Heart: Regular rate and rhythm, systolic loud murmur in the aortic area Abdomen: Bowel sounds are normal, nontender, nondistended, no hepatosplenomegaly or masses, no abdominal bruits or hernia , no rebound or guarding.   Extremities: No lower extremity edema. No clubbing or deformities. Neuro: Alert and oriented x 3.  Grossly intact. Skin: Warm and dry, no jaundice.   Psych: Alert and cooperative, normal mood and affect.   Imaging Studies: No results found.  Assessment and Plan:   Morgan [name] is a 82 y.o. y/o female here to follow up for acute on chronic GI bleed. No obvious source seen on EGD+ colonoscopy recently. Receives IV iron with hematology. Hb has been stable. She could have small bowel AVM's as she has a history of CKD as well as aortic stenosis . She also had multiple polyps in the colon which were not resected due to poor prep and the fact that she had been admitted at the time of the endoscopy for a gi  bleed. Long discussion regarding her options of a small bowel capsule study and if desired colonoscopy to resect the colon polyps. She was absolutely clear that she did not wish to undergo any further procedures at this time but will definetly call my office if things were to change . She will follow up with Dr Rogue Bussing who is monitoring her anemia.   Dr [name]  MD,MRCP [facility]) Follow up as needed

## 2017-06-30 ENCOUNTER — Other Ambulatory Visit: Payer: Self-pay | Admitting: *Deleted

## 2017-06-30 ENCOUNTER — Inpatient Hospital Stay: Payer: Medicare Other | Attending: Internal Medicine

## 2017-06-30 ENCOUNTER — Ambulatory Visit: Payer: Medicare Other | Admitting: Internal Medicine

## 2017-06-30 ENCOUNTER — Inpatient Hospital Stay: Payer: Medicare Other

## 2017-06-30 DIAGNOSIS — D631 Anemia in chronic kidney disease: Secondary | ICD-10-CM | POA: Insufficient documentation

## 2017-06-30 DIAGNOSIS — N183 Chronic kidney disease, stage 3 unspecified: Secondary | ICD-10-CM

## 2017-06-30 DIAGNOSIS — Z79899 Other long term (current) drug therapy: Secondary | ICD-10-CM | POA: Insufficient documentation

## 2017-06-30 DIAGNOSIS — D509 Iron deficiency anemia, unspecified: Secondary | ICD-10-CM

## 2017-06-30 DIAGNOSIS — I130 Hypertensive heart and chronic kidney disease with heart failure and stage 1 through stage 4 chronic kidney disease, or unspecified chronic kidney disease: Secondary | ICD-10-CM | POA: Insufficient documentation

## 2017-06-30 DIAGNOSIS — D500 Iron deficiency anemia secondary to blood loss (chronic): Secondary | ICD-10-CM

## 2017-06-30 DIAGNOSIS — D696 Thrombocytopenia, unspecified: Secondary | ICD-10-CM

## 2017-06-30 LAB — IRON AND TIBC
Iron: 142 ug/dL (ref 28–170)
SATURATION RATIOS: 50 % — AB (ref 10.4–31.8)
TIBC: 287 ug/dL (ref 250–450)
UIBC: 145 ug/dL

## 2017-06-30 LAB — CBC WITH DIFFERENTIAL/PLATELET
BASOS ABS: 0.1 10*3/uL (ref 0–0.1)
BASOS PCT: 1 %
Eosinophils Absolute: 0.1 10*3/uL (ref 0–0.7)
Eosinophils Relative: 2 %
HEMATOCRIT: 30.3 % — AB (ref 35.0–47.0)
Hemoglobin: 10.9 g/dL — ABNORMAL LOW (ref 12.0–16.0)
LYMPHS PCT: 9 %
Lymphs Abs: 0.4 10*3/uL — ABNORMAL LOW (ref 1.0–3.6)
MCH: 35 pg — ABNORMAL HIGH (ref 26.0–34.0)
MCHC: 36 g/dL (ref 32.0–36.0)
MCV: 97.1 fL (ref 80.0–100.0)
Monocytes Absolute: 0.5 10*3/uL (ref 0.2–0.9)
Monocytes Relative: 12 %
NEUTROS ABS: 3.5 10*3/uL (ref 1.4–6.5)
Neutrophils Relative %: 76 %
PLATELETS: 108 10*3/uL — AB (ref 150–440)
RBC: 3.12 MIL/uL — AB (ref 3.80–5.20)
RDW: 14.6 % — ABNORMAL HIGH (ref 11.5–14.5)
WBC: 4.6 10*3/uL (ref 3.6–11.0)

## 2017-06-30 LAB — COMPREHENSIVE METABOLIC PANEL
ALBUMIN: 3.3 g/dL — AB (ref 3.5–5.0)
ALT: 24 U/L (ref 14–54)
AST: 35 U/L (ref 15–41)
Alkaline Phosphatase: 160 U/L — ABNORMAL HIGH (ref 38–126)
Anion gap: 13 (ref 5–15)
BUN: 34 mg/dL — AB (ref 6–20)
CHLORIDE: 89 mmol/L — AB (ref 101–111)
CO2: 35 mmol/L — ABNORMAL HIGH (ref 22–32)
CREATININE: 2.12 mg/dL — AB (ref 0.44–1.00)
Calcium: 8.6 mg/dL — ABNORMAL LOW (ref 8.9–10.3)
GFR calc Af Amer: 24 mL/min — ABNORMAL LOW (ref >60)
GFR calc non Af Amer: 21 mL/min — ABNORMAL LOW (ref >60)
GLUCOSE: 183 mg/dL — AB (ref 65–99)
POTASSIUM: 3 mmol/L — AB (ref 3.5–5.1)
Sodium: 137 mmol/L (ref 135–145)
Total Bilirubin: 1.8 mg/dL — ABNORMAL HIGH (ref 0.3–1.2)
Total Protein: 7.1 g/dL (ref 6.5–8.1)

## 2017-06-30 LAB — FERRITIN: FERRITIN: 373 ng/mL — AB (ref 11–307)

## 2017-06-30 MED ORDER — HEPARIN SOD (PORK) LOCK FLUSH 100 UNIT/ML IV SOLN
500.0000 [IU] | Freq: Once | INTRAVENOUS | Status: AC
  Administered 2017-06-30: 500 [IU] via INTRAVENOUS

## 2017-06-30 MED ORDER — SODIUM CHLORIDE 0.9 % IV SOLN
Freq: Once | INTRAVENOUS | Status: AC
  Administered 2017-06-30: 14:00:00 via INTRAVENOUS
  Filled 2017-06-30: qty 1000

## 2017-06-30 MED ORDER — FERUMOXYTOL INJECTION 510 MG/17 ML
510.0000 mg | Freq: Once | INTRAVENOUS | Status: AC
  Administered 2017-06-30: 510 mg via INTRAVENOUS
  Filled 2017-06-30: qty 17

## 2017-06-30 MED ORDER — SODIUM CHLORIDE 0.9% FLUSH
10.0000 mL | Freq: Once | INTRAVENOUS | Status: AC
  Administered 2017-06-30: 10 mL via INTRAVENOUS
  Filled 2017-06-30: qty 10

## 2017-06-30 NOTE — Progress Notes (Signed)
Per Dr Rogue Bussing pt to receive feraheme today. No need to wait for lab results

## 2017-07-25 ENCOUNTER — Inpatient Hospital Stay: Payer: Medicare Other | Attending: Internal Medicine | Admitting: Internal Medicine

## 2017-07-25 ENCOUNTER — Inpatient Hospital Stay: Payer: Medicare Other

## 2017-07-25 DIAGNOSIS — Z87891 Personal history of nicotine dependence: Secondary | ICD-10-CM | POA: Diagnosis not present

## 2017-07-25 DIAGNOSIS — K589 Irritable bowel syndrome without diarrhea: Secondary | ICD-10-CM | POA: Insufficient documentation

## 2017-07-25 DIAGNOSIS — E1122 Type 2 diabetes mellitus with diabetic chronic kidney disease: Secondary | ICD-10-CM | POA: Insufficient documentation

## 2017-07-25 DIAGNOSIS — Z8541 Personal history of malignant neoplasm of cervix uteri: Secondary | ICD-10-CM | POA: Diagnosis not present

## 2017-07-25 DIAGNOSIS — I429 Cardiomyopathy, unspecified: Secondary | ICD-10-CM | POA: Diagnosis not present

## 2017-07-25 DIAGNOSIS — Z955 Presence of coronary angioplasty implant and graft: Secondary | ICD-10-CM | POA: Diagnosis not present

## 2017-07-25 DIAGNOSIS — N183 Chronic kidney disease, stage 3 unspecified: Secondary | ICD-10-CM

## 2017-07-25 DIAGNOSIS — M109 Gout, unspecified: Secondary | ICD-10-CM | POA: Insufficient documentation

## 2017-07-25 DIAGNOSIS — I2510 Atherosclerotic heart disease of native coronary artery without angina pectoris: Secondary | ICD-10-CM | POA: Diagnosis not present

## 2017-07-25 DIAGNOSIS — Z79899 Other long term (current) drug therapy: Secondary | ICD-10-CM

## 2017-07-25 DIAGNOSIS — D631 Anemia in chronic kidney disease: Secondary | ICD-10-CM | POA: Diagnosis not present

## 2017-07-25 DIAGNOSIS — I130 Hypertensive heart and chronic kidney disease with heart failure and stage 1 through stage 4 chronic kidney disease, or unspecified chronic kidney disease: Secondary | ICD-10-CM | POA: Diagnosis not present

## 2017-07-25 DIAGNOSIS — K219 Gastro-esophageal reflux disease without esophagitis: Secondary | ICD-10-CM | POA: Diagnosis not present

## 2017-07-25 DIAGNOSIS — I350 Nonrheumatic aortic (valve) stenosis: Secondary | ICD-10-CM | POA: Diagnosis not present

## 2017-07-25 DIAGNOSIS — D500 Iron deficiency anemia secondary to blood loss (chronic): Secondary | ICD-10-CM

## 2017-07-25 DIAGNOSIS — J449 Chronic obstructive pulmonary disease, unspecified: Secondary | ICD-10-CM | POA: Diagnosis not present

## 2017-07-25 DIAGNOSIS — M1990 Unspecified osteoarthritis, unspecified site: Secondary | ICD-10-CM | POA: Insufficient documentation

## 2017-07-25 DIAGNOSIS — E785 Hyperlipidemia, unspecified: Secondary | ICD-10-CM | POA: Diagnosis not present

## 2017-07-25 DIAGNOSIS — D696 Thrombocytopenia, unspecified: Secondary | ICD-10-CM | POA: Diagnosis not present

## 2017-07-25 DIAGNOSIS — D509 Iron deficiency anemia, unspecified: Secondary | ICD-10-CM

## 2017-07-25 LAB — IRON AND TIBC
Iron: 85 ug/dL (ref 28–170)
Saturation Ratios: 33 % — ABNORMAL HIGH (ref 10.4–31.8)
TIBC: 258 ug/dL (ref 250–450)
UIBC: 173 ug/dL

## 2017-07-25 LAB — CBC WITH DIFFERENTIAL/PLATELET
BASOS ABS: 0.1 10*3/uL (ref 0–0.1)
BASOS PCT: 1 %
EOS ABS: 0.1 10*3/uL (ref 0–0.7)
Eosinophils Relative: 1 %
HEMATOCRIT: 31.2 % — AB (ref 35.0–47.0)
HEMOGLOBIN: 10.8 g/dL — AB (ref 12.0–16.0)
LYMPHS ABS: 0.3 10*3/uL — AB (ref 1.0–3.6)
Lymphocytes Relative: 7 %
MCH: 34.6 pg — ABNORMAL HIGH (ref 26.0–34.0)
MCHC: 34.5 g/dL (ref 32.0–36.0)
MCV: 100.1 fL — ABNORMAL HIGH (ref 80.0–100.0)
Monocytes Absolute: 0.6 10*3/uL (ref 0.2–0.9)
Monocytes Relative: 11 %
NEUTROS PCT: 80 %
Neutro Abs: 3.9 10*3/uL (ref 1.4–6.5)
Platelets: 108 10*3/uL — ABNORMAL LOW (ref 150–440)
RBC: 3.12 MIL/uL — AB (ref 3.80–5.20)
RDW: 14.5 % (ref 11.5–14.5)
WBC: 5 10*3/uL (ref 3.6–11.0)

## 2017-07-25 LAB — COMPREHENSIVE METABOLIC PANEL
ALBUMIN: 3.3 g/dL — AB (ref 3.5–5.0)
ALK PHOS: 180 U/L — AB (ref 38–126)
ALT: 32 U/L (ref 14–54)
AST: 43 U/L — AB (ref 15–41)
Anion gap: 11 (ref 5–15)
BILIRUBIN TOTAL: 1.6 mg/dL — AB (ref 0.3–1.2)
BUN: 39 mg/dL — AB (ref 6–20)
CALCIUM: 8.4 mg/dL — AB (ref 8.9–10.3)
CO2: 32 mmol/L (ref 22–32)
CREATININE: 2.13 mg/dL — AB (ref 0.44–1.00)
Chloride: 98 mmol/L — ABNORMAL LOW (ref 101–111)
GFR calc Af Amer: 24 mL/min — ABNORMAL LOW (ref >60)
GFR calc non Af Amer: 21 mL/min — ABNORMAL LOW (ref >60)
Glucose, Bld: 182 mg/dL — ABNORMAL HIGH (ref 65–99)
Potassium: 4 mmol/L (ref 3.5–5.1)
Sodium: 141 mmol/L (ref 135–145)
TOTAL PROTEIN: 7 g/dL (ref 6.5–8.1)

## 2017-07-25 LAB — FERRITIN: Ferritin: 533 ng/mL — ABNORMAL HIGH (ref 11–307)

## 2017-07-25 NOTE — Progress Notes (Signed)
[place] OFFICE PROGRESS NOTE  Patient Care Team: Kirk Ruths, MD as PCP - General (Internal Medicine) Cammie Sickle, MD as Consulting Physician (Hematology and Oncology)   SUMMARY OF HEMATOLOGIC HISTORY:  # CHRONIC ANEMIA sec Hx GIB/CKD [ CKD STAGE III]; Jan-2016 [colo/EGD- Dr.Elliot]; capsule x2 - angiodysplasia; BMBx- 1999; IV iron & procrit; July 2017- colo- AVMs in cecum & stomach [s/p Argon; Dr.Buccini; [place]]; Aug 2017- Declines Procrit. May 2018 GI bleed-hemoglobin 5.3 status post EGD colonoscopy-negative.  # CHRONIC MILD THROMBOCYTOPENIA-   # CKD [creat 1.4-1.7]  # CHRONIC CELLULITIS/ AORTIC STENOSIS/ LIMITED MOBILITY   INTERVAL HISTORY:  82 year old female patient with multiple medical problems and above history of chronic anemia from chronic GI blood loss/chronic kidney disease on iron; declined Procrit is here for follow-up.  Patient last received IV Feraheme approximately month ago. She has not needed any blood in the interim.  No blood in stools black stools. No obvious bleeding.  Denies any blood in stools. No nausea or vomiting. No fevers or chills.  REVIEW OF SYSTEMS:  A complete 10 point review of system is done which is negative except mentioned above/history of present illness.   PAST MEDICAL HISTORY :  Past Medical History:  Diagnosis Date  . Abnormal LFTs (liver function tests)   . Adenomatous polyps 03/05/2012  . Angiodysplasia   . Aortic stenosis   . Arthritis   . AV malformation of GI tract   . Bilateral cellulitis of lower leg   . CAD (coronary artery disease)   . Cardiomyopathy ([place])   . Carotid bruit   . Cervical cancer ([place])    cervical cancer history  . CHF (congestive heart failure) ([place])   . Chronic edema   . Clostridium difficile infection March 02, 2015  . Complication of anesthesia     I have trouble waking up "  . COPD (chronic obstructive pulmonary disease) ([place])   . Diabetes mellitus with stage 3  chronic kidney disease ([place])   . Fatty liver   . Fibrocystic breast changes   . GERD (gastroesophageal reflux disease)   . GI bleed   . Gout   . History of blood transfusion   . History of nicotine use   . Hyperlipidemia   . Hypertension   . Iron deficiency anemia 2008  . Irritable bowel syndrome   . Macrocytosis   . Osteoarthritis   . Peptic ulcer disease   . PVD (peripheral vascular disease) ([place])   . Retinal vein occlusion of left eye   . Tobacco abuse   . Trigeminal neuralgia   . Vascular malformation 06/14/1997    PAST SURGICAL HISTORY :   Past Surgical History:  Procedure Laterality Date  . ABDOMINAL HYSTERECTOMY    . angiogram cardiac stenting    . BONE MARROW BIOPSY  1999  . CARDIAC CATHETERIZATION N/A 12/02/2016   Procedure: Left Heart Cath and Coronary Angiography;  Surgeon: Wellington Hampshire, MD;  Location: [place];  Service: Cardiovascular;  Laterality: N/A;  . CARDIAC CATHETERIZATION N/A 12/02/2016   Procedure: Coronary Balloon Angioplasty;  Surgeon: Wellington Hampshire, MD;  Location: [place] [facility];  Service: Cardiovascular;  Laterality: N/A;  . CHOLECYSTECTOMY    . COLONOSCOPY  12/2014, 2014, 2013, 2008, 2004   03/05/2012-Adenomatous Polyps;09/23/2003-Polyp remains intact;   . COLONOSCOPY N/A 07/12/2016   Procedure: COLONOSCOPY;  Surgeon: Ronald Lobo, MD;  Location: [facility] ENDOSCOPY;  Service: Endoscopy;  Laterality: N/A;  . COLONOSCOPY N/A 08/23/2016  Procedure: COLONOSCOPY;  Surgeon: Lucilla Lame, MD;  Location: [facility] ENDOSCOPY;  Service: Endoscopy;  Laterality: N/A;  . COLONOSCOPY WITH PROPOFOL N/A 03/26/2017   Procedure: COLONOSCOPY WITH PROPOFOL;  Surgeon: [name], MD;  Location: [facility] ENDOSCOPY;  Service: Endoscopy;  Laterality: N/A;  . Endoscopic Carpal Tunnel Release    . ESOPHAGOGASTRODUODENOSCOPY  12/2014, 2014, 2013, 2009, 2004  . ESOPHAGOGASTRODUODENOSCOPY (EGD) WITH PROPOFOL N/A 07/12/2016   Procedure: ESOPHAGOGASTRODUODENOSCOPY (EGD)  WITH PROPOFOL;  Surgeon: Ronald Lobo, MD;  Location: [place];  Service: Endoscopy;  Laterality: N/A;  . ESOPHAGOGASTRODUODENOSCOPY (EGD) WITH PROPOFOL N/A 08/23/2016   Procedure: ESOPHAGOGASTRODUODENOSCOPY (EGD) WITH PROPOFOL;  Surgeon: Lucilla Lame, MD;  Location: [facility] ENDOSCOPY;  Service: Endoscopy;  Laterality: N/A;  . ESOPHAGOGASTRODUODENOSCOPY (EGD) WITH PROPOFOL N/A 03/25/2017   Procedure: ESOPHAGOGASTRODUODENOSCOPY (EGD) WITH PROPOFOL;  Surgeon: Lucilla Lame, MD;  Location: [facility] ENDOSCOPY;  Service: Endoscopy;  Laterality: N/A;  . HOT HEMOSTASIS N/A 07/12/2016   Procedure: HOT HEMOSTASIS (ARGON PLASMA COAGULATION/BICAP);  Surgeon: Ronald Lobo, MD;  Location: [facility] ENDOSCOPY;  Service: Endoscopy;  Laterality: N/A;  . NASAL SINUS SURGERY    . TEE WITHOUT CARDIOVERSION N/A 12/19/2015   Procedure: TRANSESOPHAGEAL ECHOCARDIOGRAM (TEE);  Surgeon: Yolonda Kida, MD;  Location: [facility] ORS;  Service: Cardiovascular;  Laterality: N/A;  . THORACENTESIS    . TONSILLECTOMY      FAMILY HISTORY :   Family History  Problem Relation Age of Onset  . Parkinsonism Mother   . Heart attack Mother   . Lung cancer Father   . Diabetes Mellitus II Father   . Asthma Sister   . Asthma Brother   . Heart disease Son     SOCIAL HISTORY:   Social History  Substance Use Topics  . Smoking status: Former Smoker    Packs/day: 0.50    Years: 56.00    Types: Cigarettes    Quit date: 05/23/2013  . Smokeless tobacco: Never Used  . Alcohol use No    ALLERGIES:  is allergic to aspirin; atenolol; clopidogrel bisulfate; darvon [propoxyphene]; hydrochlorothiazide; iodinated diagnostic agents; nylon; other; and pravastatin.  MEDICATIONS:  Current Outpatient Prescriptions  Medication Sig Dispense Refill  . acetaminophen (TYLENOL) 500 MG tablet Take 500 mg by mouth at bedtime.    Marland Kitchen amiodarone (PACERONE) 100 MG tablet Take 100 mg by mouth daily.    Marland Kitchen amLODipine (NORVASC) 2.5 MG tablet Take 1 tablet (2.5 mg  total) by mouth daily. 30 tablet 0  . atorvastatin (LIPITOR) 40 MG tablet Take 1 tablet (40 mg total) by mouth daily at 6 PM. 30 tablet 0  . carvedilol (COREG) 3.125 MG tablet Take 3.125 mg by mouth 2 (two) times daily.    . diclofenac sodium (VOLTAREN) 1 % GEL Apply topically.    . pantoprazole (PROTONIX) 40 MG tablet Take 40 mg by mouth daily.     Marland Kitchen spironolactone (ALDACTONE) 25 MG tablet Take 25 mg by mouth daily.    Marland Kitchen torsemide (DEMADEX) 100 MG tablet Take 100 mg by mouth daily.     No current facility-administered medications for this visit.    Facility-Administered Medications Ordered in Other Visits  Medication Dose Route Frequency Provider Last Rate Last Dose  . acetaminophen (TYLENOL) tablet 650 mg  650 mg Oral Once Charlaine Dalton R, MD      . diphenhydrAMINE (BENADRYL) capsule 25 mg  25 mg Oral Once Charlaine Dalton R, MD      . epoetin alfa (EPOGEN,PROCRIT) injection 20,000 Units  20,000 Units Subcutaneous Once [place], [place]  R, MD      . furosemide (LASIX) injection 20 mg  20 mg Intravenous Once Charlaine Dalton R, MD      . heparin lock flush 100 unit/mL  500 Units Intracatheter Daily PRN Charlaine Dalton R, MD      . heparin lock flush 100 unit/mL  250 Units Intracatheter PRN Charlaine Dalton R, MD      . sodium chloride flush (NS) 0.9 % injection 10 mL  10 mL Intracatheter PRN Charlaine Dalton R, MD      . sodium chloride flush (NS) 0.9 % injection 3 mL  3 mL Intracatheter PRN Cammie Sickle, MD        PHYSICAL EXAMINATION: ECOG PERFORMANCE STATUS: 2 - Symptomatic, <50% confined to bed  BP (!) 152/63 (BP Location: Left Arm, Patient Position: Sitting)   Pulse 66   Temp 97.9 F (36.6 C) (Tympanic)   Wt 163 lb (73.9 kg)   BMI 28.87 kg/m   Filed Weights   07/25/17 1120  Weight: 163 lb (73.9 kg)    GENERAL: Well-nourished well-developed; Alert, no distress and comfortable.   She is accompanied by her Caregiver. She is in a wheelchair.  She cannot sit on the exam table.  EYES: Positive for pallor. OROPHARYNX: no thrush or ulceration; good dentition  NECK: supple, no masses felt LYMPH:  no palpable lymphadenopathy in the cervical,  LUNGS: clear to auscultation and  No wheeze or crackles HEART/CVS: regular rate & rhythm and positive for systolic murmurs; bilateral lower extremity swelling ; left larger than the right /chronic venous stasis changes noted.  ABDOMEN:abdomen soft, non-tender and normal bowel sounds Musculoskeletal:no cyanosis of digits and no clubbing  PSYCH: alert & oriented x 3 with fluent speech NEURO: no focal motor/sensory deficits SKIN:  erythema of the left lower extremity /improving.   LABORATORY DATA:  I have reviewed the data as listed    Component Value Date/Time   NA 141 07/25/2017 1045   NA 139 01/18/2015 0545   K 4.0 07/25/2017 1045   K 3.9 01/18/2015 0545   CL 98 (L) 07/25/2017 1045   CL 105 01/18/2015 0545   CO2 32 07/25/2017 1045   CO2 25 01/18/2015 0545   GLUCOSE 182 (H) 07/25/2017 1045   GLUCOSE 179 (H) 01/18/2015 0545   BUN 39 (H) 07/25/2017 1045   BUN 29 (H) 01/18/2015 0545   CREATININE 2.13 (H) 07/25/2017 1045   CREATININE 1.80 (H) 01/18/2015 0545   CALCIUM 8.4 (L) 07/25/2017 1045   CALCIUM 8.7 01/18/2015 0545   PROT 7.0 07/25/2017 1045   PROT 6.6 06/20/2014 1101   ALBUMIN 3.3 (L) 07/25/2017 1045   ALBUMIN 3.1 (L) 06/20/2014 1101   AST 43 (H) 07/25/2017 1045   AST 32 06/20/2014 1101   ALT 32 07/25/2017 1045   ALT 31 06/20/2014 1101   ALKPHOS 180 (H) 07/25/2017 1045   ALKPHOS 125 (H) 06/20/2014 1101   BILITOT 1.6 (H) 07/25/2017 1045   BILITOT 0.3 06/20/2014 1101   GFRNONAA 21 (L) 07/25/2017 1045   GFRNONAA 29 (L) 01/18/2015 0545   GFRNONAA 28 (L) 06/20/2014 1101   GFRAA 24 (L) 07/25/2017 1045   GFRAA 35 (L) 01/18/2015 0545   GFRAA 33 (L) 06/20/2014 1101    No results found for: SPEP, UPEP  Lab Results  Component Value Date   WBC 5.0 07/25/2017   NEUTROABS 3.9  07/25/2017   HGB 10.8 (L) 07/25/2017   HCT 31.2 (L) 07/25/2017   MCV 100.1 (H) 07/25/2017  PLT 108 (L) 07/25/2017      Chemistry      Component Value Date/Time   NA 141 07/25/2017 1045   NA 139 01/18/2015 0545   K 4.0 07/25/2017 1045   K 3.9 01/18/2015 0545   CL 98 (L) 07/25/2017 1045   CL 105 01/18/2015 0545   CO2 32 07/25/2017 1045   CO2 25 01/18/2015 0545   BUN 39 (H) 07/25/2017 1045   BUN 29 (H) 01/18/2015 0545   CREATININE 2.13 (H) 07/25/2017 1045   CREATININE 1.80 (H) 01/18/2015 0545      Component Value Date/Time   CALCIUM 8.4 (L) 07/25/2017 1045   CALCIUM 8.7 01/18/2015 0545   ALKPHOS 180 (H) 07/25/2017 1045   ALKPHOS 125 (H) 06/20/2014 1101   AST 43 (H) 07/25/2017 1045   AST 32 06/20/2014 1101   ALT 32 07/25/2017 1045   ALT 31 06/20/2014 1101   BILITOT 1.6 (H) 07/25/2017 1045   BILITOT 0.3 06/20/2014 1101        ASSESSMENT & PLAN:   Anemia due to stage 3 chronic kidney disease # CHRONIC ANEMIA-chronic kidney disease [Patient declines Procrit  ]/ secondary to GI bleed/AV malformations; iron deficiency.  Today hemoglobin 10.3. Hold IV iron transfusion.   #  Mild thrombocytopenia-question ITP versus MDS stable. Currently on surveillance.  # Chronic kidney disease stage III-4 stable.  #Follow-up with me in 3 months [facility] BMP/Iron studies/ Ferritin. Labs/IV Feraheme in one month.  Iron deficiency anemia due to chronic blood loss       Cammie Sickle, MD 07/25/2017 1:38 PM

## 2017-07-25 NOTE — Progress Notes (Signed)
No iron today per Dr. Rogue Bussing. LJ

## 2017-07-25 NOTE — Assessment & Plan Note (Addendum)
#   CHRONIC ANEMIA-chronic kidney disease [Patient declines Procrit  ]/ secondary to GI bleed/AV malformations; iron deficiency.  Today hemoglobin 10.3. Hold IV iron transfusion.   #  Mild thrombocytopenia-question ITP versus MDS stable. Currently on surveillance.  # Chronic kidney disease stage III-4 stable.  #Follow-up with me in 3 months [facility] BMP/Iron studies/ Ferritin. Labs/IV Feraheme in one month.

## 2017-08-11 ENCOUNTER — Inpatient Hospital Stay: Payer: Medicare Other

## 2017-08-11 ENCOUNTER — Encounter: Payer: Self-pay | Admitting: Emergency Medicine

## 2017-08-11 ENCOUNTER — Emergency Department: Payer: Medicare Other

## 2017-08-11 ENCOUNTER — Inpatient Hospital Stay
Admission: EM | Admit: 2017-08-11 | Discharge: 2017-08-14 | DRG: 180 | Disposition: A | Discharge status: Hospice | Payer: Medicare Other | Attending: Internal Medicine | Admitting: Internal Medicine

## 2017-08-11 DIAGNOSIS — Z66 Do not resuscitate: Secondary | ICD-10-CM | POA: Diagnosis present

## 2017-08-11 DIAGNOSIS — J441 Chronic obstructive pulmonary disease with (acute) exacerbation: Secondary | ICD-10-CM | POA: Diagnosis present

## 2017-08-11 DIAGNOSIS — C3411 Malignant neoplasm of upper lobe, right bronchus or lung: Secondary | ICD-10-CM | POA: Diagnosis present

## 2017-08-11 DIAGNOSIS — I350 Nonrheumatic aortic (valve) stenosis: Secondary | ICD-10-CM | POA: Diagnosis present

## 2017-08-11 DIAGNOSIS — D500 Iron deficiency anemia secondary to blood loss (chronic): Secondary | ICD-10-CM | POA: Diagnosis present

## 2017-08-11 DIAGNOSIS — J90 Pleural effusion, not elsewhere classified: Secondary | ICD-10-CM | POA: Diagnosis not present

## 2017-08-11 DIAGNOSIS — R0603 Acute respiratory distress: Secondary | ICD-10-CM

## 2017-08-11 DIAGNOSIS — Z79899 Other long term (current) drug therapy: Secondary | ICD-10-CM | POA: Diagnosis not present

## 2017-08-11 DIAGNOSIS — J9601 Acute respiratory failure with hypoxia: Secondary | ICD-10-CM | POA: Diagnosis present

## 2017-08-11 DIAGNOSIS — N179 Acute kidney failure, unspecified: Secondary | ICD-10-CM | POA: Diagnosis present

## 2017-08-11 DIAGNOSIS — J810 Acute pulmonary edema: Secondary | ICD-10-CM

## 2017-08-11 DIAGNOSIS — I5023 Acute on chronic systolic (congestive) heart failure: Secondary | ICD-10-CM | POA: Diagnosis present

## 2017-08-11 DIAGNOSIS — Z7189 Other specified counseling: Secondary | ICD-10-CM

## 2017-08-11 DIAGNOSIS — I2510 Atherosclerotic heart disease of native coronary artery without angina pectoris: Secondary | ICD-10-CM | POA: Diagnosis present

## 2017-08-11 DIAGNOSIS — I420 Dilated cardiomyopathy: Secondary | ICD-10-CM | POA: Diagnosis not present

## 2017-08-11 DIAGNOSIS — J9801 Acute bronchospasm: Secondary | ICD-10-CM | POA: Diagnosis not present

## 2017-08-11 DIAGNOSIS — Z6825 Body mass index (BMI) 25.0-25.9, adult: Secondary | ICD-10-CM

## 2017-08-11 DIAGNOSIS — I130 Hypertensive heart and chronic kidney disease with heart failure and stage 1 through stage 4 chronic kidney disease, or unspecified chronic kidney disease: Secondary | ICD-10-CM | POA: Diagnosis present

## 2017-08-11 DIAGNOSIS — N184 Chronic kidney disease, stage 4 (severe): Secondary | ICD-10-CM

## 2017-08-11 DIAGNOSIS — I252 Old myocardial infarction: Secondary | ICD-10-CM | POA: Diagnosis not present

## 2017-08-11 DIAGNOSIS — D638 Anemia in other chronic diseases classified elsewhere: Secondary | ICD-10-CM | POA: Diagnosis present

## 2017-08-11 DIAGNOSIS — R918 Other nonspecific abnormal finding of lung field: Secondary | ICD-10-CM

## 2017-08-11 DIAGNOSIS — Z515 Encounter for palliative care: Secondary | ICD-10-CM | POA: Diagnosis present

## 2017-08-11 DIAGNOSIS — D631 Anemia in chronic kidney disease: Secondary | ICD-10-CM | POA: Diagnosis not present

## 2017-08-11 DIAGNOSIS — N183 Chronic kidney disease, stage 3 (moderate): Secondary | ICD-10-CM | POA: Diagnosis not present

## 2017-08-11 DIAGNOSIS — J9690 Respiratory failure, unspecified, unspecified whether with hypoxia or hypercapnia: Secondary | ICD-10-CM

## 2017-08-11 DIAGNOSIS — Z886 Allergy status to analgesic agent status: Secondary | ICD-10-CM

## 2017-08-11 DIAGNOSIS — Z8541 Personal history of malignant neoplasm of cervix uteri: Secondary | ICD-10-CM | POA: Diagnosis not present

## 2017-08-11 DIAGNOSIS — D696 Thrombocytopenia, unspecified: Secondary | ICD-10-CM | POA: Diagnosis present

## 2017-08-11 DIAGNOSIS — R634 Abnormal weight loss: Secondary | ICD-10-CM | POA: Diagnosis present

## 2017-08-11 DIAGNOSIS — E1122 Type 2 diabetes mellitus with diabetic chronic kidney disease: Secondary | ICD-10-CM | POA: Diagnosis present

## 2017-08-11 DIAGNOSIS — Z86010 Personal history of colonic polyps: Secondary | ICD-10-CM | POA: Diagnosis not present

## 2017-08-11 DIAGNOSIS — Z91041 Radiographic dye allergy status: Secondary | ICD-10-CM

## 2017-08-11 DIAGNOSIS — E785 Hyperlipidemia, unspecified: Secondary | ICD-10-CM | POA: Diagnosis present

## 2017-08-11 DIAGNOSIS — Z9889 Other specified postprocedural states: Secondary | ICD-10-CM

## 2017-08-11 DIAGNOSIS — Z888 Allergy status to other drugs, medicaments and biological substances status: Secondary | ICD-10-CM

## 2017-08-11 DIAGNOSIS — E1151 Type 2 diabetes mellitus with diabetic peripheral angiopathy without gangrene: Secondary | ICD-10-CM | POA: Diagnosis present

## 2017-08-11 DIAGNOSIS — Z87891 Personal history of nicotine dependence: Secondary | ICD-10-CM | POA: Diagnosis not present

## 2017-08-11 DIAGNOSIS — Z833 Family history of diabetes mellitus: Secondary | ICD-10-CM

## 2017-08-11 DIAGNOSIS — K219 Gastro-esophageal reflux disease without esophagitis: Secondary | ICD-10-CM | POA: Diagnosis present

## 2017-08-11 DIAGNOSIS — I509 Heart failure, unspecified: Secondary | ICD-10-CM

## 2017-08-11 DIAGNOSIS — R0602 Shortness of breath: Secondary | ICD-10-CM | POA: Diagnosis not present

## 2017-08-11 DIAGNOSIS — J910 Malignant pleural effusion: Secondary | ICD-10-CM | POA: Diagnosis present

## 2017-08-11 LAB — HEPATIC FUNCTION PANEL
ALBUMIN: 2.8 g/dL — AB (ref 3.5–5.0)
ALK PHOS: 145 U/L — AB (ref 38–126)
ALT: 27 U/L (ref 14–54)
AST: 41 U/L (ref 15–41)
BILIRUBIN DIRECT: 1 mg/dL — AB (ref 0.1–0.5)
BILIRUBIN TOTAL: 2.7 mg/dL — AB (ref 0.3–1.2)
Indirect Bilirubin: 1.7 mg/dL — ABNORMAL HIGH (ref 0.3–0.9)
Total Protein: 6.8 g/dL (ref 6.5–8.1)

## 2017-08-11 LAB — CBC WITH DIFFERENTIAL/PLATELET
BASOS ABS: 0.1 10*3/uL (ref 0–0.1)
Basophils Relative: 1 %
EOS PCT: 1 %
Eosinophils Absolute: 0.1 10*3/uL (ref 0–0.7)
HEMATOCRIT: 34.8 % — AB (ref 35.0–47.0)
Hemoglobin: 12 g/dL (ref 12.0–16.0)
LYMPHS PCT: 4 %
Lymphs Abs: 0.3 10*3/uL — ABNORMAL LOW (ref 1.0–3.6)
MCH: 34.2 pg — ABNORMAL HIGH (ref 26.0–34.0)
MCHC: 34.6 g/dL (ref 32.0–36.0)
MCV: 99 fL (ref 80.0–100.0)
MONO ABS: 1 10*3/uL — AB (ref 0.2–0.9)
MONOS PCT: 12 %
NEUTROS ABS: 7.2 10*3/uL — AB (ref 1.4–6.5)
Neutrophils Relative %: 82 %
PLATELETS: 166 10*3/uL (ref 150–440)
RBC: 3.51 MIL/uL — ABNORMAL LOW (ref 3.80–5.20)
RDW: 14.3 % (ref 11.5–14.5)
WBC: 8.6 10*3/uL (ref 3.6–11.0)

## 2017-08-11 LAB — BASIC METABOLIC PANEL
Anion gap: 14 (ref 5–15)
BUN: 71 mg/dL — ABNORMAL HIGH (ref 6–20)
CALCIUM: 8.3 mg/dL — AB (ref 8.9–10.3)
CHLORIDE: 95 mmol/L — AB (ref 101–111)
CO2: 29 mmol/L (ref 22–32)
CREATININE: 3.66 mg/dL — AB (ref 0.44–1.00)
GFR calc Af Amer: 13 mL/min — ABNORMAL LOW (ref >60)
GFR calc non Af Amer: 11 mL/min — ABNORMAL LOW (ref >60)
GLUCOSE: 215 mg/dL — AB (ref 65–99)
Potassium: 4 mmol/L (ref 3.5–5.1)
Sodium: 138 mmol/L (ref 135–145)

## 2017-08-11 LAB — LACTATE DEHYDROGENASE, PLEURAL OR PERITONEAL FLUID: LD, Fluid: 1201 U/L — ABNORMAL HIGH (ref 3–23)

## 2017-08-11 LAB — GLUCOSE, CAPILLARY
Glucose-Capillary: 212 mg/dL — ABNORMAL HIGH (ref 65–99)
Glucose-Capillary: 198 mg/dL — ABNORMAL HIGH (ref 65–99)
Glucose-Capillary: 263 mg/dL — ABNORMAL HIGH (ref 65–99)

## 2017-08-11 LAB — TROPONIN I: TROPONIN I: 0.12 ng/mL — AB (ref <0.03)

## 2017-08-11 LAB — GLUCOSE, PLEURAL OR PERITONEAL FLUID: GLUCOSE FL: 163 mg/dL

## 2017-08-11 LAB — HEMOGLOBIN A1C
Hgb A1c MFr Bld: 6.6 % — ABNORMAL HIGH (ref 4.8–5.6)
Mean Plasma Glucose: 142.72 mg/dL

## 2017-08-11 LAB — BODY FLUID CELL COUNT WITH DIFFERENTIAL
Eos, Fluid: 0 %
Lymphs, Fluid: 15 %
MONOCYTE-MACROPHAGE-SEROUS FLUID: 64 %
NEUTROPHIL FLUID: 21 %
Other Cells, Fluid: 0 %
WBC FLUID: 897 uL

## 2017-08-11 LAB — MRSA PCR SCREENING: MRSA BY PCR: NEGATIVE

## 2017-08-11 LAB — BRAIN NATRIURETIC PEPTIDE: B NATRIURETIC PEPTIDE 5: 424 pg/mL — AB (ref 0.0–100.0)

## 2017-08-11 LAB — ALBUMIN, PLEURAL OR PERITONEAL FLUID: Albumin, Fluid: 1.9 g/dL

## 2017-08-11 MED ORDER — ONDANSETRON HCL 4 MG PO TABS: 4.0000 mg | ORAL_TABLET | Freq: Four times a day (QID) | ORAL | Status: DC | PRN

## 2017-08-11 MED ORDER — ATORVASTATIN CALCIUM 20 MG PO TABS
40.0000 mg | ORAL_TABLET | Freq: Every day | ORAL | Status: DC
  Administered 2017-08-12: 40 mg via ORAL
  Filled 2017-08-11: qty 2

## 2017-08-11 MED ORDER — POLYETHYLENE GLYCOL 3350 17 G PO PACK: 17.0000 g | PACK | Freq: Every day | ORAL | Status: DC | PRN

## 2017-08-11 MED ORDER — FUROSEMIDE 10 MG/ML IJ SOLN
80.0000 mg | Freq: Once | INTRAMUSCULAR | Status: AC
  Administered 2017-08-11: 80 mg via INTRAVENOUS
  Filled 2017-08-11: qty 8

## 2017-08-11 MED ORDER — SODIUM CHLORIDE 0.9% FLUSH
3.0000 mL | Freq: Two times a day (BID) | INTRAVENOUS | Status: DC
  Administered 2017-08-11 – 2017-08-14 (×6): 3 mL via INTRAVENOUS

## 2017-08-11 MED ORDER — INSULIN ASPART 100 UNIT/ML ~~LOC~~ SOLN
0.0000 [IU] | Freq: Three times a day (TID) | SUBCUTANEOUS | Status: DC
  Administered 2017-08-11 – 2017-08-12 (×3): 2 [IU] via SUBCUTANEOUS
  Filled 2017-08-11 (×3): qty 1

## 2017-08-11 MED ORDER — INSULIN ASPART 100 UNIT/ML ~~LOC~~ SOLN
0.0000 [IU] | Freq: Every day | SUBCUTANEOUS | Status: DC
  Administered 2017-08-11: 5 [IU] via SUBCUTANEOUS
  Filled 2017-08-11: qty 1

## 2017-08-11 MED ORDER — CARVEDILOL 3.125 MG PO TABS
3.1250 mg | ORAL_TABLET | Freq: Two times a day (BID) | ORAL | Status: DC
  Administered 2017-08-11 – 2017-08-13 (×4): 3.125 mg via ORAL
  Filled 2017-08-11 (×4): qty 1

## 2017-08-11 MED ORDER — HEPARIN SODIUM (PORCINE) 5000 UNIT/ML IJ SOLN: 5000.0000 [IU] | Freq: Three times a day (TID) | INTRAMUSCULAR | Status: DC

## 2017-08-11 MED ORDER — FUROSEMIDE 10 MG/ML IJ SOLN
80.0000 mg | Freq: Two times a day (BID) | INTRAMUSCULAR | Status: DC
  Administered 2017-08-11 – 2017-08-12 (×2): 80 mg via INTRAVENOUS
  Filled 2017-08-11 (×2): qty 8

## 2017-08-11 MED ORDER — ORAL CARE MOUTH RINSE
15.0000 mL | Freq: Two times a day (BID) | OROMUCOSAL | Status: DC
  Administered 2017-08-11 – 2017-08-13 (×3): 15 mL via OROMUCOSAL

## 2017-08-11 MED ORDER — ONDANSETRON HCL 4 MG/2ML IJ SOLN: 4.0000 mg | Freq: Four times a day (QID) | INTRAMUSCULAR | Status: DC | PRN

## 2017-08-11 MED ORDER — ACETAMINOPHEN 650 MG RE SUPP: 650.0000 mg | Freq: Four times a day (QID) | RECTAL | Status: DC | PRN

## 2017-08-11 MED ORDER — ALBUTEROL SULFATE (2.5 MG/3ML) 0.083% IN NEBU: 2.5000 mg | INHALATION_SOLUTION | RESPIRATORY_TRACT | Status: DC | PRN

## 2017-08-11 MED ORDER — TRAZODONE HCL 50 MG PO TABS
50.0000 mg | ORAL_TABLET | Freq: Every day | ORAL | Status: DC
  Administered 2017-08-11 – 2017-08-12 (×2): 50 mg via ORAL
  Filled 2017-08-11 (×2): qty 1

## 2017-08-11 MED ORDER — METHYLPREDNISOLONE SODIUM SUCC 125 MG IJ SOLR
60.0000 mg | INTRAMUSCULAR | Status: DC
  Administered 2017-08-11: 60 mg via INTRAVENOUS
  Filled 2017-08-11: qty 2

## 2017-08-11 MED ORDER — PANTOPRAZOLE SODIUM 40 MG PO TBEC
40.0000 mg | DELAYED_RELEASE_TABLET | Freq: Two times a day (BID) | ORAL | Status: DC
  Administered 2017-08-11 – 2017-08-13 (×2): 40 mg via ORAL
  Filled 2017-08-11 (×5): qty 1

## 2017-08-11 MED ORDER — AMIODARONE HCL 200 MG PO TABS
200.0000 mg | ORAL_TABLET | Freq: Every day | ORAL | Status: DC
  Administered 2017-08-12 – 2017-08-13 (×2): 200 mg via ORAL
  Filled 2017-08-11 (×2): qty 1

## 2017-08-11 MED ORDER — HEPARIN SODIUM (PORCINE) 5000 UNIT/ML IJ SOLN
5000.0000 [IU] | Freq: Three times a day (TID) | INTRAMUSCULAR | Status: DC
  Administered 2017-08-11 – 2017-08-13 (×6): 5000 [IU] via SUBCUTANEOUS
  Filled 2017-08-11 (×6): qty 1

## 2017-08-11 MED ORDER — ACETAMINOPHEN 325 MG PO TABS: 650.0000 mg | ORAL_TABLET | Freq: Four times a day (QID) | ORAL | Status: DC | PRN

## 2017-08-11 NOTE — H&P (Signed)
[facility] [place] NAME: Morgan [name]    MR#:  [id number]  DATE OF BIRTH:  01-06-1935  DATE OF ADMISSION:  08/11/2017  PRIMARY CARE PHYSICIAN: Kirk Ruths, MD   REQUESTING/REFERRING PHYSICIAN: Dr. Mable Paris  CHIEF COMPLAINT:   Chief Complaint  Patient presents with  . Chest Pain  . Shortness of Breath    HISTORY OF PRESENT ILLNESS:  Morgan [name]  is a 82 y.o. female with a known history of EBV, chronic systolic CHF, CKD stage IV, anemia of chronic disease, GI bleed, diabetes, hypertension presents to the emergency room complaining of progressively worsening shortness of breath over a week. She has noticed increased lower extremity swelling, orthopnea. Try to take an extra dose of her torsemide at home without any help. Patient has been found to have saturations in the 80s on oxygen. Extremely tachypneic in the 40s and needs to be started on BiPAP. Her last admission was for a GI bleed thought to be due to diverticulosis and hemorrhoids.   Chest x-ray shows moderate right-sided pleural effusion. She had thoracentesis on the right side in the past. PAST MEDICAL HISTORY:   Past Medical History:  Diagnosis Date  . Abnormal LFTs (liver function tests)   . Adenomatous polyps 03/05/2012  . Angiodysplasia   . Aortic stenosis   . Arthritis   . AV malformation of GI tract   . Bilateral cellulitis of lower leg   . CAD (coronary artery disease)   . Cardiomyopathy ([place])   . Carotid bruit   . Cervical cancer ([place])    cervical cancer history  . CHF (congestive heart failure) ([place])   . Chronic edema   . Clostridium difficile infection March 02, 2015  . Complication of anesthesia     I have trouble waking up "  . COPD (chronic obstructive pulmonary disease) ([place])   . Diabetes mellitus with stage 3 chronic kidney disease ([place])   . Fatty liver   . Fibrocystic breast changes   . GERD (gastroesophageal reflux disease)   . GI bleed    . Gout   . History of blood transfusion   . History of nicotine use   . Hyperlipidemia   . Hypertension   . Iron deficiency anemia 2008  . Irritable bowel syndrome   . Macrocytosis   . Osteoarthritis   . Peptic ulcer disease   . PVD (peripheral vascular disease) ([place])   . Retinal vein occlusion of left eye   . Tobacco abuse   . Trigeminal neuralgia   . Vascular malformation 06/14/1997    PAST SURGICAL HISTORY:   Past Surgical History:  Procedure Laterality Date  . ABDOMINAL HYSTERECTOMY    . angiogram cardiac stenting    . BONE MARROW BIOPSY  1999  . CARDIAC CATHETERIZATION N/A 12/02/2016   Procedure: Left Heart Cath and Coronary Angiography;  Surgeon: Wellington Hampshire, MD;  Location: [place] [facility];  Service: Cardiovascular;  Laterality: N/A;  . CARDIAC CATHETERIZATION N/A 12/02/2016   Procedure: Coronary Balloon Angioplasty;  Surgeon: Wellington Hampshire, MD;  Location: [place] [facility];  Service: Cardiovascular;  Laterality: N/A;  . CHOLECYSTECTOMY    . COLONOSCOPY  12/2014, 2014, 2013, 2008, 2004   03/05/2012-Adenomatous Polyps;09/23/2003-Polyp remains intact;   . COLONOSCOPY N/A 07/12/2016   Procedure: COLONOSCOPY;  Surgeon: Ronald Lobo, MD;  Location: [facility] ENDOSCOPY;  Service: Endoscopy;  Laterality: N/A;  . COLONOSCOPY N/A 08/23/2016   Procedure: COLONOSCOPY;  Surgeon: Lucilla Lame,  MD;  Location: [facility] ENDOSCOPY;  Service: Endoscopy;  Laterality: N/A;  . COLONOSCOPY WITH PROPOFOL N/A 03/26/2017   Procedure: COLONOSCOPY WITH PROPOFOL;  Surgeon: [name], MD;  Location: [facility] ENDOSCOPY;  Service: Endoscopy;  Laterality: N/A;  . Endoscopic Carpal Tunnel Release    . ESOPHAGOGASTRODUODENOSCOPY  12/2014, 2014, 2013, 2009, 2004  . ESOPHAGOGASTRODUODENOSCOPY (EGD) WITH PROPOFOL N/A 07/12/2016   Procedure: ESOPHAGOGASTRODUODENOSCOPY (EGD) WITH PROPOFOL;  Surgeon: Ronald Lobo, MD;  Location: [place];  Service: Endoscopy;  Laterality: N/A;  . ESOPHAGOGASTRODUODENOSCOPY  (EGD) WITH PROPOFOL N/A 08/23/2016   Procedure: ESOPHAGOGASTRODUODENOSCOPY (EGD) WITH PROPOFOL;  Surgeon: Lucilla Lame, MD;  Location: [facility] ENDOSCOPY;  Service: Endoscopy;  Laterality: N/A;  . ESOPHAGOGASTRODUODENOSCOPY (EGD) WITH PROPOFOL N/A 03/25/2017   Procedure: ESOPHAGOGASTRODUODENOSCOPY (EGD) WITH PROPOFOL;  Surgeon: Lucilla Lame, MD;  Location: [facility] ENDOSCOPY;  Service: Endoscopy;  Laterality: N/A;  . HOT HEMOSTASIS N/A 07/12/2016   Procedure: HOT HEMOSTASIS (ARGON PLASMA COAGULATION/BICAP);  Surgeon: Ronald Lobo, MD;  Location: [facility] ENDOSCOPY;  Service: Endoscopy;  Laterality: N/A;  . NASAL SINUS SURGERY    . TEE WITHOUT CARDIOVERSION N/A 12/19/2015   Procedure: TRANSESOPHAGEAL ECHOCARDIOGRAM (TEE);  Surgeon: Yolonda Kida, MD;  Location: [facility] ORS;  Service: Cardiovascular;  Laterality: N/A;  . THORACENTESIS    . TONSILLECTOMY      SOCIAL HISTORY:   Social History  Substance Use Topics  . Smoking status: Former Smoker    Packs/day: 0.50    Years: 56.00    Types: Cigarettes    Quit date: 05/23/2013  . Smokeless tobacco: Never Used  . Alcohol use No    FAMILY HISTORY:   Family History  Problem Relation Age of Onset  . Parkinsonism Mother   . Heart attack Mother   . Lung cancer Father   . Diabetes Mellitus II Father   . Asthma Sister   . Asthma Brother   . Heart disease Son     DRUG ALLERGIES:   Allergies  Allergen Reactions  . Aspirin Other (See Comments)    bleeding  . Atenolol Other (See Comments)    fatigue  . Clopidogrel Bisulfate Other (See Comments)    bleeding  . Darvon [Propoxyphene] Hives and Other (See Comments)  . Hydrochlorothiazide Other (See Comments)    Fatigue and cramps  . Iodinated Diagnostic Agents Other (See Comments)    Questionable GI bleed  . Nylon Hives  . Other Other (See Comments)    Nylon sutures  . Pravastatin Other (See Comments)    Severe cramping    REVIEW OF SYSTEMS:   Review of Systems  Constitutional: Positive for  malaise/fatigue. Negative for chills and fever.  HENT: Negative for sore throat.   Eyes: Negative for blurred vision, double vision and pain.  Respiratory: Positive for cough, sputum production, shortness of breath and wheezing. Negative for hemoptysis.   Cardiovascular: Positive for orthopnea and leg swelling. Negative for palpitations.  Gastrointestinal: Negative for abdominal pain, constipation, diarrhea, heartburn, nausea and vomiting.  Genitourinary: Negative for dysuria and hematuria.  Musculoskeletal: Negative for back pain and joint pain.  Skin: Negative for rash.  Neurological: Positive for weakness. Negative for sensory change, speech change, focal weakness and headaches.  Endo/Heme/Allergies: Does not bruise/bleed easily.  Psychiatric/Behavioral: Negative for depression. The patient is not nervous/anxious.    MEDICATIONS AT HOME:   Prior to Admission medications   Medication Sig Start Date End Date Taking? Authorizing Provider  acetaminophen (TYLENOL) 500 MG tablet Take 500 mg by mouth at bedtime.  [provider]  amiodarone (PACERONE) 100 MG tablet Take 100 mg by mouth daily.    [provider]  amLODipine (NORVASC) 2.5 MG tablet Take 1 tablet (2.5 mg total) by mouth daily. 08/24/16   Hillary Bow, MD  atorvastatin (LIPITOR) 40 MG tablet Take 1 tablet (40 mg total) by mouth daily at 6 PM. 12/06/16   Max Sane, MD  carvedilol (COREG) 3.125 MG tablet Take 3.125 mg by mouth 2 (two) times daily. 04/08/17 04/08/18  [provider]  diclofenac sodium (VOLTAREN) 1 % GEL Apply topically. 11/08/16 11/08/17  [provider]  pantoprazole (PROTONIX) 40 MG tablet Take 40 mg by mouth daily.     [provider]  spironolactone (ALDACTONE) 25 MG tablet Take 25 mg by mouth daily.    [provider]  torsemide (DEMADEX) 100 MG tablet Take 100 mg by mouth daily. 07/23/16   [provider]     VITAL SIGNS:  Blood pressure (!)  99/58, pulse 83, temperature 98.3 F (36.8 C), temperature source Oral, resp. rate (!) 30, weight 73.9 kg (163 lb), SpO2 92 %.  PHYSICAL EXAMINATION:  Physical Exam  GENERAL:  82 y.o.-year-old patient lying in the bed , Respiratory distress. Looks critically ill. EYES: Pupils equal, round, reactive to light and accommodation. No scleral icterus. Extraocular muscles intact.  HEENT: Head atraumatic, normocephalic. Oropharynx and nasopharynx clear. No oropharyngeal erythema, moist oral mucosa  NECK:  Supple, no jugular venous distention. No thyroid enlargement, no tenderness.  LUNGS:  CARDIOVASCULAR: S1, S2 normal. No murmurs, rubs, or gallops.  ABDOMEN: Soft, nontender, nondistended. Bowel sounds present. No organomegaly or mass.  EXTREMITIES: No cyanosis, or clubbing. + 2 pedal & radial pulses b/l. Bilateral lower extremity edema  NEUROLOGIC: Cranial nerves II through XII are intact. No focal Motor or sensory deficits appreciated b/l PSYCHIATRIC: The patient is alert and oriented x 3.  SKIN: No obvious rash, lesion, or ulcer.   LABORATORY PANEL:   CBC  Recent Labs Lab 08/11/17 1046  WBC 8.6  HGB 12.0  HCT 34.8*  PLT 166   ------------------------------------------------------------------------------------------------------------------  Chemistries   Recent Labs Lab 08/11/17 1046  NA 138  K 4.0  CL 95*  CO2 29  GLUCOSE 215*  BUN 71*  CREATININE 3.66*  CALCIUM 8.3*  AST 41  ALT 27  ALKPHOS 145*  BILITOT 2.7*   ------------------------------------------------------------------------------------------------------------------  Cardiac Enzymes  Recent Labs Lab 08/11/17 1046  TROPONINI 0.12*   ------------------------------------------------------------------------------------------------------------------  RADIOLOGY:  Dg Chest Port 1 View  Result Date: 08/11/2017 CLINICAL DATA:  Shortness of breath. EXAM: PORTABLE CHEST 1 VIEW COMPARISON:  09/02/2016 report.  Decubitus chest x-ray 05/31/2013. AP chest 07/10/2012 . FINDINGS: Port-A-Cath noted with tip over the cavoatrial junction. Cardiomegaly with pulmonary vascular prominence and bilateral interstitial prominence suggesting CHF. Prominent right pleural effusion. Prominent rounded density noted over the right upper lung. This could represent loculated pleural fluid, pulmonary infiltrate, or pulmonary mass . Follow-up exams demonstrate clearing suggested . IMPRESSION: 1. Port-A-Cath noted with tip over the cavoatrial junction. 2. Cardiomegaly with pulmonary vascular prominence and bilateral interstitial prominence. Prominent right pleural effusion. Findings suggest CHF. 3. Prominent rounded density noted of the right upper lung. This could represent loculated pleural fluid, pulmonary infiltrate, pulmonary mass. Follow-up exam suggested to demonstrate clearing. Electronically Signed   By: Marcello Moores  Register   On: 08/11/2017 11:22     IMPRESSION AND PLAN:   * Acute hypoxic respiratory failure due to acute on chronic congestive heart failure with moderate right  pleural effusion Start patient on BiPAP. Stat dose of IV Lasix. Discussed with pulmonary and will order an ultrasound-guided right thoracentesis. Admitted to stepdown unit. Patient is critically ill.  * Acute COPD exacerbation Start IV Solu-Medrol and nebulizers. Patient is on BiPAP.  * Diabetes mellitus. Start sliding scale insulin.  * Acute kidney injury over CKD stage IV likely due to cardiorenal syndrome. Monitor while being diuresed. Close monitoring of input and output along with daily weight.  * DVT prophylaxis with subcutaneous heparin  All the records are reviewed and case discussed with ED provider. Management plans discussed with the patient, family and they are in agreement.  CODE STATUS: DNR  Discussed with patient with her healthcare power of attorney son at bedside. She wishes to be DO NOT RESUSCITATE and DO NOT INTUBATE. Orders  entered.  TOTAL TIME TAKING CARE OF THIS PATIENT: 40 minutes.   Hillary Bow R M.D on 08/11/2017 at 11:56 AM  Between 7am to 6pm - Pager - [phone]  After 6pm go to [email or web address] - password [email or web address] [facility]  Office  [phone]  CC: Primary care physician; Kirk Ruths, MD  Note: This dictation was prepared with Dragon dictation along with smaller phrase technology. Any transcriptional errors that result from this process are unintentional.

## 2017-08-11 NOTE — ED Triage Notes (Signed)
Pt arrived via EMS from home for reports of increasing SOB and generalized chest pain for over one week. Pt room air SpO2 on EMS arrival 82%, increased to 93% on 3L. EMS administered 1 NTG. Pt alert and oriented on arrival. Labored breathing noted.

## 2017-08-11 NOTE — Procedures (Signed)
Right thoracentesis without difficulty  Complications:  None  Blood Loss: none  See dictation in canopy pacs

## 2017-08-11 NOTE — Consult Note (Signed)
PULMONARY / CRITICAL CARE MEDICINE   Name: Morgan [name] MRN: [id number] DOB: 1935-06-20    ADMISSION DATE:  08/11/2017  HISTORY OF PRESENT ILLNESS:   88 F with congestive heart failure, CKD, chronic blood loss anemia due to AVMs, hypertension presented to ED with progressive dyspnea 7 days. Also reported increased lower extremity edema and orthopnea. She takes diuretics at home but noted decreasing urine output. Was found to be hypoxemic and tachypneic in the ED and BiPAP initiated. Chest x-ray revealed moderate to large right pleural effusion. On my recommendation, she underwent thoracentesis with market improvement in dyspnea. Presently on [place] O2_0  LPM with no distress. Denies chest pain, cough, purulent sputum, hemoptysis, wheezing, abdominal pain, N/V/D, dysuria.   PAST MEDICAL HISTORY :  She  has a past medical history of Abnormal LFTs (liver function tests); Adenomatous polyps (03/05/2012); Angiodysplasia; Aortic stenosis; Arthritis; AV malformation of GI tract; Bilateral cellulitis of lower leg; CAD (coronary artery disease); Cardiomyopathy ([place]); Carotid bruit; Cervical cancer (HCC); CHF (congestive heart failure) ([place]); Chronic edema; Clostridium difficile infection (March 02, 2015); Complication of anesthesia; COPD (chronic obstructive pulmonary disease) ([place]); Diabetes mellitus with stage 3 chronic kidney disease ([place]); Fatty liver; Fibrocystic breast changes; GERD (gastroesophageal reflux disease); GI bleed; Gout; History of blood transfusion; History of nicotine use; Hyperlipidemia; Hypertension; Iron deficiency anemia (2008); Irritable bowel syndrome; Macrocytosis; Osteoarthritis; Peptic ulcer disease; PVD (peripheral vascular disease) ([place]); Retinal vein occlusion of left eye; Tobacco abuse; Trigeminal neuralgia; and Vascular malformation (06/14/1997).  PAST SURGICAL HISTORY: She  has a past surgical history that includes Abdominal hysterectomy; Bone marrow biopsy (1999); angiogram  cardiac stenting; Thoracentesis; Esophagogastroduodenoscopy (12/2014, 2014, 2013, 2009, 2004); Colonoscopy (12/2014, 2014, 2013, 2008, 2004); Cholecystectomy; Tonsillectomy; Nasal sinus surgery; Endoscopic Carpal Tunnel Release; TEE without cardioversion (N/A, 12/19/2015); Esophagogastroduodenoscopy (egd) with propofol (N/A, 07/12/2016); Colonoscopy (N/A, 07/12/2016); Hot hemostasis (N/A, 07/12/2016); Esophagogastroduodenoscopy (egd) with propofol (N/A, 08/23/2016); Colonoscopy (N/A, 08/23/2016); Cardiac catheterization (N/A, 12/02/2016); Cardiac catheterization (N/A, 12/02/2016); Esophagogastroduodenoscopy (egd) with propofol (N/A, 03/25/2017); and Colonoscopy with propofol (N/A, 03/26/2017).  Allergies  Allergen Reactions  . Aspirin Other (See Comments)    bleeding  . Atenolol Other (See Comments)    fatigue  . Clopidogrel Bisulfate Other (See Comments)    bleeding  . Darvon [Propoxyphene] Hives and Other (See Comments)  . Hydrochlorothiazide Other (See Comments)    Fatigue and cramps  . Iodinated Diagnostic Agents Other (See Comments)    Questionable GI bleed  . Nylon Hives  . Other Other (See Comments)    Nylon sutures  . Pravastatin Other (See Comments)    Severe cramping    Current Facility-Administered Medications on File Prior to Encounter  Medication  . acetaminophen (TYLENOL) tablet 650 mg  . diphenhydrAMINE (BENADRYL) capsule 25 mg  . epoetin alfa (EPOGEN,PROCRIT) injection 20,000 Units  . furosemide (LASIX) injection 20 mg  . heparin lock flush 100 unit/mL  . heparin lock flush 100 unit/mL  . sodium chloride flush (NS) 0.9 % injection 10 mL  . sodium chloride flush (NS) 0.9 % injection 3 mL   Current Outpatient Prescriptions on File Prior to Encounter  Medication Sig  . acetaminophen (TYLENOL) 500 MG tablet Take 500 mg by mouth at bedtime.  Marland Kitchen amiodarone (PACERONE) 200 MG tablet Take 200 mg by mouth daily.   Marland Kitchen amLODipine (NORVASC) 2.5 MG tablet Take 1 tablet (2.5 mg total) by mouth  daily.  Marland Kitchen atorvastatin (LIPITOR) 40 MG tablet Take 1 tablet (40 mg total) by mouth daily at 6 PM.  .  carvedilol (COREG) 3.125 MG tablet Take 3.125 mg by mouth 2 (two) times daily.  . pantoprazole (PROTONIX) 40 MG tablet Take 40 mg by mouth 2 (two) times daily.   Marland Kitchen spironolactone (ALDACTONE) 25 MG tablet Take 25 mg by mouth daily.  . diclofenac sodium (VOLTAREN) 1 % GEL Apply topically.    FAMILY HISTORY:  Her indicated that her mother is deceased. She indicated that her father is deceased. She indicated that the status of her sister is unknown. She indicated that the status of her brother is unknown. She indicated that the status of her son is unknown.    SOCIAL HISTORY: She  reports that she quit smoking about 4 years ago. Her smoking use included Cigarettes. She has a 28.00 pack-year smoking history. She has never used smokeless tobacco. She reports that she does not drink alcohol or use drugs.  REVIEW OF SYSTEMS:   As per history of present illness Otherwise negative  SUBJECTIVE:    VITAL SIGNS: BP (!) 146/58   Pulse 77   Temp 97.6 F (36.4 C) (Oral)   Resp (!) 25   Wt 163 lb (73.9 kg)   SpO2 100%   BMI 28.87 kg/m   HEMODYNAMICS:    VENTILATOR SETTINGS:    INTAKE / OUTPUT: No intake/output data recorded.  PHYSICAL EXAMINATION: General:  NAD Neuro:  No focal deficits HEENT: NCAT, sclerae white Cardiovascular:  Regular, 3/6 systolic murmur radiating to neck consistent with ALS Lungs: Slightly diminished in right base, no wheezes Abdomen: Soft, NABS Extremities: Symmetric 3+ pretibial, ankle, pedal edema  LABS:  BMET  Recent Labs Lab 08/11/17 1046  NA 138  K 4.0  CL 95*  CO2 29  BUN 71*  CREATININE 3.66*  GLUCOSE 215*    Electrolytes  Recent Labs Lab 08/11/17 1046  CALCIUM 8.3*    CBC  Recent Labs Lab 08/11/17 1046  WBC 8.6  HGB 12.0  HCT 34.8*  PLT 166    Coag's No results for input(s): APTT, INR in the last 168 hours.  Sepsis  Markers No results for input(s): LATICACIDVEN, PROCALCITON, O2SATVEN in the last 168 hours.  ABG No results for input(s): PHART, PCO2ART, PO2ART in the last 168 hours.  Liver Enzymes  Recent Labs Lab 08/11/17 1046  AST 41  ALT 27  ALKPHOS 145*  BILITOT 2.7*  ALBUMIN 2.8*    Cardiac Enzymes  Recent Labs Lab 08/11/17 1046  TROPONINI 0.12*    Glucose  Recent Labs Lab 08/11/17 1318 08/11/17 1637  GLUCAP 212* 198*    Imaging Dg Chest 1 View  Result Date: 08/11/2017 CLINICAL DATA:  Status post right thoracentesis EXAM: CHEST 1 VIEW COMPARISON:  08/11/2017 FINDINGS: Cardiac shadow is again enlarged. Aortic calcifications and mitral valve calcifications are noted. Right chest wall port is again seen and stable. There is been significant reduction in right-sided pleural effusion. A small amount of fluid remains. No pneumothorax is seen. There remains a pleural-based rounded density in the right apex which is stable from the recent exam. This remains suspicious for neoplasm an when the patient's condition improves a CT of the chest is recommended. IMPRESSION: No pneumothorax following thoracentesis on the right. A small residual effusion remains. Rounded masslike density in the right apex suspicious for underlying neoplasm given the effusion. CT of the chest with contrast when the patient's condition improves is recommended. Electronically Signed   By: Inez Catalina M.D.   On: 08/11/2017 16:50   Dg Chest [facility] 1 View  Result  Date: 08/11/2017 CLINICAL DATA:  Shortness of breath. EXAM: PORTABLE CHEST 1 VIEW COMPARISON:  09/02/2016 report. Decubitus chest x-ray 05/31/2013. AP chest 07/10/2012 . FINDINGS: Port-A-Cath noted with tip over the cavoatrial junction. Cardiomegaly with pulmonary vascular prominence and bilateral interstitial prominence suggesting CHF. Prominent right pleural effusion. Prominent rounded density noted over the right upper lung. This could represent loculated pleural  fluid, pulmonary infiltrate, or pulmonary mass . Follow-up exams demonstrate clearing suggested . IMPRESSION: 1. Port-A-Cath noted with tip over the cavoatrial junction. 2. Cardiomegaly with pulmonary vascular prominence and bilateral interstitial prominence. Prominent right pleural effusion. Findings suggest CHF. 3. Prominent rounded density noted of the right upper lung. This could represent loculated pleural fluid, pulmonary infiltrate, pulmonary mass. Follow-up exam suggested to demonstrate clearing. Electronically Signed   By: Marcello Moores  Register   On: 08/11/2017 11:22   US Thoracentesis Asp Pleural Space W/img Guide  Result Date: 08/11/2017 INDICATION: Right pleural effusion EXAM: ULTRASOUND GUIDED RIGHT THORACENTESIS MEDICATIONS: None. COMPLICATIONS: None immediate. PROCEDURE: An ultrasound guided thoracentesis was thoroughly discussed with the patient and questions answered. The benefits, risks, alternatives and complications were also discussed. The patient understands and wishes to proceed with the procedure. Written consent was obtained. Ultrasound was performed to localize and mark an adequate pocket of fluid in the right chest. The area was then prepped and draped in the normal sterile fashion. 1% Lidocaine was used for local anesthesia. Under ultrasound guidance a 6 Fr Safe-T-Centesis catheter was introduced. Thoracentesis was performed. The catheter was removed and a dressing applied. FINDINGS: A total of approximately 1.1 L of dark amber fluid was removed. Samples were sent to the laboratory as requested by the clinical team. IMPRESSION: Successful ultrasound guided right thoracentesis yielding 1.1 L of pleural fluid. Electronically Signed   By: Inez Catalina M.D.   On: 08/11/2017 16:46    ASSESSMENT / PLAN: Acute hypoxemic respiratory failure due to pulmonary edema and right pleural effusion Dilated CM (LVEF 30-35% 12/02/16) Moderate aortic stenosis by TTE 12/02/16 AKI/CKD DNR - confirmed  with patient  Pulmonary edema is on the basis of both cardiac and renal disease. This is going to be very challenging to manage. She is much improved after thoracentesis. Diuretics have been ordered. I am concerned about worsening renal function. I discussed possible dialysis with her and she wishes to forgo such therapy.  We will watch her in the ICU/SDU overnight. If she does not require any further BiPAP, she may be transferred out of ICU/SDU in a.m. 08/21  Her care plan has been reviewed and I have not made any major changes  Merton Border, MD PCCM service Mobile [phone] Pager [phone] 08/11/2017 6:07 PM

## 2017-08-11 NOTE — ED Provider Notes (Signed)
[facility] Emergency Department Provider Note  ____________________________________________   First MD Initiated Contact with Patient 08/11/17 1043     (approximate)  I have reviewed the triage vital signs and the nursing notes.   HISTORY  Chief Complaint Chest Pain and Shortness of Breath    HPI Morgan [name] is a 82 y.o. female who comes to the emergency department with 1 week of progressive shortness of breath. Today she felt worsening fatigue at home so she called 911. When EMS arrived she had an oxygen saturation of 82% but perked up to 93% on nasal cannula. She has a complex past medical history including severe coronary artery disease as well as congestive heart failure. Her last known cardiac catheterization and echocardiogram or below. She reports no recent change in any of her medications.   12/02/16 Echo: Study Conclusions  - Left ventricle: Wall thickness was increased in a pattern of   moderate LVH. Systolic function was moderately to severely   reduced. The estimated ejection fraction was in the range of 30%   to 35%. - Aortic valve: There was moderate stenosis. Valve area (VTI): 0.6   cm^2. Valve area (Vmax): 0.62 cm^2. Valve area (Vmean): 0.66   cm^2. - Mitral valve: Calcified annulus. Mildly thickened leaflets . The   findings are consistent with mild to moderate stenosis. There was   mild regurgitation. - Left atrium: The atrium was mildly dilated. - Right ventricle: The cavity size was mildly dilated. - Right atrium: The atrium was mildly dilated.  12/02/16 Cath:   There is moderate aortic valve stenosis.  Ost RPDA to RPDA lesion, 10 %stenosed.  RPDA lesion, 99 %stenosed.  Prox RCA to Mid RCA lesion, 100 %stenosed.  Post intervention, there is a 15% residual stenosis.   1. Acute inferior ST elevation myocardial infarction due to thrombotic occlusion of previously placed stents in the proximal to midsegment. The left  system has no obstructive disease. 2. Moderate aortic stenosis with a peak to peak gradient of 14 mmHg. Left ventricular angiography was not performed due to chronic kidney disease. 3. Successful balloon angioplasty of the right coronary artery. I elected not to add another layer of stent in what seems to be an area of overlapped stents.  Recommendations: This is a difficult situation overall. The patient has history of recurrent GI bleed. She should be treated with aspirin and Plavix for at least one month and if tolerated she should stay on at least one agent after that. The patient had difficulty swallowing antiplatelet medications and she is at high risk for acute stent thrombosis. Unfortunately, she is not a good candidate for a 2 B 3A inhibitor due to prohibitive bleeding risk.   Past Medical History:  Diagnosis Date  . Abnormal LFTs (liver function tests)   . Adenomatous polyps 03/05/2012  . Angiodysplasia   . Aortic stenosis   . Arthritis   . AV malformation of GI tract   . Bilateral cellulitis of lower leg   . CAD (coronary artery disease)   . Cardiomyopathy ([place])   . Carotid bruit   . Cervical cancer ([place])    cervical cancer history  . CHF (congestive heart failure) ([place])   . Chronic edema   . Clostridium difficile infection March 02, 2015  . Complication of anesthesia     I have trouble waking up "  . COPD (chronic obstructive pulmonary disease) ([place])   . Diabetes mellitus with stage 3 chronic kidney disease ([place])   .  Fatty liver   . Fibrocystic breast changes   . GERD (gastroesophageal reflux disease)   . GI bleed   . Gout   . History of blood transfusion   . History of nicotine use   . Hyperlipidemia   . Hypertension   . Iron deficiency anemia 2008  . Irritable bowel syndrome   . Macrocytosis   . Osteoarthritis   . Peptic ulcer disease   . PVD (peripheral vascular disease) ([place])   . Retinal vein occlusion of left eye   . Tobacco abuse   . Trigeminal  neuralgia   . Vascular malformation 06/14/1997    Patient Active Problem List   Diagnosis Date Noted  . CHF (congestive heart failure) ([place]) 08/11/2017  . Heme positive stool   . DNR (do not resuscitate) discussion   . Acute GI bleeding 03/24/2017  . Hypotension 12/22/2016  . Hyperkalemia 12/22/2016  . Palliative care encounter   . Goals of care, counseling/discussion   . Pressure injury of skin 12/03/2016  . ST elevation myocardial infarction involving right coronary artery ([place]) 12/02/2016  . ST elevation myocardial infarction (STEMI) of inferior wall ([place]) 12/02/2016  . Anemia due to stage 3 chronic kidney disease 09/26/2016  . Iron deficiency anemia   . Acute posthemorrhagic anemia   . Blood in stool   . Benign neoplasm of colon   . Angiodysplasia of intestine with hemorrhage   . GIB (gastrointestinal bleeding) 08/22/2016  . AKI (acute kidney injury) ([place]) 08/22/2016  . Rectal bleeding   . Pressure ulcer 07/10/2016  . Thrombocytopenia ([place]) 07/09/2016  . Anemia of chronic kidney failure 06/24/2016  . Iron deficiency anemia due to chronic blood loss 04/29/2016  . Nonrheumatic mitral valve insufficiency 11/23/2015  . Non-rheumatic mitral valve stenosis 11/23/2015  . Angiodysplasia 05/19/2015  . Cervical cancer ([place]) 05/19/2015  . Fatty liver 05/19/2015  . Macrocytosis 05/19/2015  . PUD (peptic ulcer disease) 05/19/2015  . Pure hypercholesterolemia 03/06/2015  . Chronic edema 12/27/2014  . Diabetes ([place]) 10/25/2014  . Anemia, unspecified 06/18/2014  . Nonrheumatic aortic valve stenosis 06/18/2014  . Ataxia 06/18/2014  . Coronary artery disease involving native coronary artery of native heart without angina pectoris 06/18/2014  . Cardiomyopathy ([place]) 06/18/2014  . Chronic kidney disease (CKD), stage III (moderate) 06/18/2014  . COPD (chronic obstructive pulmonary disease) ([place]) 06/18/2014  . Type 2 diabetes mellitus with stage 4 chronic kidney disease, without long-term  current use of insulin ([place]) 06/18/2014  . Gout 06/18/2014  . Benign hypertension 06/18/2014  . Trigeminal neuralgia 06/18/2014  . DM (diabetes mellitus) type II controlled, neurological manifestation ([place]) 06/18/2014  . Essential hypertension 06/18/2014    Past Surgical History:  Procedure Laterality Date  . ABDOMINAL HYSTERECTOMY    . angiogram cardiac stenting    . BONE MARROW BIOPSY  1999  . CARDIAC CATHETERIZATION N/A 12/02/2016   Procedure: Left Heart Cath and Coronary Angiography;  Surgeon: Wellington Hampshire, MD;  Location: [facility];  Service: Cardiovascular;  Laterality: N/A;  . CARDIAC CATHETERIZATION N/A 12/02/2016   Procedure: Coronary Balloon Angioplasty;  Surgeon: Wellington Hampshire, MD;  Location: [facility];  Service: Cardiovascular;  Laterality: N/A;  . CHOLECYSTECTOMY    . COLONOSCOPY  12/2014, 2014, 2013, 2008, 2004   03/05/2012-Adenomatous Polyps;09/23/2003-Polyp remains intact;   . COLONOSCOPY N/A 07/12/2016   Procedure: COLONOSCOPY;  Surgeon: Ronald Lobo, MD;  Location: [facility] ENDOSCOPY;  Service: Endoscopy;  Laterality: N/A;  . COLONOSCOPY N/A 08/23/2016   Procedure: COLONOSCOPY;  Surgeon: Lucilla Lame, MD;  Location: [facility] ENDOSCOPY;  Service: Endoscopy;  Laterality: N/A;  . COLONOSCOPY WITH PROPOFOL N/A 03/26/2017   Procedure: COLONOSCOPY WITH PROPOFOL;  Surgeon: [name], MD;  Location: [facility] ENDOSCOPY;  Service: Endoscopy;  Laterality: N/A;  . Endoscopic Carpal Tunnel Release    . ESOPHAGOGASTRODUODENOSCOPY  12/2014, 2014, 2013, 2009, 2004  . ESOPHAGOGASTRODUODENOSCOPY (EGD) WITH PROPOFOL N/A 07/12/2016   Procedure: ESOPHAGOGASTRODUODENOSCOPY (EGD) WITH PROPOFOL;  Surgeon: Ronald Lobo, MD;  Location: [facility];  Service: Endoscopy;  Laterality: N/A;  . ESOPHAGOGASTRODUODENOSCOPY (EGD) WITH PROPOFOL N/A 08/23/2016   Procedure: ESOPHAGOGASTRODUODENOSCOPY (EGD) WITH PROPOFOL;  Surgeon: Lucilla Lame, MD;  Location: [facility] ENDOSCOPY;  Service: Endoscopy;   Laterality: N/A;  . ESOPHAGOGASTRODUODENOSCOPY (EGD) WITH PROPOFOL N/A 03/25/2017   Procedure: ESOPHAGOGASTRODUODENOSCOPY (EGD) WITH PROPOFOL;  Surgeon: Lucilla Lame, MD;  Location: [facility] ENDOSCOPY;  Service: Endoscopy;  Laterality: N/A;  . HOT HEMOSTASIS N/A 07/12/2016   Procedure: HOT HEMOSTASIS (ARGON PLASMA COAGULATION/BICAP);  Surgeon: Ronald Lobo, MD;  Location: [facility] ENDOSCOPY;  Service: Endoscopy;  Laterality: N/A;  . NASAL SINUS SURGERY    . TEE WITHOUT CARDIOVERSION N/A 12/19/2015   Procedure: TRANSESOPHAGEAL ECHOCARDIOGRAM (TEE);  Surgeon: Yolonda Kida, MD;  Location: [facility] ORS;  Service: Cardiovascular;  Laterality: N/A;  . THORACENTESIS    . TONSILLECTOMY      Prior to Admission medications   Medication Sig Start Date End Date Taking? Authorizing Provider  acetaminophen (TYLENOL) 500 MG tablet Take 500 mg by mouth at bedtime.   Yes [provider]  amiodarone (PACERONE) 200 MG tablet Take 200 mg by mouth daily.    Yes [provider]  amLODipine (NORVASC) 2.5 MG tablet Take 1 tablet (2.5 mg total) by mouth daily. 08/24/16  Yes Hillary Bow, MD  atorvastatin (LIPITOR) 40 MG tablet Take 1 tablet (40 mg total) by mouth daily at 6 PM. 12/06/16  Yes Max Sane, MD  carvedilol (COREG) 3.125 MG tablet Take 3.125 mg by mouth 2 (two) times daily. 04/08/17 04/08/18 Yes [provider]  metolazone (ZAROXOLYN) 10 MG tablet Take 10 mg by mouth daily as needed.   Yes [provider]  pantoprazole (PROTONIX) 40 MG tablet Take 40 mg by mouth 2 (two) times daily.    Yes [provider]  spironolactone (ALDACTONE) 25 MG tablet Take 25 mg by mouth daily.   Yes [provider]  traZODone (DESYREL) 50 MG tablet Take 50 mg by mouth at bedtime.   Yes [provider]  diclofenac sodium (VOLTAREN) 1 % GEL Apply topically. 11/08/16 11/08/17  [provider]    Allergies Aspirin; Atenolol; Clopidogrel bisulfate; Darvon [propoxyphene];  Hydrochlorothiazide; Iodinated diagnostic agents; Nylon; Other; and Pravastatin  Family History  Problem Relation Age of Onset  . Parkinsonism Mother   . Heart attack Mother   . Lung cancer Father   . Diabetes Mellitus II Father   . Asthma Sister   . Asthma Brother   . Heart disease Son     Social History Social History  Substance Use Topics  . Smoking status: Former Smoker    Packs/day: 0.50    Years: 56.00    Types: Cigarettes    Quit date: 05/23/2013  . Smokeless tobacco: Never Used  . Alcohol use No    Review of Systems Constitutional: No fever/chills Eyes: No visual changes. ENT: No sore throat. Cardiovascular: Positive chest pain. Respiratory: Positive shortness of breath. Gastrointestinal: No abdominal pain.  No nausea, no vomiting.  No diarrhea.  No constipation. Genitourinary:  Negative for dysuria. Musculoskeletal: Negative for back pain. Skin: Negative for rash. Neurological: Negative for headaches, focal weakness or numbness.   ____________________________________________   PHYSICAL EXAM:  VITAL SIGNS: ED Triage Vitals  Enc Vitals Group     BP --      Pulse --      Resp --      Temp --      Temp src --      SpO2 --      Weight 08/11/17 1037 163 lb (73.9 kg)     Height --      Head Circumference --      Peak Flow --      Pain Score 08/11/17 1036 3     Pain Loc --      Pain Edu? --      Excl. in [place]? --     Constitutional: Speaks in short gasps in severe respiratory distress appears extremely uncomfortable Eyes: PERRL EOMI. Head: Atraumatic. Nose: No congestion/rhinnorhea. Mouth/Throat: No trismus Neck: No stridor.   Cardiovascular: Tachycardic rate, regular rhythm. Grossly normal heart sounds.  Unable to lie flat whatsoever Respiratory: Severe respiratory distress decreased air entry on the right wheezing throughout Gastrointestinal: Soft nontender Musculoskeletal: 3+ pitting edema to the knees bilaterally legs are equal in  size Neurologic:   No gross focal neurologic deficits are appreciated. Skin:  Skin is warm, dry and intact. No rash noted. Psychiatric: Anxious appearing    ____________________________________________   DIFFERENTIAL includes but not limited to  Fluid overload, pneumothorax, congestive heart failure, pulmonary embolism, pleural effusion ____________________________________________   LABS (all labs ordered are listed, but only abnormal results are displayed)  Labs Reviewed  BASIC METABOLIC PANEL - Abnormal; Notable for the following:       Result Value   Chloride 95 (*)    Glucose, Bld 215 (*)    BUN 71 (*)    Creatinine, Ser 3.66 (*)    Calcium 8.3 (*)    GFR calc non Af Amer 11 (*)    GFR calc Af Amer 13 (*)    All other components within normal limits  HEPATIC FUNCTION PANEL - Abnormal; Notable for the following:    Albumin 2.8 (*)    Alkaline Phosphatase 145 (*)    Total Bilirubin 2.7 (*)    Bilirubin, Direct 1.0 (*)    Indirect Bilirubin 1.7 (*)    All other components within normal limits  TROPONIN I - Abnormal; Notable for the following:    Troponin I 0.12 (*)    All other components within normal limits  BRAIN NATRIURETIC PEPTIDE - Abnormal; Notable for the following:    B Natriuretic Peptide 424.0 (*)    All other components within normal limits  CBC WITH DIFFERENTIAL/PLATELET - Abnormal; Notable for the following:    RBC 3.51 (*)    HCT 34.8 (*)    MCH 34.2 (*)    Neutro Abs 7.2 (*)    Lymphs Abs 0.3 (*)    Monocytes Absolute 1.0 (*)    All other components within normal limits  HEMOGLOBIN A1C    Elevated troponin brain natruretic peptide consistent with fluid overload __________________________________________  EKG  ED ECG REPORT I, Darel Hong, the attending physician, personally viewed and interpreted this ECG.  Date: 08/11/2017  Rate: 83 Rhythm: normal sinus rhythm QRS Axis: normal Intervals: normal ST/T Wave abnormalities:  normal Narrative Interpretation: Wavy baseline although no acute abnormalities appreciated  ____________________________________________  RADIOLOGY  Chest x-ray with large  right-sided pleural effusion as well as pulmonary congestion   PROCEDURES  Procedure(s) performed: no  Procedures  Critical Care performed: yes CRITICAL CARE Performed by: Darel Hong   Total critical care time: 35 minutes  Critical care time was exclusive of separately billable procedures and treating other patients.  Critical care was necessary to treat or prevent imminent or life-threatening deterioration.  Critical care was time spent personally by me on the following activities: development of treatment plan with patient and/or surrogate as well as nursing, discussions with consultants, evaluation of patient's response to treatment, examination of patient, obtaining history from patient or surrogate, ordering and performing treatments and interventions, ordering and review of laboratory studies, ordering and review of radiographic studies, pulse oximetry and re-evaluation of patient's condition.   Observation: no ____________________________________________   INITIAL IMPRESSION / ASSESSMENT AND PLAN / ED COURSE  Pertinent labs & imaging results that were available during my care of the patient were reviewed by me and considered in my medical decision making (see chart for details).  The patient arrives breathing 40-50 times a minute speaking in short gasps secondary to respiratory distress. She has known congestive heart failure and reports no changes in her symptoms. She sleeps nearly sitting up and is unable to lie flat now. The patient's blood pressure is somewhat low so I will start with BiPAP rather than nitroglycerin. Regardless given her respiratory status she requires inpatient admission for aggressive diuresis and medical optimization. I will give her 80 mg of Lasix now to start.       ____________________________________________   FINAL CLINICAL IMPRESSION(S) / ED DIAGNOSES  Final diagnoses:  Respiratory distress  Acute on chronic systolic congestive heart failure (HCC)      NEW MEDICATIONS STARTED DURING THIS VISIT:  New Prescriptions   No medications on file     Note:  This document was prepared using Dragon voice recognition software and may include unintentional dictation errors.     Darel Hong, MD 08/11/17 1301

## 2017-08-12 ENCOUNTER — Inpatient Hospital Stay: Payer: Medicare Other

## 2017-08-12 ENCOUNTER — Encounter: Payer: Self-pay | Admitting: General Practice

## 2017-08-12 DIAGNOSIS — J910 Malignant pleural effusion: Secondary | ICD-10-CM

## 2017-08-12 DIAGNOSIS — Z7189 Other specified counseling: Secondary | ICD-10-CM

## 2017-08-12 DIAGNOSIS — J90 Pleural effusion, not elsewhere classified: Secondary | ICD-10-CM

## 2017-08-12 DIAGNOSIS — I350 Nonrheumatic aortic (valve) stenosis: Secondary | ICD-10-CM

## 2017-08-12 DIAGNOSIS — R918 Other nonspecific abnormal finding of lung field: Secondary | ICD-10-CM

## 2017-08-12 DIAGNOSIS — I420 Dilated cardiomyopathy: Secondary | ICD-10-CM

## 2017-08-12 DIAGNOSIS — Z515 Encounter for palliative care: Secondary | ICD-10-CM

## 2017-08-12 DIAGNOSIS — I5023 Acute on chronic systolic (congestive) heart failure: Secondary | ICD-10-CM

## 2017-08-12 DIAGNOSIS — J9801 Acute bronchospasm: Secondary | ICD-10-CM

## 2017-08-12 LAB — BASIC METABOLIC PANEL
ANION GAP: 13 (ref 5–15)
BUN: 77 mg/dL — ABNORMAL HIGH (ref 6–20)
CALCIUM: 8 mg/dL — AB (ref 8.9–10.3)
CHLORIDE: 97 mmol/L — AB (ref 101–111)
CO2: 26 mmol/L (ref 22–32)
Creatinine, Ser: 3.5 mg/dL — ABNORMAL HIGH (ref 0.44–1.00)
GFR, EST AFRICAN AMERICAN: 13 mL/min — AB (ref >60)
GFR, EST NON AFRICAN AMERICAN: 11 mL/min — AB (ref >60)
GLUCOSE: 175 mg/dL — AB (ref 65–99)
Potassium: 4.1 mmol/L (ref 3.5–5.1)
Sodium: 136 mmol/L (ref 135–145)

## 2017-08-12 LAB — CBC
HCT: 29.9 % — ABNORMAL LOW (ref 35.0–47.0)
HEMOGLOBIN: 10.9 g/dL — AB (ref 12.0–16.0)
MCH: 35.8 pg — ABNORMAL HIGH (ref 26.0–34.0)
MCHC: 36.3 g/dL — AB (ref 32.0–36.0)
MCV: 98.5 fL (ref 80.0–100.0)
Platelets: 120 10*3/uL — ABNORMAL LOW (ref 150–440)
RBC: 3.03 MIL/uL — AB (ref 3.80–5.20)
RDW: 14.1 % (ref 11.5–14.5)
WBC: 4.4 10*3/uL (ref 3.6–11.0)

## 2017-08-12 LAB — GLUCOSE, CAPILLARY
GLUCOSE-CAPILLARY: 151 mg/dL — AB (ref 65–99)
GLUCOSE-CAPILLARY: 175 mg/dL — AB (ref 65–99)
Glucose-Capillary: 110 mg/dL — ABNORMAL HIGH (ref 65–99)
Glucose-Capillary: 135 mg/dL — ABNORMAL HIGH (ref 65–99)

## 2017-08-12 MED ORDER — IPRATROPIUM-ALBUTEROL 0.5-2.5 (3) MG/3ML IN SOLN
3.0000 mL | Freq: Four times a day (QID) | RESPIRATORY_TRACT | Status: DC
  Administered 2017-08-12 – 2017-08-13 (×5): 3 mL via RESPIRATORY_TRACT
  Filled 2017-08-12 (×6): qty 3

## 2017-08-12 MED ORDER — BUDESONIDE 0.25 MG/2ML IN SUSP
0.2500 mg | Freq: Four times a day (QID) | RESPIRATORY_TRACT | Status: DC
  Administered 2017-08-12 – 2017-08-13 (×5): 0.25 mg via RESPIRATORY_TRACT
  Filled 2017-08-12 (×6): qty 2

## 2017-08-12 MED ORDER — ALBUTEROL SULFATE (2.5 MG/3ML) 0.083% IN NEBU: 2.5000 mg | INHALATION_SOLUTION | RESPIRATORY_TRACT | Status: DC | PRN

## 2017-08-12 NOTE — Consult Note (Signed)
Consultation Note Date: 08/12/2017   Patient Name: Morgan [name]  DOB: Mar 09, 1935  MRN: [id number]  Age / Sex: 82 y.o., female  PCP: Kirk Ruths, MD Referring Physician: Fritzi Mandes, MD  Reason for Consultation: Establishing goals of care  HPI/Patient Profile: 82 y.o. female  with past medical history of CHF, CKD stg IV, GIB (AVMs), aortic stenosis, and DM who was admitted on 08/11/2017 with hypoxic respiratory failure.  Work up revealed COPD exacerbation and pleural effusion.  Thoracentesis removed 1.1 liters of fluid.  Post thoracentesis CXR show a possible right upper lobe mass.  Final cytology on the pleural fluid is pending.   Clinical Assessment and Goals of Care:  I have reviewed medical records including EPIC notes, labs and imaging, received report from the attending Hospitalist, assessed the patient and then met at the bedside along with her sister to discuss diagnosis prognosis, GOC, EOL wishes, disposition and options.  I was fortunate to have met Morgan [name] and her family during her hospitalization in December.  I re-introduced Palliative Medicine as specialized medical care for people living with serious illness. It focuses on providing relief from the symptoms and stress of a serious illness. The goal is to improve quality of life for both the patient and the family.  We discussed a brief life review of the patient. She lives with her son.  She is able to walk (and really wants to get out of bed now).  She has had a lot of weight loss but is looking forward to eating soft foods.  Her dentition is very poor.  We discussed her current illness.  She is waiting on her CT Chest to be done. She is aware of the lung mass and tells me very quickly that she does not want to be intubated or receive CPR. I attempted to elicit values and goals of care important to the patient.  Her minimum acceptable  quality of life is to be able to interact with her loved ones "I want to be able to love them", and not to be "down in the bed".  She much prefers to be at home with her son if possible but I believe she would accept a short stent at rehab if it was absolutely necessary.  Questions and concerns were addressed.    Primary Decision Maker:  PATIENT.  Her HCPOA is her son Morgan [name].    SUMMARY OF RECOMMENDATIONS     Patient states she is a DNR (No intubation or chest compressions)  PT consult ordered - patient wants to get out of bed and move.  She does not want to become weak  PMT will follow with you.  Code Status/Advance Care Planning: DNR  Symptom Management:   Per primary team  PT eval   Psycho-social/Spiritual:   Desire for further Chaplaincy support: yes  Prognosis:   To be determined.  Discharge Planning: To Be Determined      Primary Diagnoses: Present on Admission: **None**   I have reviewed the medical  record, interviewed the patient and family, and examined the patient. The following aspects are pertinent.  Past Medical History:  Diagnosis Date  . Abnormal LFTs (liver function tests)   . Adenomatous polyps 03/05/2012  . Angiodysplasia   . Aortic stenosis   . Arthritis   . AV malformation of GI tract   . Bilateral cellulitis of lower leg   . CAD (coronary artery disease)   . Cardiomyopathy ([place])   . Carotid bruit   . Cervical cancer ([place])    cervical cancer history  . CHF (congestive heart failure) ([place])   . Chronic edema   . Clostridium difficile infection March 02, 2015  . Complication of anesthesia     I have trouble waking up "  . COPD (chronic obstructive pulmonary disease) ([place])   . Diabetes mellitus with stage 3 chronic kidney disease ([place])   . Fatty liver   . Fibrocystic breast changes   . GERD (gastroesophageal reflux disease)   . GI bleed   . Gout   . History of blood transfusion   . History of nicotine use   .  Hyperlipidemia   . Hypertension   . Iron deficiency anemia 2008  . Irritable bowel syndrome   . Macrocytosis   . Osteoarthritis   . Peptic ulcer disease   . PVD (peripheral vascular disease) ([place])   . Retinal vein occlusion of left eye   . Tobacco abuse   . Trigeminal neuralgia   . Vascular malformation 06/14/1997   Social History   Social History  . Marital status: Married    Spouse name: N/A  . Number of children: N/A  . Years of education: N/A   Social History Main Topics  . Smoking status: Former Smoker    Packs/day: 0.50    Years: 56.00    Types: Cigarettes    Quit date: 05/23/2013  . Smokeless tobacco: Never Used  . Alcohol use No  . Drug use: No  . Sexual activity: No   Other Topics Concern  . None   Social History Narrative  . None   Family History  Problem Relation Age of Onset  . Parkinsonism Mother   . Heart attack Mother   . Lung cancer Father   . Diabetes Mellitus II Father   . Asthma Sister   . Asthma Brother   . Heart disease Son    Scheduled Meds: . amiodarone  200 mg Oral Daily  . atorvastatin  40 mg Oral q1800  . budesonide (PULMICORT) nebulizer solution  0.25 mg Nebulization Q6H  . carvedilol  3.125 mg Oral BID  . heparin  5,000 Units Subcutaneous Q8H  . insulin aspart  0-5 Units Subcutaneous QHS  . insulin aspart  0-9 Units Subcutaneous TID WC  . ipratropium-albuterol  3 mL Nebulization Q6H  . mouth rinse  15 mL Mouth Rinse BID  . pantoprazole  40 mg Oral BID  . sodium chloride flush  3 mL Intravenous Q12H  . traZODone  50 mg Oral QHS   Continuous Infusions: PRN Meds:.acetaminophen **OR** acetaminophen, albuterol, ondansetron **OR** ondansetron (ZOFRAN) IV, polyethylene glycol Allergies  Allergen Reactions  . Aspirin Other (See Comments)    bleeding  . Atenolol Other (See Comments)    fatigue  . Clopidogrel Bisulfate Other (See Comments)    bleeding  . Darvon [Propoxyphene] Hives and Other (See Comments)  . Hydrochlorothiazide  Other (See Comments)    Fatigue and cramps  . Iodinated Diagnostic Agents Other (See Comments)  Questionable GI bleed  . Nylon Hives  . Other Other (See Comments)    Nylon sutures  . Pravastatin Other (See Comments)    Severe cramping   Review of Systems No constipation, no pain, no current SOB (after thoracentesis).    Physical Exam  Very pleasant, thin, elderly female, poor dentition CV rrr with 3/6 murmur Resp no distress Abdomen soft, nt, nd, thin   Vital Signs: BP 123/65   Pulse 79   Temp 98.1 F (36.7 C) (Oral)   Resp (!) 25   Ht 5' 6"  (1.676 m)   Wt 71.8 kg (158 lb 4.6 oz)   SpO2 91%   BMI 25.55 kg/m  Pain Assessment: No/denies pain   Pain Score: 0-No pain   SpO2: SpO2: 91 % O2 Device:SpO2: 91 % O2 Flow Rate: .O2 Flow Rate (L/min): 3 L/min  IO: Intake/output summary:  Intake/Output Summary (Last 24 hours) at 08/12/17 1441 Last data filed at 08/12/17 0558  Gross per 24 hour  Intake                0 ml  Output              450 ml  Net             -450 ml    LBM: Last BM Date: 08/11/17 Baseline Weight: Weight: 73.9 kg (163 lb) Most recent weight: Weight: 71.8 kg (158 lb 4.6 oz)     Palliative Assessment/Data:   Flowsheet Rows     Most Recent Value  Intake Tab  Referral Department  Hospitalist  Unit at Time of Referral  ICU  Palliative Care Primary Diagnosis  Other (Comment)  Date Notified  08/12/17  Palliative Care Type  New Palliative care  Reason for referral  Clarify Goals of Care  Date of Admission  08/11/17  Date first seen by Palliative Care  08/12/17  # of days Palliative referral response time  0 Day(s)  # of days IP prior to Palliative referral  1  Clinical Assessment  Palliative Performance Scale Score  50%  Dyspnea Max Last 24 Hours  7  Dyspnea Min Last 24 hours  4  Psychosocial & Spiritual Assessment  Palliative Care Outcomes  Patient/Family meeting held?  Yes  Who was at the meeting?  patient and her sister  Palliative Care  Outcomes  Clarified goals of care      Time In: 2:00 Time Out: 3:00 Time Total: 60 min. Greater than 50%  of this time was spent counseling and coordinating care related to the above assessment and plan.  Signed by: Florentina Jenny, PA-C Palliative Medicine Pager: [phone]  Please contact Palliative Medicine Team phone at [phone] for questions and concerns.  For individual provider: See Shea Evans

## 2017-08-12 NOTE — Progress Notes (Signed)
[place] received an OR to provide spiritual support for Morgan [name] and family. [place] reported to room and found Morgan [name] and her son Morgan [name] in room. [place] tried to encourage Morgan [name]    08/12/17 1655  Clinical Encounter Type  Visited With Patient and family together  Visit Type Initial  Referral From Nurse  Consult/Referral To Chaplain  Spiritual Encounters  Spiritual Needs Prayer;Emotional  and prayed with her.

## 2017-08-12 NOTE — Discharge Instructions (Signed)
[facility] Failure [facility] appointment on August 20 2017 at 11:00am with Darylene Price, [place]. Please call [phone] to reschedule.

## 2017-08-12 NOTE — Progress Notes (Signed)
[place] at [place] NAME: Morgan [name]    MR#:  [id number]  DATE OF BIRTH:  [birth year]  SUBJECTIVE:   Came in with increasing shortness of breath weakness and weight loss REVIEW OF SYSTEMS:   Review of Systems  Constitutional: Positive for weight loss. Negative for chills and fever.  HENT: Negative for ear discharge, ear pain and nosebleeds.   Eyes: Negative for blurred vision, pain and discharge.  Respiratory: Positive for shortness of breath. Negative for sputum production, wheezing and stridor.   Cardiovascular: Positive for orthopnea. Negative for chest pain, palpitations and PND.  Gastrointestinal: Negative for abdominal pain, diarrhea, nausea and vomiting.  Genitourinary: Negative for frequency and urgency.  Musculoskeletal: Negative for back pain and joint pain.  Neurological: Positive for weakness. Negative for sensory change, speech change and focal weakness.  Psychiatric/Behavioral: Negative for depression and hallucinations. The patient is not nervous/anxious.    Tolerating Diet:yes Tolerating PT: pending  DRUG ALLERGIES:   Allergies  Allergen Reactions  . Aspirin Other (See Comments)    bleeding  . Atenolol Other (See Comments)    fatigue  . Clopidogrel Bisulfate Other (See Comments)    bleeding  . Darvon [Propoxyphene] Hives and Other (See Comments)  . Hydrochlorothiazide Other (See Comments)    Fatigue and cramps  . Iodinated Diagnostic Agents Other (See Comments)    Questionable GI bleed  . Nylon Hives  . Other Other (See Comments)    Nylon sutures  . Pravastatin Other (See Comments)    Severe cramping    VITALS:  Blood pressure 123/65, pulse 79, temperature 98.1 F (36.7 C), temperature source Oral, resp. rate (!) 25, height 5\' 6"  (1.676 m), weight 71.8 kg (158 lb 4.6 oz), SpO2 91 %.  PHYSICAL EXAMINATION:   Physical Exam  GENERAL:  82 y.o.-year-old patient lying in the bed with no acute  distress. Chronically ill EYES: Pupils equal, round, reactive to light and accommodation. No scleral icterus. Extraocular muscles intact.  HEENT: Head atraumatic, normocephalic. Oropharynx and nasopharynx clear.  NECK:  Supple, no jugular venous distention. No thyroid enlargement, no tenderness.  LUNGS: decreased breath sounds bilaterally, no wheezing, rales, rhonchi. No use of accessory muscles of respiration.  CARDIOVASCULAR: S1, S2 normal. No murmurs, rubs, or gallops.  ABDOMEN: Soft, nontender, nondistended. Bowel sounds present. No organomegaly or mass.  EXTREMITIES: No cyanosis, clubbing or edema b/l.   Chronic leg edema with significant wrinkling of the skin NEUROLOGIC: Cranial nerves II through XII are intact. No focal Motor or sensory deficits b/l.   PSYCHIATRIC:  patient is alert and oriented x 3.  SKIN: No obvious rash, lesion, or ulcer.   LABORATORY PANEL:  CBC  Recent Labs Lab 08/12/17 0540  WBC 4.4  HGB 10.9*  HCT 29.9*  PLT 120*    Chemistries   Recent Labs Lab 08/11/17 1046 08/12/17 0540  NA 138 136  K 4.0 4.1  CL 95* 97*  CO2 29 26  GLUCOSE 215* 175*  BUN 71* 77*  CREATININE 3.66* 3.50*  CALCIUM 8.3* 8.0*  AST 41  --   ALT 27  --   ALKPHOS 145*  --   BILITOT 2.7*  --    Cardiac Enzymes  Recent Labs Lab 08/11/17 1046  TROPONINI 0.12*   RADIOLOGY:  Dg Chest 1 View  Result Date: 08/11/2017 CLINICAL DATA:  Status post right thoracentesis EXAM: CHEST 1 VIEW COMPARISON:  08/11/2017 FINDINGS: Cardiac shadow is again enlarged. Aortic calcifications and  mitral valve calcifications are noted. Right chest wall port is again seen and stable. There is been significant reduction in right-sided pleural effusion. A small amount of fluid remains. No pneumothorax is seen. There remains a pleural-based rounded density in the right apex which is stable from the recent exam. This remains suspicious for neoplasm an when the patient's condition improves a CT of the  chest is recommended. IMPRESSION: No pneumothorax following thoracentesis on the right. A small residual effusion remains. Rounded masslike density in the right apex suspicious for underlying neoplasm given the effusion. CT of the chest with contrast when the patient's condition improves is recommended. Electronically Signed   By: Inez Catalina M.D.   On: 08/11/2017 16:50   Dg Chest Port 1 View  Result Date: 08/11/2017 CLINICAL DATA:  Shortness of breath. EXAM: PORTABLE CHEST 1 VIEW COMPARISON:  09/02/2016 report. Decubitus chest x-ray 05/31/2013. AP chest 07/10/2012 . FINDINGS: Port-A-Cath noted with tip over the cavoatrial junction. Cardiomegaly with pulmonary vascular prominence and bilateral interstitial prominence suggesting CHF. Prominent right pleural effusion. Prominent rounded density noted over the right upper lung. This could represent loculated pleural fluid, pulmonary infiltrate, or pulmonary mass . Follow-up exams demonstrate clearing suggested . IMPRESSION: 1. Port-A-Cath noted with tip over the cavoatrial junction. 2. Cardiomegaly with pulmonary vascular prominence and bilateral interstitial prominence. Prominent right pleural effusion. Findings suggest CHF. 3. Prominent rounded density noted of the right upper lung. This could represent loculated pleural fluid, pulmonary infiltrate, pulmonary mass. Follow-up exam suggested to demonstrate clearing. Electronically Signed   By: Marcello Moores  Register   On: 08/11/2017 11:22   US Thoracentesis Asp Pleural Space W/img Guide  Result Date: 08/11/2017 INDICATION: Right pleural effusion EXAM: ULTRASOUND GUIDED RIGHT THORACENTESIS MEDICATIONS: None. COMPLICATIONS: None immediate. PROCEDURE: An ultrasound guided thoracentesis was thoroughly discussed with the patient and questions answered. The benefits, risks, alternatives and complications were also discussed. The patient understands and wishes to proceed with the procedure. Written consent was obtained.  Ultrasound was performed to localize and mark an adequate pocket of fluid in the right chest. The area was then prepped and draped in the normal sterile fashion. 1% Lidocaine was used for local anesthesia. Under ultrasound guidance a 6 Fr Safe-T-Centesis catheter was introduced. Thoracentesis was performed. The catheter was removed and a dressing applied. FINDINGS: A total of approximately 1.1 L of dark amber fluid was removed. Samples were sent to the laboratory as requested by the clinical team. IMPRESSION: Successful ultrasound guided right thoracentesis yielding 1.1 L of pleural fluid. Electronically Signed   By: Inez Catalina M.D.   On: 08/11/2017 16:46   ASSESSMENT AND PLAN:  Morgan [name]  is a 82 y.o. female with a known history of EBV, chronic systolic CHF, CKD stage IV, anemia of chronic disease, GI bleed, diabetes, hypertension presents to the emergency room complaining of progressively worsening shortness of breath over a week. She has noticed increased lower extremity swelling, orthopnea  * Acute hypoxic respiratory failure due to acute on chronic congestive heart failure with moderate right pleural effusion Patient now off BiPAP.  -Received f IV Lasix. ---Holding it now given elevated creatinine and patient overall looks more dry. -Discussed with pulmonary  -Status post ultrasound-guided right thoracentesis.--- Removal of more than a liter of fluid -Preliminary result for pleural fluid positive for malignant cells -Chest x-ray shows right upper lobe mass versus loculated effusion. Given history of smoking many years, weight loss of 90 pounds over 1 year--- highly suspicious to be malignancy -We'll get  CT of the chest without contrast -Oncology consultation  *Right upper lobe lung mass with right-sided pleural effusion -Suspicious to be malignant effusion with negative results positive for carcinoma -CT chest pending -Oncology consultation placed -we'll hold off further diuresis  *  Acute COPD exacerbation Start IV Solu-Medrol and nebulizers.  * Diabetes mellitus. Start sliding scale insulin.  * Acute kidney injury over CKD stage IV  -Patient received high dose of Lasix yesterday. We'll hold off Lasix. Creatinine creeping up.  * DVT prophylaxis with subcutaneous heparin  *Palliative care consultation  Case discussed with Care Management/Social Worker. Management plans discussed with the patient, family and they are in agreement.  CODE STATUS: DO NOT RESUSCITATE  DVT Prophylaxis: Heparin  TOTAL TIME TAKING CARE OF THIS PATIENT: 30  minutes.  >50% time spent on counselling and coordination of carePatient and daughter  POSSIBLE D/C IN *1-2* DAYS, DEPENDING ON CLINICAL CONDITION.  Note: This dictation was prepared with Dragon dictation along with smaller phrase technology. Any transcriptional errors that result from this process are unintentional.  Mackinze Criado M.D on 08/12/2017 at 2:38 PM  Between 7am to 6pm - Pager - [phone]  After 6pm go to [email or web address] - password [email or web address] [facility]  Office  [phone]  CC: Primary care physician; Kirk Ruths, MD

## 2017-08-12 NOTE — Progress Notes (Signed)
Inpatient Diabetes Program Recommendations  AACE/ADA: New Consensus Statement on Inpatient Glycemic Control (2015)  Target Ranges:  Prepandial:   less than 140 mg/dL      Peak postprandial:   less than 180 mg/dL (1-2 hours)      Critically ill patients:  140 - 180 mg/dL   Lab Results  Component Value Date   GLUCAP 151 (H) 08/12/2017   HGBA1C 6.6 (H) 08/11/2017    Review of Glycemic Control  Results for SAVAYA, HAKES (MRN [id number]) as of 08/12/2017 09:25  Ref. Range 08/11/2017 13:18 08/11/2017 16:37 08/11/2017 21:33 08/12/2017 07:33  Glucose-Capillary Latest Ref Range: 65 - 99 mg/dL 212 (H) 198 (H) 263 (H) 151 (H)    Diabetes history: Type 2 Outpatient Diabetes medications: none Current orders for Inpatient glycemic control: Novolog 0-9 units tid, Novolog 0-5 units qhs  Inpatient Diabetes Program Recommendations: Elevated CBG likely as a result of steroids given.  Since she does not have any additional insulin ordered and because of poor renal function- recommend we continue with current insulin orders.  Gentry Fitz, RN, BA, MHA, CDE Diabetes Coordinator Inpatient Diabetes Program  [phone] (Team Pager) [phone] ([place]) 08/12/2017 9:30 AM

## 2017-08-12 NOTE — Progress Notes (Signed)
Report given to [name], RN on 1C. Patient transferred without telemetry. CCMD and Elink notified. Family updated on transfer. Morgan [name]

## 2017-08-12 NOTE — Progress Notes (Signed)
No new complaints. Appears more dyspneic today. Denies pain  Vitals:   08/12/17 1043 08/12/17 1100 08/12/17 1159 08/12/17 1300  BP: (!) 123/48 123/65    Pulse: 80 79 79   Resp:  (!) 32 (!) 25   Temp:   98.1 F (36.7 C)   TempSrc:   Oral   SpO2:  95% 91%   Weight:      Height:    5\' 6"  (1.676 m)    Mildly tachypneic HEENT WNL No JVD Diffuse scattered wheezes Regular, no M NABS, NT 2+ symmetric LE edema No focal neurologic deficits  BMP Latest Ref Rng & Units 08/12/2017 08/11/2017 07/25/2017  Glucose 65 - 99 mg/dL 175(H) 215(H) 182(H)  BUN 6 - 20 mg/dL 77(H) 71(H) 39(H)  Creatinine 0.44 - 1.00 mg/dL 3.50(H) 3.66(H) 2.13(H)  Sodium 135 - 145 mmol/L 136 138 141  Potassium 3.5 - 5.1 mmol/L 4.1 4.0 4.0  Chloride 101 - 111 mmol/L 97(L) 95(L) 98(L)  CO2 22 - 32 mmol/L 26 29 32  Calcium 8.9 - 10.3 mg/dL 8.0(L) 8.3(L) 8.4(L)    CBC Latest Ref Rng & Units 08/12/2017 08/11/2017 07/25/2017  WBC 3.6 - 11.0 K/uL 4.4 8.6 5.0  Hemoglobin 12.0 - 16.0 g/dL 10.9(L) 12.0 10.8(L)  Hematocrit 35.0 - 47.0 % 29.9(L) 34.8(L) 31.2(L)  Platelets 150 - 440 K/uL 120(L) 166 108(L)    CXR: NNF  Pleural fluid cytology (Verbal report): Malignant cells present  IMPRESSION: Acute hypoxemic respiratory failure Malignant right pleural effusion Right upper lobe opacity - likely bronchogenic carcinoma Dilated CM (LVEF 30-35% by echocardiogram 12/02/16) Moderate aortic stenosis by echocardiogram 12/02/16 Acute bronchospasm AKI/CKD New finding of right upper lobe mass with malignant pleural effusion  DISCUSSION: The patient has expressed to me limited goals of care. She is DNR. We discussed kidney failure and she was certain that she would not wish to undergo dialysis. I have not discussed the finding of malignancy on her pleural fluid cytology with her. I received report of this after seeing the patient this morning. I have discussed with Dr. Posey Pronto. It is my opinion that we should be relatively limited in  further evaluation of this as she will not be a candidate for any major therapies regarding her malignancy due to her multiple comorbidities and very poor performance status.  IMPRESSION: Continue supplemental oxygen Nebulized bronchodilators ordered 08/21 Recheck CXR in a.m. 08/22 It is likely that her right pleural effusion will recur. We'll need to discuss management options - do nothing versus Pleurx catheter Dr. Posey Pronto will notify oncology of the new finding of malignancy I recommend palliative care consultation Transfer to MedSurg After transfer, PCCM will sign off. Please call if we can be of further assistance   Merton Border, MD PCCM service Mobile [phone] Pager [phone] 08/12/2017 2:15 PM

## 2017-08-13 ENCOUNTER — Other Ambulatory Visit: Payer: Self-pay | Admitting: Pathology

## 2017-08-13 ENCOUNTER — Inpatient Hospital Stay: Payer: Medicare Other

## 2017-08-13 DIAGNOSIS — N183 Chronic kidney disease, stage 3 (moderate): Secondary | ICD-10-CM

## 2017-08-13 DIAGNOSIS — D696 Thrombocytopenia, unspecified: Secondary | ICD-10-CM

## 2017-08-13 DIAGNOSIS — R918 Other nonspecific abnormal finding of lung field: Secondary | ICD-10-CM

## 2017-08-13 DIAGNOSIS — Z7189 Other specified counseling: Secondary | ICD-10-CM

## 2017-08-13 DIAGNOSIS — I130 Hypertensive heart and chronic kidney disease with heart failure and stage 1 through stage 4 chronic kidney disease, or unspecified chronic kidney disease: Secondary | ICD-10-CM

## 2017-08-13 DIAGNOSIS — D631 Anemia in chronic kidney disease: Secondary | ICD-10-CM

## 2017-08-13 DIAGNOSIS — R0602 Shortness of breath: Secondary | ICD-10-CM

## 2017-08-13 DIAGNOSIS — Z87891 Personal history of nicotine dependence: Secondary | ICD-10-CM

## 2017-08-13 LAB — BASIC METABOLIC PANEL
ANION GAP: 13 (ref 5–15)
BUN: 87 mg/dL — ABNORMAL HIGH (ref 6–20)
CALCIUM: 7.5 mg/dL — AB (ref 8.9–10.3)
CO2: 28 mmol/L (ref 22–32)
Chloride: 98 mmol/L — ABNORMAL LOW (ref 101–111)
Creatinine, Ser: 3.35 mg/dL — ABNORMAL HIGH (ref 0.44–1.00)
GFR, EST AFRICAN AMERICAN: 14 mL/min — AB (ref >60)
GFR, EST NON AFRICAN AMERICAN: 12 mL/min — AB (ref >60)
Glucose, Bld: 109 mg/dL — ABNORMAL HIGH (ref 65–99)
Potassium: 4 mmol/L (ref 3.5–5.1)
Sodium: 139 mmol/L (ref 135–145)

## 2017-08-13 LAB — GLUCOSE, CAPILLARY: Glucose-Capillary: 105 mg/dL — ABNORMAL HIGH (ref 65–99)

## 2017-08-13 MED ORDER — IPRATROPIUM-ALBUTEROL 0.5-2.5 (3) MG/3ML IN SOLN: 3.0000 mL | RESPIRATORY_TRACT | Status: DC | PRN

## 2017-08-13 MED ORDER — HALOPERIDOL 0.5 MG PO TABS
0.5000 mg | ORAL_TABLET | ORAL | Status: DC | PRN
  Filled 2017-08-13: qty 1

## 2017-08-13 MED ORDER — POLYVINYL ALCOHOL 1.4 % OP SOLN
1.0000 [drp] | Freq: Four times a day (QID) | OPHTHALMIC | Status: DC | PRN
  Filled 2017-08-13: qty 15

## 2017-08-13 MED ORDER — LORAZEPAM 2 MG/ML PO CONC: 0.2500 mg | Freq: Every day | ORAL | Status: DC

## 2017-08-13 MED ORDER — GLYCOPYRROLATE 0.2 MG/ML IJ SOLN
0.2000 mg | INTRAMUSCULAR | Status: DC | PRN
  Filled 2017-08-13: qty 1

## 2017-08-13 MED ORDER — LORAZEPAM 2 MG/ML PO CONC: 0.2500 mg | Freq: Four times a day (QID) | ORAL | Status: DC | PRN

## 2017-08-13 MED ORDER — BIOTENE DRY MOUTH MT LIQD: 15.0000 mL | OROMUCOSAL | Status: DC | PRN

## 2017-08-13 MED ORDER — MORPHINE SULFATE (CONCENTRATE) 10 MG/0.5ML PO SOLN
2.5000 mg | ORAL | Status: DC | PRN
  Administered 2017-08-13 (×2): 2.6 mg via ORAL
  Administered 2017-08-14: 5 mg via ORAL
  Filled 2017-08-13 (×3): qty 1

## 2017-08-13 MED ORDER — MORPHINE SULFATE (PF) 2 MG/ML IV SOLN: 1.0000 mg | INTRAVENOUS | Status: DC | PRN

## 2017-08-13 MED ORDER — HALOPERIDOL LACTATE 5 MG/ML IJ SOLN: 0.5000 mg | INTRAMUSCULAR | Status: DC | PRN

## 2017-08-13 MED ORDER — BUDESONIDE 0.25 MG/2ML IN SUSP: 0.2500 mg | Freq: Four times a day (QID) | RESPIRATORY_TRACT | Status: DC | PRN

## 2017-08-13 MED ORDER — HALOPERIDOL LACTATE 2 MG/ML PO CONC
0.5000 mg | ORAL | Status: DC | PRN
Start: 2017-08-13 — End: 2017-08-14
  Filled 2017-08-13: qty 0.3

## 2017-08-13 MED ORDER — GLYCOPYRROLATE 1 MG PO TABS
1.0000 mg | ORAL_TABLET | ORAL | Status: DC | PRN
  Filled 2017-08-13: qty 1

## 2017-08-13 NOTE — Evaluation (Signed)
Physical Therapy Evaluation Patient Details Name: Morgan [name] MRN: [id number] DOB: 1935/10/17 Today's Date: 08/13/2017   History of Present Illness  Pt is a 82 y.o. F who presented to ED with increased SOB, generalized chest pain and O2 desaturation to 82% on room air. Pt admitted 08/11/2017 with respiratory distress, acute on chronic CHF, pulmonary edema and R sided pleural effusion. Recent imaging shows R upper lung mass. PMH: R chest port cath, CAD, CHF, aortic stenosis, cardiomyopathy, cervial cancer, COPD, DM, CKD, GERD, GI bleed, HTN, HLD, PVD, trigeminal neuralgia, hypotension, cardiac cath.    Clinical Impression  Prior to admission pt reports household ambulation with 4WRW to bathroom and back with significant SOB; pt reports use of BSC for night time toileting needs. Pt reports family and caregiver able to assist 24/7 upon discharge. Pt A and O x4 throughout session. Pt denies pain throughout session. Pt currently requires +1 min assist with HOB elevated for bed mobility; pt demonstrates significant SOB upon sitting on EOB; pt's O2 desaturated to 83% on 3L [place] upon sitting on EOB; vc's given for deep breathing; pt returned to supine with +1 mod assist for B LE; pt's oxygen returned to baseline (92%) on 3L [place] after a couple minutes of deep breathing in supine with HOB elevated. Pt will continue to benefit from skilled PT to address strength, balance and functional mobility deficits. Recommended discharge to STR.    Follow Up Recommendations SNF    Equipment Recommendations  None recommended by PT    Recommendations for Other Services       Precautions / Restrictions Precautions Precautions: Fall;Other (comment) Precaution Comments: R chest port cath Restrictions Weight Bearing Restrictions: No      Mobility  Bed Mobility Overal bed mobility: Needs Assistance Bed Mobility: Supine to Sit;Sit to Supine     Supine to sit: Min assist;HOB elevated Sit to supine: Mod assist;HOB  elevated   General bed mobility comments: pt requires min assist with HOB elevated for supine to sit with significant SOB noted; O2 desat to 83% on 3L [place]; pt returned to supine with HOB elevated and mod assist for B LE; after a couple minutes pt's O2 back to baseline (92%) on 3L [place]  Transfers                 General transfer comment: deferred due to safety concerns due to O2 desat  Ambulation/Gait             General Gait Details: deferred due to safety concerns due to O2 desat  Stairs            Wheelchair Mobility    Modified Rankin (Stroke Patients Only)       Balance Overall balance assessment: Needs assistance Sitting-balance support: Bilateral upper extremity supported;Feet supported Sitting balance-Leahy Scale: Poor Sitting balance - Comments: pt able to maintain static sitting balance with B UE and B LE support with no overt loss of balance noted       Standing balance comment: deferred due to safety concerns due to O2 desat                             Pertinent Vitals/Pain Pain Assessment: No/denies pain  HR - 72 to 86 bpm throughout session via pulse ox O2 - 83 to 92% on 3L  throughout session via pulse ox    Home Living Family/patient expects to be discharged to:: Private residence Living Arrangements:  Children Available Help at Discharge: Family;Personal care attendant;Available 24 hours/day Type of Home: House Home Access: Ramped entrance     Home Layout: Multi-level;Able to live on main level with bedroom/bathroom Home Equipment: Gilford Rile - 2 wheels;Walker - 4 wheels;Cane - single point;Bedside commode;Shower seat;Grab bars - tub/shower;Hospital bed      Prior Function Level of Independence: Needs assistance   Gait / Transfers Assistance Needed: independent for household ambulation with rollator with significant SOB reported  ADL's / Homemaking Assistance Needed: requires assistance from personal care attendant for  bathing and dressing        Hand Dominance   Dominant Hand: Right    Extremity/Trunk Assessment   Upper Extremity Assessment Upper Extremity Assessment: Generalized weakness    Lower Extremity Assessment Lower Extremity Assessment: Generalized weakness       Communication   Communication: No difficulties  Cognition Arousal/Alertness: Awake/alert Behavior During Therapy: WFL for tasks assessed/performed Overall Cognitive Status: Within Functional Limits for tasks assessed                                        General Comments      Exercises Total Joint Exercises Ankle Circles/Pumps: AROM;Both;10 reps;Supine Quad Sets: AROM;Strengthening;Both;10 reps;Supine Gluteal Sets: AROM;Strengthening;Both;10 reps;Supine Short Arc Quad: AROM;Strengthening;Both;10 reps;Supine Heel Slides: AROM;Strengthening;Both;10 reps;Supine   Assessment/Plan    PT Assessment Patient needs continued PT services  PT Problem List Decreased strength;Decreased activity tolerance;Decreased balance;Decreased mobility;Decreased knowledge of use of DME;Cardiopulmonary status limiting activity       PT Treatment Interventions DME instruction;Gait training;Functional mobility training;Therapeutic activities;Therapeutic exercise;Balance training;Patient/family education    PT Goals (Current goals can be found in the Care Plan section)  Acute Rehab PT Goals Patient Stated Goal: to return home PT Goal Formulation: With patient Time For Goal Achievement: 08/27/17 Potential to Achieve Goals: Fair    Frequency Min 2X/week   Barriers to discharge        Co-evaluation               AM-PAC PT "6 Clicks" Daily Activity  Outcome Measure Difficulty turning over in bed (including adjusting bedclothes, sheets and blankets)?: A Lot Difficulty moving from lying on back to sitting on the side of the bed? : Unable Difficulty sitting down on and standing up from a chair with arms  (e.g., wheelchair, bedside commode, etc,.)?: Unable Help needed moving to and from a bed to chair (including a wheelchair)?: A Little Help needed walking in hospital room?: A Lot Help needed climbing 3-5 steps with a railing? : Total 6 Click Score: 10    End of Session Equipment Utilized During Treatment: Oxygen Activity Tolerance: Treatment limited secondary to medical complications (Comment) (significant O2 desat and SOB) Patient left: in bed;with call bell/phone within reach;with bed alarm set;with family/visitor present Nurse Communication: Mobility status;Precautions;Other (comment) (O2 desat) PT Visit Diagnosis: Difficulty in walking, not elsewhere classified (R26.2);Muscle weakness (generalized) (M62.81)    Time: 8588-5027 PT Time Calculation (min) (ACUTE ONLY): 38 min   Charges:         PT G CodesLaqueta Carina, SPT Sep 08, 2017,10:50 AM [phone]

## 2017-08-13 NOTE — Progress Notes (Signed)
No charge note.  Patient's sister called.  Patient does not want a Pleurx cath.  She wants full comfort care (now refusing CBG sticks).  Family requests Hospice house.  She is not eating.  She is not ambulatory.  She is short of breath.    I will change her orders to full comfort and request Hospice House thru social work.  Florentina Jenny, PA-C Palliative Medicine Pager: [phone]

## 2017-08-13 NOTE — Plan of Care (Signed)
Problem: Role Relationship: Goal: Familys ability to cope with current situation will improve Outcome: Progressing Family support provided. Chaplain in to visit with family today.   Problem: Pain Management: Goal: General experience of comfort will improve Outcome: Progressing Po morphine started. Patient encouraged to verbalized her needs for comfort and to stay on top of her pain  Comments: Patient transitioned to comfort care today. Support provided for family and patient. Interventions provided at patient's request.

## 2017-08-13 NOTE — Progress Notes (Addendum)
Daily Progress Note   Patient Name: Morgan [name]       Date: 08/13/2017 DOB: 1935/05/24  Age: 82 y.o. MRN#: [id number] Attending Physician: Fritzi Mandes, MD Primary Care Physician: Kirk Ruths, MD Admit Date: 08/11/2017  Reason for Consultation/Follow-up: Establishing goals of care   Addendum:  Family requesting consideration of Hospice Home. They currently don't have enough support to properly care for her at home.  Family and patient trying to decide between [facility] vs other options.   Subjective: I appreciated a call I received from Dr. Yevette Edwards this am.  I've also spoken with Dr. Posey Pronto and the patient's son Antony Haste. Mrs. Doretha Sou and I discussed her diagnosis.  She wants to be comfortable and live as long as she can as well as she can.  She understands Hospice will help her do that.   She wants to get up and move as much as possible.  PT is about to see her.  We discussed using PRN morphine for dyspnea and pain as well as using PRN ativan (low dose) for anxiety.  We also discussed the possibility of a Pleurx Cath to help drain the right pleural space and ease her breathing without returning to the hospital.    On a lighter note we laughed together about her stomach rumblings and she told me about her beautiful newly remodeled kitchen.  She wants to be discharged ASAP so that she can go to the beauty salon.   Assessment: 82 yo female with multiple comorbidities (CVA, Cirrhosis, CKD, CHF, AS) now with large tumor and adenopathy in her lung.  Conditions are not amenable to chemo or radiation.  Hospice services are recommended.   Patient Profile/HPI:  82 y.o. female  with past medical history of CHF, CKD stg IV, GIB (AVMs), aortic stenosis, and DM who was admitted on 08/11/2017  with hypoxic respiratory failure.  Work up revealed COPD exacerbation and pleural effusion.  Thoracentesis removed 1.1 liters of fluid.  Post thoracentesis CXR show a possible right upper lobe mass.  CT scan revealed a 4x4 cm mass compressing the SVC with extensive associated hilar adenopathy and possible post obstructive pna.   Length of Stay: 2  Current Medications: Scheduled Meds:  . amiodarone  200 mg Oral Daily  . budesonide (PULMICORT) nebulizer solution  0.25 mg Nebulization  Q6H  . carvedilol  3.125 mg Oral BID  . heparin  5,000 Units Subcutaneous Q8H  . insulin aspart  0-5 Units Subcutaneous QHS  . insulin aspart  0-9 Units Subcutaneous TID WC  . ipratropium-albuterol  3 mL Nebulization Q6H  . mouth rinse  15 mL Mouth Rinse BID  . pantoprazole  40 mg Oral BID  . sodium chloride flush  3 mL Intravenous Q12H  . traZODone  50 mg Oral QHS    Continuous Infusions:   PRN Meds: acetaminophen **OR** acetaminophen, albuterol, LORazepam, morphine CONCENTRATE, ondansetron **OR** ondansetron (ZOFRAN) IV, polyethylene glycol  Physical Exam        Frail elderly female, very pleasant, poor dentition Increased work of breathing when speaking 1+ edema in her lower extremities bilaterally.  Vital Signs: BP (!) 105/53 (BP Location: Right Arm)   Pulse 74   Temp 98.6 F (37 C)   Resp 20   Ht 5\' 6"  (1.676 m)   Wt 70.3 kg (155 lb)   SpO2 96%   BMI 25.02 kg/m  SpO2: SpO2: 96 % O2 Device: O2 Device: Nasal Cannula O2 Flow Rate: O2 Flow Rate (L/min): 3 L/min  Intake/output summary: No intake or output data in the 24 hours ending 08/13/17 0939 LBM: Last BM Date: 08/12/17 Baseline Weight: Weight: 73.9 kg (163 lb) Most recent weight: Weight: 70.3 kg (155 lb)       Palliative Assessment/Data:    Flowsheet Rows     Most Recent Value  Intake Tab  Referral Department  Hospitalist  Unit at Time of Referral  ICU  Palliative Care Primary Diagnosis  Other (Comment)  Date Notified   08/12/17  Palliative Care Type  New Palliative care  Reason for referral  Clarify Goals of Care  Date of Admission  08/11/17  Date first seen by Palliative Care  08/12/17  # of days Palliative referral response time  0 Day(s)  # of days IP prior to Palliative referral  1  Clinical Assessment  Palliative Performance Scale Score  50%  Dyspnea Max Last 24 Hours  7  Dyspnea Min Last 24 hours  4  Psychosocial & Spiritual Assessment  Palliative Care Outcomes  Patient/Family meeting held?  Yes  Who was at the meeting?  patient and her sister  Palliative Care Outcomes  Clarified goals of care      Patient Active Problem List   Diagnosis Date Noted  . Mass of upper lobe of right lung   . Pleural effusion on right   . CHF (congestive heart failure) ([place]) 08/11/2017  . Heme positive stool   . DNR (do not resuscitate) discussion   . Acute GI bleeding 03/24/2017  . Hypotension 12/22/2016  . Hyperkalemia 12/22/2016  . Palliative care encounter   . Goals of care, counseling/discussion   . Pressure injury of skin 12/03/2016  . ST elevation myocardial infarction involving right coronary artery ([place]) 12/02/2016  . ST elevation myocardial infarction (STEMI) of inferior wall ([place]) 12/02/2016  . Anemia due to stage 3 chronic kidney disease 09/26/2016  . Iron deficiency anemia   . Acute posthemorrhagic anemia   . Blood in stool   . Benign neoplasm of colon   . Angiodysplasia of intestine with hemorrhage   . GIB (gastrointestinal bleeding) 08/22/2016  . AKI (acute kidney injury) ([place]) 08/22/2016  . Rectal bleeding   . Pressure ulcer 07/10/2016  . Thrombocytopenia ([place]) 07/09/2016  . Anemia of chronic kidney failure 06/24/2016  . Iron deficiency anemia due to  chronic blood loss 04/29/2016  . Nonrheumatic mitral valve insufficiency 11/23/2015  . Non-rheumatic mitral valve stenosis 11/23/2015  . Angiodysplasia 05/19/2015  . Cervical cancer ([place]) 05/19/2015  . Fatty liver 05/19/2015  .  Macrocytosis 05/19/2015  . PUD (peptic ulcer disease) 05/19/2015  . Pure hypercholesterolemia 03/06/2015  . Chronic edema 12/27/2014  . Diabetes ([place]) 10/25/2014  . Anemia, unspecified 06/18/2014  . Nonrheumatic aortic valve stenosis 06/18/2014  . Ataxia 06/18/2014  . Coronary artery disease involving native coronary artery of native heart without angina pectoris 06/18/2014  . Cardiomyopathy ([place]) 06/18/2014  . Chronic kidney disease (CKD), stage III (moderate) 06/18/2014  . COPD (chronic obstructive pulmonary disease) ([place]) 06/18/2014  . Type 2 diabetes mellitus with stage 4 chronic kidney disease, without long-term current use of insulin ([place]) 06/18/2014  . Gout 06/18/2014  . Benign hypertension 06/18/2014  . Trigeminal neuralgia 06/18/2014  . DM (diabetes mellitus) type II controlled, neurological manifestation ([place]) 06/18/2014  . Essential hypertension 06/18/2014    Palliative Care Plan    Recommendations/Plan:  Morphine concentrate SL PRN shortness of breath or pain\  Ativan SL PRN anxiety or insomnia  Home with Hospice Services ASAP.  She will need oxygen   Consider pleurx cath placement to right pleural space for comfort.  Goals of Care and Additional Recommendations:  Limitations on Scope of Treatment: Full Comfort Care  Code Status:  DNR  Prognosis:   Unable to determine if she does not have a pleurx Cath placed - likely days to weeks.  If she does have a pleurx cath placed possibly months.   Discharge Planning:  Home with Hospice  Care plan was discussed with Dr. Rogue Bussing, Dr. Posey Pronto, Patient and son.  Thank you for allowing the Palliative Medicine Team to assist in the care of this patient.  Total time spent:  60 min.     Greater than 50%  of this time was spent counseling and coordinating care related to the above assessment and plan.  Florentina Jenny, PA-C Palliative Medicine  Please contact Palliative MedicineTeam phone at [phone] for  questions and concerns between 7 am - 7 pm.   Please see AMION for individual provider pager numbers.

## 2017-08-13 NOTE — Care Management Note (Signed)
Case Management Note  Patient Details  Name: Morgan [name] MRN: [id number] Date of Birth: 12-Jan-1935  Subjective/Objective:      Admitted to [facility] with the diagnosis of CHF. Lives with son, Antony Haste [phone]). Last seen Dr. Ouida Sills a month ago. Prescriptions [facility] [place] in the past. [place] in the past. No home oxygen. Personal Care services per [facility] Monday-Friday 9:30am-4:00pm. Rolling walker, hospital bed, and bedside commode in the home. Self feeds, and nursing assistant helps with dressing and baths, Last fall was a year ago. Decreased appetite. Lost 20 pounds (fluid).               Action/Plan: Physical therapy evaluation completed. Recommending skilled nursing. Received referral for Hospice services in the home. Spoke with son, Antony Haste. Unsure of discharge disposition at this time.   Expected Discharge Date:                  Expected Discharge Plan:     In-House Referral:     Discharge planning Services     Post Acute Care Choice:    Choice offered to:     DME Arranged:    DME Agency:     HH Arranged:    HH Agency:     Status of Service:     If discussed at [employer], dates discussed:    Additional Comments:  Shelbie Ammons, [place] Management [phone] 08/13/2017, 10:40 AM

## 2017-08-13 NOTE — Progress Notes (Signed)
[place] [facility] [place] NAME: Mercadies Co    MR#:  [id number]  DATE OF BIRTH:  Jan 29, 1935  SUBJECTIVE:   Came in with increasing shortness of breath weakness and weight loss REVIEW OF SYSTEMS:   Review of Systems  Constitutional: Positive for weight loss. Negative for chills and fever.  HENT: Negative for ear discharge, ear pain and nosebleeds.   Eyes: Negative for blurred vision, pain and discharge.  Respiratory: Positive for shortness of breath. Negative for sputum production, wheezing and stridor.   Cardiovascular: Positive for orthopnea. Negative for chest pain, palpitations and PND.  Gastrointestinal: Negative for abdominal pain, diarrhea, nausea and vomiting.  Genitourinary: Negative for frequency and urgency.  Musculoskeletal: Negative for back pain and joint pain.  Neurological: Positive for weakness. Negative for sensory change, speech change and focal weakness.  Psychiatric/Behavioral: Negative for depression and hallucinations. The patient is not nervous/anxious.    Tolerating Diet:yes Tolerating PT: pending  DRUG ALLERGIES:   Allergies  Allergen Reactions  . Aspirin Other (See Comments)    bleeding  . Atenolol Other (See Comments)    fatigue  . Clopidogrel Bisulfate Other (See Comments)    bleeding  . Darvon [Propoxyphene] Hives and Other (See Comments)  . Hydrochlorothiazide Other (See Comments)    Fatigue and cramps  . Iodinated Diagnostic Agents Other (See Comments)    Questionable GI bleed  . Nylon Hives  . Other Other (See Comments)    Nylon sutures  . Pravastatin Other (See Comments)    Severe cramping    VITALS:  Blood pressure (!) 118/57, pulse 84, temperature 98.1 F (36.7 C), temperature source Oral, resp. rate (!) 26, height 5\' 6"  (1.676 m), weight 70.3 kg (155 lb), SpO2 94 %.  PHYSICAL EXAMINATION:   Physical Exam  GENERAL:  82 y.o.-year-old patient lying in the bed with no acute distress.  Chronically ill EYES: Pupils equal, round, reactive to light and accommodation. No scleral icterus. Extraocular muscles intact.  HEENT: Head atraumatic, normocephalic. Oropharynx and nasopharynx clear.  NECK:  Supple, no jugular venous distention. No thyroid enlargement, no tenderness.  LUNGS: decreased breath sounds bilaterally, no wheezing, rales, rhonchi. No use of accessory muscles of respiration.  CARDIOVASCULAR: S1, S2 normal. No murmurs, rubs, or gallops.  ABDOMEN: Soft, nontender, nondistended. Bowel sounds present. No organomegaly or mass.  EXTREMITIES: No cyanosis, clubbing or edema b/l.   Chronic leg edema with significant wrinkling of the skin NEUROLOGIC: Cranial nerves II through XII are intact. No focal Motor or sensory deficits b/l.   PSYCHIATRIC:  patient is alert and oriented x 3.  SKIN: No obvious rash, lesion, or ulcer.   LABORATORY PANEL:  CBC  Recent Labs Lab 08/12/17 0540  WBC 4.4  HGB 10.9*  HCT 29.9*  PLT 120*    Chemistries   Recent Labs Lab 08/11/17 1046  08/13/17 0413  NA 138  < > 139  K 4.0  < > 4.0  CL 95*  < > 98*  CO2 29  < > 28  GLUCOSE 215*  < > 109*  BUN 71*  < > 87*  CREATININE 3.66*  < > 3.35*  CALCIUM 8.3*  < > 7.5*  AST 41  --   --   ALT 27  --   --   ALKPHOS 145*  --   --   BILITOT 2.7*  --   --   < > = values in this interval not displayed. Cardiac Enzymes  Recent Labs Lab 08/11/17 1046  TROPONINI 0.12*   RADIOLOGY:  Dg Chest 1 View  Result Date: 08/11/2017 CLINICAL DATA:  Status post right thoracentesis EXAM: CHEST 1 VIEW COMPARISON:  08/11/2017 FINDINGS: Cardiac shadow is again enlarged. Aortic calcifications and mitral valve calcifications are noted. Right chest wall port is again seen and stable. There is been significant reduction in right-sided pleural effusion. A small amount of fluid remains. No pneumothorax is seen. There remains a pleural-based rounded density in the right apex which is stable from the recent exam.  This remains suspicious for neoplasm an when the patient's condition improves a CT of the chest is recommended. IMPRESSION: No pneumothorax following thoracentesis on the right. A small residual effusion remains. Rounded masslike density in the right apex suspicious for underlying neoplasm given the effusion. CT of the chest with contrast when the patient's condition improves is recommended. Electronically Signed   By: Inez Catalina M.D.   On: 08/11/2017 16:50   Ct Chest Wo Contrast  Result Date: 08/12/2017 CLINICAL DATA:  Progressively worsening shortness of breath and abnormal chest x-rays. EXAM: CT CHEST WITHOUT CONTRAST TECHNIQUE: Multidetector CT imaging of the chest was performed following the standard protocol without IV contrast. COMPARISON:  Chest x-ray 08/11/2017 FINDINGS: Cardiovascular: The heart is enlarged. There is tortuosity and dense calcification of the thoracic aorta and dense and extensive three-vessel coronary artery calcifications. Dense mitral valve annular calcifications. Right IJ Port-A-Cath in good position. Mediastinum/Nodes: Bulky and extensive mediastinal lymphadenopathy. Large paratracheal mass measuring 3.6 cm is compressing the SVC. This also compression of the right upper lobe bronchus. Contralateral prevascular lymphadenopathy is also noted with maximal short axis measurement of 13.5 mm on image number 46. Subcarinal adenopathy measures 24.5 mm on image number 62. Lungs/Pleura: 4 x 4 cm right upper lobe lung mass with extensive interstitial and airspace process in the right upper lobe. This is worrisome for lymphangitic spread of tumor and/or possible bronchial spread of tumor. It also could be surrounding pneumonia. There is a moderate size right pleural effusion with overlying right lower lobe atelectasis. This is certainly worrisome for a malignant effusion. Recommend correlation with recent thoracentesis No obvious metastatic pulmonary nodules in the left lung. Underlying  emphysematous changes. Upper Abdomen: Cirrhotic changes involving the liver and splenomegaly. No obvious hepatic mass. Advanced atherosclerotic calcifications involving the abdominal aorta and branch vessels. Musculoskeletal: No significant bony findings. IMPRESSION: 1. 4 x 4 cm right upper lobe lung mass consistent with neoplasm. There is bulky mediastinal lymphadenopathy and compression of the SVC and right upper lobe bronchus. 2. Extensive interstitial and airspace process in the right upper lobe could be interstitial/lymphatic spread of tumor or possibly obstructive pneumonia. 3. Moderate sized right pleural effusion with overlying atelectasis. Likely malignant effusion. Recommend correlation with recent thoracentesis. 4. The left lung is relatively clear. No obvious pulmonary metastatic lesions. 5. Cirrhosis and splenomegaly without obvious hepatic lesions or upper abdominal metastatic disease. 6. Advanced atherosclerotic calcifications involving the thoracic and abdominal aorta and extensively involving the coronary arteries. Aortic Atherosclerosis (ICD10-I70.0) and Emphysema (ICD10-J43.9). Electronically Signed   By: Marijo Sanes M.D.   On: 08/12/2017 15:35   Dg Chest Port 1 View  Result Date: 08/13/2017 CLINICAL DATA:  Respiratory failure EXAM: PORTABLE CHEST 1 VIEW COMPARISON:  08/11/2017 FINDINGS: Cardiac shadow is again enlarged. Right chest wall port is again seen. Aortic calcifications are again noted. The left lung remains clear. Small recurrent pleural effusion is noted similar to that seen on prior CT examination. Persistent  right upper lobe mass lesion is noted. IMPRESSION: Stable right upper lobe mass. Increasing right-sided pleural effusion. Electronically Signed   By: Inez Catalina M.D.   On: 08/13/2017 07:13   US Thoracentesis Asp Pleural Space W/img Guide  Result Date: 08/11/2017 INDICATION: Right pleural effusion EXAM: ULTRASOUND GUIDED RIGHT THORACENTESIS MEDICATIONS: None.  COMPLICATIONS: None immediate. PROCEDURE: An ultrasound guided thoracentesis was thoroughly discussed with the patient and questions answered. The benefits, risks, alternatives and complications were also discussed. The patient understands and wishes to proceed with the procedure. Written consent was obtained. Ultrasound was performed to localize and mark an adequate pocket of fluid in the right chest. The area was then prepped and draped in the normal sterile fashion. 1% Lidocaine was used for local anesthesia. Under ultrasound guidance a 6 Fr Safe-T-Centesis catheter was introduced. Thoracentesis was performed. The catheter was removed and a dressing applied. FINDINGS: A total of approximately 1.1 L of dark amber fluid was removed. Samples were sent to the laboratory as requested by the clinical team. IMPRESSION: Successful ultrasound guided right thoracentesis yielding 1.1 L of pleural fluid. Electronically Signed   By: Inez Catalina M.D.   On: 08/11/2017 16:46   ASSESSMENT AND PLAN:  Anastasha Ortez  is a 82 y.o. female with a known history of EBV, chronic systolic CHF, CKD stage IV, anemia of chronic disease, GI bleed, diabetes, hypertension presents to the emergency room complaining of progressively worsening shortness of breath over a week. She has noticed increased lower extremity swelling, orthopnea  * Acute hypoxic respiratory failure due tonew moderate to large right pleural effusion Patient now off BiPAP.  -Received f IV Lasix. ---Holding it now given elevated creatinine and patient overall looks more dry. -Discussed with pulmonary  -Status post ultrasound-guided right thoracentesis.--- Removal of more than a liter of fluid -Preliminary result for pleural fluid positive for malignant cells -Chest x-ray shows right upper lobe mass versus loculated effusion. Given history of smoking many years, weight loss of 90 pounds over 1 year--- highly suspicious to be malignancy -CT of the chest without  contrast 4 x 4 cm right upper lobe lung mass consistent with neoplasm.There is bulky mediastinal lymphadenopathy and compression of theSVC and right upper lobe bronchus -Oncology consultation appreciated. Patient is not a candidate for any chemotherapy or radiation. No further aggressive workup recommended. Patient and family are agreeable. They are inclining towards home with hospice Lavone Neri were not much support at home and patient will be going to hospice facility for now.  *Right upper lobe lung mass with right-sided pleural effusion -Suspicious to be malignant effusion with negative results positive for carcinoma -CT chest as above. -we'll hold off further diuresis -Patient seen by palliative care. Upper she had their input and discussing various options with the patient and family.  * Acute COPD exacerbation Prn  nebulizers and oxygen  * Diabetes mellitus.  - sliding scale insulin.  * Acute kidney injury over CKD stage IV  -Patient received high dose of Lasix yesterday. We'll hold off Lasix. Creatinine creeping up.  * DVT prophylaxis with subcutaneous heparin  *Palliative care consultation appreciated  Case discussed with Care Management/Social Worker. Management plans discussed with the patient, family and they are in agreement.  CODE STATUS: DO NOT RESUSCITATE  DVT Prophylaxis: Heparin  TOTAL TIME TAKING CARE OF THIS PATIENT: 30  minutes.  >50% time spent on counselling and coordination of carePatient and daughter  POSSIBLE D/C IN *1-2* DAYS, DEPENDING ON CLINICAL CONDITION.  Note: This dictation was  prepared with Dragon dictation along with smaller phrase technology. Any transcriptional errors that result from this process are unintentional.  Doroteo Nickolson M.D on 08/13/2017 at 1:23 PM  Between 7am to 6pm - Pager - [phone]  After 6pm go to [email or web address] - password [email or web address] [facility]  Office  [phone]  CC: Primary care physician;  Kirk Ruths, MD

## 2017-08-13 NOTE — Progress Notes (Signed)
Chaplain responded to a page to attend to pt's family were were in distraught and tearful because of pt's poor digenesis. Romeo met pt and family at bedside. Pt talked her his life, she mentioned that she stopped smoking 4 years ago; she talked about things she did with her sister when she was little, and she also expressed that she was prepared to go. As the patient talked about things that are important to her, the family listened and seemed calm and willing to allow pt to direct the conversation about her health option. [place] provide spiritual support and a ministry of presence.    08/13/17 1100  Clinical Encounter Type  Visited With Patient and family together  Visit Type Initial;Follow-up  Referral From Nurse  Consult/Referral To Chaplain  Spiritual Encounters  Spiritual Needs Prayer;Emotional

## 2017-08-13 NOTE — Progress Notes (Signed)
Pt w/ reduced EF CHF not on an ACE or ARB. Pt not a candidate for ACE/ARB due to blood pressure, renal function, and malignancy  Daulton Harbaugh D Maly Lemarr, Pharm.D, BCPS Clinical Pharmacist

## 2017-08-13 NOTE — Consult Note (Signed)
[place] NOTE  Patient Care Team: Kirk Ruths, MD as PCP - General (Internal Medicine) Cammie Sickle, MD as Consulting Physician (Hematology and Oncology)  CHIEF COMPLAINTS/PURPOSE OF CONSULTATION:  Lung masses/right pleural effusion  HISTORY OF PRESENTING ILLNESS:  Morgan [name] 82 y.o.  female with multiple medical problems including chronic anemia-secondary to chronic GI bleed/chronic kidney disease; congestive heart failure/CAD also history of smoking quit few years ago is currently admitted to the hospital for worsening shortness of breath.  On admission patient noted to have a large right-sided pleural effusion; and also right upper lobe lung mass/mediastinal adenopathy highly suspicious of malignancy. Patient is currently status post thoracentesis. Patient's creatinine is around 3.5 [baseline around 2].   Overall patient has been doing poorly with the last many months. She is currently living at home with her son. She is mostly ambulatory using a walker. She has been losing weight. Poor appetite. Positive for cough.  Patient breathing is improved status post thoracentesis. However continues to be oxygen.   ROS: A complete 10 point review of system is done which is negative except mentioned above in history of present illness  MEDICAL HISTORY:  Past Medical History:  Diagnosis Date  . Abnormal LFTs (liver function tests)   . Adenomatous polyps 03/05/2012  . Angiodysplasia   . Aortic stenosis   . Arthritis   . AV malformation of GI tract   . Bilateral cellulitis of lower leg   . CAD (coronary artery disease)   . Cardiomyopathy ([place])   . Carotid bruit   . Cervical cancer ([place])    cervical cancer history  . CHF (congestive heart failure) ([place])   . Chronic edema   . Clostridium difficile infection March 02, 2015  . Complication of anesthesia     I have trouble waking up "  . COPD (chronic obstructive pulmonary disease) ([place])   .  Diabetes mellitus with stage 3 chronic kidney disease ([place])   . Fatty liver   . Fibrocystic breast changes   . GERD (gastroesophageal reflux disease)   . GI bleed   . Gout   . History of blood transfusion   . History of nicotine use   . Hyperlipidemia   . Hypertension   . Iron deficiency anemia 2008  . Irritable bowel syndrome   . Macrocytosis   . Osteoarthritis   . Peptic ulcer disease   . PVD (peripheral vascular disease) ([place])   . Retinal vein occlusion of left eye   . Tobacco abuse   . Trigeminal neuralgia   . Vascular malformation 06/14/1997    SURGICAL HISTORY: Past Surgical History:  Procedure Laterality Date  . ABDOMINAL HYSTERECTOMY    . angiogram cardiac stenting    . BONE MARROW BIOPSY  1999  . CARDIAC CATHETERIZATION N/A 12/02/2016   Procedure: Left Heart Cath and Coronary Angiography;  Surgeon: Wellington Hampshire, MD;  Location: [facility];  Service: Cardiovascular;  Laterality: N/A;  . CARDIAC CATHETERIZATION N/A 12/02/2016   Procedure: Coronary Balloon Angioplasty;  Surgeon: Wellington Hampshire, MD;  Location: [facility];  Service: Cardiovascular;  Laterality: N/A;  . CHOLECYSTECTOMY    . COLONOSCOPY  12/2014, 2014, 2013, 2008, 2004   03/05/2012-Adenomatous Polyps;09/23/2003-Polyp remains intact;   . COLONOSCOPY N/A 07/12/2016   Procedure: COLONOSCOPY;  Surgeon: Ronald Lobo, MD;  Location: [facility] ENDOSCOPY;  Service: Endoscopy;  Laterality: N/A;  . COLONOSCOPY N/A 08/23/2016   Procedure: COLONOSCOPY;  Surgeon: Lucilla Lame, MD;  Location: [facility] ENDOSCOPY;  Service: Endoscopy;  Laterality: N/A;  . COLONOSCOPY WITH PROPOFOL N/A 03/26/2017   Procedure: COLONOSCOPY WITH PROPOFOL;  Surgeon: [name], MD;  Location: [facility] ENDOSCOPY;  Service: Endoscopy;  Laterality: N/A;  . Endoscopic Carpal Tunnel Release    . ESOPHAGOGASTRODUODENOSCOPY  12/2014, 2014, 2013, 2009, 2004  . ESOPHAGOGASTRODUODENOSCOPY (EGD) WITH PROPOFOL N/A 07/12/2016   Procedure:  ESOPHAGOGASTRODUODENOSCOPY (EGD) WITH PROPOFOL;  Surgeon: Ronald Lobo, MD;  Location: [place];  Service: Endoscopy;  Laterality: N/A;  . ESOPHAGOGASTRODUODENOSCOPY (EGD) WITH PROPOFOL N/A 08/23/2016   Procedure: ESOPHAGOGASTRODUODENOSCOPY (EGD) WITH PROPOFOL;  Surgeon: Lucilla Lame, MD;  Location: [facility] ENDOSCOPY;  Service: Endoscopy;  Laterality: N/A;  . ESOPHAGOGASTRODUODENOSCOPY (EGD) WITH PROPOFOL N/A 03/25/2017   Procedure: ESOPHAGOGASTRODUODENOSCOPY (EGD) WITH PROPOFOL;  Surgeon: Lucilla Lame, MD;  Location: [facility] ENDOSCOPY;  Service: Endoscopy;  Laterality: N/A;  . HOT HEMOSTASIS N/A 07/12/2016   Procedure: HOT HEMOSTASIS (ARGON PLASMA COAGULATION/BICAP);  Surgeon: Ronald Lobo, MD;  Location: [facility] ENDOSCOPY;  Service: Endoscopy;  Laterality: N/A;  . NASAL SINUS SURGERY    . TEE WITHOUT CARDIOVERSION N/A 12/19/2015   Procedure: TRANSESOPHAGEAL ECHOCARDIOGRAM (TEE);  Surgeon: Yolonda Kida, MD;  Location: [facility] ORS;  Service: Cardiovascular;  Laterality: N/A;  . THORACENTESIS    . TONSILLECTOMY      SOCIAL HISTORY: Social History   Social History  . Marital status: Married    Spouse name: N/A  . Number of children: N/A  . Years of education: N/A   Occupational History  . Not on file.   Social History Main Topics  . Smoking status: Former Smoker    Packs/day: 0.50    Years: 56.00    Types: Cigarettes    Quit date: 05/23/2013  . Smokeless tobacco: Never Used  . Alcohol use No  . Drug use: No  . Sexual activity: No   Other Topics Concern  . Not on file   Social History Narrative  . No narrative on file    FAMILY HISTORY: Family History  Problem Relation Age of Onset  . Parkinsonism Mother   . Heart attack Mother   . Lung cancer Father   . Diabetes Mellitus II Father   . Asthma Sister   . Asthma Brother   . Heart disease Son     ALLERGIES:  is allergic to aspirin; atenolol; clopidogrel bisulfate; darvon [propoxyphene]; hydrochlorothiazide; iodinated  diagnostic agents; nylon; other; and pravastatin.  MEDICATIONS:  Current Facility-Administered Medications  Medication Dose Route Frequency Provider Last Rate Last Dose  . acetaminophen (TYLENOL) tablet 650 mg  650 mg Oral Q6H PRN Hillary Bow, MD       Or  . acetaminophen (TYLENOL) suppository 650 mg  650 mg Rectal Q6H PRN Sudini, Alveta Heimlich, MD      . amiodarone (PACERONE) tablet 200 mg  200 mg Oral Daily Hillary Bow, MD   200 mg at 08/13/17 1114  . antiseptic oral rinse (BIOTENE) solution 15 mL  15 mL Topical PRN Dellinger, Haynes Dage L, PA-C      . budesonide (PULMICORT) nebulizer solution 0.25 mg  0.25 mg Nebulization Q6H PRN Dellinger, Bobby Rumpf, PA-C      . carvedilol (COREG) tablet 3.125 mg  3.125 mg Oral BID Hillary Bow, MD   3.125 mg at 08/13/17 1114  . glycopyrrolate (ROBINUL) tablet 1 mg  1 mg Oral Q4H PRN Dellinger, Haynes Dage L, PA-C       Or  . glycopyrrolate (ROBINUL) injection 0.2 mg  0.2 mg Subcutaneous Q4H PRN Dellinger, Haynes Dage  L, PA-C       Or  . glycopyrrolate (ROBINUL) injection 0.2 mg  0.2 mg Intravenous Q4H PRN Dellinger, Haynes Dage L, PA-C      . haloperidol (HALDOL) tablet 0.5 mg  0.5 mg Oral Q4H PRN Dellinger, Haynes Dage L, PA-C       Or  . haloperidol (HALDOL) 2 MG/ML solution 0.5 mg  0.5 mg Sublingual Q4H PRN Dellinger, Haynes Dage L, PA-C       Or  . haloperidol lactate (HALDOL) injection 0.5 mg  0.5 mg Intravenous Q4H PRN Dellinger, Marianne L, PA-C      . ipratropium-albuterol (DUONEB) 0.5-2.5 (3) MG/3ML nebulizer solution 3 mL  3 mL Nebulization Q2H PRN Dellinger, Bobby Rumpf, PA-C      . LORazepam (ATIVAN) 2 MG/ML concentrated solution 0.26 mg  0.26 mg Oral Q6H PRN Dellinger, Bobby Rumpf, PA-C      . LORazepam (ATIVAN) 2 MG/ML concentrated solution 0.26 mg  0.26 mg Oral QHS Dellinger, Marianne L, PA-C      . MEDLINE mouth rinse  15 mL Mouth Rinse BID Hillary Bow, MD   15 mL at 08/13/17 2007  . morphine 2 MG/ML injection 1-2 mg  1-2 mg Intravenous Q1H PRN  Dellinger, Marianne L, PA-C      . morphine CONCENTRATE 10 MG/0.5ML oral solution 2.6-5 mg  2.6-5 mg Oral Q1H PRN Dellinger, Marianne L, PA-C   2.6 mg at 08/13/17 1744  . ondansetron (ZOFRAN) tablet 4 mg  4 mg Oral Q6H PRN Hillary Bow, MD       Or  . ondansetron (ZOFRAN) injection 4 mg  4 mg Intravenous Q6H PRN Sudini, Srikar, MD      . pantoprazole (PROTONIX) EC tablet 40 mg  40 mg Oral BID Hillary Bow, MD   40 mg at 08/13/17 2007  . polyvinyl alcohol (LIQUIFILM TEARS) 1.4 % ophthalmic solution 1 drop  1 drop Both Eyes QID PRN Dellinger, Haynes Dage L, PA-C      . sodium chloride flush (NS) 0.9 % injection 3 mL  3 mL Intravenous Q12H Hillary Bow, MD   3 mL at 08/13/17 2008  . traZODone (DESYREL) tablet 50 mg  50 mg Oral QHS Hillary Bow, MD   50 mg at 08/12/17 2024   Facility-Administered Medications Ordered in Other Encounters  Medication Dose Route Frequency Provider Last Rate Last Dose  . acetaminophen (TYLENOL) tablet 650 mg  650 mg Oral Once Charlaine Dalton R, MD      . diphenhydrAMINE (BENADRYL) capsule 25 mg  25 mg Oral Once Charlaine Dalton R, MD      . epoetin alfa (EPOGEN,PROCRIT) injection 20,000 Units  20,000 Units Subcutaneous Once Charlaine Dalton R, MD      . furosemide (LASIX) injection 20 mg  20 mg Intravenous Once Charlaine Dalton R, MD      . heparin lock flush 100 unit/mL  500 Units Intracatheter Daily PRN Charlaine Dalton R, MD      . heparin lock flush 100 unit/mL  250 Units Intracatheter PRN Charlaine Dalton R, MD      . sodium chloride flush (NS) 0.9 % injection 10 mL  10 mL Intracatheter PRN Charlaine Dalton R, MD      . sodium chloride flush (NS) 0.9 % injection 3 mL  3 mL Intracatheter PRN Cammie Sickle, MD          .  PHYSICAL EXAMINATION:  Vitals:   08/13/17 1404 08/13/17 1953  BP:  (!) 107/58  Pulse:  82  Resp:  (!) 24  Temp:  98.2 F (36.8 C)  SpO2: 92% 91%   Filed Weights   08/11/17 1037 08/12/17 0557 08/13/17  0500  Weight: 163 lb (73.9 kg) 158 lb 4.6 oz (71.8 kg) 155 lb (70.3 kg)    GENERAL: [place] Caucasian female patient well-developed; Alert, no distress and comfortable. On oxygen. Accompanied by family EYES: no pallor or icterus OROPHARYNX: no thrush or ulceration. NECK: supple, no masses felt LYMPH:  no palpable lymphadenopathy in the cervical, axillary or inguinal regions LUNGS: decreased breath sounds to auscultation right side more than the left. No wheeze or crackles HEART/CVS: regular rate & rhythm and no murmurs; 2+ bilateral lower extremity edema.  ABDOMEN: abdomen soft, non-tender and normal bowel sounds Musculoskeletal:no cyanosis of digits and no clubbing  PSYCH: alert & oriented x 3 with fluent speech NEURO: no focal motor/sensory deficits SKIN:  no rashes or significant lesions  LABORATORY DATA:  I have reviewed the data as listed Lab Results  Component Value Date   WBC 4.4 08/12/2017   HGB 10.9 (L) 08/12/2017   HCT 29.9 (L) 08/12/2017   MCV 98.5 08/12/2017   PLT 120 (L) 08/12/2017    Recent Labs  06/30/17 1350 07/25/17 1045 08/11/17 1046 08/12/17 0540 08/13/17 0413  NA 137 141 138 136 139  K 3.0* 4.0 4.0 4.1 4.0  CL 89* 98* 95* 97* 98*  CO2 35* 32 29 26 28   GLUCOSE 183* 182* 215* 175* 109*  BUN 34* 39* 71* 77* 87*  CREATININE 2.12* 2.13* 3.66* 3.50* 3.35*  CALCIUM 8.6* 8.4* 8.3* 8.0* 7.5*  GFRNONAA 21* 21* 11* 11* 12*  GFRAA 24* 24* 13* 13* 14*  PROT 7.1 7.0 6.8  --   --   ALBUMIN 3.3* 3.3* 2.8*  --   --   AST 35 43* 41  --   --   ALT 24 32 27  --   --   ALKPHOS 160* 180* 145*  --   --   BILITOT 1.8* 1.6* 2.7*  --   --   BILIDIR  --   --  1.0*  --   --   IBILI  --   --  1.7*  --   --     RADIOGRAPHIC STUDIES: I have personally reviewed the radiological images as listed and agreed with the findings in the report. Dg Chest 1 View  Result Date: 08/11/2017 CLINICAL DATA:  Status post right thoracentesis EXAM: CHEST 1 VIEW COMPARISON:   08/11/2017 FINDINGS: Cardiac shadow is again enlarged. Aortic calcifications and mitral valve calcifications are noted. Right chest wall port is again seen and stable. There is been significant reduction in right-sided pleural effusion. A small amount of fluid remains. No pneumothorax is seen. There remains a pleural-based rounded density in the right apex which is stable from the recent exam. This remains suspicious for neoplasm an when the patient's condition improves a CT of the chest is recommended. IMPRESSION: No pneumothorax following thoracentesis on the right. A small residual effusion remains. Rounded masslike density in the right apex suspicious for underlying neoplasm given the effusion. CT of the chest with contrast when the patient's condition improves is recommended. Electronically Signed   By: Inez Catalina M.D.   On: 08/11/2017 16:50   Ct Chest Wo Contrast  Result Date: 08/12/2017 CLINICAL DATA:  Progressively worsening shortness of breath and abnormal chest x-rays. EXAM: CT CHEST WITHOUT CONTRAST TECHNIQUE: Multidetector CT imaging of the chest was performed following the standard protocol  without IV contrast. COMPARISON:  Chest x-ray 08/11/2017 FINDINGS: Cardiovascular: The heart is enlarged. There is tortuosity and dense calcification of the thoracic aorta and dense and extensive three-vessel coronary artery calcifications. Dense mitral valve annular calcifications. Right IJ Port-A-Cath in good position. Mediastinum/Nodes: Bulky and extensive mediastinal lymphadenopathy. Large paratracheal mass measuring 3.6 cm is compressing the SVC. This also compression of the right upper lobe bronchus. Contralateral prevascular lymphadenopathy is also noted with maximal short axis measurement of 13.5 mm on image number 46. Subcarinal adenopathy measures 24.5 mm on image number 62. Lungs/Pleura: 4 x 4 cm right upper lobe lung mass with extensive interstitial and airspace process in the right upper lobe. This  is worrisome for lymphangitic spread of tumor and/or possible bronchial spread of tumor. It also could be surrounding pneumonia. There is a moderate size right pleural effusion with overlying right lower lobe atelectasis. This is certainly worrisome for a malignant effusion. Recommend correlation with recent thoracentesis No obvious metastatic pulmonary nodules in the left lung. Underlying emphysematous changes. Upper Abdomen: Cirrhotic changes involving the liver and splenomegaly. No obvious hepatic mass. Advanced atherosclerotic calcifications involving the abdominal aorta and branch vessels. Musculoskeletal: No significant bony findings. IMPRESSION: 1. 4 x 4 cm right upper lobe lung mass consistent with neoplasm. There is bulky mediastinal lymphadenopathy and compression of the SVC and right upper lobe bronchus. 2. Extensive interstitial and airspace process in the right upper lobe could be interstitial/lymphatic spread of tumor or possibly obstructive pneumonia. 3. Moderate sized right pleural effusion with overlying atelectasis. Likely malignant effusion. Recommend correlation with recent thoracentesis. 4. The left lung is relatively clear. No obvious pulmonary metastatic lesions. 5. Cirrhosis and splenomegaly without obvious hepatic lesions or upper abdominal metastatic disease. 6. Advanced atherosclerotic calcifications involving the thoracic and abdominal aorta and extensively involving the coronary arteries. Aortic Atherosclerosis (ICD10-I70.0) and Emphysema (ICD10-J43.9). Electronically Signed   By: Marijo Sanes M.D.   On: 08/12/2017 15:35   Dg Chest Port 1 View  Result Date: 08/13/2017 CLINICAL DATA:  Respiratory failure EXAM: PORTABLE CHEST 1 VIEW COMPARISON:  08/11/2017 FINDINGS: Cardiac shadow is again enlarged. Right chest wall port is again seen. Aortic calcifications are again noted. The left lung remains clear. Small recurrent pleural effusion is noted similar to that seen on prior CT  examination. Persistent right upper lobe mass lesion is noted. IMPRESSION: Stable right upper lobe mass. Increasing right-sided pleural effusion. Electronically Signed   By: Inez Catalina M.D.   On: 08/13/2017 07:13   Dg Chest Port 1 View  Result Date: 08/11/2017 CLINICAL DATA:  Shortness of breath. EXAM: PORTABLE CHEST 1 VIEW COMPARISON:  09/02/2016 report. Decubitus chest x-ray 05/31/2013. AP chest 07/10/2012 . FINDINGS: Port-A-Cath noted with tip over the cavoatrial junction. Cardiomegaly with pulmonary vascular prominence and bilateral interstitial prominence suggesting CHF. Prominent right pleural effusion. Prominent rounded density noted over the right upper lung. This could represent loculated pleural fluid, pulmonary infiltrate, or pulmonary mass . Follow-up exams demonstrate clearing suggested . IMPRESSION: 1. Port-A-Cath noted with tip over the cavoatrial junction. 2. Cardiomegaly with pulmonary vascular prominence and bilateral interstitial prominence. Prominent right pleural effusion. Findings suggest CHF. 3. Prominent rounded density noted of the right upper lung. This could represent loculated pleural fluid, pulmonary infiltrate, pulmonary mass. Follow-up exam suggested to demonstrate clearing. Electronically Signed   By: Marcello Moores  Register   On: 08/11/2017 11:22   US Thoracentesis Asp Pleural Space W/img Guide  Result Date: 08/11/2017 INDICATION: Right pleural effusion EXAM: ULTRASOUND GUIDED RIGHT THORACENTESIS MEDICATIONS:  None. COMPLICATIONS: None immediate. PROCEDURE: An ultrasound guided thoracentesis was thoroughly discussed with the patient and questions answered. The benefits, risks, alternatives and complications were also discussed. The patient understands and wishes to proceed with the procedure. Written consent was obtained. Ultrasound was performed to localize and mark an adequate pocket of fluid in the right chest. The area was then prepped and draped in the normal sterile fashion.  1% Lidocaine was used for local anesthesia. Under ultrasound guidance a 6 Fr Safe-T-Centesis catheter was introduced. Thoracentesis was performed. The catheter was removed and a dressing applied. FINDINGS: A total of approximately 1.1 L of dark amber fluid was removed. Samples were sent to the laboratory as requested by the clinical team. IMPRESSION: Successful ultrasound guided right thoracentesis yielding 1.1 L of pleural fluid. Electronically Signed   By: Inez Catalina M.D.   On: 08/11/2017 16:46    ASSESSMENT & PLAN:   # 82 year old female patient with multiple medical problems admitted the hospital for worsening shortness of breath  # Worsening shortness of breath loculated pleural effusion- right lung mass-status post thoracentesis; preliminary pathology positive for malignancy. Patient poor candidate for any chemotherapy/immunotherapy- given significant comorbidities/poor functional status.   # CHF/chronic kidney disease- with acute renal failure- ? Cardio-renal syndrome.   # chronic anemia-thrombocytopenia- currently stable.  # After lengthy discussion with the patient/multiple family members including son/sister and son-in-law- agreeable with hospice. Patient declines Pleurx catheter- which I think is reasonable given Overall poor prognosis. Discussed that the life expectancy in order of few weeks. Also spoke to palliative care nurse, Stanton Kidney. Also discussed with Dr.Patel.   #  Thank you Dr. Posey Pronto for allowing me to participate in the care of your pleasant patient. Please do not hesitate to contact me with questions or concerns in the interim.  # 60 minutes face-to-face with the patient discussing the above plan of care; more than 50% of time spent on prognosis/ natural history; counseling and coordination.    Cammie Sickle, MD 08/13/2017 9:32 PM

## 2017-08-14 LAB — CYTOLOGY - NON PAP

## 2017-08-14 MED ORDER — HALOPERIDOL LACTATE 2 MG/ML PO CONC: 1.0000 mg | Freq: Three times a day (TID) | ORAL | 0 refills | Status: AC | PRN

## 2017-08-14 MED ORDER — LORAZEPAM 2 MG/ML PO CONC
1.0000 mg | Freq: Four times a day (QID) | ORAL | Status: DC | PRN
  Administered 2017-08-14: 11:00:00 1 mg via ORAL
  Filled 2017-08-14: qty 1

## 2017-08-14 MED ORDER — POLYVINYL ALCOHOL 1.4 % OP SOLN: 1.0000 [drp] | Freq: Four times a day (QID) | OPHTHALMIC | 0 refills | Status: AC | PRN

## 2017-08-14 MED ORDER — LORAZEPAM 2 MG/ML PO CONC: 1.0000 mg | Freq: Four times a day (QID) | ORAL | 0 refills | Status: AC | PRN

## 2017-08-14 MED ORDER — GLYCOPYRROLATE 1 MG PO TABS: 1.0000 mg | ORAL_TABLET | ORAL | 0 refills | Status: AC | PRN

## 2017-08-14 MED ORDER — LORAZEPAM 2 MG/ML PO CONC: 1.0000 mg | Freq: Every day | ORAL | Status: DC

## 2017-08-14 MED ORDER — MORPHINE SULFATE (CONCENTRATE) 10 MG/0.5ML PO SOLN: 2.5000 mg | ORAL | 0 refills | Status: AC | PRN

## 2017-08-14 NOTE — Care Management Important Message (Signed)
Important Message  Patient Details  Name: Morgan [name] MRN: [id number] Date of Birth: Sep 20, 1935   Medicare Important Message Given:  Yes    Shelbie Ammons, RN 08/14/2017, 8:18 AM

## 2017-08-14 NOTE — Clinical Social Work Note (Signed)
CSW received consult "Patient has decided she wants to go to hospice house. Please put her on the list for [place]." Hospice Liaison was notified, and is following for arrangments of transfer. CSW prepared packet for transfer. [facility] will provide transportation. CSW is signing off as no further needs identified.   Darden Dates, MSW, LCSW  Clinical Social Worker  [phone]

## 2017-08-14 NOTE — Progress Notes (Signed)
[facility] NAME: Morgan [name]    MR#:  [id number]  DATE OF BIRTH:  05-Sep-1935  SUBJECTIVE:   Came in with increasing shortness of breath weakness and weight loss Pt is resting quietly , no distress. She is comfort care only REVIEW OF SYSTEMS:   Review of Systems  Constitutional: Positive for weight loss. Negative for chills and fever.  HENT: Negative for ear discharge, ear pain and nosebleeds.   Eyes: Negative for blurred vision, pain and discharge.  Respiratory: Positive for shortness of breath. Negative for sputum production, wheezing and stridor.   Cardiovascular: Positive for orthopnea. Negative for chest pain, palpitations and PND.  Gastrointestinal: Negative for abdominal pain, diarrhea, nausea and vomiting.  Genitourinary: Negative for frequency and urgency.  Musculoskeletal: Negative for back pain and joint pain.  Neurological: Positive for weakness. Negative for sensory change, speech change and focal weakness.  Psychiatric/Behavioral: Negative for depression and hallucinations. The patient is not nervous/anxious.    Tolerating Diet:yes Tolerating PT: hospice home  DRUG ALLERGIES:   Allergies  Allergen Reactions  . Aspirin Other (See Comments)    bleeding  . Atenolol Other (See Comments)    fatigue  . Clopidogrel Bisulfate Other (See Comments)    bleeding  . Darvon [Propoxyphene] Hives and Other (See Comments)  . Hydrochlorothiazide Other (See Comments)    Fatigue and cramps  . Iodinated Diagnostic Agents Other (See Comments)    Questionable GI bleed  . Nylon Hives  . Other Other (See Comments)    Nylon sutures  . Pravastatin Other (See Comments)    Severe cramping    VITALS:  Blood pressure (!) 100/59, pulse 75, temperature 97.7 F (36.5 C), temperature source Oral, resp. rate (!) 24, height 5\' 6"  (1.676 m), weight 70.3 kg (155 lb), SpO2 95 %.  PHYSICAL EXAMINATION:   Physical  Exam  GENERAL:  82 y.o.-year-old patient lying in the bed with no acute distress. Chronically ill EYES: Pupils equal, round, reactive to light and accommodation. No scleral icterus. Extraocular muscles intact.  HEENT: Head atraumatic, normocephalic. Oropharynx and nasopharynx clear.  NECK:  Supple, no jugular venous distention. No thyroid enlargement, no tenderness.  LUNGS: decreased breath sounds bilaterally, no wheezing, rales, rhonchi. No use of accessory muscles of respiration.  CARDIOVASCULAR: S1, S2 normal. No murmurs, rubs, or gallops.  ABDOMEN: Soft, nontender, nondistended. Bowel sounds present. No organomegaly or mass.  EXTREMITIES: No cyanosis, clubbing or edema b/l.   Chronic leg edema with significant wrinkling of the skin NEUROLOGIC: Cranial nerves II through XII are intact. No focal Motor or sensory deficits b/l.   PSYCHIATRIC:  patient is alert but sleepy SKIN: No obvious rash, lesion, or ulcer.   LABORATORY PANEL:  CBC  Recent Labs Lab 08/12/17 0540  WBC 4.4  HGB 10.9*  HCT 29.9*  PLT 120*    Chemistries   Recent Labs Lab 08/11/17 1046  08/13/17 0413  NA 138  < > 139  K 4.0  < > 4.0  CL 95*  < > 98*  CO2 29  < > 28  GLUCOSE 215*  < > 109*  BUN 71*  < > 87*  CREATININE 3.66*  < > 3.35*  CALCIUM 8.3*  < > 7.5*  AST 41  --   --   ALT 27  --   --   ALKPHOS 145*  --   --   BILITOT 2.7*  --   --   < > =  values in this interval not displayed. Cardiac Enzymes  Recent Labs Lab 08/11/17 1046  TROPONINI 0.12*   RADIOLOGY:  Ct Chest Wo Contrast  Result Date: 08/12/2017 CLINICAL DATA:  Progressively worsening shortness of breath and abnormal chest x-rays. EXAM: CT CHEST WITHOUT CONTRAST TECHNIQUE: Multidetector CT imaging of the chest was performed following the standard protocol without IV contrast. COMPARISON:  Chest x-ray 08/11/2017 FINDINGS: Cardiovascular: The heart is enlarged. There is tortuosity and dense calcification of the thoracic aorta and dense  and extensive three-vessel coronary artery calcifications. Dense mitral valve annular calcifications. Right IJ Port-A-Cath in good position. Mediastinum/Nodes: Bulky and extensive mediastinal lymphadenopathy. Large paratracheal mass measuring 3.6 cm is compressing the SVC. This also compression of the right upper lobe bronchus. Contralateral prevascular lymphadenopathy is also noted with maximal short axis measurement of 13.5 mm on image number 46. Subcarinal adenopathy measures 24.5 mm on image number 62. Lungs/Pleura: 4 x 4 cm right upper lobe lung mass with extensive interstitial and airspace process in the right upper lobe. This is worrisome for lymphangitic spread of tumor and/or possible bronchial spread of tumor. It also could be surrounding pneumonia. There is a moderate size right pleural effusion with overlying right lower lobe atelectasis. This is certainly worrisome for a malignant effusion. Recommend correlation with recent thoracentesis No obvious metastatic pulmonary nodules in the left lung. Underlying emphysematous changes. Upper Abdomen: Cirrhotic changes involving the liver and splenomegaly. No obvious hepatic mass. Advanced atherosclerotic calcifications involving the abdominal aorta and branch vessels. Musculoskeletal: No significant bony findings. IMPRESSION: 1. 4 x 4 cm right upper lobe lung mass consistent with neoplasm. There is bulky mediastinal lymphadenopathy and compression of the SVC and right upper lobe bronchus. 2. Extensive interstitial and airspace process in the right upper lobe could be interstitial/lymphatic spread of tumor or possibly obstructive pneumonia. 3. Moderate sized right pleural effusion with overlying atelectasis. Likely malignant effusion. Recommend correlation with recent thoracentesis. 4. The left lung is relatively clear. No obvious pulmonary metastatic lesions. 5. Cirrhosis and splenomegaly without obvious hepatic lesions or upper abdominal metastatic disease. 6.  Advanced atherosclerotic calcifications involving the thoracic and abdominal aorta and extensively involving the coronary arteries. Aortic Atherosclerosis (ICD10-I70.0) and Emphysema (ICD10-J43.9). Electronically Signed   By: Marijo Sanes M.D.   On: 08/12/2017 15:35   Dg Chest Port 1 View  Result Date: 08/13/2017 CLINICAL DATA:  Respiratory failure EXAM: PORTABLE CHEST 1 VIEW COMPARISON:  08/11/2017 FINDINGS: Cardiac shadow is again enlarged. Right chest wall port is again seen. Aortic calcifications are again noted. The left lung remains clear. Small recurrent pleural effusion is noted similar to that seen on prior CT examination. Persistent right upper lobe mass lesion is noted. IMPRESSION: Stable right upper lobe mass. Increasing right-sided pleural effusion. Electronically Signed   By: Inez Catalina M.D.   On: 08/13/2017 07:13   ASSESSMENT AND PLAN:  Morgan [name]  is a 82 y.o. female with a known history of EBV, chronic systolic CHF, CKD stage IV, anemia of chronic disease, GI bleed, diabetes, hypertension presents to the emergency room complaining of progressively worsening shortness of breath over a week. She has noticed increased lower extremity swelling, orthopnea  * Acute hypoxic respiratory failure due tonew moderate to large right pleural effusion Patient now off BiPAP.  -Received f IV Lasix. ---Holding it now given elevated creatinine and patient overall looks more dry. -Discussed with pulmonary  -Status post ultrasound-guided right thoracentesis.--- Removal of more than a liter of fluid -Preliminary result for pleural fluid positive  for malignant cells -Chest x-ray shows right upper lobe mass versus loculated effusion. Given history of smoking many years, weight loss of 90 pounds over 1 year--- highly suspicious to be malignancy -CT of the chest without contrast 4 x 4 cm right upper lobe lung mass consistent with neoplasm.There is bulky mediastinal lymphadenopathy and compression of  theSVC and right upper lobe bronchus -Oncology consultation appreciated. Patient is not a candidate for any chemotherapy or radiation. No further aggressive workup recommended. Patient and family are agreeable.  -pt is COMFORT CARE only and will go to Hospice home when bed opens up.  *Right upper lobe lung mass with right-sided pleural effusion -Suspicious to be malignant effusion with negative results positive for carcinoma -CT chest as above. -Patient seen by palliative care. Appreciate input  * Acute COPD exacerbation Prn  nebulizers and oxygen  * Diabetes mellitus.  - sliding scale insulin.  * Acute kidney injury over CKD stage IV   * DVT prophylaxis with subcutaneous heparin   Case discussed with Care Management/Social Worker.   CODE STATUS: DO NOT RESUSCITATE  DVT Prophylaxis: Heparin  TOTAL TIME TAKING CARE OF THIS PATIENT: 99minutes.  >50% time spent on counselling and coordination of carePatient and daughter  D/c when bed opens up at hospice home  Note: This dictation was prepared with Dragon dictation along with smaller phrase technology. Any transcriptional errors that result from this process are unintentional.  Ellanore Vanhook M.D on 08/14/2017 at 7:15 AM  Between 7am to 6pm - Pager - [phone]  After 6pm go to [email or web address] - password [email or web address]  Office  [phone]  CC: Primary care physician; Kirk Ruths, MD

## 2017-08-14 NOTE — Consult Note (Signed)
New referral for the hospice home on 82 yo female- post palliative medicine consult patient- who was admitted on 8.20.18 with acute hypoxic respiratory failure due to CHF and right pleural effusion.  She was started on BiPap, lasix and had a right thoracentesis draining 1.1 liters.  CT scan revealed new 4x4 mass compressing the SVC with extensive associated hilar adenopathy and possible post obstructive pna.  She has a history of CVA, Cirrhosis, fatty liver, CKD IV, CHF, CAD, cervical cancer, DM, COPD, Gout, hyperlipidemia, HTN, iron deficiency anemia, IBS, aortic stenosis, arthritis, AV malformation of GI track.  I met with the patient, her younger sister Thayer Headings, son Zenia Resides and older brother to discuss the hospice home and hospice philosophy.  She states she wishes to go to the hospice home for management of her dyspnea and anxiety.  Ms. Sanluis is very short of breath using accessory muscles.  I spoke with her nurse Ok Edwards, who will give morphine and has called the MD for ativan order.  She states she is accepting of her new diagnosis and the short time she has left.  She states she is just worried about her family and wants them to "stop this mess!".. Gesturing over the her son and baby sister that are very tearful and upset.  We all talked openly about the suddenness of her illness and the bad news they received.   We talked briefly about coping and just trying to process the information.  Support given and education provided to them of services we offer at hospice for bereavement counseling.  Consents signed and faxed to referral intake along with hospital information.  EMS to be set for 1100 pick up.  Portable DNR to accompany the patient.  Thank you for allowing participation in this patient's care.  Dimas Aguas, RN

## 2017-08-14 NOTE — Progress Notes (Signed)
RN called report to [place] at [facility].

## 2017-08-14 NOTE — Discharge Summary (Signed)
[facility] NAME: Morgan [name]    MR#:  [id number]  DATE OF BIRTH:  February 11, 1935  DATE OF ADMISSION:  08/11/2017 ADMITTING PHYSICIAN: Morgan Bow, MD  DATE OF DISCHARGE: 08/14/2017  PRIMARY CARE PHYSICIAN: Morgan Ruths, MD    ADMISSION DIAGNOSIS:  Respiratory distress [R06.03] Pleural effusion on right [J90] Acute on chronic systolic congestive heart failure (HCC) [I50.23]  DISCHARGE DIAGNOSIS:  Acute hypoxic respiratory failure secondary to right malignant pleural effusion Pleural effusion right malignant status post thoracentesis Lung mass with bulky lymphadenopathy suggestive of possible lung cancer----patient not a candidate for any aggressive workup Failure to thrive  SECONDARY DIAGNOSIS:   Past Medical History:  Diagnosis Date  . Abnormal LFTs (liver function tests)   . Adenomatous polyps 03/05/2012  . Angiodysplasia   . Aortic stenosis   . Arthritis   . AV malformation of GI tract   . Bilateral cellulitis of lower leg   . CAD (coronary artery disease)   . Cardiomyopathy ([place])   . Carotid bruit   . Cervical cancer ([place])    cervical cancer history  . CHF (congestive heart failure) ([place])   . Chronic edema   . Clostridium difficile infection March 02, 2015  . Complication of anesthesia     I have trouble waking up "  . COPD (chronic obstructive pulmonary disease) ([place])   . Diabetes mellitus with stage 3 chronic kidney disease ([place])   . Fatty liver   . Fibrocystic breast changes   . GERD (gastroesophageal reflux disease)   . GI bleed   . Gout   . History of blood transfusion   . History of nicotine use   . Hyperlipidemia   . Hypertension   . Iron deficiency anemia 2008  . Irritable bowel syndrome   . Macrocytosis   . Osteoarthritis   . Peptic ulcer disease   . PVD (peripheral vascular disease) ([place])   . Retinal vein occlusion of left eye   . Tobacco abuse   . Trigeminal neuralgia   .  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 She has noticed increased lower extremity swelling, orthopnea  * Acute hypoxic respiratory failure due tonew moderate to large right pleural effusion Patient now off BiPAP.  --Status post ultrasound-guided right thoracentesis.--- Removal of more than a liter of fluid -Preliminary result for pleural fluid positive for malignant cells -Chest x-ray shows right upper lobe mass versus loculated effusion. Given history of smoking many years, weight loss of 90 pounds over 1 year--- highly suspicious to be malignancy -CT of the chest without contrast 4 x 4 cm right upper lobe lung mass consistent with neoplasm.There is bulky mediastinal lymphadenopathy and compression of theSVC and right upper lobe bronchus -Oncology consultation appreciated. Patient is not a candidate for any chemotherapy or radiation. No further aggressive workup recommended. Patient and family are agreeable.  -pt is COMFORT CARE only and will go to Hospice home  *Right upper lobe lung mass with right-sided pleural effusion -Suspicious to be malignant effusion with negative results positive for carcinoma -CT chest as above. -Patient seen by palliative care. Appreciate input  * Acute COPD exacerbation Prn  nebulizers and oxygen  * Diabetes mellitus.  - sliding scale  insulin.  * Acute kidney injury over CKD stage IV   * DVT prophylaxis with subcutaneous heparin CONSULTS OBTAINED:  Treatment Team:  Morgan Sickle, MD  DRUG ALLERGIES:   Allergies  Allergen Reactions  . Aspirin Other (See Comments)    bleeding  . Atenolol Other (See Comments)    fatigue  . Clopidogrel Bisulfate Other (See  Comments)    bleeding  . Darvon [Propoxyphene] Hives and Other (See Comments)  . Hydrochlorothiazide Other (See Comments)    Fatigue and cramps  . Iodinated Diagnostic Agents Other (See Comments)    Questionable GI bleed  . Nylon Hives  . Other Other (See Comments)    Nylon sutures  . Pravastatin Other (See Comments)    Severe cramping    DISCHARGE MEDICATIONS:   Current Discharge Medication List    START taking these medications   Details  glycopyrrolate (ROBINUL) 1 MG tablet Take 1 tablet (1 mg total) by mouth every 4 (four) hours as needed (excessive secretions). Qty: 30 tablet, Refills: 0    haloperidol (HALDOL) 2 MG/ML solution Place 0.5 mLs (1 mg total) under the tongue every 8 (eight) hours as needed for agitation (or delirium). Qty: 30 mL, Refills: 0    LORazepam (ATIVAN) 2 MG/ML concentrated solution Take 0.5 mLs (1 mg total) by mouth every 6 (six) hours as needed for anxiety or sleep. Qty: 30 mL, Refills: 0    Morphine Sulfate (MORPHINE CONCENTRATE) 10 MG/0.5ML SOLN concentrated solution Take 0.13-0.25 mLs (2.6-5 mg total) by mouth every hour as needed for moderate pain or shortness of breath. Qty: 180 mL, Refills: 0    polyvinyl alcohol (LIQUIFILM TEARS) 1.4 % ophthalmic solution Place 1 drop into both eyes 4 (four) times daily as needed for dry eyes. Qty: 15 mL, Refills: 0      CONTINUE these medications which have NOT CHANGED   Details  acetaminophen (TYLENOL) 500 MG tablet Take 500 mg by mouth at bedtime.    amiodarone (PACERONE) 200 MG tablet Take 200 mg by mouth daily.     atorvastatin (LIPITOR) 40 MG tablet Take 1 tablet (40 mg total) by mouth daily at 6 PM. Qty: 30 tablet, Refills: 0    traZODone (DESYREL) 50 MG tablet Take 50 mg by mouth at bedtime.    diclofenac sodium (VOLTAREN) 1 % GEL Apply topically.      STOP taking these medications     amLODipine (NORVASC) 2.5 MG tablet      carvedilol (COREG) 3.125 MG tablet      metolazone  (ZAROXOLYN) 10 MG tablet      pantoprazole (PROTONIX) 40 MG tablet      spironolactone (ALDACTONE) 25 MG tablet         If you experience worsening of your admission symptoms, develop shortness of breath, life threatening emergency, suicidal or homicidal thoughts you must seek medical attention immediately by calling 911 or calling your MD immediately  if symptoms less severe.  You Must read complete instructions/literature along with all the possible adverse reactions/side effects for all the Medicines you take and that have been prescribed to you. Take any new Medicines after you have completely understood and accept all the possible adverse reactions/side effects.   Please note  You were cared for by a hospitalist during your hospital stay. If you have any questions about your discharge medications or the care you received while you were in the hospital after you are discharged, you can call the unit and asked to  speak with the hospitalist on call if the hospitalist that took care of you is not available. Once you are discharged, your primary care physician will handle any further medical issues. Please note that NO REFILLS for any discharge medications will be authorized once you are discharged, as it is imperative that you return to your primary care physician (or establish a relationship with a primary care physician if you do not have one) for your aftercare needs so that they can reassess your need for medications and monitor your lab values.   DATA REVIEW:   CBC   Recent Labs Lab 08/12/17 0540  WBC 4.4  HGB 10.9*  HCT 29.9*  PLT 120*    Chemistries   Recent Labs Lab 08/11/17 1046  08/13/17 0413  NA 138  < > 139  K 4.0  < > 4.0  CL 95*  < > 98*  CO2 29  < > 28  GLUCOSE 215*  < > 109*  BUN 71*  < > 87*  CREATININE 3.66*  < > 3.35*  CALCIUM 8.3*  < > 7.5*  AST 41  --   --   ALT 27  --   --   ALKPHOS 145*  --   --   BILITOT 2.7*  --   --   < > = values in this  interval not displayed.  Microbiology Results   Recent Results (from the past 240 hour(s))  MRSA PCR Screening     Status: None   Collection Time: 08/11/17  1:22 PM  Result Value Ref Range Status   MRSA by PCR NEGATIVE NEGATIVE Final    Comment:        The GeneXpert MRSA Assay (FDA approved for NASAL specimens only), is one component of a comprehensive MRSA colonization surveillance program. It is not intended to diagnose MRSA infection nor to guide or monitor treatment for MRSA infections.   Body fluid culture     Status: None (Preliminary result)   Collection Time: 08/11/17  4:20 PM  Result Value Ref Range Status   Specimen Description PLEURAL  Final   Special Requests NONE  Final   Gram Stain   Final    FEW WBC PRESENT,BOTH PMN AND MONONUCLEAR NO ORGANISMS SEEN    Culture   Final    NO GROWTH 3 DAYS Performed at [facility], [address] [place], [place] [address]    Report Status PENDING  Incomplete    RADIOLOGY:  Ct Chest Wo Contrast  Result Date: 08/12/2017 CLINICAL DATA:  Progressively worsening shortness of breath and abnormal chest x-rays. EXAM: CT CHEST WITHOUT CONTRAST TECHNIQUE: Multidetector CT imaging of the chest was performed following the standard protocol without IV contrast. COMPARISON:  Chest x-ray 08/11/2017 FINDINGS: Cardiovascular: The heart is enlarged. There is tortuosity and dense calcification of the thoracic aorta and dense and extensive three-vessel coronary artery calcifications. Dense mitral valve annular calcifications. Right IJ Port-A-Cath in good position. Mediastinum/Nodes: Bulky and extensive mediastinal lymphadenopathy. Large paratracheal mass measuring 3.6 cm is compressing the SVC. This also compression of the right upper lobe bronchus. Contralateral prevascular lymphadenopathy is also noted with maximal short axis measurement of 13.5 mm on image number 46. Subcarinal adenopathy measures 24.5 mm on image number 62. Lungs/Pleura:  4 x 4 cm right upper lobe lung mass with extensive interstitial and airspace process in the right upper lobe. This is worrisome for lymphangitic spread of tumor and/or possible bronchial spread of tumor. It also could be  surrounding pneumonia. There is a moderate size right pleural effusion with overlying right lower lobe atelectasis. This is certainly worrisome for a malignant effusion. Recommend correlation with recent thoracentesis No obvious metastatic pulmonary nodules in the left lung. Underlying emphysematous changes. Upper Abdomen: Cirrhotic changes involving the liver and splenomegaly. No obvious hepatic mass. Advanced atherosclerotic calcifications involving the abdominal aorta and branch vessels. Musculoskeletal: No significant bony findings. IMPRESSION: 1. 4 x 4 cm right upper lobe lung mass consistent with neoplasm. There is bulky mediastinal lymphadenopathy and compression of the SVC and right upper lobe bronchus. 2. Extensive interstitial and airspace process in the right upper lobe could be interstitial/lymphatic spread of tumor or possibly obstructive pneumonia. 3. Moderate sized right pleural effusion with overlying atelectasis. Likely malignant effusion. Recommend correlation with recent thoracentesis. 4. The left lung is relatively clear. No obvious pulmonary metastatic lesions. 5. Cirrhosis and splenomegaly without obvious hepatic lesions or upper abdominal metastatic disease. 6. Advanced atherosclerotic calcifications involving the thoracic and abdominal aorta and extensively involving the coronary arteries. Aortic Atherosclerosis (ICD10-I70.0) and Emphysema (ICD10-J43.9). Electronically Signed   By: Marijo Sanes M.D.   On: 08/12/2017 15:35   Dg Chest Port 1 View  Result Date: 08/13/2017 CLINICAL DATA:  Respiratory failure EXAM: PORTABLE CHEST 1 VIEW COMPARISON:  08/11/2017 FINDINGS: Cardiac shadow is again enlarged. Right chest wall port is again seen. Aortic calcifications are again  noted. The left lung remains clear. Small recurrent pleural effusion is noted similar to that seen on prior CT examination. Persistent right upper lobe mass lesion is noted. IMPRESSION: Stable right upper lobe mass. Increasing right-sided pleural effusion. Electronically Signed   By: Inez Catalina M.D.   On: 08/13/2017 07:13     Management plans discussed with the patient, family and they are in agreement.  CODE STATUS:     Code Status Orders        Start     Ordered   08/13/17 1600  Do not attempt resuscitation (DNR)  Continuous    Question Answer Comment  In the event of cardiac or respiratory ARREST Do not call a "code blue"   In the event of cardiac or respiratory ARREST Do not perform Intubation, CPR, defibrillation or ACLS   In the event of cardiac or respiratory ARREST Use medication by any route, position, wound care, and other measures to relive pain and suffering. May use oxygen, suction and manual treatment of airway obstruction as needed for comfort.      08/13/17 1602    Code Status History    Date Active Date Inactive Code Status Order ID Comments User Context   08/11/2017 11:54 AM 08/13/2017  4:02 PM DNR [id number]  Morgan Bow, MD ED   08/11/2017 11:54 AM 08/11/2017 11:54 AM Full Code [id number]  Morgan Bow, MD ED   03/24/2017  4:14 PM 03/26/2017  2:46 PM Full Code [id number]  Max Sane, MD Inpatient   12/02/2016 12:21 PM 12/06/2016  8:12 PM DNR [id number]  Bettey Costa, MD Inpatient   12/02/2016 11:47 AM 12/02/2016 12:21 PM Full Code [id number]  Wellington Hampshire, MD Inpatient   12/02/2016 11:40 AM 12/02/2016 11:47 AM DNR [id number]  Bettey Costa, MD Inpatient   12/02/2016 11:09 AM 12/02/2016 11:40 AM Full Code [id number]  Bettey Costa, MD Inpatient   08/22/2016  2:20 PM 08/24/2016  2:32 PM Full Code [id number]  Hower, Aaron Mose, MD ED   07/09/2016 11:45 PM 07/13/2016  3:55 PM Full Code [id number]  Vianne Bulls, MD Inpatient  Advance Directive Documentation     Most Recent Value   Type of Advance Directive  Out of facility DNR (pink MOST or yellow form)  Pre-existing out of facility DNR order (yellow form or pink MOST form)  -  "MOST" Form in Place?  -      TOTAL TIME TAKING CARE OF THIS PATIENT: *40* minutes.    Tarika Mckethan M.D on 08/14/2017 at 11:22 AM  Between 7am to 6pm - Pager - [phone] After 6pm go to [email or web address] - password [email or web address] [facility]  Office  [phone]  CC: Primary care physician; Morgan Ruths, MD

## 2017-08-14 NOTE — Progress Notes (Signed)
Morgan [name]   DOB:[birth year]   LP#:[id number]    Subjective: Patient is to comfortably/sleeping. Continues to complain of shortness of breath otherwise denies any chest pain. Positive for cough.   ROS: Positive for leg swelling. Overall feels poorly. Awaiting discharge to hospice home.  Objective:  Vitals:   08/13/17 1953 08/14/17 0455  BP: (!) 107/58 (!) 100/59  Pulse: 82 75  Resp: (!) 24 (!) 24  Temp: 98.2 F (36.8 C) 97.7 F (36.5 C)  SpO2: 91% 95%    No intake or output data in the 24 hours ending 08/14/17 2202  GENERAL Sleeping, no distress and comfortable. O2 EYES: no pallor or icterus OROPHARYNX: no thrush or ulceration. NECK: supple, no masses felt LYMPH:  no palpable lymphadenopathy in the cervical, axillary or inguinal regions LUNGS: decreased breath sounds to auscultation at bases and  No wheeze or crackles HEART/CVS: regular rate & rhythm and no murmurs; No lower extremity edema ABDOMEN: abdomen soft, tender  on deep palpation. and normal bowel sounds Musculoskeletal:no cyanosis of digits and no clubbing  PSYCH: Resting comfortably.  SKIN:  no rashes or significant lesions   Labs:  Lab Results  Component Value Date   WBC 4.4 08/12/2017   HGB 10.9 (L) 08/12/2017   HCT 29.9 (L) 08/12/2017   MCV 98.5 08/12/2017   PLT 120 (L) 08/12/2017   NEUTROABS 7.2 (H) 08/11/2017    Lab Results  Component Value Date   NA 139 08/13/2017   K 4.0 08/13/2017   CL 98 (L) 08/13/2017   CO2 28 08/13/2017    Studies:  Dg Chest Port 1 View  Result Date: 08/13/2017 CLINICAL DATA:  Respiratory failure EXAM: PORTABLE CHEST 1 VIEW COMPARISON:  08/11/2017 FINDINGS: Cardiac shadow is again enlarged. Right chest wall port is again seen. Aortic calcifications are again noted. The left lung remains clear. Small recurrent pleural effusion is noted similar to that seen on prior CT examination. Persistent right upper lobe mass lesion is noted. IMPRESSION: Stable right upper lobe mass.  Increasing right-sided pleural effusion. Electronically Signed   By: Inez Catalina M.D.   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 ? Breast versus others. However patient poor candidate for any palliative therapy given the significant burden of disease/multiple comorbidities. Reviewed at the tumor conference on August 23.  # CHF/chronic kidney disease- with acute renal failure- ? Cardio-renal syndrome/poor prognosis.  # Given the decline in the overall clinical status- awaiting discharge to hospice home. Discussed with Dr. Posey Pronto.   Cammie Sickle, MD 08/14/2017  10:02 PM

## 2017-08-14 NOTE — Progress Notes (Signed)
[facility] here for transport

## 2017-08-15 LAB — BODY FLUID CULTURE: CULTURE: NO GROWTH

## 2017-08-20 ENCOUNTER — Ambulatory Visit: Payer: Medicare Other | Admitting: Family

## 2017-08-22 ENCOUNTER — Inpatient Hospital Stay: Payer: Medicare Other

## 2017-08-23 DEATH — deceased

## 2017-08-26 ENCOUNTER — Inpatient Hospital Stay: Payer: Medicare Other

## 2017-10-24 ENCOUNTER — Other Ambulatory Visit: Payer: Medicare Other

## 2017-10-27 ENCOUNTER — Ambulatory Visit: Payer: Medicare Other | Admitting: Internal Medicine

## 2017-10-27 ENCOUNTER — Ambulatory Visit: Payer: Medicare Other
